# Patient Record
Sex: Male | Born: 1935
Health system: Southern US, Community
[De-identification: ages and names within clinical notes are randomized; demographics above are authoritative.]

## PROBLEM LIST (undated history)

## (undated) DIAGNOSIS — M5416 Radiculopathy, lumbar region: Secondary | ICD-10-CM

## (undated) DIAGNOSIS — I639 Cerebral infarction, unspecified: Secondary | ICD-10-CM

## (undated) DIAGNOSIS — M1990 Unspecified osteoarthritis, unspecified site: Secondary | ICD-10-CM

## (undated) DIAGNOSIS — R296 Repeated falls: Secondary | ICD-10-CM

## (undated) DIAGNOSIS — C61 Malignant neoplasm of prostate: Secondary | ICD-10-CM

## (undated) DIAGNOSIS — R29898 Other symptoms and signs involving the musculoskeletal system: Secondary | ICD-10-CM

## (undated) DIAGNOSIS — Z8679 Personal history of other diseases of the circulatory system: Secondary | ICD-10-CM

## (undated) DIAGNOSIS — F0390 Unspecified dementia without behavioral disturbance: Secondary | ICD-10-CM

## (undated) DIAGNOSIS — M549 Dorsalgia, unspecified: Secondary | ICD-10-CM

## (undated) DIAGNOSIS — I10 Essential (primary) hypertension: Secondary | ICD-10-CM

## (undated) DIAGNOSIS — G8929 Other chronic pain: Secondary | ICD-10-CM

## (undated) DIAGNOSIS — F03A0 Unspecified dementia, mild, without behavioral disturbance, psychotic disturbance, mood disturbance, and anxiety: Secondary | ICD-10-CM

## (undated) DIAGNOSIS — D126 Benign neoplasm of colon, unspecified: Secondary | ICD-10-CM

## (undated) DIAGNOSIS — G629 Polyneuropathy, unspecified: Secondary | ICD-10-CM

## (undated) DIAGNOSIS — Z973 Presence of spectacles and contact lenses: Secondary | ICD-10-CM

## (undated) DIAGNOSIS — E119 Type 2 diabetes mellitus without complications: Secondary | ICD-10-CM

## (undated) HISTORY — DX: Personal history of other diseases of the circulatory system: Z86.79

## (undated) HISTORY — PX: COLONOSCOPY: SHX174

## (undated) HISTORY — DX: Benign neoplasm of colon, unspecified: D12.6

## (undated) HISTORY — DX: Type 2 diabetes mellitus without complications: E11.9

## (undated) HISTORY — PX: OTHER SURGICAL HISTORY: SHX169

---

## 1980-07-07 DIAGNOSIS — I639 Cerebral infarction, unspecified: Secondary | ICD-10-CM

## 1980-07-07 HISTORY — DX: Cerebral infarction, unspecified: I63.9

## 1999-07-08 HISTORY — PX: INSERTION PROSTATE RADIATION SEED: SUR718

## 2000-08-25 ENCOUNTER — Encounter: Admission: RE | Admit: 2000-08-25 | Discharge: 2000-11-23 | Payer: Self-pay | Admitting: Radiation Oncology

## 2000-10-13 ENCOUNTER — Ambulatory Visit (HOSPITAL_BASED_OUTPATIENT_CLINIC_OR_DEPARTMENT_OTHER): Admission: RE | Admit: 2000-10-13 | Discharge: 2000-10-13 | Payer: Self-pay | Admitting: Urology

## 2000-10-13 ENCOUNTER — Encounter: Payer: Self-pay | Admitting: Urology

## 2000-12-08 ENCOUNTER — Ambulatory Visit: Admission: RE | Admit: 2000-12-08 | Discharge: 2001-03-08 | Payer: Self-pay | Admitting: Radiation Oncology

## 2001-08-11 ENCOUNTER — Ambulatory Visit (HOSPITAL_COMMUNITY): Admission: RE | Admit: 2001-08-11 | Discharge: 2001-08-11 | Payer: Self-pay | Admitting: *Deleted

## 2002-09-27 ENCOUNTER — Other Ambulatory Visit: Admission: RE | Admit: 2002-09-27 | Discharge: 2002-09-27 | Payer: Self-pay | Admitting: Dermatology

## 2003-02-20 ENCOUNTER — Other Ambulatory Visit: Admission: RE | Admit: 2003-02-20 | Discharge: 2003-02-20 | Payer: Self-pay | Admitting: Dermatology

## 2003-03-30 ENCOUNTER — Emergency Department (HOSPITAL_COMMUNITY): Admission: EM | Admit: 2003-03-30 | Discharge: 2003-03-30 | Payer: Self-pay | Admitting: Emergency Medicine

## 2003-03-30 ENCOUNTER — Encounter: Payer: Self-pay | Admitting: Emergency Medicine

## 2008-06-22 ENCOUNTER — Ambulatory Visit (HOSPITAL_COMMUNITY): Admission: RE | Admit: 2008-06-22 | Discharge: 2008-06-22 | Payer: Self-pay | Admitting: Internal Medicine

## 2008-06-22 ENCOUNTER — Encounter (INDEPENDENT_AMBULATORY_CARE_PROVIDER_SITE_OTHER): Payer: Self-pay | Admitting: Internal Medicine

## 2008-07-07 HISTORY — PX: EYE SURGERY: SHX253

## 2008-08-22 ENCOUNTER — Ambulatory Visit (HOSPITAL_COMMUNITY): Admission: RE | Admit: 2008-08-22 | Discharge: 2008-08-23 | Payer: Self-pay | Admitting: Ophthalmology

## 2008-12-12 HISTORY — PX: MOHS SURGERY: SUR867

## 2009-11-14 ENCOUNTER — Ambulatory Visit (HOSPITAL_COMMUNITY): Admission: RE | Admit: 2009-11-14 | Discharge: 2009-11-14 | Payer: Self-pay | Admitting: Internal Medicine

## 2010-07-07 DIAGNOSIS — R296 Repeated falls: Secondary | ICD-10-CM

## 2010-07-07 HISTORY — DX: Repeated falls: R29.6

## 2010-10-22 LAB — BASIC METABOLIC PANEL
CO2: 31 mEq/L (ref 19–32)
Calcium: 9.5 mg/dL (ref 8.4–10.5)
Creatinine, Ser: 0.6 mg/dL (ref 0.4–1.5)
GFR calc Af Amer: >60 mL/min (ref >60)
GFR calc non Af Amer: >60 mL/min (ref >60)
Sodium: 132 mEq/L — ABNORMAL LOW (ref 135–145)

## 2010-10-22 LAB — GLUCOSE, CAPILLARY
Glucose-Capillary: 150 mg/dL — ABNORMAL HIGH (ref 70–99)
Glucose-Capillary: 157 mg/dL — ABNORMAL HIGH (ref 70–99)
Glucose-Capillary: 177 mg/dL — ABNORMAL HIGH (ref 70–99)
Glucose-Capillary: 179 mg/dL — ABNORMAL HIGH (ref 70–99)
Glucose-Capillary: 190 mg/dL — ABNORMAL HIGH (ref 70–99)
Glucose-Capillary: 204 mg/dL — ABNORMAL HIGH (ref 70–99)

## 2010-10-22 LAB — CBC
Hemoglobin: 14.8 g/dL (ref 13.0–17.0)
MCHC: 35 g/dL (ref 30.0–36.0)
RBC: 4.04 MIL/uL — ABNORMAL LOW (ref 4.22–5.81)
WBC: 8.4 10*3/uL (ref 4.0–10.5)

## 2010-11-19 NOTE — Op Note (Signed)
Stephen Bates, Stephen Bates              ACCOUNT NO.:  [id number]   MEDICAL RECORD NO.:  [id number]          PATIENT TYPE:  OIB   LOCATION:  5154                         FACILITY:  [facility]   PHYSICIAN:  Beulah Gandy. Ashley Royalty, M.D. DATE OF BIRTH:  04-14-1927   DATE OF PROCEDURE:  08/22/2008  DATE OF DISCHARGE:                               OPERATIVE REPORT   ADMISSION DIAGNOSES:  1. Retained lens material, capsular fibrosis, and posterior membranes      in the right eye.  2. Diabetic retinopathy, right eye.  3. Retinal break at 6 o'clock.   PROCEDURES:  Pars plana vitrectomy with 25-gauge system, retinal  photocoagulation, membrane peel, posterior capsulectomy, removal of  retained lens material in the right eye.   SURGEON:  Beulah Gandy. Ashley Royalty, MD   ASSISTANT:  Rosalie Doctor, MA   ANESTHESIA:  General.   DETAILS:  Usual prep and drape, 25-gauge trocars were placed at 8, 10,  and 2 o'clock with infusion at 8 o'clock.  The lighted pick and the  cutter were placed at 10 and 2 o'clock respectively.  Lens fragments  were removed from around the intraocular lens and some capsular remnants  were removed to improve the visualization of the vitrectomy.  The  footplates of the IOL were found to be intact and tight and secure.  The  vitrectomy was carried posteriorly and large white lens fragments were  encountered.  These were carefully removed under low suction and rapid  cutting.  The vitrectomy was carried down to the vitreous base for 360  degrees.  Lens material along with pigment and vitreous was seen, this  was carefully removed under low suction.  A break was seen at 6 o'clock.  The indirect ophthalmoscope laser was moved into place, 624 burns were  placed around the retinal break and around the retinal periphery.  Power  was 500 milliwatts, 1000 microns each and 0.1 seconds each.  Additional  vitrectomy was carried out in the periphery where membranes were seen.  These were peeled with the  25-gauge pick and removed with the vitreous  cutter.  Some fragments of lens material were seen in these membranes.  A 27-gauge needle was passed through the 12 o'clock limbus to remove  lens remnants from the anterior chamber angle.  All remnants were  removed.  The vitreous infusion fluid was allowed to rinse the vitreous  cavity for several minutes under high mag visualization.  All particles  were removed and none were remaining when the vitrectomy was stopped.  The instruments were removed from the eye and the trocars were removed  from the eye.  The wounds were tested and found to be tight.  Polymyxin  and gentamicin were irrigated into [place] space.  Marcaine was injected  around the globe for postop pain.  Decadron 10 mg was injected into the  lower subconjunctival space.  Polysporin ophthalmic ointment, patch and  shield were placed.  Closing pressure was 10 with a Barraquer tonometer.   COMPLICATIONS:  None.   DURATION:  1 hour.      Beulah Gandy. Ashley Royalty, M.D.  Electronically  Signed     JDM/MEDQ  D:  08/22/2008  T:  08/23/2008  Job:  [id number]

## 2010-11-19 NOTE — Op Note (Signed)
Stephen Bates, Stephen Bates              ACCOUNT NO.:  [id number]   MEDICAL RECORD NO.:  [id number]          PATIENT TYPE:  AMB   LOCATION:  DAY                           FACILITY:  [facility]   PHYSICIAN:  Lionel December, M.D.    DATE OF BIRTH:  1927-05-18   DATE OF PROCEDURE:  DATE OF DISCHARGE:                               OPERATIVE REPORT   PROCEDURE:  Colonoscopy.   INDICATIONS:  Espiridion is a 77 year old Caucasian male who is undergoing  HIDA screening colonoscopy for he had a sister who died of colon  carcinoma in her 51s.  The patient's last exam was in 2003 and was  normal.  Procedure and risks were reviewed with the patient and an  informed consent was obtained.   MEDS FOR CONSCIOUS SEDATION:  Demerol 25 mg IV, and Versed 3 mg IV.   FINDINGS:  Procedure performed in endoscopy suite.  The patient's vital  signs and O2 sat were monitored during the procedure and remained  stable.  The patient was placed in left lateral recumbent position,  rectal examination performed.  No abnormality noted on external or  digital exam.  Pentax videoscope was placed in the rectum and advanced  under vision into the sigmoid colon and beyond.  Preparation was  satisfactory.  Scope was passed into the cecum, which was identified by  appendiceal orifice and ileocecal valve.  There was 6-mm polyp to the  left appendiceal orifice.  It was easily ablated via cold biopsy.  Minimal oozing was observed for few minutes then oozing stopped  completely.  The area was washed as well.  As the scope was withdrawn,  the rest of the colonic mucosa was carefully examined.  There was  another 4-mm polyp at splenic flexure, which was ablated via cold  biopsy.  Mucosa and rest of the colon was normal.  Rectal mucosa  similarly was normal.  The scope was retroflexed to examine anorectal  junction, which was unremarkable.  Endoscope was straightened and  withdrawn.  The patient tolerated the procedure well.   FINAL  DIAGNOSES:  1. Examination performed cecum.  2. Small polyps ablated via cold biopsy, one from the cecum and second      one from splenic flexure.   RECOMMENDATIONS:  Standard instructions given.  He will resume his  aspirin on June 26, 2008.  I will be contacting the patient with  results of biopsy and further recommendations.      Lionel December, M.D.  Electronically Signed     NR/MEDQ  D:  06/22/2008  T:  06/23/2008  Job:  [id number]   cc:   Kingsley Callander. Ouida Sills, MD  Fax: [phone]

## 2010-11-22 NOTE — Op Note (Signed)
[place]  Patient:    Stephen Bates, Stephen Bates                     MRN: [id number] Proc. Date: 10/13/00 Adm. Date:  [id number] Attending:  Nelma Rothman Iii                           Operative Report  PREOPERATIVE DIAGNOSIS:  Adenocarcinoma of the prostate.  POSTOPERATIVE DIAGNOSIS:  Adenocarcinoma of the prostate.  OPERATION PERFORMED:  Transperineal implantation of iodine-125 seeds into the prostate and flexible cystourethroscopy.  SURGEON:  Lucrezia Starch. Ovidio Hanger, M.D.  ASSISTANT:  Wynn Banker, M.D.  ANESTHESIA:  General endotracheal.  ESTIMATED BLOOD LOSS:  15 cc.  DRAINS:  16 French Foley catheter.  COMPLICATIONS:  None.  A total of 112 seeds were delivered with 31 needles at 0.311 mCi per seed and Vicryl strands were utilized.  INDICATIONS FOR PROCEDURE:  The patient is a very nice 77 year old male who presented with an elevated PSA of 6.1 with 11% free to total ratio.  He subsequently underwent transrectal ultrasound and biopsy of the prostate which revealed a Gleason score 5 which was 2+3 adenocarcinoma of the left side of the prostate.  He has considered all options and after understanding the risks, benefits and alternatives and being properly simulated and properly informed, he has elected to proceed with radioactive seed implantation.  DESCRIPTION OF PROCEDURE:  The patient was placed in supine position.  After proper general endotracheal anesthesia, he was placed in dorsal lithotomy position and prepped and draped with Betadine in sterile fashion.  A 16 French Foley catheter was inserted and inflated with 10 cc of contrast solution.  The Siemens transrectal ultrasound probe was placed on the [facility] stabilization device and localized to preplanned coordinates.  A 16 French red rubber catheter was placed into the rectum to vent flatus.  A reference plane of 2 cm from the base was utilized for implantation.  Fluoroscopy  was then placed and localized.  The electronic and mechanical grid were utilized and two holding needles were placed in unused coordinates and proper measurements were taken. Utilizing preplanned coordinates, serial implantations were performed and utilizing the grid technique, a total of 112 seeds were implanted with 31 needles at 0.311 mCi per seed.  We were very comfortable with the seed implantation and localization both fluoroscopically and with ultrasound. Following implantation, the holding needles were removed along with the grid. Static images were obtained fluoroscopically with and without the ultrasound probe in place.  The wound was dressed sterilely and the patient was placed in supine position.  The Foley catheter was removed and scanned for seeds and there were none within it.  Flexible cystourethroscopy was then performed with an Olympus flexible cystourethroscope and he was noted to have mild trilobar hypertrophy, grade 1 trabeculation was noted.  Efflux of clear urine was noted from the normally placed ureteral orifices bilaterally and there were no seeds or strands within the bladder or within the urethra.  Following cystourethroscopy, a new 16 French Foley catheter was inserted, the urine drained clear and the patient was taken to the recovery room stable. DD:  10/13/00 TD:  10/13/00 Job: [id number]

## 2011-03-05 ENCOUNTER — Other Ambulatory Visit (HOSPITAL_COMMUNITY): Payer: Self-pay | Admitting: Internal Medicine

## 2011-03-05 ENCOUNTER — Ambulatory Visit (HOSPITAL_COMMUNITY)
Admission: RE | Admit: 2011-03-05 | Discharge: 2011-03-05 | Disposition: A | Discharge status: Home / Self Care | Payer: Medicare Other | Source: Ambulatory Visit | Attending: Internal Medicine | Admitting: Internal Medicine

## 2011-03-05 DIAGNOSIS — M899 Disorder of bone, unspecified: Secondary | ICD-10-CM | POA: Insufficient documentation

## 2011-03-05 DIAGNOSIS — M25551 Pain in right hip: Secondary | ICD-10-CM

## 2011-03-05 DIAGNOSIS — M545 Low back pain, unspecified: Secondary | ICD-10-CM | POA: Insufficient documentation

## 2011-03-05 DIAGNOSIS — M5137 Other intervertebral disc degeneration, lumbosacral region: Secondary | ICD-10-CM | POA: Insufficient documentation

## 2011-03-05 DIAGNOSIS — M51379 Other intervertebral disc degeneration, lumbosacral region without mention of lumbar back pain or lower extremity pain: Secondary | ICD-10-CM | POA: Insufficient documentation

## 2011-03-05 DIAGNOSIS — M949 Disorder of cartilage, unspecified: Secondary | ICD-10-CM | POA: Insufficient documentation

## 2011-03-05 DIAGNOSIS — M25559 Pain in unspecified hip: Secondary | ICD-10-CM | POA: Insufficient documentation

## 2011-03-27 ENCOUNTER — Ambulatory Visit: Payer: Medicare Other | Admitting: Orthopedic Surgery

## 2011-04-08 ENCOUNTER — Ambulatory Visit: Payer: Medicare Other | Admitting: Orthopedic Surgery

## 2011-07-25 DIAGNOSIS — M5430 Sciatica, unspecified side: Secondary | ICD-10-CM | POA: Diagnosis not present

## 2011-08-07 DIAGNOSIS — G894 Chronic pain syndrome: Secondary | ICD-10-CM | POA: Diagnosis not present

## 2011-08-07 DIAGNOSIS — M5137 Other intervertebral disc degeneration, lumbosacral region: Secondary | ICD-10-CM | POA: Diagnosis not present

## 2011-08-07 DIAGNOSIS — IMO0002 Reserved for concepts with insufficient information to code with codable children: Secondary | ICD-10-CM | POA: Diagnosis not present

## 2011-08-18 ENCOUNTER — Other Ambulatory Visit (HOSPITAL_COMMUNITY): Payer: Self-pay | Admitting: Physical Medicine and Rehabilitation

## 2011-08-18 ENCOUNTER — Ambulatory Visit (HOSPITAL_COMMUNITY)
Admission: RE | Admit: 2011-08-18 | Discharge: 2011-08-18 | Disposition: A | Discharge status: Home / Self Care | Payer: Medicare Other | Source: Ambulatory Visit | Attending: Physical Medicine and Rehabilitation | Admitting: Physical Medicine and Rehabilitation

## 2011-08-18 DIAGNOSIS — M545 Low back pain, unspecified: Secondary | ICD-10-CM | POA: Insufficient documentation

## 2011-08-18 DIAGNOSIS — M169 Osteoarthritis of hip, unspecified: Secondary | ICD-10-CM | POA: Diagnosis not present

## 2011-08-18 DIAGNOSIS — IMO0002 Reserved for concepts with insufficient information to code with codable children: Secondary | ICD-10-CM | POA: Insufficient documentation

## 2011-08-18 DIAGNOSIS — M25559 Pain in unspecified hip: Secondary | ICD-10-CM | POA: Diagnosis not present

## 2011-08-18 DIAGNOSIS — M549 Dorsalgia, unspecified: Secondary | ICD-10-CM

## 2011-08-18 DIAGNOSIS — M25551 Pain in right hip: Secondary | ICD-10-CM

## 2011-08-18 DIAGNOSIS — W19XXXA Unspecified fall, initial encounter: Secondary | ICD-10-CM

## 2011-08-18 DIAGNOSIS — M4310 Spondylolisthesis, site unspecified: Secondary | ICD-10-CM | POA: Diagnosis not present

## 2011-08-18 DIAGNOSIS — M1610 Unilateral primary osteoarthritis, unspecified hip: Secondary | ICD-10-CM | POA: Insufficient documentation

## 2011-08-28 DIAGNOSIS — M48061 Spinal stenosis, lumbar region without neurogenic claudication: Secondary | ICD-10-CM | POA: Diagnosis not present

## 2011-08-28 DIAGNOSIS — M5430 Sciatica, unspecified side: Secondary | ICD-10-CM | POA: Diagnosis not present

## 2011-08-28 DIAGNOSIS — M5137 Other intervertebral disc degeneration, lumbosacral region: Secondary | ICD-10-CM | POA: Diagnosis not present

## 2011-09-02 DIAGNOSIS — I10 Essential (primary) hypertension: Secondary | ICD-10-CM | POA: Diagnosis not present

## 2011-09-08 DIAGNOSIS — M5137 Other intervertebral disc degeneration, lumbosacral region: Secondary | ICD-10-CM | POA: Diagnosis not present

## 2011-09-08 DIAGNOSIS — M76899 Other specified enthesopathies of unspecified lower limb, excluding foot: Secondary | ICD-10-CM | POA: Diagnosis not present

## 2011-09-08 DIAGNOSIS — M5430 Sciatica, unspecified side: Secondary | ICD-10-CM | POA: Diagnosis not present

## 2011-09-11 DIAGNOSIS — M76899 Other specified enthesopathies of unspecified lower limb, excluding foot: Secondary | ICD-10-CM | POA: Diagnosis not present

## 2011-09-11 DIAGNOSIS — M5430 Sciatica, unspecified side: Secondary | ICD-10-CM | POA: Diagnosis not present

## 2011-09-11 DIAGNOSIS — E119 Type 2 diabetes mellitus without complications: Secondary | ICD-10-CM | POA: Diagnosis not present

## 2011-09-30 DIAGNOSIS — M545 Low back pain, unspecified: Secondary | ICD-10-CM | POA: Diagnosis not present

## 2011-09-30 DIAGNOSIS — IMO0002 Reserved for concepts with insufficient information to code with codable children: Secondary | ICD-10-CM | POA: Diagnosis not present

## 2011-11-04 DIAGNOSIS — M545 Low back pain, unspecified: Secondary | ICD-10-CM | POA: Diagnosis not present

## 2011-11-04 DIAGNOSIS — IMO0002 Reserved for concepts with insufficient information to code with codable children: Secondary | ICD-10-CM | POA: Diagnosis not present

## 2011-11-04 DIAGNOSIS — M47817 Spondylosis without myelopathy or radiculopathy, lumbosacral region: Secondary | ICD-10-CM | POA: Diagnosis not present

## 2011-12-02 ENCOUNTER — Encounter (INDEPENDENT_AMBULATORY_CARE_PROVIDER_SITE_OTHER): Payer: Self-pay

## 2011-12-02 DIAGNOSIS — IMO0002 Reserved for concepts with insufficient information to code with codable children: Secondary | ICD-10-CM | POA: Diagnosis not present

## 2011-12-02 DIAGNOSIS — M545 Low back pain, unspecified: Secondary | ICD-10-CM | POA: Diagnosis not present

## 2011-12-12 DIAGNOSIS — E119 Type 2 diabetes mellitus without complications: Secondary | ICD-10-CM | POA: Diagnosis not present

## 2011-12-12 DIAGNOSIS — Z79899 Other long term (current) drug therapy: Secondary | ICD-10-CM | POA: Diagnosis not present

## 2011-12-12 DIAGNOSIS — Z125 Encounter for screening for malignant neoplasm of prostate: Secondary | ICD-10-CM | POA: Diagnosis not present

## 2011-12-12 DIAGNOSIS — I10 Essential (primary) hypertension: Secondary | ICD-10-CM | POA: Diagnosis not present

## 2011-12-12 DIAGNOSIS — I6789 Other cerebrovascular disease: Secondary | ICD-10-CM | POA: Diagnosis not present

## 2011-12-18 DIAGNOSIS — I6789 Other cerebrovascular disease: Secondary | ICD-10-CM | POA: Diagnosis not present

## 2011-12-18 DIAGNOSIS — E119 Type 2 diabetes mellitus without complications: Secondary | ICD-10-CM | POA: Diagnosis not present

## 2011-12-18 DIAGNOSIS — I10 Essential (primary) hypertension: Secondary | ICD-10-CM | POA: Diagnosis not present

## 2011-12-18 DIAGNOSIS — I4510 Unspecified right bundle-branch block: Secondary | ICD-10-CM | POA: Diagnosis not present

## 2012-02-04 DIAGNOSIS — Z85828 Personal history of other malignant neoplasm of skin: Secondary | ICD-10-CM | POA: Diagnosis not present

## 2012-02-04 DIAGNOSIS — L570 Actinic keratosis: Secondary | ICD-10-CM | POA: Diagnosis not present

## 2012-04-08 DIAGNOSIS — E119 Type 2 diabetes mellitus without complications: Secondary | ICD-10-CM | POA: Diagnosis not present

## 2012-04-15 DIAGNOSIS — E119 Type 2 diabetes mellitus without complications: Secondary | ICD-10-CM | POA: Diagnosis not present

## 2012-04-15 DIAGNOSIS — I10 Essential (primary) hypertension: Secondary | ICD-10-CM | POA: Diagnosis not present

## 2012-05-10 DIAGNOSIS — Z23 Encounter for immunization: Secondary | ICD-10-CM | POA: Diagnosis not present

## 2012-05-24 DIAGNOSIS — R972 Elevated prostate specific antigen [PSA]: Secondary | ICD-10-CM | POA: Diagnosis not present

## 2012-05-24 DIAGNOSIS — N320 Bladder-neck obstruction: Secondary | ICD-10-CM | POA: Diagnosis not present

## 2012-05-24 DIAGNOSIS — C61 Malignant neoplasm of prostate: Secondary | ICD-10-CM | POA: Diagnosis not present

## 2012-06-22 DIAGNOSIS — C4442 Squamous cell carcinoma of skin of scalp and neck: Secondary | ICD-10-CM | POA: Diagnosis not present

## 2012-06-22 DIAGNOSIS — D485 Neoplasm of uncertain behavior of skin: Secondary | ICD-10-CM | POA: Diagnosis not present

## 2012-06-22 DIAGNOSIS — L570 Actinic keratosis: Secondary | ICD-10-CM | POA: Diagnosis not present

## 2012-07-20 DIAGNOSIS — C4492 Squamous cell carcinoma of skin, unspecified: Secondary | ICD-10-CM | POA: Diagnosis not present

## 2012-07-20 DIAGNOSIS — H1130 Conjunctival hemorrhage, unspecified eye: Secondary | ICD-10-CM | POA: Diagnosis not present

## 2012-07-20 DIAGNOSIS — C4442 Squamous cell carcinoma of skin of scalp and neck: Secondary | ICD-10-CM | POA: Diagnosis not present

## 2012-08-12 DIAGNOSIS — E119 Type 2 diabetes mellitus without complications: Secondary | ICD-10-CM | POA: Diagnosis not present

## 2012-09-09 DIAGNOSIS — E119 Type 2 diabetes mellitus without complications: Secondary | ICD-10-CM | POA: Diagnosis not present

## 2012-09-09 DIAGNOSIS — R0602 Shortness of breath: Secondary | ICD-10-CM | POA: Diagnosis not present

## 2012-09-13 ENCOUNTER — Ambulatory Visit (HOSPITAL_COMMUNITY)
Admission: RE | Admit: 2012-09-13 | Discharge: 2012-09-13 | Disposition: A | Discharge status: Home / Self Care | Payer: Medicare Other | Source: Ambulatory Visit | Attending: Internal Medicine | Admitting: Internal Medicine

## 2012-09-13 ENCOUNTER — Other Ambulatory Visit (HOSPITAL_COMMUNITY): Payer: Self-pay | Admitting: Internal Medicine

## 2012-09-13 DIAGNOSIS — R0602 Shortness of breath: Secondary | ICD-10-CM | POA: Diagnosis not present

## 2012-09-23 ENCOUNTER — Encounter (INDEPENDENT_AMBULATORY_CARE_PROVIDER_SITE_OTHER): Payer: Medicare Other | Admitting: Ophthalmology

## 2012-10-13 ENCOUNTER — Ambulatory Visit (INDEPENDENT_AMBULATORY_CARE_PROVIDER_SITE_OTHER): Payer: Medicare Other | Admitting: Ophthalmology

## 2012-10-13 DIAGNOSIS — H2700 Aphakia, unspecified eye: Secondary | ICD-10-CM

## 2012-10-14 ENCOUNTER — Ambulatory Visit (INDEPENDENT_AMBULATORY_CARE_PROVIDER_SITE_OTHER): Payer: Medicare Other | Admitting: Ophthalmology

## 2012-12-07 DIAGNOSIS — L570 Actinic keratosis: Secondary | ICD-10-CM | POA: Diagnosis not present

## 2012-12-07 DIAGNOSIS — L408 Other psoriasis: Secondary | ICD-10-CM | POA: Diagnosis not present

## 2012-12-07 DIAGNOSIS — D485 Neoplasm of uncertain behavior of skin: Secondary | ICD-10-CM | POA: Diagnosis not present

## 2012-12-07 DIAGNOSIS — Z85828 Personal history of other malignant neoplasm of skin: Secondary | ICD-10-CM | POA: Diagnosis not present

## 2012-12-07 DIAGNOSIS — L905 Scar conditions and fibrosis of skin: Secondary | ICD-10-CM | POA: Diagnosis not present

## 2012-12-07 DIAGNOSIS — L98499 Non-pressure chronic ulcer of skin of other sites with unspecified severity: Secondary | ICD-10-CM | POA: Diagnosis not present

## 2012-12-10 ENCOUNTER — Encounter (HOSPITAL_COMMUNITY): Payer: Self-pay | Admitting: *Deleted

## 2012-12-10 ENCOUNTER — Emergency Department (HOSPITAL_COMMUNITY)
Admission: EM | Admit: 2012-12-10 | Discharge: 2012-12-10 | Disposition: A | Discharge status: Home / Self Care | Payer: Medicare Other | Attending: Emergency Medicine | Admitting: Emergency Medicine

## 2012-12-10 ENCOUNTER — Emergency Department (HOSPITAL_COMMUNITY): Payer: Medicare Other

## 2012-12-10 DIAGNOSIS — S298XXA Other specified injuries of thorax, initial encounter: Secondary | ICD-10-CM | POA: Diagnosis not present

## 2012-12-10 DIAGNOSIS — S51009A Unspecified open wound of unspecified elbow, initial encounter: Secondary | ICD-10-CM | POA: Insufficient documentation

## 2012-12-10 DIAGNOSIS — S51011A Laceration without foreign body of right elbow, initial encounter: Secondary | ICD-10-CM

## 2012-12-10 DIAGNOSIS — Z8546 Personal history of malignant neoplasm of prostate: Secondary | ICD-10-CM | POA: Diagnosis not present

## 2012-12-10 DIAGNOSIS — Z87891 Personal history of nicotine dependence: Secondary | ICD-10-CM | POA: Insufficient documentation

## 2012-12-10 DIAGNOSIS — S0100XA Unspecified open wound of scalp, initial encounter: Secondary | ICD-10-CM | POA: Diagnosis not present

## 2012-12-10 DIAGNOSIS — I10 Essential (primary) hypertension: Secondary | ICD-10-CM | POA: Diagnosis not present

## 2012-12-10 DIAGNOSIS — Y939 Activity, unspecified: Secondary | ICD-10-CM | POA: Insufficient documentation

## 2012-12-10 DIAGNOSIS — IMO0002 Reserved for concepts with insufficient information to code with codable children: Secondary | ICD-10-CM | POA: Diagnosis not present

## 2012-12-10 DIAGNOSIS — Y92009 Unspecified place in unspecified non-institutional (private) residence as the place of occurrence of the external cause: Secondary | ICD-10-CM | POA: Insufficient documentation

## 2012-12-10 DIAGNOSIS — Z23 Encounter for immunization: Secondary | ICD-10-CM | POA: Insufficient documentation

## 2012-12-10 DIAGNOSIS — Z79899 Other long term (current) drug therapy: Secondary | ICD-10-CM | POA: Diagnosis not present

## 2012-12-10 DIAGNOSIS — S51012A Laceration without foreign body of left elbow, initial encounter: Secondary | ICD-10-CM

## 2012-12-10 DIAGNOSIS — Z7982 Long term (current) use of aspirin: Secondary | ICD-10-CM | POA: Diagnosis not present

## 2012-12-10 DIAGNOSIS — S0101XA Laceration without foreign body of scalp, initial encounter: Secondary | ICD-10-CM

## 2012-12-10 DIAGNOSIS — W1809XA Striking against other object with subsequent fall, initial encounter: Secondary | ICD-10-CM | POA: Insufficient documentation

## 2012-12-10 HISTORY — DX: Malignant neoplasm of prostate: C61

## 2012-12-10 HISTORY — DX: Essential (primary) hypertension: I10

## 2012-12-10 MED ORDER — LIDOCAINE HCL (PF) 2 % IJ SOLN
INTRAMUSCULAR | Status: AC
  Administered 2012-12-10: 5 mL
  Filled 2012-12-10: qty 10

## 2012-12-10 MED ORDER — TETANUS-DIPHTH-ACELL PERTUSSIS 5-2.5-18.5 LF-MCG/0.5 IM SUSP
0.5000 mL | Freq: Once | INTRAMUSCULAR | Status: AC
  Administered 2012-12-10: 0.5 mL via INTRAMUSCULAR
  Filled 2012-12-10: qty 0.5

## 2012-12-10 NOTE — ED Notes (Signed)
States he takes 81mg  ASA everyday

## 2012-12-10 NOTE — ED Notes (Signed)
Reports tripped and fell on a "high" part of his driveway, falling backwards; abrasion to left elbow noted - cleansed and dressed with telfa and kling; skin tear to right elbow.  Laceration to back of head, bleeding controlled.  Denies LOC.  A&ox4; answers questions appropriately.

## 2012-12-10 NOTE — ED Provider Notes (Signed)
History     CSN: [id number]  Arrival date & time 12/10/12  1628   First MD Initiated Contact with Patient 12/10/12 1704      Chief Complaint  Patient presents with  . Head Injury    (Consider location/radiation/quality/duration/timing/severity/associated sxs/prior treatment) Patient is a 79 y.o. male presenting with head injury.  Head Injury Head/neck injury location: Pt tripped on a high place in his driveway and fell backwards, suffering a laceration of the occipital scalp and skin tears of both elbows.  Time since incident: Approximately one hour. Mechanism of injury: fall   Pain details:    Quality:  Dull   Radiates to: Does not radiate.   Severity:  Mild   Duration: Appx one hour.    Timing:  Constant   Progression:  Unchanged Chronicity:  New Relieved by:  Nothing Worsened by:  Nothing tried Ineffective treatments:  None tried Risk factors: being elderly     Past Medical History  Diagnosis Date  . Hypertension   . Prostate cancer     History reviewed. No pertinent past surgical history.  History reviewed. No pertinent family history.  History  Substance Use Topics  . Smoking status: Former Games developer  . Smokeless tobacco: Not on file  . Alcohol Use: Yes      Review of Systems  Constitutional: Negative.  Negative for fever and chills.  HENT: Negative.   Eyes: Negative.   Respiratory: Negative.   Cardiovascular: Negative.   Gastrointestinal: Negative.   Genitourinary: Negative.   Musculoskeletal: Positive for back pain.  Skin: Positive for wound.  Neurological: Negative.        No loss of consciousness.  Psychiatric/Behavioral: Negative.     Allergies  Review of patient's allergies indicates no known allergies.  Home Medications   Current Outpatient Rx  Name  Route  Sig  Dispense  Refill  . aspirin EC 81 MG tablet   Oral   Take 81 mg by mouth daily.         . cholecalciferol (VITAMIN D) 1000 UNITS tablet   Oral   Take 1,000 Units by  mouth daily.         . hydrochlorothiazide (HYDRODIURIL) 25 MG tablet   Oral   Take 25 mg by mouth daily.         Marland Kitchen olmesartan (BENICAR) 40 MG tablet   Oral   Take 40 mg by mouth daily.         Bertram Gala Glycol-Propyl Glycol (LUBRICANT EYE DROPS) 0.4-0.3 % SOLN   Ophthalmic   Apply 1 drop to eye daily as needed (for dry eye relief).         . tamsulosin (FLOMAX) 0.4 MG CAPS   Oral   Take 0.4 mg by mouth at bedtime.            BP 151/57  Pulse 68  Temp(Src) 98.8 F (37.1 C) (Oral)  Resp 20  Ht 5\' 4"  (1.626 m)  Wt 140 lb (63.504 kg)  BMI 24.02 kg/m2  SpO2 98%  Physical Exam  Nursing note and vitals reviewed. Constitutional: He is oriented to person, place, and time. He appears well-developed and well-nourished.  In mild distress with lacerations of scalp, skin tears on both elbows.  HENT:  Head: Normocephalic.  Right Ear: External ear normal.  Left Ear: External ear normal.  Mouth/Throat: Oropharynx is clear and moist.  He has a 4 cm jagged laceration of the occipital scalp, in the sagittal plane, no foreign body,  no skull defect.  Eyes: Conjunctivae and EOM are normal. Pupils are equal, round, and reactive to light.  Neck: Normal range of motion. Neck supple.  Cardiovascular: Normal rate, regular rhythm and normal heart sounds.   Pulmonary/Chest: Effort normal and breath sounds normal.  Abdominal: Soft. Bowel sounds are normal.  Musculoskeletal: Normal range of motion. He exhibits no edema and no tenderness.  Neurological: He is alert and oriented to person, place, and time.  No sensory or motor deficit.  Skin:  He has superficial skin tears on both elbows, each about 2 cm long, no foreign body in the wounds. The elbows have full ROM, no bony deformity.  He has intact pulses, sensation and tendon function in both arms.  Psychiatric: He has a normal mood and affect. His behavior is normal.    ED Course  LACERATION REPAIR Date/Time: 12/11/2012 11:34  AM Performed by: Osvaldo Human Authorized by: Osvaldo Human Consent: Verbal consent obtained. Risks and benefits: risks, benefits and alternatives were discussed Consent given by: patient Patient understanding: patient states understanding of the procedure being performed Patient consent: the patient's understanding of the procedure matches consent given Site marked: the operative site was not marked Patient identity confirmed: verbally with patient Body area: head/neck Location details: scalp Laceration length: 4 cm Foreign bodies: no foreign bodies Tendon involvement: none Nerve involvement: none Vascular damage: no Anesthesia: local infiltration Local anesthetic: lidocaine 2% without epinephrine Patient sedated: no Preparation: Patient was prepped and draped in the usual sterile fashion. Irrigation solution: saline Irrigation method: tap Amount of cleaning: standard Debridement: minimal (The scalp had a skin tear that was a part of the laceration.  The dead skin overlying the laceration was debrided off, to expose the wound.) Degree of undermining: none Skin closure: staples Number of sutures: 13 Approximation difficulty: complex Patient tolerance: Patient tolerated the procedure well with no immediate complications. Comments: Pt's elbow skin tears were washed with saline, the skin tear flaps laid over the underlying skin, and were Steri-Stripped and then dressed with 4 x 4's and Kling.   (including critical care time)  Labs Reviewed - No data to display Dg Chest 2 View  12/10/2012   *RADIOLOGY REPORT*  Clinical Data: Fall.  Occipital scalp laceration.  Upper back pain.  CHEST - 2 VIEW  Comparison: 09/13/2012.  Findings: Chronic scarring at the left lung base.  Mild atelectasis at the right lung base.  No displaced rib fractures. No pneumothorax.  Cardiopericardial silhouette within normal limits. Aortic arch atherosclerosis.  Visualized thoracic vertebral body height  within normal limits.  IMPRESSION: No acute cardiopulmonary disease.   Original Report Authenticated By: Andreas Newport, M.D.   Ct Head Wo Contrast  12/10/2012   *RADIOLOGY REPORT*  Clinical Data: Fall.  Occipital scalp laceration.  No loss of consciousness.  History of prostate cancer.  CT HEAD WITHOUT CONTRAST  Technique:  Contiguous axial images were obtained from the base of the skull through the vertex without contrast.  Comparison: None.  Findings: There are scalp staples present over the vertex.  There is no underlying skull fracture. No mass lesion, mass effect, midline shift, hydrocephalus, hemorrhage.  No acute territorial cortical ischemia/infarct. Atrophy and chronic ischemic white matter disease is present.  Tiny lacunar infarcts are present in the right cerebellar hemisphere, likely chronic.  IMPRESSION:  1.  No acute intracranial abnormality.  Atrophy and chronic ischemic white matter disease.  Right cerebellar lacunar infarcts are likely chronic. 2.  Posterior scalp hematoma and closure without  underlying skull fracture.   Original Report Authenticated By: Andreas Newport, M.D.   After his laceration repair pt had CT of the head and chest x-rays, both of which were negative.  He was given TDAP as he could not recall his last tetanus booster.  When his x-rays were found to be negative, he was released.   1. Fall at home, initial encounter   2. Occipital scalp laceration, initial encounter   3. Skin tear of elbow without complication, left, initial encounter   4. Skin tear of elbow without complication, right, initial encounter           Carleene Cooper III, MD 12/11/12 [id number]

## 2012-12-10 NOTE — ED Notes (Signed)
Pt tripped at his apartment, fell and hit head on asphalt at apartment, denies LOC

## 2012-12-14 DIAGNOSIS — I10 Essential (primary) hypertension: Secondary | ICD-10-CM | POA: Diagnosis not present

## 2012-12-14 DIAGNOSIS — E119 Type 2 diabetes mellitus without complications: Secondary | ICD-10-CM | POA: Diagnosis not present

## 2012-12-14 DIAGNOSIS — Z125 Encounter for screening for malignant neoplasm of prostate: Secondary | ICD-10-CM | POA: Diagnosis not present

## 2012-12-14 DIAGNOSIS — Z79899 Other long term (current) drug therapy: Secondary | ICD-10-CM | POA: Diagnosis not present

## 2012-12-20 DIAGNOSIS — Z4802 Encounter for removal of sutures: Secondary | ICD-10-CM | POA: Diagnosis not present

## 2012-12-23 DIAGNOSIS — E119 Type 2 diabetes mellitus without complications: Secondary | ICD-10-CM | POA: Diagnosis not present

## 2012-12-23 DIAGNOSIS — Z125 Encounter for screening for malignant neoplasm of prostate: Secondary | ICD-10-CM | POA: Diagnosis not present

## 2012-12-23 DIAGNOSIS — I10 Essential (primary) hypertension: Secondary | ICD-10-CM | POA: Diagnosis not present

## 2012-12-23 DIAGNOSIS — Z79899 Other long term (current) drug therapy: Secondary | ICD-10-CM | POA: Diagnosis not present

## 2012-12-30 DIAGNOSIS — I4510 Unspecified right bundle-branch block: Secondary | ICD-10-CM | POA: Diagnosis not present

## 2012-12-30 DIAGNOSIS — E119 Type 2 diabetes mellitus without complications: Secondary | ICD-10-CM | POA: Diagnosis not present

## 2012-12-30 DIAGNOSIS — C61 Malignant neoplasm of prostate: Secondary | ICD-10-CM | POA: Diagnosis not present

## 2012-12-30 DIAGNOSIS — E871 Hypo-osmolality and hyponatremia: Secondary | ICD-10-CM | POA: Diagnosis not present

## 2013-02-09 DIAGNOSIS — I8310 Varicose veins of unspecified lower extremity with inflammation: Secondary | ICD-10-CM | POA: Diagnosis not present

## 2013-02-09 DIAGNOSIS — L98499 Non-pressure chronic ulcer of skin of other sites with unspecified severity: Secondary | ICD-10-CM | POA: Diagnosis not present

## 2013-02-09 DIAGNOSIS — I872 Venous insufficiency (chronic) (peripheral): Secondary | ICD-10-CM | POA: Diagnosis not present

## 2013-03-15 DIAGNOSIS — L259 Unspecified contact dermatitis, unspecified cause: Secondary | ICD-10-CM | POA: Diagnosis not present

## 2013-03-15 DIAGNOSIS — L578 Other skin changes due to chronic exposure to nonionizing radiation: Secondary | ICD-10-CM | POA: Diagnosis not present

## 2013-03-15 DIAGNOSIS — L570 Actinic keratosis: Secondary | ICD-10-CM | POA: Diagnosis not present

## 2013-03-15 DIAGNOSIS — D485 Neoplasm of uncertain behavior of skin: Secondary | ICD-10-CM | POA: Diagnosis not present

## 2013-05-02 DIAGNOSIS — E119 Type 2 diabetes mellitus without complications: Secondary | ICD-10-CM | POA: Diagnosis not present

## 2013-05-02 DIAGNOSIS — I10 Essential (primary) hypertension: Secondary | ICD-10-CM | POA: Diagnosis not present

## 2013-05-02 DIAGNOSIS — Z79899 Other long term (current) drug therapy: Secondary | ICD-10-CM | POA: Diagnosis not present

## 2013-05-05 ENCOUNTER — Ambulatory Visit (INDEPENDENT_AMBULATORY_CARE_PROVIDER_SITE_OTHER): Payer: Medicare Other | Admitting: Otolaryngology

## 2013-05-05 ENCOUNTER — Encounter (INDEPENDENT_AMBULATORY_CARE_PROVIDER_SITE_OTHER): Payer: Self-pay

## 2013-05-05 DIAGNOSIS — H903 Sensorineural hearing loss, bilateral: Secondary | ICD-10-CM

## 2013-05-05 DIAGNOSIS — H6040 Cholesteatoma of external ear, unspecified ear: Secondary | ICD-10-CM | POA: Diagnosis not present

## 2013-05-05 DIAGNOSIS — H6120 Impacted cerumen, unspecified ear: Secondary | ICD-10-CM | POA: Diagnosis not present

## 2013-05-05 DIAGNOSIS — H9209 Otalgia, unspecified ear: Secondary | ICD-10-CM | POA: Diagnosis not present

## 2013-05-05 DIAGNOSIS — Z23 Encounter for immunization: Secondary | ICD-10-CM | POA: Diagnosis not present

## 2013-05-06 ENCOUNTER — Other Ambulatory Visit (INDEPENDENT_AMBULATORY_CARE_PROVIDER_SITE_OTHER): Payer: Self-pay | Admitting: Otolaryngology

## 2013-05-06 DIAGNOSIS — H6042 Cholesteatoma of left external ear: Secondary | ICD-10-CM

## 2013-05-10 ENCOUNTER — Ambulatory Visit (HOSPITAL_COMMUNITY)
Admission: RE | Admit: 2013-05-10 | Discharge: 2013-05-10 | Disposition: A | Discharge status: Home / Self Care | Payer: Medicare Other | Source: Ambulatory Visit | Attending: Otolaryngology | Admitting: Otolaryngology

## 2013-05-10 DIAGNOSIS — R9389 Abnormal findings on diagnostic imaging of other specified body structures: Secondary | ICD-10-CM | POA: Diagnosis not present

## 2013-05-10 DIAGNOSIS — H9190 Unspecified hearing loss, unspecified ear: Secondary | ICD-10-CM | POA: Diagnosis not present

## 2013-05-10 DIAGNOSIS — H938X9 Other specified disorders of ear, unspecified ear: Secondary | ICD-10-CM | POA: Insufficient documentation

## 2013-05-10 DIAGNOSIS — H6042 Cholesteatoma of left external ear: Secondary | ICD-10-CM

## 2013-05-19 ENCOUNTER — Ambulatory Visit (INDEPENDENT_AMBULATORY_CARE_PROVIDER_SITE_OTHER): Payer: Medicare Other | Admitting: Otolaryngology

## 2013-05-19 DIAGNOSIS — H6040 Cholesteatoma of external ear, unspecified ear: Secondary | ICD-10-CM

## 2013-05-19 DIAGNOSIS — R5381 Other malaise: Secondary | ICD-10-CM | POA: Diagnosis not present

## 2013-05-19 DIAGNOSIS — W19XXXA Unspecified fall, initial encounter: Secondary | ICD-10-CM | POA: Diagnosis not present

## 2013-05-23 DIAGNOSIS — R5381 Other malaise: Secondary | ICD-10-CM | POA: Diagnosis not present

## 2013-05-23 DIAGNOSIS — W19XXXA Unspecified fall, initial encounter: Secondary | ICD-10-CM | POA: Diagnosis not present

## 2013-06-08 ENCOUNTER — Encounter (HOSPITAL_BASED_OUTPATIENT_CLINIC_OR_DEPARTMENT_OTHER): Payer: Self-pay | Admitting: *Deleted

## 2013-06-08 NOTE — Progress Notes (Signed)
Pt did not want to talk-talked with wife-he had a fall 6/14 hit the back of his head-had stitches-having hearing problems lt ear-denies having any cardiac problems-will go to [facility] for ekg and bmet-daughter coming with them [facility]

## 2013-06-09 ENCOUNTER — Encounter (HOSPITAL_COMMUNITY)
Admission: RE | Admit: 2013-06-09 | Discharge: 2013-06-09 | Disposition: A | Discharge status: Home / Self Care | Payer: Medicare Other | Source: Ambulatory Visit | Attending: Otolaryngology | Admitting: Otolaryngology

## 2013-06-09 ENCOUNTER — Other Ambulatory Visit: Payer: Self-pay

## 2013-06-09 DIAGNOSIS — Z01812 Encounter for preprocedural laboratory examination: Secondary | ICD-10-CM | POA: Insufficient documentation

## 2013-06-09 DIAGNOSIS — Z01818 Encounter for other preprocedural examination: Secondary | ICD-10-CM | POA: Insufficient documentation

## 2013-06-09 DIAGNOSIS — Z01810 Encounter for preprocedural cardiovascular examination: Secondary | ICD-10-CM | POA: Insufficient documentation

## 2013-06-09 LAB — BASIC METABOLIC PANEL
BUN: 6 mg/dL (ref 6–23)
CO2: 29 mEq/L (ref 19–32)
Calcium: 10.2 mg/dL (ref 8.4–10.5)
Chloride: 87 mEq/L — ABNORMAL LOW (ref 96–112)
Creatinine, Ser: 0.61 mg/dL (ref 0.50–1.35)
GFR calc Af Amer: >90 mL/min (ref >90)
Glucose, Bld: 219 mg/dL — ABNORMAL HIGH (ref 70–99)

## 2013-06-09 NOTE — Progress Notes (Signed)
Reviewed with dr crews-ok for here 

## 2013-06-14 ENCOUNTER — Encounter (HOSPITAL_BASED_OUTPATIENT_CLINIC_OR_DEPARTMENT_OTHER)
Admission: RE | Disposition: A | Discharge status: Home / Self Care | Payer: Self-pay | Source: Ambulatory Visit | Attending: Otolaryngology

## 2013-06-14 ENCOUNTER — Encounter (HOSPITAL_BASED_OUTPATIENT_CLINIC_OR_DEPARTMENT_OTHER): Payer: Self-pay | Admitting: Anesthesiology

## 2013-06-14 ENCOUNTER — Telehealth (INDEPENDENT_AMBULATORY_CARE_PROVIDER_SITE_OTHER): Payer: Self-pay | Admitting: *Deleted

## 2013-06-14 ENCOUNTER — Ambulatory Visit (HOSPITAL_BASED_OUTPATIENT_CLINIC_OR_DEPARTMENT_OTHER): Payer: Medicare Other | Admitting: Anesthesiology

## 2013-06-14 ENCOUNTER — Ambulatory Visit (HOSPITAL_BASED_OUTPATIENT_CLINIC_OR_DEPARTMENT_OTHER)
Admission: RE | Admit: 2013-06-14 | Discharge: 2013-06-14 | Disposition: A | Discharge status: Home / Self Care | Payer: Medicare Other | Source: Ambulatory Visit | Attending: Otolaryngology | Admitting: Otolaryngology

## 2013-06-14 ENCOUNTER — Encounter (HOSPITAL_BASED_OUTPATIENT_CLINIC_OR_DEPARTMENT_OTHER): Payer: Medicare Other | Admitting: Anesthesiology

## 2013-06-14 DIAGNOSIS — H6040 Cholesteatoma of external ear, unspecified ear: Secondary | ICD-10-CM | POA: Insufficient documentation

## 2013-06-14 DIAGNOSIS — H6042 Cholesteatoma of left external ear: Secondary | ICD-10-CM

## 2013-06-14 DIAGNOSIS — I10 Essential (primary) hypertension: Secondary | ICD-10-CM | POA: Diagnosis not present

## 2013-06-14 DIAGNOSIS — Z87891 Personal history of nicotine dependence: Secondary | ICD-10-CM | POA: Insufficient documentation

## 2013-06-14 DIAGNOSIS — H61329 Acquired stenosis of external ear canal secondary to inflammation and infection, unspecified ear: Secondary | ICD-10-CM | POA: Diagnosis not present

## 2013-06-14 DIAGNOSIS — H7190 Unspecified cholesteatoma, unspecified ear: Secondary | ICD-10-CM | POA: Diagnosis not present

## 2013-06-14 DIAGNOSIS — Z8673 Personal history of transient ischemic attack (TIA), and cerebral infarction without residual deficits: Secondary | ICD-10-CM | POA: Insufficient documentation

## 2013-06-14 HISTORY — DX: Presence of spectacles and contact lenses: Z97.3

## 2013-06-14 HISTORY — DX: Cerebral infarction, unspecified: I63.9

## 2013-06-14 HISTORY — DX: Unspecified osteoarthritis, unspecified site: M19.90

## 2013-06-14 HISTORY — DX: Other symptoms and signs involving the musculoskeletal system: R29.898

## 2013-06-14 SURGERY — EAR MEATOPLASTY WITH FULL THICKNESS SKIN GRAFT
Anesthesia: General | Site: Ear | Laterality: Left

## 2013-06-14 MED ORDER — LIDOCAINE-EPINEPHRINE 1 %-1:100000 IJ SOLN
INTRAMUSCULAR | Status: DC | PRN
  Administered 2013-06-14: 5 mL

## 2013-06-14 MED ORDER — EPHEDRINE SULFATE 50 MG/ML IJ SOLN
INTRAMUSCULAR | Status: DC | PRN
  Administered 2013-06-14: 10 mg via INTRAVENOUS

## 2013-06-14 MED ORDER — PROPOFOL 10 MG/ML IV BOLUS
INTRAVENOUS | Status: DC | PRN
  Administered 2013-06-14: 50 mg via INTRAVENOUS
  Administered 2013-06-14: 120 mg via INTRAVENOUS

## 2013-06-14 MED ORDER — OXYCODONE HCL 5 MG/5ML PO SOLN: 5.0000 mg | Freq: Once | ORAL | Status: DC | PRN

## 2013-06-14 MED ORDER — OXYMETAZOLINE HCL 0.05 % NA SOLN
NASAL | Status: AC
  Filled 2013-06-14: qty 15

## 2013-06-14 MED ORDER — HYDROCODONE-ACETAMINOPHEN 5-325 MG PO TABS: 1.0000 | ORAL_TABLET | ORAL | Status: DC | PRN

## 2013-06-14 MED ORDER — FENTANYL CITRATE 0.05 MG/ML IJ SOLN: 50.0000 ug | Freq: Once | INTRAMUSCULAR | Status: DC

## 2013-06-14 MED ORDER — LIDOCAINE-EPINEPHRINE 1 %-1:100000 IJ SOLN
INTRAMUSCULAR | Status: AC
  Filled 2013-06-14: qty 1

## 2013-06-14 MED ORDER — CIPROFLOXACIN-DEXAMETHASONE 0.3-0.1 % OT SUSP
OTIC | Status: AC
  Filled 2013-06-14: qty 7.5

## 2013-06-14 MED ORDER — CIPROFLOXACIN-DEXAMETHASONE 0.3-0.1 % OT SUSP: 4.0000 [drp] | Freq: Two times a day (BID) | OTIC | Status: AC

## 2013-06-14 MED ORDER — BACITRACIN ZINC 500 UNIT/GM EX OINT
TOPICAL_OINTMENT | CUTANEOUS | Status: AC
  Filled 2013-06-14: qty 28.35

## 2013-06-14 MED ORDER — BACITRACIN ZINC 500 UNIT/GM EX OINT
TOPICAL_OINTMENT | CUTANEOUS | Status: DC | PRN
  Administered 2013-06-14: 1 via TOPICAL

## 2013-06-14 MED ORDER — LIDOCAINE HCL (CARDIAC) 20 MG/ML IV SOLN
INTRAVENOUS | Status: DC | PRN
  Administered 2013-06-14: 50 mg via INTRAVENOUS

## 2013-06-14 MED ORDER — LACTATED RINGERS IV SOLN
INTRAVENOUS | Status: DC
  Administered 2013-06-14 (×2): via INTRAVENOUS

## 2013-06-14 MED ORDER — CIPROFLOXACIN-DEXAMETHASONE 0.3-0.1 % OT SUSP
OTIC | Status: DC | PRN
  Administered 2013-06-14: 4 [drp] via OTIC

## 2013-06-14 MED ORDER — FENTANYL CITRATE 0.05 MG/ML IJ SOLN: 25.0000 ug | INTRAMUSCULAR | Status: DC | PRN

## 2013-06-14 MED ORDER — ONDANSETRON HCL 4 MG/2ML IJ SOLN
INTRAMUSCULAR | Status: DC | PRN
  Administered 2013-06-14: 4 mg via INTRAVENOUS

## 2013-06-14 MED ORDER — FENTANYL CITRATE 0.05 MG/ML IJ SOLN
INTRAMUSCULAR | Status: DC | PRN
  Administered 2013-06-14: 50 ug via INTRAVENOUS
  Administered 2013-06-14 (×2): 25 ug via INTRAVENOUS
  Administered 2013-06-14: 50 ug via INTRAVENOUS

## 2013-06-14 MED ORDER — EPINEPHRINE HCL 1 MG/ML IJ SOLN
INTRAMUSCULAR | Status: AC
  Filled 2013-06-14: qty 1

## 2013-06-14 MED ORDER — FENTANYL CITRATE 0.05 MG/ML IJ SOLN
INTRAMUSCULAR | Status: AC
  Filled 2013-06-14: qty 4

## 2013-06-14 MED ORDER — OXYCODONE HCL 5 MG PO TABS: 5.0000 mg | ORAL_TABLET | Freq: Once | ORAL | Status: DC | PRN

## 2013-06-14 MED ORDER — OXYMETAZOLINE HCL 0.05 % NA SOLN
NASAL | Status: DC | PRN
  Administered 2013-06-14: 1

## 2013-06-14 SURGICAL SUPPLY — 86 items
ADH SKN CLS APL DERMABOND .7 (GAUZE/BANDAGES/DRESSINGS)
BALL CTTN LRG ABS STRL LF (GAUZE/BANDAGES/DRESSINGS) ×1
BIT DRILL LEGEND 0.5MM 70MM (BIT) IMPLANT
BIT DRILL LEGEND 1.0MM 70MM (BIT) IMPLANT
BIT DRILL LEGEND 4.0MM 70MM (BIT) IMPLANT
BLADE EAR TYMPAN 2.5 60D BEAV (BLADE) IMPLANT
BLADE NDL 3 SS STRL (BLADE) IMPLANT
BLADE NEEDLE 3 SS STRL (BLADE) IMPLANT
BLADE SURG ROTATE 9660 (MISCELLANEOUS) IMPLANT
CANISTER SUCT 1200ML W/VALVE (MISCELLANEOUS) ×2 IMPLANT
CORDS BIPOLAR (ELECTRODE) IMPLANT
COTTONBALL LRG STERILE PKG (GAUZE/BANDAGES/DRESSINGS) ×2 IMPLANT
DECANTER SPIKE VIAL GLASS SM (MISCELLANEOUS) ×1 IMPLANT
DERMABOND ADVANCED (GAUZE/BANDAGES/DRESSINGS)
DERMABOND ADVANCED .7 DNX12 (GAUZE/BANDAGES/DRESSINGS) IMPLANT
DRAIN PENROSE 1/2X12 LTX STRL (WOUND CARE) IMPLANT
DRAIN PENROSE 1/4X12 LTX STRL (WOUND CARE) IMPLANT
DRAPE INCISE 23X17 IOBAN STRL (DRAPES)
DRAPE INCISE 23X17 STRL (DRAPES) IMPLANT
DRAPE INCISE IOBAN 23X17 STRL (DRAPES) IMPLANT
DRAPE MICROSCOPE WILD 40.5X102 (DRAPES) ×2 IMPLANT
DRILL BIT LEGEND (BIT) IMPLANT
DRILL BIT LEGEND 7BA20-MN (BIT) IMPLANT
DRILL BIT LEGEND 7BA25-MN (BIT) IMPLANT
DRILL BIT LEGEND 7BA30-MN (BIT) IMPLANT
DRILL BIT LEGEND 7BA30D-MN (BIT) IMPLANT
DRILL BIT LEGEND 7BA30DL-MN (BIT) ×1 IMPLANT
DRILL BIT LEGEND 7BA30L-MN (BIT) IMPLANT
DRILL BIT LEGEND 7BA40-MN (BIT) IMPLANT
DRILL BIT LEGEND 7BA40D-MN (BIT) IMPLANT
DRILL BIT LEGEND 7BA50-MN (BIT) IMPLANT
DRILL BIT LEGEND 7BA50D-MN (BIT) IMPLANT
DRILL BIT LEGEND 7BA60-MN (BIT) IMPLANT
DRILL BIT LEGEND 7BA70-MN (BIT) IMPLANT
DRSG GLASSCOCK MASTOID ADT (GAUZE/BANDAGES/DRESSINGS) IMPLANT
DRSG GLASSCOCK MASTOID PED (GAUZE/BANDAGES/DRESSINGS) IMPLANT
ELECT COATED BLADE 2.86 ST (ELECTRODE) ×1 IMPLANT
ELECT PAIRED SUBDERMAL (MISCELLANEOUS) ×2
ELECT REM PT RETURN 9FT ADLT (ELECTROSURGICAL) ×2
ELECTRODE PAIRED SUBDERMAL (MISCELLANEOUS) IMPLANT
ELECTRODE REM PT RTRN 9FT ADLT (ELECTROSURGICAL) ×1 IMPLANT
GAUZE SPONGE 4X4 12PLY STRL LF (GAUZE/BANDAGES/DRESSINGS) IMPLANT
GLOVE BIO SURGEON STRL SZ7.5 (GLOVE) ×3 IMPLANT
GLOVE BIOGEL PI IND STRL 7.0 (GLOVE) IMPLANT
GLOVE BIOGEL PI INDICATOR 7.0 (GLOVE) ×2
GLOVE ECLIPSE 6.5 STRL STRAW (GLOVE) ×1 IMPLANT
GLOVE EXAM NITRILE MD LF STRL (GLOVE) ×1 IMPLANT
GLOVE SURG SS PI 7.0 STRL IVOR (GLOVE) ×2 IMPLANT
GOWN PREVENTION PLUS XLARGE (GOWN DISPOSABLE) ×6 IMPLANT
HEMOSTAT SURGICEL .5X2 ABSORB (HEMOSTASIS) IMPLANT
HEMOSTAT SURGICEL 2X14 (HEMOSTASIS) IMPLANT
IV CATH AUTO 14GX1.75 SAFE ORG (IV SOLUTION) ×2 IMPLANT
IV NS 500ML (IV SOLUTION) ×2
IV NS 500ML BAXH (IV SOLUTION) ×1 IMPLANT
NDL HYPO 25X1 1.5 SAFETY (NEEDLE) ×1 IMPLANT
NDL SAFETY ECLIPSE 18X1.5 (NEEDLE) ×1 IMPLANT
NEEDLE HYPO 18GX1.5 SHARP (NEEDLE)
NEEDLE HYPO 25X1 1.5 SAFETY (NEEDLE) ×2 IMPLANT
NS IRRIG 1000ML POUR BTL (IV SOLUTION) ×2 IMPLANT
PACK AMBRUS EAR (MISCELLANEOUS) ×1 IMPLANT
PACK BASIN DAY SURGERY FS (CUSTOM PROCEDURE TRAY) ×2 IMPLANT
PACK ENT DAY SURGERY (CUSTOM PROCEDURE TRAY) ×2 IMPLANT
PENCIL BUTTON HOLSTER BLD 10FT (ELECTRODE) ×2 IMPLANT
PROBE NERVBE PRASS .33 (MISCELLANEOUS) IMPLANT
SET EXT MALE ROTATING LL 32IN (MISCELLANEOUS) ×2 IMPLANT
SET IV EXT TUBING FEMALE 31 (MISCELLANEOUS) ×1 IMPLANT
SLEEVE SCD COMPRESS KNEE MED (MISCELLANEOUS) ×1 IMPLANT
SPONGE GAUZE 4X4 12PLY (GAUZE/BANDAGES/DRESSINGS) IMPLANT
SPONGE SURGIFOAM ABS GEL 12-7 (HEMOSTASIS) IMPLANT
STAPLER VISISTAT 35W (STAPLE) IMPLANT
STRIP CLOSURE SKIN 1/2X4 (GAUZE/BANDAGES/DRESSINGS) IMPLANT
SUT BONE WAX W31G (SUTURE) IMPLANT
SUT CHROMIC 4 0 P 3 18 (SUTURE) ×1 IMPLANT
SUT ETHILON 5 0 PS 2 18 (SUTURE) IMPLANT
SUT NOVAFIL 5 0 BLK 18 IN P13 (SUTURE) IMPLANT
SUT PLAIN 5 0 P 3 18 (SUTURE) IMPLANT
SUT PROLENE 4 0 PS 2 18 (SUTURE) ×1 IMPLANT
SUT SILK 4 0 P 3 (SUTURE) IMPLANT
SUT VIC AB 3-0 SH 27 (SUTURE)
SUT VIC AB 3-0 SH 27X BRD (SUTURE) ×1 IMPLANT
SYR 3ML 18GX1 1/2 (SYRINGE) ×1 IMPLANT
SYR 5ML LL (SYRINGE) ×1 IMPLANT
SYR BULB 3OZ (MISCELLANEOUS) ×1 IMPLANT
TOWEL OR 17X24 6PK STRL BLUE (TOWEL DISPOSABLE) ×3 IMPLANT
TRAY DSU PREP LF (CUSTOM PROCEDURE TRAY) ×2 IMPLANT
TUBING IRRIGATION STER IRD100 (TUBING) ×1 IMPLANT

## 2013-06-14 NOTE — H&P (Signed)
  H&P Update  Pt's original H&P dated 05/19/13 reviewed and placed in chart (to be scanned).  I personally examined the patient today.  No change in health. Proceed with left ear meatoplasty and skin graft.

## 2013-06-14 NOTE — Anesthesia Postprocedure Evaluation (Signed)
  Anesthesia Post-op Note  Patient: Stephen Bates  Procedure(s) Performed: Procedure(s): LEFT EAR MEATOPLASTY WITH FULL THICKNESS SKIN GRAFT FROM ABDOMEN (Left)  Patient Location: PACU  Anesthesia Type:General  Level of Consciousness: awake  Airway and Oxygen Therapy: Patient Spontanous Breathing  Post-op Pain: mild  Post-op Assessment: Post-op Vital signs reviewed, Patient's Cardiovascular Status Stable, Respiratory Function Stable, Patent Airway, No signs of Nausea or vomiting and Pain level controlled  Post-op Vital Signs: Reviewed and stable  Complications: No apparent anesthesia complications

## 2013-06-14 NOTE — Anesthesia Preprocedure Evaluation (Signed)
Anesthesia Evaluation  Patient identified by MRN, date of birth, ID band Patient awake    Reviewed: Allergy & Precautions, H&P , NPO status , Patient's Chart, lab work & pertinent test results  Airway Mallampati: I TM Distance: >3 FB Neck ROM: Full    Dental   Pulmonary former smoker,  breath sounds clear to auscultation        Cardiovascular hypertension, Rhythm:Regular Rate:Normal     Neuro/Psych CVA    GI/Hepatic   Endo/Other    Renal/GU      Musculoskeletal   Abdominal   Peds  Hematology   Anesthesia Other Findings   Reproductive/Obstetrics                           Anesthesia Physical Anesthesia Plan  ASA: III  Anesthesia Plan: General   Post-op Pain Management:    Induction: Intravenous  Airway Management Planned: LMA  Additional Equipment:   Intra-op Plan:   Post-operative Plan: Extubation in OR  Informed Consent: I have reviewed the patients History and Physical, chart, labs and discussed the procedure including the risks, benefits and alternatives for the proposed anesthesia with the patient or authorized representative who has indicated his/her understanding and acceptance.     Plan Discussed with: CRNA and Surgeon  Anesthesia Plan Comments:         Anesthesia Quick Evaluation

## 2013-06-14 NOTE — Anesthesia Procedure Notes (Signed)
Procedure Name: LMA Insertion Date/Time: 06/14/2013 8:47 AM Performed by: Caren Macadam Pre-anesthesia Checklist: Patient identified, Emergency Drugs available, Suction available and Patient being monitored Patient Re-evaluated:Patient Re-evaluated prior to inductionOxygen Delivery Method: Circle System Utilized Preoxygenation: Pre-oxygenation with 100% oxygen Intubation Type: IV induction Ventilation: Mask ventilation without difficulty LMA: LMA inserted LMA Size: 4.0 Number of attempts: 1 Airway Equipment and Method: bite block Placement Confirmation: positive ETCO2 and breath sounds checked- equal and bilateral Tube secured with: Tape Dental Injury: Teeth and Oropharynx as per pre-operative assessment

## 2013-06-14 NOTE — Telephone Encounter (Signed)
Patient last TCS was 12/09 with polypectomy, he dose have fam hx colon ca -- when is patient due for repeat TCS -- please advise

## 2013-06-14 NOTE — Transfer of Care (Signed)
Immediate Anesthesia Transfer of Care Note  Patient: Stephen Bates  Procedure(s) Performed: Procedure(s): LEFT EAR MEATOPLASTY WITH FULL THICKNESS SKIN GRAFT FROM ABDOMEN (Left)  Patient Location: PACU  Anesthesia Type:General  Level of Consciousness: awake  Airway & Oxygen Therapy: Patient Spontanous Breathing and Patient connected to face mask oxygen  Post-op Assessment: Report given to PACU RN and Post -op Vital signs reviewed and stable  Post vital signs: Reviewed and stable  Complications: No apparent anesthesia complications

## 2013-06-14 NOTE — Brief Op Note (Signed)
06/14/2013  10:22 AM  PATIENT:  Stephen Bates  79 y.o. male  PRE-OPERATIVE DIAGNOSIS:  Left Ear Canal Cholesteatoma  POST-OPERATIVE DIAGNOSIS:  Left Ear Canal Cholesteatoma  PROCEDURE:  Procedure(s): LEFT EAR MEATOPLASTY WITH  FULL THICKNESS SKIN GRAFT FROM ABDOMEN (Left)  SURGEON:  Surgeon(s) and Role:    * Darletta Moll, MD - Primary  PHYSICIAN ASSISTANT:   ASSISTANTS: none   ANESTHESIA:   general  EBL:  Total I/O In: 1000 [I.V.:1000] Out: -   BLOOD ADMINISTERED:none  DRAINS: none   LOCAL MEDICATIONS USED:  LIDOCAINE   SPECIMEN:  Source of Specimen:  Ear canal cholesteatoma  DISPOSITION OF SPECIMEN:  PATHOLOGY  COUNTS:  YES  TOURNIQUET:  * No tourniquets in log *  DICTATION: .Other Dictation: Dictation Number [id number]  PLAN OF CARE: Discharge to home after PACU  PATIENT DISPOSITION:  PACU - hemodynamically stable.   Delay start of Pharmacological VTE agent (>24hrs) due to surgical blood loss or risk of bleeding: not applicable

## 2013-06-15 NOTE — Op Note (Signed)
Stephen Bates, Stephen Bates              ACCOUNT NO.:  [id number]  MEDICAL RECORD NO.:  [id number]  LOCATION:                                 FACILITY:  PHYSICIAN:  Newman Pies, MD            DATE OF BIRTH:  [birth year]  DATE OF PROCEDURE:  06/14/2013 DATE OF DISCHARGE:                              OPERATIVE REPORT   SURGEON:  Newman Pies, MD  PREOPERATIVE DIAGNOSIS:  Left ear canal cholesteatoma.  POSTOPERATIVE DIAGNOSIS:  Left ear canal cholesteatoma.  PROCEDURE PERFORMED: 1. Left ear meatoplasty. 2. Reconstruction with full-thickness skin graft from abdomen.  ANESTHESIA:  General laryngeal mask anesthesia.  COMPLICATIONS:  None.  ESTIMATED BLOOD LOSS:  Minimal.  INDICATION FOR PROCEDURE:  The patient is an 79 year old male who was recently noted to have left ear canal cholesteatoma.  The cholesteatoma was noted to have eroded ear canal bone.  On a CT scan, no significant middle ear or mastoid involvement was noted.  Based on the above findings, the decision was made for the patient to undergo excision of the left ear canal cholesteatoma with meatoplasty and reconstruction with full-thickness skin graft from his abdomen.  The risks, benefits, alternatives, and details of the procedure were discussed with the patient.  Questions were invited and answered.  Informed consent was obtained.  DESCRIPTION:  The patient was taken to the operating room and placed supine on the operating table.  General laryngeal mask anesthesia was induced by the anesthesiologist.  Preop IV antibiotics was given.  The patient was positioned and prepped and draped in a standard fashion for left ear surgery, and abdominal skin graft harvesting.  Lidocaine 1% with 1:100,000 epinephrine was injected into the ear canal, and left inferior abdominal skin.  Attention was first focused on the left ear.  Under the operating microscope, the left ear canal was examined.  A large amount of cholesteatoma and debris were  noted within the ear canal.  Prior to removal of the cholesteatoma, facial nerve NIMS electrodes were placed. The facial nerve monitoring system was functional throughout the case. Under the operating microscope, the left ear canal cholesteatoma was removed in a piecemeal fashion.  The inferior and anterior ear canal skin was completely replaced by the cholesteatoma.  A significant amount of erosion into the inferior and the posterior aspect of the ear canal were also noted.  Several mastoid air cells were exposed.  A cuff of normal ear canal skin was also excised together with the cholesteatoma. Under the operating microscope, the eroded bone was carefully smoothed with 3-0 diamond drill.  In order to avoid significant extension into the mastoid cavity, only limited drooling was performed on the posterior aspect.  Smooth contour of the ear canal was achieved.  The cholesteatoma specimen was sent to the Pathology Department for permanent histologic identification.  Attention was then focused on obtaining the full-thickness skin graft. A 3 x 2 cm full-thickness skin graft was obtained from the left inferior abdominal wall.  The harvesting site was then copiously irrigated. After the surrounding skin was undermined, the incision was closed primarily with interrupted 4-0 Prolene sutures.  The full-thickness skin graft was  then placed in the ear canal.  It was tacked down to the surrounding skin with 4-0 chromic sutures.  An Ambrose ear wick was placed.  Ciprodex ear drops were applied to the ear canal.  That concluded the procedure for the patient.  The care of the patient was turned over to the anesthesiologist.  The patient was awakened from anesthesia without difficulty.  He was extubated and transferred to the recovery room in good condition.  INTRAOPERATIVE FINDINGS:  A large amount of left ear canal cholesteatoma was noted.  The cholesteatoma was noted to have eroded the ear  canal bone inferiorly and posteriorly.  Several mastoid air cells were exposed.  The cholesteatoma was removed.  A full-thickness skin graft was used to cover the exposed bone.  SPECIMEN:  Left ear canal cholesteatoma.  FOLLOWUP CARE:  The patient will be discharged home once he is awake and alert.  He will be placed on Vicodin p.r.n. pain, and Ciprodex ear drops for 1 week.  He will follow up in my office in 1 week.     Newman Pies, MD     ST/MEDQ  D:  06/14/2013  T:  06/15/2013  Job:  [id number]

## 2013-06-16 NOTE — Telephone Encounter (Signed)
Sister had CRC. Plan schedule patient for colonoscopy if he wants to.

## 2013-06-20 ENCOUNTER — Encounter (INDEPENDENT_AMBULATORY_CARE_PROVIDER_SITE_OTHER): Payer: Self-pay | Admitting: *Deleted

## 2013-06-20 NOTE — Telephone Encounter (Signed)
Letter mailed to patient to give me a call to schedule repeat TCS

## 2013-06-23 ENCOUNTER — Ambulatory Visit (INDEPENDENT_AMBULATORY_CARE_PROVIDER_SITE_OTHER): Payer: Medicare Other | Admitting: Otolaryngology

## 2013-06-23 ENCOUNTER — Encounter (INDEPENDENT_AMBULATORY_CARE_PROVIDER_SITE_OTHER): Payer: Self-pay

## 2013-07-12 DIAGNOSIS — R5383 Other fatigue: Secondary | ICD-10-CM | POA: Diagnosis not present

## 2013-07-12 DIAGNOSIS — R5381 Other malaise: Secondary | ICD-10-CM | POA: Diagnosis not present

## 2013-08-04 ENCOUNTER — Ambulatory Visit (INDEPENDENT_AMBULATORY_CARE_PROVIDER_SITE_OTHER): Payer: Medicare Other | Admitting: Otolaryngology

## 2013-09-29 ENCOUNTER — Ambulatory Visit (INDEPENDENT_AMBULATORY_CARE_PROVIDER_SITE_OTHER): Payer: Medicare Other | Admitting: Otolaryngology

## 2013-09-29 DIAGNOSIS — H6040 Cholesteatoma of external ear, unspecified ear: Secondary | ICD-10-CM | POA: Diagnosis not present

## 2013-10-10 DIAGNOSIS — Z9181 History of falling: Secondary | ICD-10-CM | POA: Diagnosis not present

## 2013-10-18 ENCOUNTER — Encounter: Payer: Self-pay | Admitting: Diagnostic Neuroimaging

## 2013-10-18 ENCOUNTER — Encounter (INDEPENDENT_AMBULATORY_CARE_PROVIDER_SITE_OTHER): Payer: Self-pay

## 2013-10-18 ENCOUNTER — Ambulatory Visit (INDEPENDENT_AMBULATORY_CARE_PROVIDER_SITE_OTHER): Payer: Medicare Other | Admitting: Diagnostic Neuroimaging

## 2013-10-18 VITALS — BP 146/60 | HR 68 | Temp 98.2°F | Ht 64.0 in | Wt 139.5 lb

## 2013-10-18 DIAGNOSIS — R5381 Other malaise: Secondary | ICD-10-CM | POA: Diagnosis not present

## 2013-10-18 DIAGNOSIS — G20 Parkinson's disease: Secondary | ICD-10-CM

## 2013-10-18 DIAGNOSIS — R269 Unspecified abnormalities of gait and mobility: Secondary | ICD-10-CM | POA: Diagnosis not present

## 2013-10-18 DIAGNOSIS — G20C Parkinsonism, unspecified: Secondary | ICD-10-CM

## 2013-10-18 DIAGNOSIS — K117 Disturbances of salivary secretion: Secondary | ICD-10-CM

## 2013-10-18 DIAGNOSIS — G629 Polyneuropathy, unspecified: Secondary | ICD-10-CM

## 2013-10-18 DIAGNOSIS — G589 Mononeuropathy, unspecified: Secondary | ICD-10-CM | POA: Diagnosis not present

## 2013-10-18 MED ORDER — CARBIDOPA-LEVODOPA 25-100 MG PO TABS: 1.0000 | ORAL_TABLET | Freq: Three times a day (TID) | ORAL | Status: DC

## 2013-10-18 NOTE — Progress Notes (Signed)
[facility]  PATIENT: Stephen Bates DOB: 1927-04-24  REFERRING CLINICIAN: R Fagan HISTORY FROM: patient, wife, daughter REASON FOR VISIT: new consult   HISTORICAL  CHIEF COMPLAINT:  Chief Complaint  Patient presents with  . Fall  . Gait Problem    HISTORY OF PRESENT ILLNESS:   80 year old right-handed male with hypertension, diabetes, depression, anxiety, prostate cancer, here for evaluation of gait and balance difficulty.  2012 patient was able to walk on an outdoor trail 2 miles in 45 minutes. Since that, he gradually had gait and balance difficulty. He fell in 2012 later that year, onto his bottom with significant pain in his buttocks and legs. Patient gradually had some improvement in pain but his balance and walking never covered. Patient is also has intermittent tremor in his arms as well as intermittent freezing of gait. No memory problems according to patient her family. No significant swallowing problems. He has had increasing drooling and carries a napkin with him.   REVIEW OF SYSTEMS: Full 14 system review of systems performed and notable only for an weakness tremor depression anxiety decreased energy disinterest activities poor appetite constipation fatigue swelling in legs hearing loss easy bruising easy bleeding balance difficulty urination problems incontinence.  ALLERGIES: No Active Allergies  HOME MEDICATIONS: Outpatient Prescriptions Prior to Visit  Medication Sig Dispense Refill  . aspirin EC 81 MG tablet Take 81 mg by mouth daily.      . cholecalciferol (VITAMIN D) 1000 UNITS tablet Take 1,000 Units by mouth daily.      . hydrochlorothiazide (HYDRODIURIL) 25 MG tablet Take 25 mg by mouth daily.      . tamsulosin (FLOMAX) 0.4 MG CAPS Take 0.4 mg by mouth at bedtime.       Marland Kitchen HYDROcodone-acetaminophen (NORCO/VICODIN) 5-325 MG per tablet Take 1 tablet by mouth every 4 (four) hours as needed for moderate pain.  20 tablet  0  . olmesartan  (BENICAR) 40 MG tablet Take 40 mg by mouth daily.      Vladimir Faster Glycol-Propyl Glycol (LUBRICANT EYE DROPS) 0.4-0.3 % SOLN Apply 1 drop to eye daily as needed (for dry eye relief).       No facility-administered medications prior to visit.    PAST MEDICAL HISTORY: Past Medical History  Diagnosis Date  . Hypertension   . Prostate cancer   . Weakness of both legs   . Stroke 1982  . Wears glasses   . Arthritis   . Benign neoplasm of colon   . History of right bundle branch block   . Diabetes     PAST SURGICAL HISTORY: Past Surgical History  Procedure Laterality Date  . Eye surgery  2010    both cataracts  . Colonoscopy    . Insertion prostate radiation seed  2001  . Mohs surgery  12/12/2008    FAMILY HISTORY: Family History  Problem Relation Age of Onset  . Colon cancer Sister   . Bladder Cancer Father   . Bronchopulmonary dysplasia Mother     SOCIAL HISTORY:  History   Social History  . Marital Status: Married    Spouse Name: [name]    Number of Children: 2  . Years of Education: HS   Occupational History  . Retired    Social History Main Topics  . Smoking status: Former Smoker    Quit date: 06/08/1969  . Smokeless tobacco: Never Used  . Alcohol Use: Yes     Comment: daily x2 now  . Drug Use:  No  . Sexual Activity: Not on file   Other Topics Concern  . Not on file   Social History Narrative   Patient lives at home with his spouse.   Caffeine Use: very rarely     PHYSICAL EXAM  Filed Vitals:   10/18/13 0901  BP: 146/60  Pulse: 68  Temp: 98.2 F (36.8 C)  TempSrc: Oral  Height: 5\' 4"  (1.626 m)  Weight: 139 lb 8 oz (63.277 kg)    Not recorded    Body mass index is 23.93 kg/(m^2).  GENERAL EXAM: Patient is in no distress; well developed, nourished and groomed; neck is supple; MASKED FACIES. DROOLING. WIPES MOUTH FREQ WITH [place].   CARDIOVASCULAR: Regular rate and rhythm, no murmurs, no carotid bruits  NEUROLOGIC: MENTAL STATUS:  awake, alert, oriented to person, place and time, recent and remote memory intact, normal attention and concentration, language fluent, comprehension intact, naming intact, fund of knowledge appropriate; EXCEPT MMSE 18/30 (misses date, recall, world backwards, instruction, pentagons). GDS 8. AFT 4. Cannot draw clock. POSITIVE SNOUT, MYERSONS AND PALMOMENTAL REFLEXES. CRANIAL NERVE: no papilledema on fundoscopic exam, pupils equal and reactive to light, visual fields full to confrontation, extraocular muscles intact, no nystagmus, facial sensation and strength symmetric, hearing intact, palate elevates symmetrically, uvula midline, shoulder shrug symmetric, tongue midline. MOTOR: normal bulk; INCREASED TONE IN LUE AND BLE. BRADYKINESIA IN LEFT > RIGHT SIDE (UPPER AND LOWER EXT). ACTION>POSTURAL>REST TREMOR. Full strength in the BUE, BLE SENSORY: normal and symmetric to light touch, temperature; VIB < 5 SEC AT TOES.  COORDINATION: finger-nose-finger MILD INTENTION TREMOR; FFM SLOW. FOOT TAPPING SLOW.  REFLEXES: deep tendon reflexes TRACE and symmetric; ABSENT AT ANKLES GAIT/STATION: SIG DIFF STANDING UP, BUT ABLE TO STAND PUSHING UP ON WHEELCHAIR. TAKES SMALL, SHORT, SLIDING STEPS WITH WALKER IN FRONT. STOOPED POSTURE. VERY UNSTEADY.     DIAGNOSTIC DATA (LABS, IMAGING, TESTING) - I reviewed patient records, labs, notes, testing and imaging myself where available.  Lab Results  Component Value Date   WBC 8.4 08/22/2008   HGB 14.5 06/14/2013   HCT 42.2 08/22/2008   MCV 104.4* 08/22/2008   PLT 239 08/22/2008      Component Value Date/Time   NA 127* 06/09/2013 1400   K 3.8 06/09/2013 1400   CL 87* 06/09/2013 1400   CO2 29 06/09/2013 1400   GLUCOSE 219* 06/09/2013 1400   BUN 6 06/09/2013 1400   CREATININE 0.61 06/09/2013 1400   CALCIUM 10.2 06/09/2013 1400   GFRNONAA 89* 06/09/2013 1400   GFRAA >90 06/09/2013 1400   No results found for this basename: CHOL, HDL, LDLCALC, LDLDIRECT, TRIG, CHOLHDL   No  results found for this basename: HGBA1C   No results found for this basename: VITAMINB12   No results found for this basename: TSH    I reviewed images myself and agree with interpretation; in addition there is moderate ventriculomegaly on ex vacuo basis, with mod-severe temporal atrophy.Moderate-severe chronic small vessel ischemic disease.  -VRP  12/10/12 CT HEAD 1. No acute intracranial abnormality. Atrophy and chronic ischemic white matter disease. Right cerebellar lacunar infarcts are likely chronic.  2. Posterior scalp hematoma and closure without underlying skull fracture.    ASSESSMENT AND PLAN  80 y.o. year old male here with progressive gait difficulty, memory loss, drooling, balance problems and falling since 2012. Suspect an underlying neurodegenerative condition, superimposed on additional factors.  Dx: neurodegenerative dz (ddx: parkinson's disease, parkinson's plus syndrome, dementia with lewy bodies, vascular dementia) + diabetic neuropathy + lumbar  radiculopathy + deconditioning  PLAN: - trial of carb/levo - PT - rollator walker  Orders Placed This Encounter  Procedures  . DME Other see comment  . Ambulatory referral to Physical Therapy   Return in about 3 months (around 01/17/2014).    Penni Bombard, MD 6/97/9480, 16:55 AM Certified in Neurology, Neurophysiology and Neuroimaging  [facility] [address], [place], [place] [address] [phone]

## 2013-10-18 NOTE — Patient Instructions (Signed)
Start carbidopa/levodopa: half tab three times per day for 2 weeks (30 minutes before meals). Then increase to 1 full tab three times per day.

## 2013-10-21 ENCOUNTER — Ambulatory Visit: Payer: Medicare Other | Admitting: Physical Therapy

## 2013-10-26 DIAGNOSIS — D485 Neoplasm of uncertain behavior of skin: Secondary | ICD-10-CM | POA: Diagnosis not present

## 2013-10-26 DIAGNOSIS — L905 Scar conditions and fibrosis of skin: Secondary | ICD-10-CM | POA: Diagnosis not present

## 2013-10-26 DIAGNOSIS — L570 Actinic keratosis: Secondary | ICD-10-CM | POA: Diagnosis not present

## 2013-11-04 ENCOUNTER — Encounter: Payer: Self-pay | Admitting: Diagnostic Neuroimaging

## 2013-11-07 NOTE — Telephone Encounter (Signed)
Stephen Bates (dtr) calling back regarding email to Dr Leta Baptist.  One concern she has, if patient needs to be before 7/16 appointment due to Levodopa does seem to be working.  Patient has been on meds for the last three weeks and she doesn't see an improvement.  Please return call.  Thanks

## 2013-11-08 NOTE — Telephone Encounter (Signed)
I called and spoke to wife.  Appt made for 11-25-13 at 1030 to f/u on medications.   She verbalized understanding.

## 2013-11-25 ENCOUNTER — Ambulatory Visit (INDEPENDENT_AMBULATORY_CARE_PROVIDER_SITE_OTHER): Payer: Medicare Other | Admitting: Diagnostic Neuroimaging

## 2013-11-25 ENCOUNTER — Encounter: Payer: Self-pay | Admitting: Diagnostic Neuroimaging

## 2013-11-25 VITALS — BP 146/69 | HR 74

## 2013-11-25 DIAGNOSIS — F0390 Unspecified dementia without behavioral disturbance: Secondary | ICD-10-CM | POA: Diagnosis not present

## 2013-11-25 DIAGNOSIS — G629 Polyneuropathy, unspecified: Secondary | ICD-10-CM

## 2013-11-25 DIAGNOSIS — G20 Parkinson's disease: Secondary | ICD-10-CM

## 2013-11-25 DIAGNOSIS — R413 Other amnesia: Secondary | ICD-10-CM

## 2013-11-25 DIAGNOSIS — R269 Unspecified abnormalities of gait and mobility: Secondary | ICD-10-CM

## 2013-11-25 DIAGNOSIS — G589 Mononeuropathy, unspecified: Secondary | ICD-10-CM

## 2013-11-25 DIAGNOSIS — F03A0 Unspecified dementia, mild, without behavioral disturbance, psychotic disturbance, mood disturbance, and anxiety: Secondary | ICD-10-CM

## 2013-11-25 MED ORDER — CARBIDOPA-LEVODOPA 25-100 MG PO TABS: 2.0000 | ORAL_TABLET | Freq: Three times a day (TID) | ORAL | Status: DC

## 2013-11-25 NOTE — Progress Notes (Signed)
[facility]  PATIENT: Stephen Bates DOB: [birth year]  REFERRING CLINICIAN: R Fagan HISTORY FROM: patient, wife, daughter REASON FOR VISIT: follow up   HISTORICAL  CHIEF COMPLAINT:  Chief Complaint  Patient presents with  . Parkinson Disease    f/u, Rm 7    HISTORY OF PRESENT ILLNESS:   UPDATE 11/25/13: Since last visit, continue s to have ait and balance problems. Not much benefit in gait with carb/levo, although tremor seems to be better. Drooling is improved.  PRIOR HPI (10/18/13): 80 year old right-handed male with hypertension, diabetes, depression, anxiety, prostate cancer, here for evaluation of gait and balance difficulty.  2012 patient was able to walk on an outdoor trail 2 miles in 45 minutes. Since that, he gradually had gait and balance difficulty. He fell in 2012 later that year, onto his bottom with significant pain in his buttocks and legs. Patient gradually had some improvement in pain but his balance and walking never covered. Patient is also has intermittent tremor in his arms as well as intermittent freezing of gait. No memory problems according to patient her family. No significant swallowing problems. He has had increasing drooling and carries a napkin with him.   REVIEW OF SYSTEMS: Full 14 system review of systems performed and notable only for poor appetite hearing loss nausea frequent waking difficulty walking tremors incontinent bladder.   ALLERGIES: No Active Allergies  HOME MEDICATIONS: Outpatient Prescriptions Prior to Visit  Medication Sig Dispense Refill  . aspirin EC 81 MG tablet Take 81 mg by mouth daily.      . benazepril (LOTENSIN) 40 MG tablet Take 1 tablet by mouth daily.      . cholecalciferol (VITAMIN D) 1000 UNITS tablet Take 1,000 Units by mouth daily.      . clonazePAM (KLONOPIN) 0.5 MG tablet Take 0.5 tablets by mouth 2 (two) times daily as needed.       . hydrochlorothiazide (HYDRODIURIL) 25 MG tablet Take 25 mg  by mouth daily.      . metFORMIN (GLUCOPHAGE-XR) 500 MG 24 hr tablet Take 1 tablet by mouth daily.      . tamsulosin (FLOMAX) 0.4 MG CAPS Take 0.4 mg by mouth at bedtime.       . vitamin B-12 (CYANOCOBALAMIN) 1000 MCG tablet Take 1,000 mcg by mouth daily.      . carbidopa-levodopa (SINEMET IR) 25-100 MG per tablet Take 1 tablet by mouth 3 (three) times daily.  90 tablet  12   No facility-administered medications prior to visit.    PAST MEDICAL HISTORY: Past Medical History  Diagnosis Date  . Hypertension   . Prostate cancer   . Weakness of both legs   . Stroke 1982  . Wears glasses   . Arthritis   . Benign neoplasm of colon   . History of right bundle branch block   . Diabetes     PAST SURGICAL HISTORY: Past Surgical History  Procedure Laterality Date  . Eye surgery  2010    both cataracts  . Colonoscopy    . Insertion prostate radiation seed  2001  . Mohs surgery  12/12/2008    FAMILY HISTORY: Family History  Problem Relation Age of Onset  . Colon cancer Sister   . Bladder Cancer Father   . Bronchopulmonary dysplasia Mother     SOCIAL HISTORY:  History   Social History  . Marital Status: Married    Spouse Name: Stephen Bates    Number of Children: 2  . Years of  Education: HS   Occupational History  . Retired    Social History Main Topics  . Smoking status: Former Smoker    Quit date: 06/08/1969  . Smokeless tobacco: Never Used  . Alcohol Use: Yes     Comment: daily x2 now  . Drug Use: No  . Sexual Activity: Not on file   Other Topics Concern  . Not on file   Social History Narrative   Patient lives at home with his spouse.   Caffeine Use: very rarely     PHYSICAL EXAM  Filed Vitals:   11/25/13 1038  BP: 146/69  Pulse: 74    Not recorded    Cannot calculate BMI with a height equal to zero.  GENERAL EXAM: Patient is in no distress; well developed, nourished and groomed; neck is supple; MASKED FACIES.   CARDIOVASCULAR: Regular rate and  rhythm, no murmurs, no carotid bruits  NEUROLOGIC: MENTAL STATUS: awake, alert, oriented to person, place and time, recent and remote memory intact, normal attention and concentration, language fluent, comprehension intact, naming intact, fund of knowledge appropriate; POSITIVE SNOUT, MYERSONS AND PALMOMENTAL REFLEXES. CRANIAL NERVE: pupils equal and reactive to light, visual fields full to confrontation, extraocular muscles intact, no nystagmus, facial sensation and strength symmetric, hearing intact, palate elevates symmetrically, uvula midline, shoulder shrug symmetric, tongue midline. MOTOR: normal bulk; NORMAL TONE IN BUE; NO REST TREMOR. NO POSTURAL TREMOR. BRADYKINESIA IN LEFT > RIGHT SIDE (UPPER AND LOWER EXT). Full strength in the BUE; BLE 4/5. SENSORY: normal and symmetric to light touch, temperature; VIB < 5 SEC AT TOES.  COORDINATION: finger-nose-finger SLOW. FOOT TAPPING SLOW.  REFLEXES: deep tendon reflexes TRACE and symmetric; ABSENT AT ANKLES GAIT/STATION: SIG DIFF STANDING UP, BUT ABLE TO STAND PUSHING UP ON WHEELCHAIR. TAKES SMALL, SHORT, SLIDING STEPS. STOOPED POSTURE. VERY UNSTEADY.     DIAGNOSTIC DATA (LABS, IMAGING, TESTING) - I reviewed patient records, labs, notes, testing and imaging myself where available.  Lab Results  Component Value Date   WBC 8.4 08/22/2008   HGB 14.5 06/14/2013   HCT 42.2 08/22/2008   MCV 104.4* 08/22/2008   PLT 239 08/22/2008      Component Value Date/Time   NA 127* 06/09/2013 1400   K 3.8 06/09/2013 1400   CL 87* 06/09/2013 1400   CO2 29 06/09/2013 1400   GLUCOSE 219* 06/09/2013 1400   BUN 6 06/09/2013 1400   CREATININE 0.61 06/09/2013 1400   CALCIUM 10.2 06/09/2013 1400   GFRNONAA 89* 06/09/2013 1400   GFRAA >90 06/09/2013 1400   No results found for this basename: CHOL,  HDL,  LDLCALC,  LDLDIRECT,  TRIG,  CHOLHDL   No results found for this basename: HGBA1C   No results found for this basename: VITAMINB12   No results found for this  basename: TSH    I reviewed images myself and agree with interpretation; in addition there is moderate ventriculomegaly on ex vacuo basis, with mod-severe temporal atrophy. Moderate-severe chronic small vessel ischemic disease.  -VRP  12/10/12 CT HEAD 1. No acute intracranial abnormality. Atrophy and chronic ischemic white matter disease. Right cerebellar lacunar infarcts are likely chronic.  2. Posterior scalp hematoma and closure without underlying skull fracture.   ASSESSMENT AND PLAN  80 y.o. year old male here with progressive gait difficulty, memory loss, drooling, balance problems and falling since 2012. Suspect an underlying neurodegenerative condition, superimposed on additional factors. Unfortunately, he is in a difficult position and we most likely cannot reverse his functional decline. However, we  are going to try to maintain his abilities with medications and PT, with a goal to avoid a transition to bedbound status.  Dx: neurodegenerative dz (ddx: parkinson's disease, parkinson's plus syndrome, dementia with lewy bodies, vascular dementia) + diabetic neuropathy + lumbar radiculopathy + deconditioning  PLAN: - increase carb/levo up to 1.5 tabs TID, then 2tabs TID, over next 1-2 months - home health PT - rollator walker  Orders Placed This Encounter  Procedures  . Home Health  . Face-to-face encounter (required for Medicare/Medicaid patients)   Return in about 4 months (around 03/28/2014).    Penni Bombard, MD 10/20/6061, 01:60 PM Certified in Neurology, Neurophysiology and Neuroimaging  [facility] [address] [place], [place] [address] [phone]

## 2013-11-29 DIAGNOSIS — R413 Other amnesia: Secondary | ICD-10-CM | POA: Insufficient documentation

## 2013-11-29 DIAGNOSIS — R269 Unspecified abnormalities of gait and mobility: Secondary | ICD-10-CM | POA: Insufficient documentation

## 2013-11-29 DIAGNOSIS — G20 Parkinson's disease: Secondary | ICD-10-CM | POA: Insufficient documentation

## 2013-11-29 DIAGNOSIS — F03A0 Unspecified dementia, mild, without behavioral disturbance, psychotic disturbance, mood disturbance, and anxiety: Secondary | ICD-10-CM | POA: Insufficient documentation

## 2013-11-29 DIAGNOSIS — F0390 Unspecified dementia without behavioral disturbance: Secondary | ICD-10-CM | POA: Insufficient documentation

## 2013-11-29 DIAGNOSIS — G629 Polyneuropathy, unspecified: Secondary | ICD-10-CM | POA: Insufficient documentation

## 2013-12-01 ENCOUNTER — Ambulatory Visit (INDEPENDENT_AMBULATORY_CARE_PROVIDER_SITE_OTHER): Payer: Medicare Other | Admitting: Otolaryngology

## 2013-12-01 DIAGNOSIS — H6040 Cholesteatoma of external ear, unspecified ear: Secondary | ICD-10-CM

## 2013-12-02 ENCOUNTER — Telehealth: Payer: Self-pay | Admitting: Diagnostic Neuroimaging

## 2013-12-02 NOTE — Telephone Encounter (Signed)
Returned daughter's call. No answer. Fwd home health referral to [facility].

## 2013-12-02 NOTE — Telephone Encounter (Signed)
Daughter calling questioning [facility] referral.  They hadn't heard from anymore.  Please return call. [phone] and [phone]. Thanks

## 2013-12-05 DIAGNOSIS — E1149 Type 2 diabetes mellitus with other diabetic neurological complication: Secondary | ICD-10-CM | POA: Diagnosis not present

## 2013-12-05 DIAGNOSIS — G3183 Dementia with Lewy bodies: Secondary | ICD-10-CM | POA: Diagnosis not present

## 2013-12-05 DIAGNOSIS — G20 Parkinson's disease: Secondary | ICD-10-CM | POA: Diagnosis not present

## 2013-12-05 DIAGNOSIS — F0280 Dementia in other diseases classified elsewhere without behavioral disturbance: Secondary | ICD-10-CM | POA: Diagnosis not present

## 2013-12-05 DIAGNOSIS — IMO0001 Reserved for inherently not codable concepts without codable children: Secondary | ICD-10-CM | POA: Diagnosis not present

## 2013-12-05 DIAGNOSIS — F0150 Vascular dementia without behavioral disturbance: Secondary | ICD-10-CM | POA: Diagnosis not present

## 2013-12-05 DIAGNOSIS — IMO0002 Reserved for concepts with insufficient information to code with codable children: Secondary | ICD-10-CM | POA: Diagnosis not present

## 2013-12-05 DIAGNOSIS — E1142 Type 2 diabetes mellitus with diabetic polyneuropathy: Secondary | ICD-10-CM | POA: Diagnosis not present

## 2013-12-07 DIAGNOSIS — G20 Parkinson's disease: Secondary | ICD-10-CM | POA: Diagnosis not present

## 2013-12-07 DIAGNOSIS — E1149 Type 2 diabetes mellitus with other diabetic neurological complication: Secondary | ICD-10-CM | POA: Diagnosis not present

## 2013-12-07 DIAGNOSIS — IMO0001 Reserved for inherently not codable concepts without codable children: Secondary | ICD-10-CM | POA: Diagnosis not present

## 2013-12-07 DIAGNOSIS — E1142 Type 2 diabetes mellitus with diabetic polyneuropathy: Secondary | ICD-10-CM | POA: Diagnosis not present

## 2013-12-07 DIAGNOSIS — F0280 Dementia in other diseases classified elsewhere without behavioral disturbance: Secondary | ICD-10-CM | POA: Diagnosis not present

## 2013-12-07 DIAGNOSIS — F0150 Vascular dementia without behavioral disturbance: Secondary | ICD-10-CM | POA: Diagnosis not present

## 2013-12-08 ENCOUNTER — Other Ambulatory Visit (HOSPITAL_COMMUNITY): Payer: Self-pay | Admitting: Neurology

## 2013-12-08 DIAGNOSIS — R269 Unspecified abnormalities of gait and mobility: Secondary | ICD-10-CM

## 2013-12-09 DIAGNOSIS — E1149 Type 2 diabetes mellitus with other diabetic neurological complication: Secondary | ICD-10-CM | POA: Diagnosis not present

## 2013-12-09 DIAGNOSIS — G3183 Dementia with Lewy bodies: Secondary | ICD-10-CM | POA: Diagnosis not present

## 2013-12-09 DIAGNOSIS — F0280 Dementia in other diseases classified elsewhere without behavioral disturbance: Secondary | ICD-10-CM | POA: Diagnosis not present

## 2013-12-09 DIAGNOSIS — F0150 Vascular dementia without behavioral disturbance: Secondary | ICD-10-CM | POA: Diagnosis not present

## 2013-12-09 DIAGNOSIS — IMO0001 Reserved for inherently not codable concepts without codable children: Secondary | ICD-10-CM | POA: Diagnosis not present

## 2013-12-09 DIAGNOSIS — E1142 Type 2 diabetes mellitus with diabetic polyneuropathy: Secondary | ICD-10-CM | POA: Diagnosis not present

## 2013-12-09 DIAGNOSIS — G20 Parkinson's disease: Secondary | ICD-10-CM | POA: Diagnosis not present

## 2013-12-13 ENCOUNTER — Other Ambulatory Visit (HOSPITAL_COMMUNITY): Payer: Medicare Other

## 2013-12-13 DIAGNOSIS — IMO0001 Reserved for inherently not codable concepts without codable children: Secondary | ICD-10-CM | POA: Diagnosis not present

## 2013-12-13 DIAGNOSIS — F0150 Vascular dementia without behavioral disturbance: Secondary | ICD-10-CM | POA: Diagnosis not present

## 2013-12-13 DIAGNOSIS — G20 Parkinson's disease: Secondary | ICD-10-CM | POA: Diagnosis not present

## 2013-12-13 DIAGNOSIS — F0280 Dementia in other diseases classified elsewhere without behavioral disturbance: Secondary | ICD-10-CM | POA: Diagnosis not present

## 2013-12-13 DIAGNOSIS — E1142 Type 2 diabetes mellitus with diabetic polyneuropathy: Secondary | ICD-10-CM | POA: Diagnosis not present

## 2013-12-13 DIAGNOSIS — E1149 Type 2 diabetes mellitus with other diabetic neurological complication: Secondary | ICD-10-CM | POA: Diagnosis not present

## 2013-12-14 ENCOUNTER — Ambulatory Visit (HOSPITAL_COMMUNITY)
Admission: RE | Admit: 2013-12-14 | Discharge: 2013-12-14 | Disposition: A | Discharge status: Home / Self Care | Payer: Medicare Other | Source: Ambulatory Visit | Attending: Neurology | Admitting: Neurology

## 2013-12-14 DIAGNOSIS — R269 Unspecified abnormalities of gait and mobility: Secondary | ICD-10-CM

## 2013-12-14 DIAGNOSIS — I6789 Other cerebrovascular disease: Secondary | ICD-10-CM | POA: Insufficient documentation

## 2013-12-14 DIAGNOSIS — R5381 Other malaise: Secondary | ICD-10-CM | POA: Insufficient documentation

## 2013-12-14 DIAGNOSIS — F0280 Dementia in other diseases classified elsewhere without behavioral disturbance: Secondary | ICD-10-CM | POA: Diagnosis not present

## 2013-12-14 DIAGNOSIS — G20 Parkinson's disease: Secondary | ICD-10-CM | POA: Diagnosis not present

## 2013-12-14 DIAGNOSIS — R42 Dizziness and giddiness: Secondary | ICD-10-CM | POA: Diagnosis not present

## 2013-12-14 DIAGNOSIS — E1149 Type 2 diabetes mellitus with other diabetic neurological complication: Secondary | ICD-10-CM | POA: Diagnosis not present

## 2013-12-14 DIAGNOSIS — F0150 Vascular dementia without behavioral disturbance: Secondary | ICD-10-CM | POA: Diagnosis not present

## 2013-12-14 DIAGNOSIS — R5383 Other fatigue: Secondary | ICD-10-CM

## 2013-12-14 DIAGNOSIS — IMO0001 Reserved for inherently not codable concepts without codable children: Secondary | ICD-10-CM | POA: Diagnosis not present

## 2013-12-14 DIAGNOSIS — E1142 Type 2 diabetes mellitus with diabetic polyneuropathy: Secondary | ICD-10-CM | POA: Diagnosis not present

## 2013-12-14 LAB — POCT I-STAT CREATININE: CREATININE: 0.7 mg/dL (ref 0.50–1.35)

## 2013-12-14 MED ORDER — GADOBENATE DIMEGLUMINE 529 MG/ML IV SOLN
12.0000 mL | Freq: Once | INTRAVENOUS | Status: AC | PRN
  Administered 2013-12-14: 12 mL via INTRAVENOUS

## 2013-12-16 DIAGNOSIS — G20 Parkinson's disease: Secondary | ICD-10-CM | POA: Diagnosis not present

## 2013-12-16 DIAGNOSIS — IMO0001 Reserved for inherently not codable concepts without codable children: Secondary | ICD-10-CM | POA: Diagnosis not present

## 2013-12-16 DIAGNOSIS — F0150 Vascular dementia without behavioral disturbance: Secondary | ICD-10-CM | POA: Diagnosis not present

## 2013-12-16 DIAGNOSIS — E1142 Type 2 diabetes mellitus with diabetic polyneuropathy: Secondary | ICD-10-CM | POA: Diagnosis not present

## 2013-12-16 DIAGNOSIS — E1149 Type 2 diabetes mellitus with other diabetic neurological complication: Secondary | ICD-10-CM | POA: Diagnosis not present

## 2013-12-16 DIAGNOSIS — F0280 Dementia in other diseases classified elsewhere without behavioral disturbance: Secondary | ICD-10-CM | POA: Diagnosis not present

## 2013-12-20 DIAGNOSIS — IMO0001 Reserved for inherently not codable concepts without codable children: Secondary | ICD-10-CM | POA: Diagnosis not present

## 2013-12-20 DIAGNOSIS — E1149 Type 2 diabetes mellitus with other diabetic neurological complication: Secondary | ICD-10-CM | POA: Diagnosis not present

## 2013-12-20 DIAGNOSIS — E1142 Type 2 diabetes mellitus with diabetic polyneuropathy: Secondary | ICD-10-CM | POA: Diagnosis not present

## 2013-12-20 DIAGNOSIS — F0280 Dementia in other diseases classified elsewhere without behavioral disturbance: Secondary | ICD-10-CM | POA: Diagnosis not present

## 2013-12-20 DIAGNOSIS — F0150 Vascular dementia without behavioral disturbance: Secondary | ICD-10-CM | POA: Diagnosis not present

## 2013-12-20 DIAGNOSIS — G20 Parkinson's disease: Secondary | ICD-10-CM | POA: Diagnosis not present

## 2013-12-22 DIAGNOSIS — G20 Parkinson's disease: Secondary | ICD-10-CM | POA: Diagnosis not present

## 2013-12-22 DIAGNOSIS — F0150 Vascular dementia without behavioral disturbance: Secondary | ICD-10-CM | POA: Diagnosis not present

## 2013-12-22 DIAGNOSIS — F0280 Dementia in other diseases classified elsewhere without behavioral disturbance: Secondary | ICD-10-CM | POA: Diagnosis not present

## 2013-12-22 DIAGNOSIS — E1142 Type 2 diabetes mellitus with diabetic polyneuropathy: Secondary | ICD-10-CM | POA: Diagnosis not present

## 2013-12-22 DIAGNOSIS — E1149 Type 2 diabetes mellitus with other diabetic neurological complication: Secondary | ICD-10-CM | POA: Diagnosis not present

## 2013-12-22 DIAGNOSIS — IMO0001 Reserved for inherently not codable concepts without codable children: Secondary | ICD-10-CM | POA: Diagnosis not present

## 2013-12-22 DIAGNOSIS — G3183 Dementia with Lewy bodies: Secondary | ICD-10-CM | POA: Diagnosis not present

## 2013-12-28 DIAGNOSIS — E119 Type 2 diabetes mellitus without complications: Secondary | ICD-10-CM | POA: Diagnosis not present

## 2013-12-28 DIAGNOSIS — I10 Essential (primary) hypertension: Secondary | ICD-10-CM | POA: Diagnosis not present

## 2013-12-28 DIAGNOSIS — Z79899 Other long term (current) drug therapy: Secondary | ICD-10-CM | POA: Diagnosis not present

## 2013-12-28 DIAGNOSIS — Z125 Encounter for screening for malignant neoplasm of prostate: Secondary | ICD-10-CM | POA: Diagnosis not present

## 2013-12-29 ENCOUNTER — Emergency Department (HOSPITAL_COMMUNITY)
Admission: EM | Admit: 2013-12-29 | Discharge: 2013-12-30 | Disposition: A | Discharge status: Home / Self Care | Payer: Medicare Other | Attending: Emergency Medicine | Admitting: Emergency Medicine

## 2013-12-29 ENCOUNTER — Emergency Department (HOSPITAL_COMMUNITY): Payer: Medicare Other

## 2013-12-29 ENCOUNTER — Encounter (HOSPITAL_COMMUNITY): Payer: Self-pay | Admitting: Emergency Medicine

## 2013-12-29 DIAGNOSIS — M25529 Pain in unspecified elbow: Secondary | ICD-10-CM | POA: Diagnosis not present

## 2013-12-29 DIAGNOSIS — S0100XA Unspecified open wound of scalp, initial encounter: Secondary | ICD-10-CM | POA: Diagnosis not present

## 2013-12-29 DIAGNOSIS — Z8546 Personal history of malignant neoplasm of prostate: Secondary | ICD-10-CM | POA: Diagnosis not present

## 2013-12-29 DIAGNOSIS — M129 Arthropathy, unspecified: Secondary | ICD-10-CM | POA: Diagnosis not present

## 2013-12-29 DIAGNOSIS — S0990XA Unspecified injury of head, initial encounter: Secondary | ICD-10-CM

## 2013-12-29 DIAGNOSIS — S0993XA Unspecified injury of face, initial encounter: Secondary | ICD-10-CM | POA: Insufficient documentation

## 2013-12-29 DIAGNOSIS — S0003XA Contusion of scalp, initial encounter: Secondary | ICD-10-CM | POA: Insufficient documentation

## 2013-12-29 DIAGNOSIS — S52123A Displaced fracture of head of unspecified radius, initial encounter for closed fracture: Secondary | ICD-10-CM | POA: Diagnosis not present

## 2013-12-29 DIAGNOSIS — S50312A Abrasion of left elbow, initial encounter: Secondary | ICD-10-CM

## 2013-12-29 DIAGNOSIS — E119 Type 2 diabetes mellitus without complications: Secondary | ICD-10-CM | POA: Diagnosis not present

## 2013-12-29 DIAGNOSIS — Z7982 Long term (current) use of aspirin: Secondary | ICD-10-CM | POA: Diagnosis not present

## 2013-12-29 DIAGNOSIS — Z87891 Personal history of nicotine dependence: Secondary | ICD-10-CM | POA: Insufficient documentation

## 2013-12-29 DIAGNOSIS — Z79899 Other long term (current) drug therapy: Secondary | ICD-10-CM | POA: Diagnosis not present

## 2013-12-29 DIAGNOSIS — Z8673 Personal history of transient ischemic attack (TIA), and cerebral infarction without residual deficits: Secondary | ICD-10-CM | POA: Insufficient documentation

## 2013-12-29 DIAGNOSIS — S52122A Displaced fracture of head of left radius, initial encounter for closed fracture: Secondary | ICD-10-CM

## 2013-12-29 DIAGNOSIS — S0001XA Abrasion of scalp, initial encounter: Secondary | ICD-10-CM

## 2013-12-29 DIAGNOSIS — Z8719 Personal history of other diseases of the digestive system: Secondary | ICD-10-CM | POA: Diagnosis not present

## 2013-12-29 DIAGNOSIS — M25559 Pain in unspecified hip: Secondary | ICD-10-CM | POA: Diagnosis not present

## 2013-12-29 DIAGNOSIS — I10 Essential (primary) hypertension: Secondary | ICD-10-CM | POA: Diagnosis not present

## 2013-12-29 DIAGNOSIS — S6990XA Unspecified injury of unspecified wrist, hand and finger(s), initial encounter: Secondary | ICD-10-CM | POA: Diagnosis not present

## 2013-12-29 DIAGNOSIS — S0083XA Contusion of other part of head, initial encounter: Secondary | ICD-10-CM | POA: Diagnosis not present

## 2013-12-29 DIAGNOSIS — G20 Parkinson's disease: Secondary | ICD-10-CM | POA: Diagnosis not present

## 2013-12-29 DIAGNOSIS — S99919A Unspecified injury of unspecified ankle, initial encounter: Secondary | ICD-10-CM

## 2013-12-29 DIAGNOSIS — S59909A Unspecified injury of unspecified elbow, initial encounter: Secondary | ICD-10-CM | POA: Diagnosis not present

## 2013-12-29 DIAGNOSIS — S51009A Unspecified open wound of unspecified elbow, initial encounter: Secondary | ICD-10-CM | POA: Insufficient documentation

## 2013-12-29 DIAGNOSIS — S51011A Laceration without foreign body of right elbow, initial encounter: Secondary | ICD-10-CM

## 2013-12-29 DIAGNOSIS — F03A0 Unspecified dementia, mild, without behavioral disturbance, psychotic disturbance, mood disturbance, and anxiety: Secondary | ICD-10-CM

## 2013-12-29 DIAGNOSIS — IMO0002 Reserved for concepts with insufficient information to code with codable children: Secondary | ICD-10-CM | POA: Diagnosis not present

## 2013-12-29 DIAGNOSIS — S8990XA Unspecified injury of unspecified lower leg, initial encounter: Secondary | ICD-10-CM | POA: Insufficient documentation

## 2013-12-29 DIAGNOSIS — S1093XA Contusion of unspecified part of neck, initial encounter: Secondary | ICD-10-CM | POA: Diagnosis not present

## 2013-12-29 DIAGNOSIS — S199XXA Unspecified injury of neck, initial encounter: Secondary | ICD-10-CM

## 2013-12-29 DIAGNOSIS — Y92009 Unspecified place in unspecified non-institutional (private) residence as the place of occurrence of the external cause: Secondary | ICD-10-CM | POA: Insufficient documentation

## 2013-12-29 DIAGNOSIS — S0101XA Laceration without foreign body of scalp, initial encounter: Secondary | ICD-10-CM

## 2013-12-29 DIAGNOSIS — R296 Repeated falls: Secondary | ICD-10-CM | POA: Insufficient documentation

## 2013-12-29 DIAGNOSIS — G20A1 Parkinson's disease without dyskinesia, without mention of fluctuations: Secondary | ICD-10-CM | POA: Insufficient documentation

## 2013-12-29 DIAGNOSIS — Y9389 Activity, other specified: Secondary | ICD-10-CM | POA: Insufficient documentation

## 2013-12-29 DIAGNOSIS — S41009A Unspecified open wound of unspecified shoulder, initial encounter: Secondary | ICD-10-CM | POA: Diagnosis not present

## 2013-12-29 DIAGNOSIS — F0390 Unspecified dementia without behavioral disturbance: Secondary | ICD-10-CM | POA: Diagnosis not present

## 2013-12-29 DIAGNOSIS — M549 Dorsalgia, unspecified: Secondary | ICD-10-CM | POA: Diagnosis not present

## 2013-12-29 DIAGNOSIS — S99929A Unspecified injury of unspecified foot, initial encounter: Secondary | ICD-10-CM

## 2013-12-29 LAB — CBG MONITORING, ED: GLUCOSE-CAPILLARY: 172 mg/dL — AB (ref 70–99)

## 2013-12-29 NOTE — ED Notes (Signed)
Dr. Zackowski at bedside  

## 2013-12-29 NOTE — Discharge Instructions (Signed)
Keep all the current dressings in place for 2 days. Can followup either at the emergency department or at Dr. Romona Curls stop this for the wound care. The scalp abrasions hematoma and partial lacerations or diarrhea acquired extensive wound care and secondary healing. Keep the dressings in place as stated for the next 2 days. For the elbow impaction fracture of the left arm followup with Dr. Aline Brochure. Keep the splint on and dry use a sling. Return for any newer worse symptoms.

## 2013-12-29 NOTE — ED Provider Notes (Signed)
CSN: [id number]     Arrival date & time 12/29/13  1656 History   First MD Initiated Contact with Patient 12/29/13 1801    This chart was scribed for Fredia Sorrow, MD by Anastasia Pall, ED Scribe. This patient was seen in room APA19/APA19 and the patient's care was started at 6:26 PM. Chief Complaint  Patient presents with  . Fall   Patient is a 80 y.o. male presenting with fall. The history is provided by the patient. No language interpreter was used.  Fall This is a new problem. The current episode started 1 to 2 hours ago. Pertinent negatives include no chest pain, no abdominal pain, no headaches and no shortness of breath. Associated symptoms comments: + for lower back pain, posterior neck pain, and abrasions.. Exacerbated by: movement.   HPI Comments: Stephen Bates is a 80 y.o. male BIB EMS who presents to the Emergency Department complaining of a fall at home onset 4:30PM. Patient complains of pain in the lower back and back of his neck. Pt remembers the fall, and believes that he got dizzy prior to falling. The wife reports that she returned home and that he was on the floor of the sun room. She states that patient was awake, but did not attempt to get up. He has a history of stroke several years ago, and lost a bit of balance on one side post-stroke. Patient also reports that he has had an MRI within the last two weeks. Patient denies headache, pain in hips, and LOC. Pt and wife believe that tetanus is UTD. Pt takes aspirin regularly and denies taking any major blood thinners.   PCP Asencion Noble, MD  Past Medical History  Diagnosis Date  . Hypertension   . Prostate cancer   . Weakness of both legs   . Stroke 1982  . Wears glasses   . Arthritis   . Benign neoplasm of colon   . History of right bundle branch block   . Diabetes    Past Surgical History  Procedure Laterality Date  . Eye surgery  2010    both cataracts  . Colonoscopy    . Insertion prostate radiation seed  2001   . Mohs surgery  12/12/2008   Family History  Problem Relation Age of Onset  . Colon cancer Sister   . Bladder Cancer Father   . Bronchopulmonary dysplasia Mother    History  Substance Use Topics  . Smoking status: Former Smoker    Quit date: 06/08/1969  . Smokeless tobacco: Never Used  . Alcohol Use: Yes     Comment: daily x2 now    Review of Systems  Constitutional: Negative for fever and chills.  HENT: Positive for sneezing.   Eyes: Negative.   Respiratory: Positive for cough. Negative for shortness of breath.   Cardiovascular: Positive for leg swelling. Negative for chest pain.       Edema in left foot  Gastrointestinal: Negative for nausea, vomiting, abdominal pain and diarrhea.  Genitourinary: Negative for dysuria.  Musculoskeletal: Positive for back pain and neck pain.       Pain in back of neck  Skin: Positive for rash.       Rash present on bottom  Neurological: Positive for dizziness. Negative for headaches.  Hematological: Bruises/bleeds easily.  Psychiatric/Behavioral: Negative for confusion.    Allergies  Review of patient's allergies indicates no known allergies.  Home Medications  Medications - No data to display Prior to Admission medications   Medication  Sig Start Date End Date Taking? Authorizing Angelita Harnack  aspirin EC 81 MG tablet Take 81 mg by mouth daily.   Yes Historical Tanija Germani, MD  benazepril (LOTENSIN) 40 MG tablet Take 1 tablet by mouth daily. 10/04/13  Yes Historical Jazae Gandolfi, MD  carbidopa-levodopa (SINEMET IR) 25-100 MG per tablet Take 2 tablets by mouth 3 (three) times daily. 11/25/13  Yes Penni Bombard, MD  hydrochlorothiazide (HYDRODIURIL) 25 MG tablet Take 25 mg by mouth daily.   Yes Historical Carrieann Spielberg, MD  metFORMIN (GLUCOPHAGE-XR) 500 MG 24 hr tablet Take 500 mg by mouth daily.  09/29/13  Yes Historical Anjolaoluwa Siguenza, MD  tamsulosin (FLOMAX) 0.4 MG CAPS Take 0.4 mg by mouth at bedtime.    Yes Historical Kade Rickels, MD  vitamin B-12  (CYANOCOBALAMIN) 1000 MCG tablet Take 1,000 mcg by mouth daily.   Yes Historical Jerrico Covello, MD   BP 167/79  Pulse 113  Temp(Src) 98.2 F (36.8 C) (Oral)  Resp 20  Wt 139 lb (63.05 kg)  SpO2 97% Physical Exam  Nursing note and vitals reviewed. Constitutional: He is oriented to person, place, and time. He appears well-developed and well-nourished. No distress.  HENT:  Head: Normocephalic.  Mouth/Throat: Oropharynx is clear and moist.  Scalp with topical abrasion superficial laceration. The abrasion measures about 3 x 4 cm. Superficial saturation measures about 1 cm. Box foot area with a large hematoma measuring about 4 x 4 cm rounded nature. Nothing really to suture. Clot underneath the skin. This is the need to heal by secondary intention.  Eyes: Conjunctivae and EOM are normal. Pupils are equal, round, and reactive to light.  Neck: Neck supple. No tracheal deviation present.  Cardiovascular: Normal rate, regular rhythm and normal heart sounds.   No murmur heard. Cap Refill in both toes 1 second Radial pulse on left and right wrists 2+  Pulmonary/Chest: Effort normal and breath sounds normal. No respiratory distress.  Abdominal: Soft. Bowel sounds are normal. There is no tenderness.  Musculoskeletal: Normal range of motion. He exhibits edema.  Swelling in left foot.  Neurological: He is alert and oriented to person, place, and time. No cranial nerve deficit. He exhibits normal muscle tone. Coordination normal.  Skin: Skin is warm and dry. There is erythema.  2 cm abrasion to left elbow with hematoma 1.5 laceration at point of right elbow, no hematoma, and does not seem to involve deep structures  Psychiatric: He has a normal mood and affect. His behavior is normal.    ED Course  Procedures (including critical care time)  DIAGNOSTIC STUDIES: Oxygen Saturation is 97% on RA, normal by my interpretation.    COORDINATION OF CARE: 6:37 PM-Discussed treatment plan which includes CAT  scan of head and neck, along with a x-ray of the elbows. Plan discussed with pt at bedside and pt agreed to plan.   Medications - No data to display  Results for orders placed during the hospital encounter of 12/29/13  CBG MONITORING, ED      Result Value Ref Range   Glucose-Capillary 172 (*) 70 - 99 mg/dL   Dg Lumbar Spine Complete  12/29/2013   CLINICAL DATA:  Fall.  Back pain.  EXAM: LUMBAR SPINE - COMPLETE 4+ VIEW  COMPARISON:  06/16/2012  FINDINGS: Prominent stool throughout the colon favors constipation. Aortoiliac atherosclerotic vascular disease. Bilateral facet arthropathy in the lower lumbar spine. 8 mm of degenerative anterolisthesis at L4-5, similar to the prior exam. Bridging spurring anteriorly in the upper lumbar spine. No fracture or acute bony  findings. No definite pars defects at L4.  IMPRESSION: 1. Lower lumbar spondylosis, with grade 1 anterolisthesis at L4-5 similar to prior. 2.  Aortoiliac atherosclerotic vascular disease. 3.  Prominent stool throughout the colon favors constipation.   Electronically Signed   By: Sherryl Barters M.D.   On: 12/29/2013 20:56   Dg Elbow Complete Left  12/29/2013   CLINICAL DATA:  Fall.  Left elbow pain.  EXAM: LEFT ELBOW - COMPLETE 3+ VIEW  COMPARISON:  Report from 03/30/2003  FINDINGS: Acute fracture of the lateral third of the radial head with a 1.9 mm step-off at the fracture site. Anterior fat pad is obscured, favoring elbow joint effusion. I do not observe an additional fracture. There is mild spurring of the coronoid process.  IMPRESSION: 1. Mildly impacted fracture of the lateral third of the radial head. Suspected elbow joint effusion.   Electronically Signed   By: Sherryl Barters M.D.   On: 12/29/2013 20:53   Dg Elbow Complete Right  12/29/2013   CLINICAL DATA:  Fall. Right elbow pain. Contralateral elbow fracture.  EXAM: RIGHT ELBOW - COMPLETE 3+ VIEW  COMPARISON:  None.  FINDINGS: Mild spurring of the coronoid process. Mild spurring at  the common flexor tendon attachment site along the medial epicondyle.  No elbow joint effusion. Supinator fat pad appears normal. No fracture seen.  IMPRESSION: 1. No acute bony findings are identified.  No elbow joint effusion.   Electronically Signed   By: Sherryl Barters M.D.   On: 12/29/2013 20:54   Dg Hip Bilateral W/pelvis  12/29/2013   CLINICAL DATA:  Fall.  Bilateral hip pain.  EXAM: BILATERAL HIP WITH PELVIS - 4+ VIEW  COMPARISON:  Multiple exams, including 09/08/2011  FINDINGS: Prostate seed implants. Moderate degenerative arthropathy of both hips. No fracture or acute findings.  IMPRESSION: 1. Degenerative arthropathy of both hips, without fracture or acute findings. 2. Prostate seed implants noted.   Electronically Signed   By: Sherryl Barters M.D.   On: 12/29/2013 20:51   Ct Head Wo Contrast  12/29/2013   CLINICAL DATA:  History of trauma from a fall with injury to the head.  EXAM: CT HEAD WITHOUT CONTRAST  CT CERVICAL SPINE WITHOUT CONTRAST  TECHNIQUE: Multidetector CT imaging of the head and cervical spine was performed following the standard protocol without intravenous contrast. Multiplanar CT image reconstructions of the cervical spine were also generated.  COMPARISON:  Head CT 12/10/2012.  FINDINGS: CT HEAD FINDINGS  Soft tissue swelling in the high left frontal scalp, and in the high parieto-occipital region in the midline, compatible with scalp contusion. Moderate cerebral and mild cerebellar atrophy. Multiple well-defined foci of low attenuation in the cerebellar hemispheres bilaterally, compatible with multifocal old lacunar infarctions. Patchy and confluent areas of decreased attenuation are noted throughout the deep and periventricular white matter of the cerebral hemispheres bilaterally, compatible with chronic microvascular ischemic disease. No acute displaced skull fractures are identified. No acute intracranial abnormality. Specifically, no evidence of acute post-traumatic  intracranial hemorrhage, no definite regions of acute/subacute cerebral ischemia, no focal mass, mass effect, hydrocephalus or abnormal intra or extra-axial fluid collections. The visualized paranasal sinuses and mastoids are well pneumatized.  CT CERVICAL SPINE FINDINGS  No acute displaced fractures of the cervical spine. Alignment is anatomic. Prevertebral soft tissues are normal. Severe multilevel degenerative disc disease, most pronounced at C5-C6 and C6-C7. Moderate multilevel facet arthropathy. Visualized portions of the upper thorax are unremarkable.  IMPRESSION: 1. Multifocal scalp contusion, as above. No underlying  displaced skull fracture or findings to suggest significant acute traumatic injury to the brain. 2. No evidence of significant acute traumatic injury to the cervical spine. 3. Moderate cerebral and mild cerebellar atrophy with extensive chronic microvascular ischemic changes in the cerebral white matter and multifocal old cerebellar lacunar infarctions, as above. 4. Multilevel degenerative disc disease and cervical spondylosis, as above.   Electronically Signed   By: Vinnie Langton M.D.   On: 12/29/2013 20:00   Ct Cervical Spine Wo Contrast  12/29/2013   CLINICAL DATA:  History of trauma from a fall with injury to the head.  EXAM: CT HEAD WITHOUT CONTRAST  CT CERVICAL SPINE WITHOUT CONTRAST  TECHNIQUE: Multidetector CT imaging of the head and cervical spine was performed following the standard protocol without intravenous contrast. Multiplanar CT image reconstructions of the cervical spine were also generated.  COMPARISON:  Head CT 12/10/2012.  FINDINGS: CT HEAD FINDINGS  Soft tissue swelling in the high left frontal scalp, and in the high parieto-occipital region in the midline, compatible with scalp contusion. Moderate cerebral and mild cerebellar atrophy. Multiple well-defined foci of low attenuation in the cerebellar hemispheres bilaterally, compatible with multifocal old lacunar  infarctions. Patchy and confluent areas of decreased attenuation are noted throughout the deep and periventricular white matter of the cerebral hemispheres bilaterally, compatible with chronic microvascular ischemic disease. No acute displaced skull fractures are identified. No acute intracranial abnormality. Specifically, no evidence of acute post-traumatic intracranial hemorrhage, no definite regions of acute/subacute cerebral ischemia, no focal mass, mass effect, hydrocephalus or abnormal intra or extra-axial fluid collections. The visualized paranasal sinuses and mastoids are well pneumatized.  CT CERVICAL SPINE FINDINGS  No acute displaced fractures of the cervical spine. Alignment is anatomic. Prevertebral soft tissues are normal. Severe multilevel degenerative disc disease, most pronounced at C5-C6 and C6-C7. Moderate multilevel facet arthropathy. Visualized portions of the upper thorax are unremarkable.  IMPRESSION: 1. Multifocal scalp contusion, as above. No underlying displaced skull fracture or findings to suggest significant acute traumatic injury to the brain. 2. No evidence of significant acute traumatic injury to the cervical spine. 3. Moderate cerebral and mild cerebellar atrophy with extensive chronic microvascular ischemic changes in the cerebral white matter and multifocal old cerebellar lacunar infarctions, as above. 4. Multilevel degenerative disc disease and cervical spondylosis, as above.   Electronically Signed   By: Vinnie Langton M.D.   On: 12/29/2013 20:00       EKG Interpretation None      MDM   Final diagnoses:  Mild dementia  Parkinson's disease  Head injury, initial encounter  Scalp hematoma, initial encounter  Scalp abrasion, initial encounter  Scalp laceration, initial encounter  Radial head fracture, closed, left, initial encounter  Elbow abrasion, left, initial encounter  Elbow laceration, right, initial encounter   The patient with history of mild dementia  and Parkinson's disease. Patient has some gait problems patient fell backwards at home unwitnessed by wife. Patient murmurs going down though no loss of consciousness did not pass out. Patient struck the back of his head shows a lot of blood on the floor of the condyle floor. Patient brought in by EMS. Patient's complaints were really of no head pain even though you have expected. Denied any neck pain but he was in a cervical collar. Patient complain of some lumbar back pain.  Workup included CT head and neck no skull fracture no intracranial injury. No cervical bony injuries. X-rays a lumbar area were negative for any acute injuries. X-rays of  both hips and pelvis were negative. X-rays of both elbows showed an impacted radial head fracture on the left right side elbow was normal.  Both elbows head wounds left elbow had a abrasion which will be treated with dressing and wound care. Right elbow had a 1 cm laceration. Which also heal fine by secondary intention. No deep structure involvement. X-rays of the left elbow showed an impacted the radial head fracture that'll be treated with a sugar tong splint and a sling. Followup with orthopedics. The scalp and elbows will refer to general surgery for wound care. The scalp has an abrasion on the top of the superficial laceration nothing to suture or staple. This will be dressed with Xeroform and the Kerlix. Same goes for the hematoma on the back of the head really nothing to suture unable to express the clot out this will liquefy and that will work as a biological dressing for the skin on top of the skin on top we'll most likely not remain viable. 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 This will be dressed with Xeroform and Kerlix. As stated above patient will followup with Gen. surgery 4 continue wound care can return to the emergency department.  Patient mentally is been alert. Has had minimal pain but really no pain to the left elbow. Most complaint is been to  some low back pain.   I personally performed the services described in this documentation, which was scribed in my presence. The recorded information has been reviewed and is accurate.    Fredia Sorrow, MD 12/29/13 2303

## 2013-12-29 NOTE — ED Notes (Signed)
Per EMS - pt fell today on concrete floor, denies LOC.  Pt has large hematoma to back of head, laceration to head and skin tear to left elbow.  Pt alert and oriented x 4.  c-collar and lsb in place upon arrival.

## 2013-12-30 ENCOUNTER — Telehealth: Payer: Self-pay | Admitting: Diagnostic Neuroimaging

## 2013-12-30 DIAGNOSIS — G20 Parkinson's disease: Secondary | ICD-10-CM

## 2013-12-30 DIAGNOSIS — F0390 Unspecified dementia without behavioral disturbance: Secondary | ICD-10-CM

## 2013-12-30 NOTE — Telephone Encounter (Signed)
Pt's wife [name] called states pt has fallen several more times and has requested that pt receive a bedside commode. [facility] needs an order from Dr. Leta Baptist, pt didn't know their fax # but she gave me their work # [phone]. Please call [name] back for any other questions and also she would like for Dr. Leta Baptist or his nurse to call her back to let them know if this will be approved and [name] would like to see if [facility] can deliver to their home address which is the [employer] address. Thanks

## 2013-12-30 NOTE — Telephone Encounter (Signed)
Wife called back and states he needs this addressed today please.

## 2013-12-30 NOTE — Telephone Encounter (Signed)
Gave to [place] to place order, ok'd by Dr Leta Baptist, will fax to [facility] and they will bring the commode out to patient's house according to wife. Patient had a bad fall, was hospitalized and is home now but cannot get in and out of bed.

## 2013-12-30 NOTE — Telephone Encounter (Signed)
Order placed and faxed [phone].

## 2014-01-02 ENCOUNTER — Telehealth: Payer: Self-pay | Admitting: Orthopedic Surgery

## 2014-01-02 DIAGNOSIS — S0190XA Unspecified open wound of unspecified part of head, initial encounter: Secondary | ICD-10-CM | POA: Diagnosis not present

## 2014-01-02 DIAGNOSIS — W19XXXA Unspecified fall, initial encounter: Secondary | ICD-10-CM | POA: Diagnosis not present

## 2014-01-02 DIAGNOSIS — S1093XA Contusion of unspecified part of neck, initial encounter: Secondary | ICD-10-CM | POA: Diagnosis not present

## 2014-01-02 DIAGNOSIS — S0003XA Contusion of scalp, initial encounter: Secondary | ICD-10-CM | POA: Diagnosis not present

## 2014-01-02 DIAGNOSIS — S0083XA Contusion of other part of head, initial encounter: Secondary | ICD-10-CM | POA: Diagnosis not present

## 2014-01-02 DIAGNOSIS — Y92009 Unspecified place in unspecified non-institutional (private) residence as the place of occurrence of the external cause: Secondary | ICD-10-CM | POA: Diagnosis not present

## 2014-01-02 NOTE — Telephone Encounter (Addendum)
I called back to patient to relay per Dr. Ruthe Mannan response, no answer and no answer machine.  Keep trying. * Reached patient's wife at cell# [phone], and patient and wife will be there at [facility], Wed, 01/04/14, 12:00noon, and will have them call when they arrive, as per Dr Harrison's note of 01/02/14.

## 2014-01-02 NOTE — Telephone Encounter (Signed)
Patient and wife called (patient's wife states he has difficulty on phone due to Parkinson's) - requesting appointment for arm/elbow fracture, related to a fall 12/29/13, treated at [facility] Emergency room.  Several Xrays were done at this time, and they request to see Dr Aline Brochure (aware he was not on call at the time for: (Copied and pasted from chart note):     12/29/2013 CLINICAL DATA: Fall. Left elbow pain. EXAM: LEFT ELBOW - COMPLETE 3+ VIEW COMPARISON: Report from 03/30/2003 FINDINGS: Acute fracture of the lateral third of the radial head with a 1.9 mm step-off at the fracture site. Anterior fat pad is obscured, favoring elbow joint effusion. I do not observe an additional fracture. There is mild spurring of the coronoid process. IMPRESSION: 1. Mildly impacted fracture of the lateral third of the radial head. Suspected elbow joint effusion. Electronically Signed By: Sherryl Barters M.D. On: 12/29/2013 20:53   I offered appointment, and patient's wife states that patient cannot walk or stand, and mentioned having "EMS" transport him.  I relayed that this may not be workable, based on access into our office, and the need for patient to be in wheelchair to access office.  Please review and please advise.  Patient home ph [phone].

## 2014-01-02 NOTE — Telephone Encounter (Signed)
i can see him Wednesday in ER at 12noon Have er call me when he arrives

## 2014-01-03 ENCOUNTER — Inpatient Hospital Stay (HOSPITAL_COMMUNITY)
Admission: EM | Admit: 2014-01-03 | Discharge: 2014-01-07 | DRG: 640 | Disposition: A | Discharge status: Skilled Nursing Facility | Payer: Medicare Other | Attending: Internal Medicine | Admitting: Internal Medicine

## 2014-01-03 ENCOUNTER — Encounter (HOSPITAL_COMMUNITY): Payer: Self-pay | Admitting: Emergency Medicine

## 2014-01-03 ENCOUNTER — Emergency Department (HOSPITAL_COMMUNITY): Payer: Medicare Other

## 2014-01-03 DIAGNOSIS — Z9181 History of falling: Secondary | ICD-10-CM

## 2014-01-03 DIAGNOSIS — F0390 Unspecified dementia without behavioral disturbance: Secondary | ICD-10-CM | POA: Diagnosis present

## 2014-01-03 DIAGNOSIS — Z8546 Personal history of malignant neoplasm of prostate: Secondary | ICD-10-CM | POA: Diagnosis not present

## 2014-01-03 DIAGNOSIS — G629 Polyneuropathy, unspecified: Secondary | ICD-10-CM

## 2014-01-03 DIAGNOSIS — M6281 Muscle weakness (generalized): Secondary | ICD-10-CM | POA: Diagnosis not present

## 2014-01-03 DIAGNOSIS — Z800 Family history of malignant neoplasm of digestive organs: Secondary | ICD-10-CM | POA: Diagnosis not present

## 2014-01-03 DIAGNOSIS — J189 Pneumonia, unspecified organism: Secondary | ICD-10-CM | POA: Diagnosis not present

## 2014-01-03 DIAGNOSIS — M545 Low back pain, unspecified: Secondary | ICD-10-CM | POA: Diagnosis present

## 2014-01-03 DIAGNOSIS — R269 Unspecified abnormalities of gait and mobility: Secondary | ICD-10-CM

## 2014-01-03 DIAGNOSIS — Z8673 Personal history of transient ischemic attack (TIA), and cerebral infarction without residual deficits: Secondary | ICD-10-CM | POA: Diagnosis not present

## 2014-01-03 DIAGNOSIS — E876 Hypokalemia: Secondary | ICD-10-CM | POA: Diagnosis not present

## 2014-01-03 DIAGNOSIS — E119 Type 2 diabetes mellitus without complications: Secondary | ICD-10-CM | POA: Diagnosis present

## 2014-01-03 DIAGNOSIS — K567 Ileus, unspecified: Secondary | ICD-10-CM | POA: Diagnosis present

## 2014-01-03 DIAGNOSIS — S069XAA Unspecified intracranial injury with loss of consciousness status unknown, initial encounter: Secondary | ICD-10-CM | POA: Diagnosis not present

## 2014-01-03 DIAGNOSIS — G20 Parkinson's disease: Secondary | ICD-10-CM

## 2014-01-03 DIAGNOSIS — K560 Paralytic ileus: Secondary | ICD-10-CM

## 2014-01-03 DIAGNOSIS — I10 Essential (primary) hypertension: Secondary | ICD-10-CM | POA: Diagnosis present

## 2014-01-03 DIAGNOSIS — R6889 Other general symptoms and signs: Secondary | ICD-10-CM | POA: Diagnosis not present

## 2014-01-03 DIAGNOSIS — R319 Hematuria, unspecified: Secondary | ICD-10-CM | POA: Diagnosis present

## 2014-01-03 DIAGNOSIS — E860 Dehydration: Secondary | ICD-10-CM

## 2014-01-03 DIAGNOSIS — G20A1 Parkinson's disease without dyskinesia, without mention of fluctuations: Secondary | ICD-10-CM | POA: Diagnosis present

## 2014-01-03 DIAGNOSIS — Z87891 Personal history of nicotine dependence: Secondary | ICD-10-CM | POA: Diagnosis not present

## 2014-01-03 DIAGNOSIS — S3981XA Other specified injuries of abdomen, initial encounter: Secondary | ICD-10-CM | POA: Diagnosis not present

## 2014-01-03 DIAGNOSIS — R627 Adult failure to thrive: Secondary | ICD-10-CM | POA: Diagnosis present

## 2014-01-03 DIAGNOSIS — R5381 Other malaise: Secondary | ICD-10-CM | POA: Diagnosis not present

## 2014-01-03 DIAGNOSIS — R7989 Other specified abnormal findings of blood chemistry: Secondary | ICD-10-CM | POA: Diagnosis not present

## 2014-01-03 DIAGNOSIS — R262 Difficulty in walking, not elsewhere classified: Secondary | ICD-10-CM | POA: Diagnosis not present

## 2014-01-03 DIAGNOSIS — R279 Unspecified lack of coordination: Secondary | ICD-10-CM | POA: Diagnosis not present

## 2014-01-03 DIAGNOSIS — G8929 Other chronic pain: Secondary | ICD-10-CM | POA: Diagnosis present

## 2014-01-03 DIAGNOSIS — S069X9A Unspecified intracranial injury with loss of consciousness of unspecified duration, initial encounter: Secondary | ICD-10-CM | POA: Diagnosis not present

## 2014-01-03 DIAGNOSIS — S52123A Displaced fracture of head of unspecified radius, initial encounter for closed fracture: Secondary | ICD-10-CM | POA: Diagnosis not present

## 2014-01-03 DIAGNOSIS — Z8052 Family history of malignant neoplasm of bladder: Secondary | ICD-10-CM

## 2014-01-03 DIAGNOSIS — IMO0001 Reserved for inherently not codable concepts without codable children: Secondary | ICD-10-CM | POA: Diagnosis not present

## 2014-01-03 DIAGNOSIS — E871 Hypo-osmolality and hyponatremia: Principal | ICD-10-CM | POA: Diagnosis present

## 2014-01-03 DIAGNOSIS — S52122A Displaced fracture of head of left radius, initial encounter for closed fracture: Secondary | ICD-10-CM

## 2014-01-03 DIAGNOSIS — M129 Arthropathy, unspecified: Secondary | ICD-10-CM | POA: Diagnosis present

## 2014-01-03 DIAGNOSIS — F03A0 Unspecified dementia, mild, without behavioral disturbance, psychotic disturbance, mood disturbance, and anxiety: Secondary | ICD-10-CM

## 2014-01-03 DIAGNOSIS — R41841 Cognitive communication deficit: Secondary | ICD-10-CM | POA: Diagnosis not present

## 2014-01-03 HISTORY — DX: Other chronic pain: G89.29

## 2014-01-03 HISTORY — DX: Unspecified dementia without behavioral disturbance: F03.90

## 2014-01-03 HISTORY — DX: Repeated falls: R29.6

## 2014-01-03 HISTORY — DX: Unspecified dementia, mild, without behavioral disturbance, psychotic disturbance, mood disturbance, and anxiety: F03.A0

## 2014-01-03 HISTORY — DX: Radiculopathy, lumbar region: M54.16

## 2014-01-03 HISTORY — DX: Dorsalgia, unspecified: M54.9

## 2014-01-03 HISTORY — DX: Polyneuropathy, unspecified: G62.9

## 2014-01-03 LAB — COMPREHENSIVE METABOLIC PANEL
AST: 22 U/L (ref 0–37)
Albumin: 3.5 g/dL (ref 3.5–5.2)
Alkaline Phosphatase: 61 U/L (ref 39–117)
BUN: 7 mg/dL (ref 6–23)
CHLORIDE: 80 meq/L — AB (ref 96–112)
CO2: 26 mEq/L (ref 19–32)
Calcium: 9.4 mg/dL (ref 8.4–10.5)
Creatinine, Ser: 0.42 mg/dL — ABNORMAL LOW (ref 0.50–1.35)
GLUCOSE: 129 mg/dL — AB (ref 70–99)
Potassium: 3.1 mEq/L — ABNORMAL LOW (ref 3.7–5.3)
SODIUM: 124 meq/L — AB (ref 137–147)
Total Bilirubin: 1.2 mg/dL (ref 0.3–1.2)
Total Protein: 7.4 g/dL (ref 6.0–8.3)

## 2014-01-03 LAB — CBC WITH DIFFERENTIAL/PLATELET
BASOS ABS: 0 10*3/uL (ref 0.0–0.1)
Basophils Relative: 0 % (ref 0–1)
EOS PCT: 1 % (ref 0–5)
Eosinophils Absolute: 0.1 10*3/uL (ref 0.0–0.7)
HCT: 35.3 % — ABNORMAL LOW (ref 39.0–52.0)
Hemoglobin: 12.6 g/dL — ABNORMAL LOW (ref 13.0–17.0)
LYMPHS ABS: 2.2 10*3/uL (ref 0.7–4.0)
LYMPHS PCT: 17 % (ref 12–46)
MCH: 36.8 pg — ABNORMAL HIGH (ref 26.0–34.0)
MCHC: 35.7 g/dL (ref 30.0–36.0)
MCV: 103.2 fL — AB (ref 78.0–100.0)
Monocytes Absolute: 1.7 10*3/uL — ABNORMAL HIGH (ref 0.1–1.0)
Monocytes Relative: 13 % — ABNORMAL HIGH (ref 3–12)
NEUTROS ABS: 8.8 10*3/uL — AB (ref 1.7–7.7)
Neutrophils Relative %: 69 % (ref 43–77)
PLATELETS: 238 10*3/uL (ref 150–400)
RBC: 3.42 MIL/uL — AB (ref 4.22–5.81)
RDW: 12.3 % (ref 11.5–15.5)
WBC: 12.8 10*3/uL — AB (ref 4.0–10.5)

## 2014-01-03 LAB — URINALYSIS, ROUTINE W REFLEX MICROSCOPIC
GLUCOSE, UA: NEGATIVE mg/dL
Ketones, ur: >80 mg/dL — AB
LEUKOCYTES UA: NEGATIVE
Nitrite: NEGATIVE
SPECIFIC GRAVITY, URINE: 1.02 (ref 1.005–1.030)
Urobilinogen, UA: 0.2 mg/dL (ref 0.0–1.0)
pH: 6.5 (ref 5.0–8.0)

## 2014-01-03 LAB — TROPONIN I

## 2014-01-03 LAB — LIPASE, BLOOD: Lipase: 38 U/L (ref 11–59)

## 2014-01-03 LAB — URINE MICROSCOPIC-ADD ON

## 2014-01-03 LAB — LACTIC ACID, PLASMA: LACTIC ACID, VENOUS: 3.1 mmol/L — AB (ref 0.5–2.2)

## 2014-01-03 MED ORDER — SODIUM CHLORIDE 0.9 % IV SOLN: INTRAVENOUS | Status: DC

## 2014-01-03 MED ORDER — ASPIRIN EC 81 MG PO TBEC
81.0000 mg | DELAYED_RELEASE_TABLET | Freq: Every day | ORAL | Status: DC
  Administered 2014-01-05 – 2014-01-07 (×3): 81 mg via ORAL
  Filled 2014-01-03 (×4): qty 1

## 2014-01-03 MED ORDER — POTASSIUM CHLORIDE IN NACL 20-0.9 MEQ/L-% IV SOLN
INTRAVENOUS | Status: AC
  Administered 2014-01-03: via INTRAVENOUS

## 2014-01-03 MED ORDER — DOXYCYCLINE HYCLATE 100 MG PO TABS
100.0000 mg | ORAL_TABLET | Freq: Once | ORAL | Status: AC
  Administered 2014-01-03: 100 mg via ORAL
  Filled 2014-01-03: qty 1

## 2014-01-03 MED ORDER — CARBIDOPA-LEVODOPA 25-100 MG PO TABS
2.0000 | ORAL_TABLET | Freq: Three times a day (TID) | ORAL | Status: DC
  Administered 2014-01-04 – 2014-01-07 (×10): 2 via ORAL
  Filled 2014-01-03: qty 1
  Filled 2014-01-03 (×3): qty 2
  Filled 2014-01-03: qty 1
  Filled 2014-01-03: qty 2
  Filled 2014-01-03: qty 1
  Filled 2014-01-03 (×2): qty 2
  Filled 2014-01-03: qty 1
  Filled 2014-01-03 (×2): qty 2
  Filled 2014-01-03: qty 1

## 2014-01-03 MED ORDER — AZITHROMYCIN 250 MG PO TABS
250.0000 mg | ORAL_TABLET | Freq: Every day | ORAL | Status: DC
  Administered 2014-01-05 – 2014-01-07 (×3): 250 mg via ORAL
  Filled 2014-01-03 (×3): qty 1

## 2014-01-03 MED ORDER — DEXTROSE 5 % IV SOLN
1.0000 g | Freq: Once | INTRAVENOUS | Status: AC
  Administered 2014-01-03: 1 g via INTRAVENOUS
  Filled 2014-01-03: qty 10

## 2014-01-03 MED ORDER — SODIUM CHLORIDE 0.9 % IV SOLN
INTRAVENOUS | Status: DC
  Administered 2014-01-03: 1000 mL via INTRAVENOUS

## 2014-01-03 MED ORDER — AZITHROMYCIN 250 MG PO TABS
500.0000 mg | ORAL_TABLET | Freq: Every day | ORAL | Status: AC
  Administered 2014-01-04: 500 mg via ORAL
  Filled 2014-01-03: qty 2

## 2014-01-03 MED ORDER — DEXTROSE 5 % IV SOLN
1.0000 g | INTRAVENOUS | Status: DC
  Administered 2014-01-04 – 2014-01-06 (×3): 1 g via INTRAVENOUS
  Filled 2014-01-03 (×5): qty 10

## 2014-01-03 NOTE — ED Notes (Signed)
Pt able to stand w/ walker but unable to walk.

## 2014-01-03 NOTE — ED Notes (Signed)
Pt states he feels like himself when sitting still. States his back hurts when he moves. Pt denies weakness in his legs being any worse than normal.

## 2014-01-03 NOTE — ED Provider Notes (Signed)
CSN: [id number]     Arrival date & time 01/03/14  1940 History   First MD Initiated Contact with Patient 01/03/14 1941     Chief Complaint  Patient presents with  . Fall  . Weakness      HPI Pt was seen at 1950.  Per EMS, General Surgeon Dr. Hulan Amato report and pt: c/o gradual onset and worsening of persistent generalized weakness for the past 5 days. Pt has hx of Parkinson's and previous CVA, walks with a walker, and states "I fall a lot." Pt was evaluated in the ED for a fall 5 days ago, dx multiple abrasions, left radius fracture (tx with sugar tong splint/sling), and instructed to f/u Ortho MD and General Surgeon. General Surgeon came to pt's house today to evaluate pt's abrasions PTA and noted pt was "sitting in the same chair for the past 3 days," "unable to get up and walk" due to generalized weakness, as well as "he took off his arm splint." General Surgeon dressed pt's wounds, and requested EMS transport to the ED for further evaluation and admission. Pt himself states he "can't remember things that well" but denies new fall since previous ED evaluation. Pt only c/o acute flair of his chronic low back pain for an unknown period of time. Denies fevers, no CP/palpitations, no cough/SOB, no abd pain, no N/V/D, no syncope.      Past Medical History  Diagnosis Date  . Hypertension   . Prostate cancer   . Weakness of both legs   . Stroke 1982  . Wears glasses   . Arthritis   . Benign neoplasm of colon   . History of right bundle branch block   . Diabetes   . Mild dementia   . Neuropathy   . Frequent falls 2012  . Chronic back pain   . Lumbar radiculopathy    Past Surgical History  Procedure Laterality Date  . Eye surgery  2010    both cataracts  . Colonoscopy    . Insertion prostate radiation seed  2001  . Mohs surgery  12/12/2008   Family History  Problem Relation Age of Onset  . Colon cancer Sister   . Bladder Cancer Father   . Bronchopulmonary dysplasia Mother     History  Substance Use Topics  . Smoking status: Former Smoker    Quit date: 06/08/1969  . Smokeless tobacco: Never Used  . Alcohol Use: Yes     Comment: daily x2 now    Review of Systems ROS: Statement: All systems negative except as marked or noted in the HPI; Constitutional: Negative for fever and chills. +generalized weakness. ; ; Eyes: Negative for eye pain, redness and discharge. ; ; ENMT: Negative for ear pain, hoarseness, nasal congestion, sinus pressure and sore throat. ; ; Cardiovascular: Negative for chest pain, palpitations, diaphoresis, dyspnea and peripheral edema. ; ; Respiratory: Negative for cough, wheezing and stridor. ; ; Gastrointestinal: Negative for nausea, vomiting, diarrhea, abdominal pain, blood in stool, hematemesis, jaundice and rectal bleeding. . ; ; Genitourinary: Negative for dysuria, flank pain and hematuria. ; ; Musculoskeletal: +LBP. Negative for neck pain. Negative for swelling and deformity.; ; Skin: +abrasions. Negative for pruritus, rash, blisters, bruising and skin lesion.; ; Neuro: Negative for headache, lightheadedness and neck stiffness. Negative for altered level of consciousness , altered mental status, extremity weakness, paresthesias, involuntary movement, seizure and syncope.      Allergies  Review of patient's allergies indicates no known allergies.  Home Medications  Prior to Admission medications   Medication Sig Start Date End Date Taking? Authorizing Provider  aspirin EC 81 MG tablet Take 81 mg by mouth daily.   Yes Historical Provider, MD  benazepril (LOTENSIN) 40 MG tablet Take 1 tablet by mouth every morning.  10/04/13  Yes Historical Provider, MD  carbidopa-levodopa (SINEMET IR) 25-100 MG per tablet Take 2 tablets by mouth 3 (three) times daily. 11/25/13  Yes Penni Bombard, MD  hydrochlorothiazide (HYDRODIURIL) 25 MG tablet Take 25 mg by mouth every morning.    Yes Historical Provider, MD  metFORMIN (GLUCOPHAGE-XR) 500 MG 24 hr  tablet Take 500 mg by mouth daily.  09/29/13  Yes Historical Provider, MD  Multiple Vitamins-Minerals (CENTRUM SILVER ADULT 50+ PO) Take 1 tablet by mouth every morning.   Yes Historical Provider, MD  vitamin B-12 (CYANOCOBALAMIN) 1000 MCG tablet Take 1,000 mcg by mouth every morning.   Yes Historical Provider, MD   BP 139/57  Pulse 78  Temp(Src) 97.4 F (36.3 C) (Oral)  Resp 18  SpO2 95% Physical Exam 1955: Physical examination:  Nursing notes reviewed; Vital signs and O2 SAT reviewed;  Constitutional: Well developed, Well nourished, In no acute distress; Head:  Normocephalic, +large DSD on vertex scalp with fading scalp ecchymosis.; Eyes: EOMI, PERRL, No scleral icterus; ENMT: Mouth and pharynx normal, Mucous membranes dry; Neck: Supple, Full range of motion, No lymphadenopathy; Cardiovascular: Regular rate and rhythm, No gallop; Respiratory: Breath sounds clear & equal bilaterally, No wheezes.  Speaking full sentences with ease, Normal respiratory effort/excursion; Chest: Nontender, Movement normal; Abdomen: Nontender, +softly distended, tympanitic. Decreased bowel sounds; Genitourinary: No CVA tenderness; Spine:  No midline CS, TS, LS tenderness. +mild TTP right lumbar paraspinal muscles. No rash. No ecchymosis.;; Extremities: Pulses normal, Pelvis stable. No deformity. Chronic stasis changes bilat LE's. No calf edema or asymmetry.; Neuro: Awake, alert, confused re: events, vague historian. Major CN grossly intact. No facial doop Speech clear. Moves all extremities on stretcher spontaneously.; Skin: Color normal, Warm, Dry.    ED Course  Procedures    EKG Interpretation   Date/Time:  Tuesday January 03 2014 20:27:48 EDT Ventricular Rate:  77 PR Interval:  169 QRS Duration: 156 QT Interval:  430 QTC Calculation: 487 R Axis:   49 Text Interpretation:  Sinus rhythm Right bundle branch block When compared  with ECG of 12/29/2013 No significant change was found Confirmed by  [facility]  MD,  Nunzio Cory [phone]) on 01/03/2014 8:52:50 PM      MDM  MDM Reviewed: previous chart, nursing note and vitals Reviewed previous: labs, ECG, x-ray and CT scan Interpretation: labs, ECG and x-ray Total time providing critical care: 30-74 minutes. This excludes time spent performing separately reportable procedures and services. Consults: admitting MD   CRITICAL CARE Performed by: Alfonzo Feller Total critical care time: 35 Critical care time was exclusive of separately billable procedures and treating other patients. Critical care was necessary to treat or prevent imminent or life-threatening deterioration. Critical care was time spent personally by me on the following activities: development of treatment plan with patient and/or surrogate as well as nursing, discussions with consultants, evaluation of patient's response to treatment, examination of patient, obtaining history from patient or surrogate, ordering and performing treatments and interventions, ordering and review of laboratory studies, ordering and review of radiographic studies, pulse oximetry and re-evaluation of patient's condition.   Results for orders placed during the hospital encounter of 01/03/14  URINALYSIS, ROUTINE W REFLEX MICROSCOPIC      Result Value  Ref Range   Color, Urine YELLOW  YELLOW   APPearance CLEAR  CLEAR   Specific Gravity, Urine 1.020  1.005 - 1.030   pH 6.5  5.0 - 8.0   Glucose, UA NEGATIVE  NEGATIVE mg/dL   Hgb urine dipstick LARGE (*) NEGATIVE   Bilirubin Urine SMALL (*) NEGATIVE   Ketones, ur >80 (*) NEGATIVE mg/dL   Protein, ur TRACE (*) NEGATIVE mg/dL   Urobilinogen, UA 0.2  0.0 - 1.0 mg/dL   Nitrite NEGATIVE  NEGATIVE   Leukocytes, UA NEGATIVE  NEGATIVE  CBC WITH DIFFERENTIAL      Result Value Ref Range   WBC 12.8 (*) 4.0 - 10.5 K/uL   RBC 3.42 (*) 4.22 - 5.81 MIL/uL   Hemoglobin 12.6 (*) 13.0 - 17.0 g/dL   HCT 35.3 (*) 39.0 - 52.0 %   MCV 103.2 (*) 78.0 - 100.0 fL   MCH 36.8 (*)  26.0 - 34.0 pg   MCHC 35.7  30.0 - 36.0 g/dL   RDW 12.3  11.5 - 15.5 %   Platelets 238  150 - 400 K/uL   Neutrophils Relative % 69  43 - 77 %   Neutro Abs 8.8 (*) 1.7 - 7.7 K/uL   Lymphocytes Relative 17  12 - 46 %   Lymphs Abs 2.2  0.7 - 4.0 K/uL   Monocytes Relative 13 (*) 3 - 12 %   Monocytes Absolute 1.7 (*) 0.1 - 1.0 K/uL   Eosinophils Relative 1  0 - 5 %   Eosinophils Absolute 0.1  0.0 - 0.7 K/uL   Basophils Relative 0  0 - 1 %   Basophils Absolute 0.0  0.0 - 0.1 K/uL  LACTIC ACID, PLASMA      Result Value Ref Range   Lactic Acid, Venous 3.1 (*) 0.5 - 2.2 mmol/L  TROPONIN I      Result Value Ref Range   Troponin I <0.30  <0.30 ng/mL  COMPREHENSIVE METABOLIC PANEL      Result Value Ref Range   Sodium 124 (*) 137 - 147 mEq/L   Potassium 3.1 (*) 3.7 - 5.3 mEq/L   Chloride 80 (*) 96 - 112 mEq/L   CO2 26  19 - 32 mEq/L   Glucose, Bld 129 (*) 70 - 99 mg/dL   BUN 7  6 - 23 mg/dL   Creatinine, Ser 0.42 (*) 0.50 - 1.35 mg/dL   Calcium 9.4  8.4 - 10.5 mg/dL   Total Protein 7.4  6.0 - 8.3 g/dL   Albumin 3.5  3.5 - 5.2 g/dL   AST 22  0 - 37 U/L   ALT <5  0 - 53 U/L   Alkaline Phosphatase 61  39 - 117 U/L   Total Bilirubin 1.2  0.3 - 1.2 mg/dL   GFR calc non Af Amer >90  >90 mL/min   GFR calc Af Amer >90  >90 mL/min  LIPASE, BLOOD      Result Value Ref Range   Lipase 38  11 - 59 U/L  URINE MICROSCOPIC-ADD ON      Result Value Ref Range   Squamous Epithelial / LPF RARE  RARE   WBC, UA 3-6  <3 WBC/hpf   RBC / HPF TOO NUMEROUS TO COUNT  <3 RBC/hpf   Bacteria, UA FEW (*) RARE   Dg Abd Acute W/chest 01/03/2014   CLINICAL DATA:  Back pain, weakness and abdominal distention after falling 5 days ago.  EXAM: ACUTE ABDOMEN SERIES (ABDOMEN 2  VIEW & CHEST 1 VIEW)  COMPARISON:  Lumbar spine radiographs 12/29/2013. Two view chest 12/10/2012.  FINDINGS: There are lower lung volumes with new left lower lobe patchy airspace disease. There may be a small left pleural effusion. No pneumothorax  is demonstrated. The heart size and mediastinal contours are normal. Lower rib fractures are noted bilaterally, age indeterminate.  Abdominal radiographs demonstrate mild diffuse small bowel distension. There is gas throughout the colon which is not dilated. There is no free intraperitoneal air. Prostate brachytherapy seeds are noted. Mild degenerative changes are present within the lumbar spine.  IMPRESSION: 1. Mild small bowel distention, favored to reflect an ileus. No evidence of high-grade mechanical obstruction. 2. New left basilar airspace disease and possible left pleural effusion. Age indeterminate rib fractures bilaterally.   Electronically Signed   By: Camie Patience M.D.   On: 01/03/2014 21:19     2145:  Pt not orthostatic on VS, but was unable to walk even with his walker.  Hyponatremic on labs, will dose judicious IVF. Sugar tong splint reapplied LUE. LLL airspace disease on CXR, with elevated WBC count and lactic acid level; will tx with IV abx for CAP. Dx and testing d/w pt.  Questions answered.  Verb understanding, agreeable to admit.  T/C to [facility] Dr. Shanon Brow, case discussed, including:  HPI, pertinent PM/SHx, VS/PE, dx testing, ED course and treatment:  Agreeable to admit, requests to write temporary orders, obtain observation medical bed to Dr. Ria Comment service.     Alfonzo Feller, DO 01/05/14 [phone]

## 2014-01-03 NOTE — ED Notes (Signed)
Pt sent the ER after seeing Dr Romona Curls. Pt fell & was seen here on the 25th. Pt states cant walk & back hurting.

## 2014-01-03 NOTE — H&P (Signed)
PCP:   Asencion Noble, MD   Chief Complaint:  weak  HPI: 80 yo male fell 5 days injured his left arm and back was checked on by dr bradford today at his house, as pt could not get out of the house.  Since his fall, he has been unable to get up out of his chair much.  Before the fall, he barely ambulated with walker, now he cannot even do that.  Dr Romona Curls had him come to the ED to be admitted.  Pt has been not eating or drinking well.  He is dehydrated.  He lives with his elderly wife.  No fevers.  No swelling in his legs.  No abd pain.  Only pain is in his lower back.  Able to move his legs, but overall weak.  No n/v/d.  No cough.  Review of Systems:  Positive and negative as per HPI otherwise all other systems are negative  Past Medical History: Past Medical History  Diagnosis Date  . Hypertension   . Prostate cancer   . Weakness of both legs   . Stroke 1982  . Wears glasses   . Arthritis   . Benign neoplasm of colon   . History of right bundle branch block   . Diabetes   . Mild dementia   . Neuropathy   . Frequent falls 2012  . Chronic back pain   . Lumbar radiculopathy    Past Surgical History  Procedure Laterality Date  . Eye surgery  2010    both cataracts  . Colonoscopy    . Insertion prostate radiation seed  2001  . Mohs surgery  12/12/2008    Medications: Prior to Admission medications   Medication Sig Start Date End Date Taking? Authorizing Provider  aspirin EC 81 MG tablet Take 81 mg by mouth daily.   Yes Historical Provider, MD  benazepril (LOTENSIN) 40 MG tablet Take 1 tablet by mouth every morning.  10/04/13  Yes Historical Provider, MD  carbidopa-levodopa (SINEMET IR) 25-100 MG per tablet Take 2 tablets by mouth 3 (three) times daily. 11/25/13  Yes Penni Bombard, MD  hydrochlorothiazide (HYDRODIURIL) 25 MG tablet Take 25 mg by mouth every morning.    Yes Historical Provider, MD  metFORMIN (GLUCOPHAGE-XR) 500 MG 24 hr tablet Take 500 mg by mouth daily.   09/29/13  Yes Historical Provider, MD  Multiple Vitamins-Minerals (CENTRUM SILVER ADULT 50+ PO) Take 1 tablet by mouth every morning.   Yes Historical Provider, MD  vitamin B-12 (CYANOCOBALAMIN) 1000 MCG tablet Take 1,000 mcg by mouth every morning.   Yes Historical Provider, MD    Allergies:  No Known Allergies  Social History:  reports that he quit smoking about 44 years ago. He has never used smokeless tobacco. He reports that he drinks alcohol. He reports that he does not use illicit drugs.  Family History: Family History  Problem Relation Age of Onset  . Colon cancer Sister   . Bladder Cancer Father   . Bronchopulmonary dysplasia Mother     Physical Exam: Filed Vitals:   01/03/14 1943  BP: 139/57  Pulse: 78  Temp: 97.4 F (36.3 C)  TempSrc: Oral  Resp: 18  SpO2: 95%   General appearance: alert, cooperative and no distress Head: Normocephalic, without obvious abnormality, atraumatic Eyes: negative Nose: Nares normal. Septum midline. Mucosa normal. No drainage or sinus tenderness. Neck: no JVD and supple, symmetrical, trachea midline Lungs: clear to auscultation bilaterally Heart: regular rate and rhythm, S1, S2 normal,  no murmur, click, rub or gallop Abdomen: soft, non-tender; bowel sounds normal; no masses,  no organomegaly mild distention, no ttp.  Good bs. Extremities: extremities normal, atraumatic, no cyanosis or edema Pulses: 2+ and symmetric Skin: Skin color, texture, turgor normal. No rashes or lesions Neurologic: Grossly normal    Labs on Admission:   Recent Labs  01/03/14 2015  NA 124*  K 3.1*  CL 80*  CO2 26  GLUCOSE 129*  BUN 7  CREATININE 0.42*  CALCIUM 9.4    Recent Labs  01/03/14 2015  AST 22  ALT <5  ALKPHOS 61  BILITOT 1.2  PROT 7.4  ALBUMIN 3.5    Recent Labs  01/03/14 2015  LIPASE 38    Recent Labs  01/03/14 2015  WBC 12.8*  NEUTROABS 8.8*  HGB 12.6*  HCT 35.3*  MCV 103.2*  PLT 238    Recent Labs   01/03/14 2015  TROPONINI <0.30   Radiological Exams on Admission: Dg Lumbar Spine Complete  12/29/2013   CLINICAL DATA:  Fall.  Back pain.  EXAM: LUMBAR SPINE - COMPLETE 4+ VIEW  COMPARISON:  06/16/2012  FINDINGS: Prominent stool throughout the colon favors constipation. Aortoiliac atherosclerotic vascular disease. Bilateral facet arthropathy in the lower lumbar spine. 8 mm of degenerative anterolisthesis at L4-5, similar to the prior exam. Bridging spurring anteriorly in the upper lumbar spine. No fracture or acute bony findings. No definite pars defects at L4.  IMPRESSION: 1. Lower lumbar spondylosis, with grade 1 anterolisthesis at L4-5 similar to prior. 2.  Aortoiliac atherosclerotic vascular disease. 3.  Prominent stool throughout the colon favors constipation.   Electronically Signed   By: Sherryl Barters M.D.   On: 12/29/2013 20:56   Dg Elbow Complete Left  12/29/2013   CLINICAL DATA:  Fall.  Left elbow pain.  EXAM: LEFT ELBOW - COMPLETE 3+ VIEW  COMPARISON:  Report from 03/30/2003  FINDINGS: Acute fracture of the lateral third of the radial head with a 1.9 mm step-off at the fracture site. Anterior fat pad is obscured, favoring elbow joint effusion. I do not observe an additional fracture. There is mild spurring of the coronoid process.  IMPRESSION: 1. Mildly impacted fracture of the lateral third of the radial head. Suspected elbow joint effusion.   Electronically Signed   By: Sherryl Barters M.D.   On: 12/29/2013 20:53   Dg Elbow Complete Right  12/29/2013   CLINICAL DATA:  Fall. Right elbow pain. Contralateral elbow fracture.  EXAM: RIGHT ELBOW - COMPLETE 3+ VIEW  COMPARISON:  None.  FINDINGS: Mild spurring of the coronoid process. Mild spurring at the common flexor tendon attachment site along the medial epicondyle.  No elbow joint effusion. Supinator fat pad appears normal. No fracture seen.  IMPRESSION: 1. No acute bony findings are identified.  No elbow joint effusion.   Electronically  Signed   By: Sherryl Barters M.D.   On: 12/29/2013 20:54   Dg Hip Bilateral W/pelvis  12/29/2013   CLINICAL DATA:  Fall.  Bilateral hip pain.  EXAM: BILATERAL HIP WITH PELVIS - 4+ VIEW  COMPARISON:  Multiple exams, including 09/08/2011  FINDINGS: Prostate seed implants. Moderate degenerative arthropathy of both hips. No fracture or acute findings.  IMPRESSION: 1. Degenerative arthropathy of both hips, without fracture or acute findings. 2. Prostate seed implants noted.   Electronically Signed   By: Sherryl Barters M.D.   On: 12/29/2013 20:51   Ct Head Wo Contrast  12/29/2013   CLINICAL DATA:  History of  trauma from a fall with injury to the head.  EXAM: CT HEAD WITHOUT CONTRAST  CT CERVICAL SPINE WITHOUT CONTRAST  TECHNIQUE: Multidetector CT imaging of the head and cervical spine was performed following the standard protocol without intravenous contrast. Multiplanar CT image reconstructions of the cervical spine were also generated.  COMPARISON:  Head CT 12/10/2012.  FINDINGS: CT HEAD FINDINGS  Soft tissue swelling in the high left frontal scalp, and in the high parieto-occipital region in the midline, compatible with scalp contusion. Moderate cerebral and mild cerebellar atrophy. Multiple well-defined foci of low attenuation in the cerebellar hemispheres bilaterally, compatible with multifocal old lacunar infarctions. Patchy and confluent areas of decreased attenuation are noted throughout the deep and periventricular white matter of the cerebral hemispheres bilaterally, compatible with chronic microvascular ischemic disease. No acute displaced skull fractures are identified. No acute intracranial abnormality. Specifically, no evidence of acute post-traumatic intracranial hemorrhage, no definite regions of acute/subacute cerebral ischemia, no focal mass, mass effect, hydrocephalus or abnormal intra or extra-axial fluid collections. The visualized paranasal sinuses and mastoids are well pneumatized.  CT  CERVICAL SPINE FINDINGS  No acute displaced fractures of the cervical spine. Alignment is anatomic. Prevertebral soft tissues are normal. Severe multilevel degenerative disc disease, most pronounced at C5-C6 and C6-C7. Moderate multilevel facet arthropathy. Visualized portions of the upper thorax are unremarkable.  IMPRESSION: 1. Multifocal scalp contusion, as above. No underlying displaced skull fracture or findings to suggest significant acute traumatic injury to the brain. 2. No evidence of significant acute traumatic injury to the cervical spine. 3. Moderate cerebral and mild cerebellar atrophy with extensive chronic microvascular ischemic changes in the cerebral white matter and multifocal old cerebellar lacunar infarctions, as above. 4. Multilevel degenerative disc disease and cervical spondylosis, as above.   Electronically Signed   By: Vinnie Langton M.D.   On: 12/29/2013 20:00   Ct Cervical Spine Wo Contrast  12/29/2013   CLINICAL DATA:  History of trauma from a fall with injury to the head.  EXAM: CT HEAD WITHOUT CONTRAST  CT CERVICAL SPINE WITHOUT CONTRAST  TECHNIQUE: Multidetector CT imaging of the head and cervical spine was performed following the standard protocol without intravenous contrast. Multiplanar CT image reconstructions of the cervical spine were also generated.  COMPARISON:  Head CT 12/10/2012.  FINDINGS: CT HEAD FINDINGS  Soft tissue swelling in the high left frontal scalp, and in the high parieto-occipital region in the midline, compatible with scalp contusion. Moderate cerebral and mild cerebellar atrophy. Multiple well-defined foci of low attenuation in the cerebellar hemispheres bilaterally, compatible with multifocal old lacunar infarctions. Patchy and confluent areas of decreased attenuation are noted throughout the deep and periventricular white matter of the cerebral hemispheres bilaterally, compatible with chronic microvascular ischemic disease. No acute displaced skull  fractures are identified. No acute intracranial abnormality. Specifically, no evidence of acute post-traumatic intracranial hemorrhage, no definite regions of acute/subacute cerebral ischemia, no focal mass, mass effect, hydrocephalus or abnormal intra or extra-axial fluid collections. The visualized paranasal sinuses and mastoids are well pneumatized.  CT CERVICAL SPINE FINDINGS  No acute displaced fractures of the cervical spine. Alignment is anatomic. Prevertebral soft tissues are normal. Severe multilevel degenerative disc disease, most pronounced at C5-C6 and C6-C7. Moderate multilevel facet arthropathy. Visualized portions of the upper thorax are unremarkable.  IMPRESSION: 1. Multifocal scalp contusion, as above. No underlying displaced skull fracture or findings to suggest significant acute traumatic injury to the brain. 2. No evidence of significant acute traumatic injury to the cervical spine.  3. Moderate cerebral and mild cerebellar atrophy with extensive chronic microvascular ischemic changes in the cerebral white matter and multifocal old cerebellar lacunar infarctions, as above. 4. Multilevel degenerative disc disease and cervical spondylosis, as above.   Electronically Signed   By: Vinnie Langton M.D.   On: 12/29/2013 20:00   Dg Abd Acute W/chest  01/03/2014   CLINICAL DATA:  Back pain, weakness and abdominal distention after falling 5 days ago.  EXAM: ACUTE ABDOMEN SERIES (ABDOMEN 2 VIEW & CHEST 1 VIEW)  COMPARISON:  Lumbar spine radiographs 12/29/2013. Two view chest 12/10/2012.  FINDINGS: There are lower lung volumes with new left lower lobe patchy airspace disease. There may be a small left pleural effusion. No pneumothorax is demonstrated. The heart size and mediastinal contours are normal. Lower rib fractures are noted bilaterally, age indeterminate.  Abdominal radiographs demonstrate mild diffuse small bowel distension. There is gas throughout the colon which is not dilated. There is no  free intraperitoneal air. Prostate brachytherapy seeds are noted. Mild degenerative changes are present within the lumbar spine.  IMPRESSION: 1. Mild small bowel distention, favored to reflect an ileus. No evidence of high-grade mechanical obstruction. 2. New left basilar airspace disease and possible left pleural effusion. Age indeterminate rib fractures bilaterally.   Electronically Signed   By: Camie Patience M.D.   On: 01/03/2014 21:19    Assessment/Plan  80 yo male with recent mechanical fall with left radial fx and back pain unambulatory due to his injuries with mild dehydration  Principal Problem:   Dehydration, mild- gentle ivf overnight.  Hold his hctz.  Active Problems:  Stable unless o/w noted   Parkinson's disease   Mild dementia   Gait difficulty   Hyponatremia   Ileus-  Not sure if this is clinically significant.  Dr Romona Curls to see in am.  Think he needs short term rehab.  Will also repeat L spine xrays.  pcp fagan.  DAVID,RACHAL A 01/03/2014, 10:03 PM

## 2014-01-04 ENCOUNTER — Observation Stay (HOSPITAL_COMMUNITY): Payer: Medicare Other

## 2014-01-04 DIAGNOSIS — S52123A Displaced fracture of head of unspecified radius, initial encounter for closed fracture: Secondary | ICD-10-CM | POA: Diagnosis not present

## 2014-01-04 DIAGNOSIS — E871 Hypo-osmolality and hyponatremia: Secondary | ICD-10-CM | POA: Diagnosis not present

## 2014-01-04 DIAGNOSIS — R7989 Other specified abnormal findings of blood chemistry: Secondary | ICD-10-CM

## 2014-01-04 DIAGNOSIS — E876 Hypokalemia: Secondary | ICD-10-CM | POA: Diagnosis not present

## 2014-01-04 DIAGNOSIS — R269 Unspecified abnormalities of gait and mobility: Secondary | ICD-10-CM | POA: Diagnosis not present

## 2014-01-04 DIAGNOSIS — W19XXXA Unspecified fall, initial encounter: Secondary | ICD-10-CM

## 2014-01-04 DIAGNOSIS — S069X9A Unspecified intracranial injury with loss of consciousness of unspecified duration, initial encounter: Secondary | ICD-10-CM | POA: Diagnosis not present

## 2014-01-04 LAB — CBC
HCT: 34.8 % — ABNORMAL LOW (ref 39.0–52.0)
Hemoglobin: 12.2 g/dL — ABNORMAL LOW (ref 13.0–17.0)
MCH: 36.2 pg — ABNORMAL HIGH (ref 26.0–34.0)
MCHC: 35.1 g/dL (ref 30.0–36.0)
MCV: 103.3 fL — ABNORMAL HIGH (ref 78.0–100.0)
PLATELETS: 260 10*3/uL (ref 150–400)
RBC: 3.37 MIL/uL — ABNORMAL LOW (ref 4.22–5.81)
RDW: 12.3 % (ref 11.5–15.5)
WBC: 10.5 10*3/uL (ref 4.0–10.5)

## 2014-01-04 LAB — BASIC METABOLIC PANEL
BUN: 6 mg/dL (ref 6–23)
CALCIUM: 9.1 mg/dL (ref 8.4–10.5)
CO2: 29 mEq/L (ref 19–32)
Chloride: 89 mEq/L — ABNORMAL LOW (ref 96–112)
Creatinine, Ser: 0.37 mg/dL — ABNORMAL LOW (ref 0.50–1.35)
GFR calc non Af Amer: >90 mL/min (ref >90)
Glucose, Bld: 95 mg/dL (ref 70–99)
POTASSIUM: 3.8 meq/L (ref 3.7–5.3)
Sodium: 131 mEq/L — ABNORMAL LOW (ref 137–147)

## 2014-01-04 LAB — GLUCOSE, CAPILLARY
GLUCOSE-CAPILLARY: 110 mg/dL — AB (ref 70–99)
Glucose-Capillary: 129 mg/dL — ABNORMAL HIGH (ref 70–99)
Glucose-Capillary: 131 mg/dL — ABNORMAL HIGH (ref 70–99)
Glucose-Capillary: 139 mg/dL — ABNORMAL HIGH (ref 70–99)
Glucose-Capillary: 62 mg/dL — ABNORMAL LOW (ref 70–99)
Glucose-Capillary: 83 mg/dL (ref 70–99)
Glucose-Capillary: 99 mg/dL (ref 70–99)

## 2014-01-04 MED ORDER — INSULIN ASPART 100 UNIT/ML ~~LOC~~ SOLN
0.0000 [IU] | SUBCUTANEOUS | Status: DC
  Administered 2014-01-04 (×3): 1 [IU] via SUBCUTANEOUS

## 2014-01-04 MED ORDER — DOCUSATE SODIUM 100 MG PO CAPS
100.0000 mg | ORAL_CAPSULE | Freq: Two times a day (BID) | ORAL | Status: DC
  Administered 2014-01-04 (×2): 100 mg via ORAL
  Filled 2014-01-04 (×2): qty 1

## 2014-01-04 MED ORDER — SODIUM CHLORIDE 0.9 % IV SOLN
INTRAVENOUS | Status: DC
  Administered 2014-01-04: 13:00:00 via INTRAVENOUS

## 2014-01-04 MED ORDER — ACETAMINOPHEN 325 MG PO TABS
650.0000 mg | ORAL_TABLET | Freq: Four times a day (QID) | ORAL | Status: DC | PRN
  Administered 2014-01-06 – 2014-01-07 (×3): 650 mg via ORAL
  Filled 2014-01-04 (×3): qty 2

## 2014-01-04 NOTE — Progress Notes (Signed)
Dressing changed completed by MD.  Pt tolerated well.  Family educated on dressing change.  Extra supplies in room.  Orders placed as to what MD expects and changes twice a day and prn.

## 2014-01-04 NOTE — Progress Notes (Signed)
Surgery  Filed Vitals:   01/04/14 0500  BP: 158/66  Pulse: 81  Temp: 97.6 F (36.4 C)  Resp: 18    Wound redressed and it is very clean.  Dressing care explained to family and nursing staff and I will follow as OP when discharged. Would not give pt Lovenox.  Ortho consult pending.

## 2014-01-04 NOTE — Consult Note (Signed)
Reason for Consult: failure to thrive , left elbow fracture , eletrolyte abnormality  Referring Physician: Dr [name] is an 80 y.o. male.  HPI: [place] at home , came to ER, released. At home failed to thrive, came back found to have hypokalemia, gait disturbance.  C/O I can't walk.   Stephen Bates was admitted last night by the hospitalist. He had fallen last Thursday and sustained a wound to his head which had been evaluated at home yesterday by Dr. Romona Curls. He had suffered a left radius fracture. He has not had orthopedic assessment yet. He was found to have pneumonia, hyponatremia, hypokalemia, dehydration and progressive debilitation to the point that his wife really was not able to care for him at home in his current condition. He has a cough with minimal sputum production but denies fever. He has been eating and drinking very little.   Past Medical History  Diagnosis Date  . Hypertension   . Prostate cancer   . Weakness of both legs   . Stroke 1982  . Wears glasses   . Arthritis   . Benign neoplasm of colon   . History of right bundle branch block   . Diabetes   . Mild dementia   . Neuropathy   . Frequent falls 2012  . Chronic back pain   . Lumbar radiculopathy     Past Surgical History  Procedure Laterality Date  . Eye surgery  2010    both cataracts  . Colonoscopy    . Insertion prostate radiation seed  2001  . Mohs surgery  12/12/2008    Family History  Problem Relation Age of Onset  . Colon cancer Sister   . Bladder Cancer Father   . Bronchopulmonary dysplasia Mother     Social History:  reports that he quit smoking about 44 years ago. He has never used smokeless tobacco. He reports that he drinks alcohol. He reports that he does not use illicit drugs.  Allergies: No Known Allergies  Medications: I have reviewed the patient's current medications.  Results for orders placed during the hospital encounter of 01/03/14 (from the past 48 hour(s))   CBC WITH DIFFERENTIAL     Status: Abnormal   Collection Time    01/03/14  8:15 PM      Result Value Ref Range   WBC 12.8 (*) 4.0 - 10.5 K/uL   RBC 3.42 (*) 4.22 - 5.81 MIL/uL   Hemoglobin 12.6 (*) 13.0 - 17.0 g/dL   HCT 35.3 (*) 39.0 - 52.0 %   MCV 103.2 (*) 78.0 - 100.0 fL   MCH 36.8 (*) 26.0 - 34.0 pg   MCHC 35.7  30.0 - 36.0 g/dL   RDW 12.3  11.5 - 15.5 %   Platelets 238  150 - 400 K/uL   Neutrophils Relative % 69  43 - 77 %   Neutro Abs 8.8 (*) 1.7 - 7.7 K/uL   Lymphocytes Relative 17  12 - 46 %   Lymphs Abs 2.2  0.7 - 4.0 K/uL   Monocytes Relative 13 (*) 3 - 12 %   Monocytes Absolute 1.7 (*) 0.1 - 1.0 K/uL   Eosinophils Relative 1  0 - 5 %   Eosinophils Absolute 0.1  0.0 - 0.7 K/uL   Basophils Relative 0  0 - 1 %   Basophils Absolute 0.0  0.0 - 0.1 K/uL  LACTIC ACID, PLASMA     Status: Abnormal   Collection Time  01/03/14  8:15 PM      Result Value Ref Range   Lactic Acid, Venous 3.1 (*) 0.5 - 2.2 mmol/L  TROPONIN I     Status: None   Collection Time    01/03/14  8:15 PM      Result Value Ref Range   Troponin I <0.30  <0.30 ng/mL   Comment:            Due to the release kinetics of cTnI,     a negative result within the first hours     of the onset of symptoms does not rule out     myocardial infarction with certainty.     If myocardial infarction is still suspected,     repeat the test at appropriate intervals.  COMPREHENSIVE METABOLIC PANEL     Status: Abnormal   Collection Time    01/03/14  8:15 PM      Result Value Ref Range   Sodium 124 (*) 137 - 147 mEq/L   Potassium 3.1 (*) 3.7 - 5.3 mEq/L   Chloride 80 (*) 96 - 112 mEq/L   CO2 26  19 - 32 mEq/L   Glucose, Bld 129 (*) 70 - 99 mg/dL   BUN 7  6 - 23 mg/dL   Creatinine, Ser 0.42 (*) 0.50 - 1.35 mg/dL   Calcium 9.4  8.4 - 10.5 mg/dL   Total Protein 7.4  6.0 - 8.3 g/dL   Albumin 3.5  3.5 - 5.2 g/dL   AST 22  0 - 37 U/L   ALT <5  0 - 53 U/L   Alkaline Phosphatase 61  39 - 117 U/L   Total Bilirubin  1.2  0.3 - 1.2 mg/dL   GFR calc non Af Amer >90  >90 mL/min   GFR calc Af Amer >90  >90 mL/min   Comment: (NOTE)     The eGFR has been calculated using the CKD EPI equation.     This calculation has not been validated in all clinical situations.     eGFR's persistently <90 mL/min signify possible Chronic Kidney     Disease.  LIPASE, BLOOD     Status: None   Collection Time    01/03/14  8:15 PM      Result Value Ref Range   Lipase 38  11 - 59 U/L  URINALYSIS, ROUTINE W REFLEX MICROSCOPIC     Status: Abnormal   Collection Time    01/03/14  8:49 PM      Result Value Ref Range   Color, Urine YELLOW  YELLOW   APPearance CLEAR  CLEAR   Specific Gravity, Urine 1.020  1.005 - 1.030   pH 6.5  5.0 - 8.0   Glucose, UA NEGATIVE  NEGATIVE mg/dL   Hgb urine dipstick LARGE (*) NEGATIVE   Bilirubin Urine SMALL (*) NEGATIVE   Ketones, ur >80 (*) NEGATIVE mg/dL   Protein, ur TRACE (*) NEGATIVE mg/dL   Urobilinogen, UA 0.2  0.0 - 1.0 mg/dL   Nitrite NEGATIVE  NEGATIVE   Leukocytes, UA NEGATIVE  NEGATIVE  URINE MICROSCOPIC-ADD ON     Status: Abnormal   Collection Time    01/03/14  8:49 PM      Result Value Ref Range   Squamous Epithelial / LPF RARE  RARE   WBC, UA 3-6  <3 WBC/hpf   RBC / HPF TOO NUMEROUS TO COUNT  <3 RBC/hpf   Bacteria, UA FEW (*) RARE  GLUCOSE, CAPILLARY  Status: Abnormal   Collection Time    01/04/14  2:28 AM      Result Value Ref Range   Glucose-Capillary 131 (*) 70 - 99 mg/dL  GLUCOSE, CAPILLARY     Status: Abnormal   Collection Time    01/04/14  4:05 AM      Result Value Ref Range   Glucose-Capillary 110 (*) 70 - 99 mg/dL  BASIC METABOLIC PANEL     Status: Abnormal   Collection Time    01/04/14  6:02 AM      Result Value Ref Range   Sodium 131 (*) 137 - 147 mEq/L   Comment: DELTA CHECK NOTED   Potassium 3.8  3.7 - 5.3 mEq/L   Chloride 89 (*) 96 - 112 mEq/L   CO2 29  19 - 32 mEq/L   Glucose, Bld 95  70 - 99 mg/dL   BUN 6  6 - 23 mg/dL   Creatinine, Ser  0.37 (*) 0.50 - 1.35 mg/dL   Calcium 9.1  8.4 - 10.5 mg/dL   GFR calc non Af Amer >90  >90 mL/min   GFR calc Af Amer >90  >90 mL/min   Comment: (NOTE)     The eGFR has been calculated using the CKD EPI equation.     This calculation has not been validated in all clinical situations.     eGFR's persistently <90 mL/min signify possible Chronic Kidney     Disease.  CBC     Status: Abnormal   Collection Time    01/04/14  6:02 AM      Result Value Ref Range   WBC 10.5  4.0 - 10.5 K/uL   RBC 3.37 (*) 4.22 - 5.81 MIL/uL   Hemoglobin 12.2 (*) 13.0 - 17.0 g/dL   HCT 34.8 (*) 39.0 - 52.0 %   MCV 103.3 (*) 78.0 - 100.0 fL   MCH 36.2 (*) 26.0 - 34.0 pg   MCHC 35.1  30.0 - 36.0 g/dL   RDW 12.3  11.5 - 15.5 %   Platelets 260  150 - 400 K/uL  GLUCOSE, CAPILLARY     Status: None   Collection Time    01/04/14  7:10 AM      Result Value Ref Range   Glucose-Capillary 99  70 - 99 mg/dL   Comment 1 Notify RN      Dg Abd Acute W/chest  01/03/2014   CLINICAL DATA:  Back pain, weakness and abdominal distention after falling 5 days ago.  EXAM: ACUTE ABDOMEN SERIES (ABDOMEN 2 VIEW & CHEST 1 VIEW)  COMPARISON:  Lumbar spine radiographs 12/29/2013. Two view chest 12/10/2012.  FINDINGS: There are lower lung volumes with new left lower lobe patchy airspace disease. There may be a small left pleural effusion. No pneumothorax is demonstrated. The heart size and mediastinal contours are normal. Lower rib fractures are noted bilaterally, age indeterminate.  Abdominal radiographs demonstrate mild diffuse small bowel distension. There is gas throughout the colon which is not dilated. There is no free intraperitoneal air. Prostate brachytherapy seeds are noted. Mild degenerative changes are present within the lumbar spine.  IMPRESSION: 1. Mild small bowel distention, favored to reflect an ileus. No evidence of high-grade mechanical obstruction. 2. New left basilar airspace disease and possible left pleural effusion. Age  indeterminate rib fractures bilaterally.   Electronically Signed   By: Camie Patience M.D.   On: 01/03/2014 21:19    Review of Systems  Unable to perform ROS: mental acuity  Blood pressure 158/66, pulse 81, temperature 97.6 F (36.4 C), temperature source Oral, resp. rate 18, height 5' 4"  (1.626 m), weight 127 lb 6.8 oz (57.8 kg), SpO2 98.00%. Physical Exam  General frame small; nutrition ? Able. Mood Affect are normal The patient is alert and oriented x3 Ambulatory status difficult by report (wife says dizzy frequently and needs walker at home )  RUE (include skin) Inspection reveals skin ecchymoses through out . Joint range of motion is within normal limits without contracture. There are no subluxations. Muscle tone is normal. Skin is warm dry  LUE Inspection reveals ecchymosis is noted throughout the skin of the upper extremity. There are no joint contractures but he has tenderness over the lateral aspect of the radial head. Elbow flexion 120 with elbow extension 10  No subluxation muscle tone normal skin warm dry with ecchymosis as described.  Right and left hip no tenderness or pain with range of motion active range of motion is normal he has flexion contractures of both knees slight external rotation of both hips chronic. No subluxation muscle tone normal skin warm dry and intact no rashes or ulcerations ecchymosis is seen.   CDV peripheral pulses are intact   LYMPH are normal in all 4 extremities with no palpable nodes  DTR and Balance  could not assess    Assessment/Plan: Left radial head fracture. I removed the splint (6 days post injury).  Hypokalemia  Abnormal sodium Abnormal gait Dehydration   PT gait assist and assessment.   Arther Abbott 01/04/2014, 10:42 AM

## 2014-01-04 NOTE — Progress Notes (Signed)
Pt refused further X-rays this morning as patient states they were done last night.  Pt not willing to transfer from bed to X-ray table.  Pt has no complaints other than it is painful to get out of bed.  Pt states his appetite has been poor but bowels have been moving normally.  Pt denies cough. Pt states he is just very weak and it hurts to move out of the bed.

## 2014-01-04 NOTE — Progress Notes (Signed)
UR completed 

## 2014-01-04 NOTE — Evaluation (Addendum)
Physical Therapy Evaluation Patient Details Name: Stephen Bates MRN: [id number] DOB: 1927/01/03 Today's Date: 01/04/2014   History of Present Illness  80 yo male fell 5 days injured his left arm and back was checked on by dr bradford today at his house, as pt could not get out of the house.  Since his fall, he has been unable to get up out of his chair much.  Before the fall, he barely ambulated with walker, now he cannot even do that.  Dr Romona Curls had him come to the ED to be admitted.  Pt has been not eating or drinking well.  He is dehydrated.  He lives with his elderly wife.  No fevers.  No swelling in his legs.  No abd pain.  Only pain is in his lower back.  Able to move his legs, but overall weak.  No n/v/d.  No cough.   Xray Lt elbow reveals Mildly impacted fracture of the lateral third of the radial head  Clinical Impression  Pt is an 80 year old male who had a fall at home resulting in an impacted fracture of the lateral third of the radial head; pt currently in a soft cast.  Pt has a history of 2 falls in the past year, both resulting in injury.  Pt has a history of Parkinsons Disease, and reports he was started on medication for this for the past 6 months.  Pt lives with his wife in a single story home with 1 steps to enter.  Prior to his fall, the pt was mod (I) with bed mobility skills (he has been sleeping in a recliner), transfers and household amb with use of std walker.  Since the fall, the wife reports he has been requiring assistance with all functional mobility skills, and has been unable to amb for the pas week.  During evaluation, the pt required mod/max assist for bed mobility skills with HOB slightly raised and mod/max assist to transfer into standing.  Attempted amb however pt had difficulty clearing foot for forward progression, instead shifting side to side.  Noted deficits in strength of (B) LE with MMT testing.  Recommend continued PT while in the hospital to address  strengthening, balance, pain management, and activity tolerance for improvement of functional mobility skills with transition SNF at discharge.  No DME recommendations.     Follow Up Recommendations SNF    Equipment Recommendations  None recommended by PT    Recommendations for Other Services OT consult;Speech consult     Precautions / Restrictions Precautions Precautions: Fall Precaution Comments: Parkinsons Disease Required Braces or Orthoses: Other Brace/Splint Other Brace/Splint: Soft cast on Lt UE Restrictions Weight Bearing Restrictions: Yes Other Position/Activity Restrictions: Soft cast on Lt UE, with VC to avoid WB on this side.       Mobility  Bed Mobility Overal bed mobility: Needs Assistance Bed Mobility: Supine to Sit;Sit to Supine     Supine to sit: Mod assist;Max assist;HOB elevated (Mod/Max assist for trunk, with increased time to move LE off EOB) Sit to supine: Mod assist;HOB elevated;Min guard (Min guard for trunk, mod assist for LE)      Transfers Overall transfer level: Needs assistance Equipment used: Rolling walker (2 wheeled) Transfers: Sit to/from Stand Sit to Stand: Mod assist;Max assist            Ambulation/Gait Ambulation/Gait assistance: Max assist Ambulation Distance (Feet): 1 Feet Assistive device: Rolling walker (2 wheeled) Gait Pattern/deviations: Decreased step length - left;Decreased step length -  right     General Gait Details: Attempted amb, however pt had difficulty clearing feet and w/s to progress feet forward.  During attempted amb, pt shift side to side vs. forward/backward.      Balance Overall balance assessment: Needs assistance Sitting-balance support: Single extremity supported;Feet supported Sitting balance-Leahy Scale: Fair   Postural control: Posterior lean Standing balance support: Bilateral upper extremity supported;During functional activity (On RW, with mod/max assist to maintain) Standing  balance-Leahy Scale: Poor                               Pertinent Vitals/Pain PAINAD 1 (in low back during transfer)    Home Living Family/patient expects to be discharged to:: Unsure Living Arrangements: Spouse/significant other Available Help at Discharge: Family;Available 24 hours/day Type of Home: House Home Access: Stairs to enter Entrance Stairs-Rails: Right Entrance Stairs-Number of Steps: 1 Home Layout: One level Home Equipment: Walker - 2 wheels;Walker - standard;Bedside commode;Grab bars - tub/shower;Grab bars - toilet;Toilet riser;Shower seat      Prior Function Level of Independence: Needs assistance   Gait / Transfers Assistance Needed: Pt was previously mod (I) with bed mobility skills from recliner, transfers and amb with use of std walker.  Wife reports in the past week he has required assist with bed mobility and transfers, and has not amb for ~1 week.            Hand Dominance   Dominant Hand: Right    Extremity/Trunk Assessment               Lower Extremity Assessment: Generalized weakness;RLE deficits/detail;LLE deficits/detail RLE Deficits / Details: MMT hip 3+/5, knee/ankle 4-/5 LLE Deficits / Details: MMT hip 3+/5, knee/ankle 4-/5     Communication   Communication: No difficulties  Cognition Arousal/Alertness: Awake/alert Behavior During Therapy: WFL for tasks assessed/performed Overall Cognitive Status: Within Functional Limits for tasks assessed                               Assessment/Plan    PT Assessment Patient needs continued PT services  PT Diagnosis Difficulty walking;Generalized weakness   PT Problem List Decreased strength;Decreased activity tolerance;Decreased balance;Decreased mobility;Decreased safety awareness  PT Treatment Interventions Balance training;Gait training;Neuromuscular re-education;Functional mobility training;Therapeutic activities;Therapeutic exercise   PT Goals (Current goals can  be found in the Care Plan section) Acute Rehab PT Goals Patient Stated Goal: to go home PT Goal Formulation: With patient/family Time For Goal Achievement: 01/18/14 Potential to Achieve Goals: Good    Frequency Min 3X/week   Barriers to discharge Decreased caregiver support Family is available 24/7, though unknown if family would be able to provide level of care (mod/max assist for transfers right now) as pt lives with elderly wife       End of Session Equipment Utilized During Treatment: Gait belt Activity Tolerance: Patient limited by fatigue;Patient limited by pain (c/o pain in low back) Patient left: in bed;with call bell/phone within reach;with bed alarm set;Other (comment) (RN present in room to give meds)      Functional Assessment Tool Used: Clinical Judgement Functional Limitation: Mobility: Walking and moving around Mobility: Walking and Moving Around Current Status [phone]): At least 60 percent but less than 80 percent impaired, limited or restricted Mobility: Walking and Moving Around Goal Status [phone]): At least 1 percent but less than 20 percent impaired, limited or restricted    Time:  0822-0849 PT Time Calculation (min): 27 min   Charges:   PT Evaluation $Initial PT Evaluation Tier I: 1 Procedure     PT G Codes:   Functional Assessment Tool Used: Clinical Judgement Functional Limitation: Mobility: Walking and moving around    [facility] 01/04/2014, 9:11 AM  ADDENDUM : CORRECTED DISCHARGE RECOMMENDATION

## 2014-01-04 NOTE — Progress Notes (Signed)
Subjective: Stephen Bates was admitted last night by the hospitalist. He had fallen last Thursday and sustained a wound to his head which had been evaluated at home yesterday by Dr. Romona Curls. He had suffered a left radius fracture. He has not had orthopedic assessment yet. He was found to have pneumonia, hyponatremia, hypokalemia, dehydration and progressive debilitation to the point that his wife really was not able to care for him at home in his current condition. He has a cough with minimal sputum production but denies fever. He has been eating and drinking very little.  Objective: Vital signs in last 24 hours: Filed Vitals:   01/03/14 1943 01/03/14 2224 01/03/14 2328 01/04/14 0500  BP: 139/57 144/66 146/57 158/66  Pulse: 78 80 81 81  Temp: 97.4 F (36.3 C)  98.4 F (36.9 C) 97.6 F (36.4 C)  TempSrc: Oral  Oral Oral  Resp: 18 18 18 18   Height:   5\' 4"  (1.626 m)   Weight:   127 lb 6.8 oz (57.8 kg)   SpO2: 95% 98% 100% 98%   Weight change:   Intake/Output Summary (Last 24 hours) at 01/04/14 1254 Last data filed at 01/04/14 0700  Gross per 24 hour  Intake      0 ml  Output    280 ml  Net   -280 ml    Physical Exam: Alert and oriented. Mucous membranes are moist now after hydration overnight. He is eating his lunch without difficulty. Lungs clear. Heart regular with no murmurs. Abdomen is soft and nontender with no palpable organomegaly. His left forearm is splinted. His lower extremities reveal no edema. Dressings are in place on his scalp. CT revealed no skull fracture or intracranial injury.  Lab Results:    Results for orders placed during the hospital encounter of 01/03/14 (from the past 24 hour(s))  CBC WITH DIFFERENTIAL     Status: Abnormal   Collection Time    01/03/14  8:15 PM      Result Value Ref Range   WBC 12.8 (*) 4.0 - 10.5 K/uL   RBC 3.42 (*) 4.22 - 5.81 MIL/uL   Hemoglobin 12.6 (*) 13.0 - 17.0 g/dL   HCT 35.3 (*) 39.0 - 52.0 %   MCV 103.2 (*) 78.0 - 100.0  fL   MCH 36.8 (*) 26.0 - 34.0 pg   MCHC 35.7  30.0 - 36.0 g/dL   RDW 12.3  11.5 - 15.5 %   Platelets 238  150 - 400 K/uL   Neutrophils Relative % 69  43 - 77 %   Neutro Abs 8.8 (*) 1.7 - 7.7 K/uL   Lymphocytes Relative 17  12 - 46 %   Lymphs Abs 2.2  0.7 - 4.0 K/uL   Monocytes Relative 13 (*) 3 - 12 %   Monocytes Absolute 1.7 (*) 0.1 - 1.0 K/uL   Eosinophils Relative 1  0 - 5 %   Eosinophils Absolute 0.1  0.0 - 0.7 K/uL   Basophils Relative 0  0 - 1 %   Basophils Absolute 0.0  0.0 - 0.1 K/uL  LACTIC ACID, PLASMA     Status: Abnormal   Collection Time    01/03/14  8:15 PM      Result Value Ref Range   Lactic Acid, Venous 3.1 (*) 0.5 - 2.2 mmol/L  TROPONIN I     Status: None   Collection Time    01/03/14  8:15 PM      Result Value Ref Range  Troponin I <0.30  <0.30 ng/mL  COMPREHENSIVE METABOLIC PANEL     Status: Abnormal   Collection Time    01/03/14  8:15 PM      Result Value Ref Range   Sodium 124 (*) 137 - 147 mEq/L   Potassium 3.1 (*) 3.7 - 5.3 mEq/L   Chloride 80 (*) 96 - 112 mEq/L   CO2 26  19 - 32 mEq/L   Glucose, Bld 129 (*) 70 - 99 mg/dL   BUN 7  6 - 23 mg/dL   Creatinine, Ser 0.42 (*) 0.50 - 1.35 mg/dL   Calcium 9.4  8.4 - 10.5 mg/dL   Total Protein 7.4  6.0 - 8.3 g/dL   Albumin 3.5  3.5 - 5.2 g/dL   AST 22  0 - 37 U/L   ALT <5  0 - 53 U/L   Alkaline Phosphatase 61  39 - 117 U/L   Total Bilirubin 1.2  0.3 - 1.2 mg/dL   GFR calc non Af Amer >90  >90 mL/min   GFR calc Af Amer >90  >90 mL/min  LIPASE, BLOOD     Status: None   Collection Time    01/03/14  8:15 PM      Result Value Ref Range   Lipase 38  11 - 59 U/L  URINALYSIS, ROUTINE W REFLEX MICROSCOPIC     Status: Abnormal   Collection Time    01/03/14  8:49 PM      Result Value Ref Range   Color, Urine YELLOW  YELLOW   APPearance CLEAR  CLEAR   Specific Gravity, Urine 1.020  1.005 - 1.030   pH 6.5  5.0 - 8.0   Glucose, UA NEGATIVE  NEGATIVE mg/dL   Hgb urine dipstick LARGE (*) NEGATIVE    Bilirubin Urine SMALL (*) NEGATIVE   Ketones, ur >80 (*) NEGATIVE mg/dL   Protein, ur TRACE (*) NEGATIVE mg/dL   Urobilinogen, UA 0.2  0.0 - 1.0 mg/dL   Nitrite NEGATIVE  NEGATIVE   Leukocytes, UA NEGATIVE  NEGATIVE  URINE MICROSCOPIC-ADD ON     Status: Abnormal   Collection Time    01/03/14  8:49 PM      Result Value Ref Range   Squamous Epithelial / LPF RARE  RARE   WBC, UA 3-6  <3 WBC/hpf   RBC / HPF TOO NUMEROUS TO COUNT  <3 RBC/hpf   Bacteria, UA FEW (*) RARE  GLUCOSE, CAPILLARY     Status: Abnormal   Collection Time    01/04/14  2:28 AM      Result Value Ref Range   Glucose-Capillary 131 (*) 70 - 99 mg/dL  GLUCOSE, CAPILLARY     Status: Abnormal   Collection Time    01/04/14  4:05 AM      Result Value Ref Range   Glucose-Capillary 110 (*) 70 - 99 mg/dL  BASIC METABOLIC PANEL     Status: Abnormal   Collection Time    01/04/14  6:02 AM      Result Value Ref Range   Sodium 131 (*) 137 - 147 mEq/L   Potassium 3.8  3.7 - 5.3 mEq/L   Chloride 89 (*) 96 - 112 mEq/L   CO2 29  19 - 32 mEq/L   Glucose, Bld 95  70 - 99 mg/dL   BUN 6  6 - 23 mg/dL   Creatinine, Ser 0.37 (*) 0.50 - 1.35 mg/dL   Calcium 9.1  8.4 - 10.5 mg/dL  GFR calc non Af Amer >90  >90 mL/min   GFR calc Af Amer >90  >90 mL/min  CBC     Status: Abnormal   Collection Time    01/04/14  6:02 AM      Result Value Ref Range   WBC 10.5  4.0 - 10.5 K/uL   RBC 3.37 (*) 4.22 - 5.81 MIL/uL   Hemoglobin 12.2 (*) 13.0 - 17.0 g/dL   HCT 34.8 (*) 39.0 - 52.0 %   MCV 103.3 (*) 78.0 - 100.0 fL   MCH 36.2 (*) 26.0 - 34.0 pg   MCHC 35.1  30.0 - 36.0 g/dL   RDW 12.3  11.5 - 15.5 %   Platelets 260  150 - 400 K/uL  GLUCOSE, CAPILLARY     Status: None   Collection Time    01/04/14  7:10 AM      Result Value Ref Range   Glucose-Capillary 99  70 - 99 mg/dL   Comment 1 Notify RN    GLUCOSE, CAPILLARY     Status: Abnormal   Collection Time    01/04/14 11:12 AM      Result Value Ref Range   Glucose-Capillary 129 (*) 70 -  99 mg/dL   Comment 1 Notify RN       ABGS No results found for this basename: PHART, PCO2, PO2ART, TCO2, HCO3,  in the last 72 hours CULTURES No results found for this or any previous visit (from the past 240 hour(s)). Studies/Results: Dg Abd Acute W/chest  01/03/2014   CLINICAL DATA:  Back pain, weakness and abdominal distention after falling 5 days ago.  EXAM: ACUTE ABDOMEN SERIES (ABDOMEN 2 VIEW & CHEST 1 VIEW)  COMPARISON:  Lumbar spine radiographs 12/29/2013. Two view chest 12/10/2012.  FINDINGS: There are lower lung volumes with new left lower lobe patchy airspace disease. There may be a small left pleural effusion. No pneumothorax is demonstrated. The heart size and mediastinal contours are normal. Lower rib fractures are noted bilaterally, age indeterminate.  Abdominal radiographs demonstrate mild diffuse small bowel distension. There is gas throughout the colon which is not dilated. There is no free intraperitoneal air. Prostate brachytherapy seeds are noted. Mild degenerative changes are present within the lumbar spine.  IMPRESSION: 1. Mild small bowel distention, favored to reflect an ileus. No evidence of high-grade mechanical obstruction. 2. New left basilar airspace disease and possible left pleural effusion. Age indeterminate rib fractures bilaterally.   Electronically Signed   By: Camie Patience M.D.   On: 01/03/2014 21:19   Micro Results: No results found for this or any previous visit (from the past 240 hour(s)). Studies/Results: Dg Abd Acute W/chest  01/03/2014   CLINICAL DATA:  Back pain, weakness and abdominal distention after falling 5 days ago.  EXAM: ACUTE ABDOMEN SERIES (ABDOMEN 2 VIEW & CHEST 1 VIEW)  COMPARISON:  Lumbar spine radiographs 12/29/2013. Two view chest 12/10/2012.  FINDINGS: There are lower lung volumes with new left lower lobe patchy airspace disease. There may be a small left pleural effusion. No pneumothorax is demonstrated. The heart size and mediastinal  contours are normal. Lower rib fractures are noted bilaterally, age indeterminate.  Abdominal radiographs demonstrate mild diffuse small bowel distension. There is gas throughout the colon which is not dilated. There is no free intraperitoneal air. Prostate brachytherapy seeds are noted. Mild degenerative changes are present within the lumbar spine.  IMPRESSION: 1. Mild small bowel distention, favored to reflect an ileus. No evidence of high-grade mechanical obstruction.  2. New left basilar airspace disease and possible left pleural effusion. Age indeterminate rib fractures bilaterally.   Electronically Signed   By: Camie Patience M.D.   On: 01/03/2014 21:19   Medications:  I have reviewed the patient's current medications Scheduled Meds: . aspirin EC  81 mg Oral Daily  . [START ON 01/05/2014] azithromycin  250 mg Oral Daily  . carbidopa-levodopa  2 tablet Oral TID  . cefTRIAXone (ROCEPHIN)  IV  1 g Intravenous Q24H  . docusate sodium  100 mg Oral BID  . insulin aspart  0-9 Units Subcutaneous 6 times per day   Continuous Infusions: . sodium chloride     PRN Meds:.acetaminophen   Assessment/Plan: #1. Hyponatremia. Improved to 131 from 124 with hydration overnight. #2. Hypokalemia. Normalized to 3.8 with IV potassium. #3. Left lower lobe pneumonia with effusion. Continue ceftriaxone and azithromycin. His white count has normalized to 10.5. Lactic acid level was elevated at 3.1 initially. #4. Superficial scalp lacerations. Continue surgical care. #5. Left radius fracture. Orthopedic consult pending. #5. Parkinson's disease. Continue Sinemet. Continue physical therapy. It appears he will likely need rehabilitation in skilled nursing prior to returning home. #7. Diabetes. Continue holding metformin. #8. Ileus by abdominal films. Asymptomatic. He is eating without difficulty now observed. #9. Abnormal urinalysis. Hematuria. Culture pending. #10. Dehydration. Improving. Continue IV saline. Principal  Problem:   Dehydration, mild Active Problems:   Parkinson's disease   Mild dementia   Gait difficulty   Hyponatremia   Ileus     LOS: 1 day   Stephen Bates 01/04/2014, 12:54 PM

## 2014-01-05 DIAGNOSIS — S069X9A Unspecified intracranial injury with loss of consciousness of unspecified duration, initial encounter: Secondary | ICD-10-CM | POA: Diagnosis not present

## 2014-01-05 DIAGNOSIS — E871 Hypo-osmolality and hyponatremia: Secondary | ICD-10-CM | POA: Diagnosis not present

## 2014-01-05 DIAGNOSIS — J189 Pneumonia, unspecified organism: Secondary | ICD-10-CM | POA: Diagnosis present

## 2014-01-05 LAB — BASIC METABOLIC PANEL
Anion gap: 11 (ref 5–15)
BUN: 6 mg/dL (ref 6–23)
CO2: 27 mEq/L (ref 19–32)
CREATININE: 0.36 mg/dL — AB (ref 0.50–1.35)
Calcium: 8.9 mg/dL (ref 8.4–10.5)
Chloride: 94 mEq/L — ABNORMAL LOW (ref 96–112)
GFR calc Af Amer: >90 mL/min (ref >90)
GLUCOSE: 141 mg/dL — AB (ref 70–99)
POTASSIUM: 3.4 meq/L — AB (ref 3.7–5.3)
Sodium: 132 mEq/L — ABNORMAL LOW (ref 137–147)

## 2014-01-05 LAB — GLUCOSE, CAPILLARY
GLUCOSE-CAPILLARY: 122 mg/dL — AB (ref 70–99)
GLUCOSE-CAPILLARY: 133 mg/dL — AB (ref 70–99)
GLUCOSE-CAPILLARY: 109 mg/dL — AB (ref 70–99)
Glucose-Capillary: 103 mg/dL — ABNORMAL HIGH (ref 70–99)
Glucose-Capillary: 107 mg/dL — ABNORMAL HIGH (ref 70–99)
Glucose-Capillary: 111 mg/dL — ABNORMAL HIGH (ref 70–99)
Glucose-Capillary: 110 mg/dL — ABNORMAL HIGH (ref 70–99)

## 2014-01-05 LAB — URINE CULTURE
Colony Count: NO GROWTH
Culture: NO GROWTH

## 2014-01-05 MED ORDER — POTASSIUM CHLORIDE CRYS ER 20 MEQ PO TBCR
20.0000 meq | EXTENDED_RELEASE_TABLET | Freq: Three times a day (TID) | ORAL | Status: AC
  Administered 2014-01-05 – 2014-01-06 (×6): 20 meq via ORAL
  Filled 2014-01-05 (×6): qty 1

## 2014-01-05 MED ORDER — SPIRITUS FRUMENTI
1.0000 | Freq: Every day | ORAL | Status: DC
  Administered 2014-01-05: 1 via ORAL

## 2014-01-05 NOTE — Progress Notes (Signed)
Subjective: Stephen Bates rested well last night he is not having significant pain. His cough is improved. His oxygen saturations are normal. He has had no fever. He is eating better.  Objective: Vital signs in last 24 hours: Filed Vitals:   01/04/14 0500 01/04/14 1656 01/04/14 2100 01/05/14 0437  BP: 158/66 133/58 151/71 158/84  Pulse: 81 77 75 70  Temp: 97.6 F (36.4 C)  98.5 F (36.9 C) 98.4 F (36.9 C)  TempSrc: Oral   Oral  Resp: 18 18 18 18   Height:      Weight:      SpO2: 98% 98% 97% 97%   Weight change:   Intake/Output Summary (Last 24 hours) at 01/05/14 0745 Last data filed at 01/04/14 1825  Gross per 24 hour  Intake    480 ml  Output    100 ml  Net    380 ml    Physical Exam: Alert and comfortable appearing. He has a triangular 1-1/2 inch laceration on the back of his head which is showing no signs of infection he has a superficial skin flap-type laceration more anteriorly which appears to be showing no signs of infection. Lungs clear breathing comfortably. Heart regular with no murmurs. Extremities reveal no edema. Neuro at baseline.  Lab Results:    Results for orders placed during the hospital encounter of 01/03/14 (from the past 24 hour(s))  GLUCOSE, CAPILLARY     Status: Abnormal   Collection Time    01/04/14 11:12 AM      Result Value Ref Range   Glucose-Capillary 129 (*) 70 - 99 mg/dL   Comment 1 Notify RN    GLUCOSE, CAPILLARY     Status: Abnormal   Collection Time    01/04/14  4:28 PM      Result Value Ref Range   Glucose-Capillary 139 (*) 70 - 99 mg/dL   Comment 1 Notify RN    GLUCOSE, CAPILLARY     Status: None   Collection Time    01/04/14  9:26 PM      Result Value Ref Range   Glucose-Capillary 83  70 - 99 mg/dL  GLUCOSE, CAPILLARY     Status: Abnormal   Collection Time    01/04/14 11:58 PM      Result Value Ref Range   Glucose-Capillary 62 (*) 70 - 99 mg/dL  GLUCOSE, CAPILLARY     Status: Abnormal   Collection Time    01/05/14  1:59  AM      Result Value Ref Range   Glucose-Capillary 122 (*) 70 - 99 mg/dL  GLUCOSE, CAPILLARY     Status: Abnormal   Collection Time    01/05/14  3:34 AM      Result Value Ref Range   Glucose-Capillary 107 (*) 70 - 99 mg/dL  GLUCOSE, CAPILLARY     Status: Abnormal   Collection Time    01/05/14  4:12 AM      Result Value Ref Range   Glucose-Capillary 103 (*) 70 - 99 mg/dL   Comment 1 Documented in Chart     Comment 2 Notify RN    BASIC METABOLIC PANEL     Status: Abnormal   Collection Time    01/05/14  5:18 AM      Result Value Ref Range   Sodium 132 (*) 137 - 147 mEq/L   Potassium 3.4 (*) 3.7 - 5.3 mEq/L   Chloride 94 (*) 96 - 112 mEq/L   CO2 27  19 -  32 mEq/L   Glucose, Bld 141 (*) 70 - 99 mg/dL   BUN 6  6 - 23 mg/dL   Creatinine, Ser 0.36 (*) 0.50 - 1.35 mg/dL   Calcium 8.9  8.4 - 10.5 mg/dL   GFR calc non Af Amer >90  >90 mL/min   GFR calc Af Amer >90  >90 mL/min   Anion gap 11  5 - 15  GLUCOSE, CAPILLARY     Status: Abnormal   Collection Time    01/05/14  7:31 AM      Result Value Ref Range   Glucose-Capillary 133 (*) 70 - 99 mg/dL   Comment 1 Notify RN       ABGS No results found for this basename: PHART, PCO2, PO2ART, TCO2, HCO3,  in the last 72 hours CULTURES No results found for this or any previous visit (from the past 240 hour(s)). Studies/Results: Dg Abd Acute W/chest  01/03/2014   CLINICAL DATA:  Back pain, weakness and abdominal distention after falling 5 days ago.  EXAM: ACUTE ABDOMEN SERIES (ABDOMEN 2 VIEW & CHEST 1 VIEW)  COMPARISON:  Lumbar spine radiographs 12/29/2013. Two view chest 12/10/2012.  FINDINGS: There are lower lung volumes with new left lower lobe patchy airspace disease. There may be a small left pleural effusion. No pneumothorax is demonstrated. The heart size and mediastinal contours are normal. Lower rib fractures are noted bilaterally, age indeterminate.  Abdominal radiographs demonstrate mild diffuse small bowel distension. There is gas  throughout the colon which is not dilated. There is no free intraperitoneal air. Prostate brachytherapy seeds are noted. Mild degenerative changes are present within the lumbar spine.  IMPRESSION: 1. Mild small bowel distention, favored to reflect an ileus. No evidence of high-grade mechanical obstruction. 2. New left basilar airspace disease and possible left pleural effusion. Age indeterminate rib fractures bilaterally.   Electronically Signed   By: Camie Patience M.D.   On: 01/03/2014 21:19   Micro Results: No results found for this or any previous visit (from the past 240 hour(s)). Studies/Results: Dg Abd Acute W/chest  01/03/2014   CLINICAL DATA:  Back pain, weakness and abdominal distention after falling 5 days ago.  EXAM: ACUTE ABDOMEN SERIES (ABDOMEN 2 VIEW & CHEST 1 VIEW)  COMPARISON:  Lumbar spine radiographs 12/29/2013. Two view chest 12/10/2012.  FINDINGS: There are lower lung volumes with new left lower lobe patchy airspace disease. There may be a small left pleural effusion. No pneumothorax is demonstrated. The heart size and mediastinal contours are normal. Lower rib fractures are noted bilaterally, age indeterminate.  Abdominal radiographs demonstrate mild diffuse small bowel distension. There is gas throughout the colon which is not dilated. There is no free intraperitoneal air. Prostate brachytherapy seeds are noted. Mild degenerative changes are present within the lumbar spine.  IMPRESSION: 1. Mild small bowel distention, favored to reflect an ileus. No evidence of high-grade mechanical obstruction. 2. New left basilar airspace disease and possible left pleural effusion. Age indeterminate rib fractures bilaterally.   Electronically Signed   By: Camie Patience M.D.   On: 01/03/2014 21:19   Medications:  I have reviewed the patient's current medications Scheduled Meds: . aspirin EC  81 mg Oral Daily  . azithromycin  250 mg Oral Daily  . carbidopa-levodopa  2 tablet Oral TID  . cefTRIAXone  (ROCEPHIN)  IV  1 g Intravenous Q24H  . insulin aspart  0-9 Units Subcutaneous 6 times per day  . potassium chloride  20 mEq Oral TID  Continuous Infusions: . sodium chloride 50 mL/hr at 01/04/14 1300   PRN Meds:.acetaminophen   Assessment/Plan: #1. Pneumonia. Continue ceftriaxone and azithromycin. #2. Hyponatremia. Serum sodium is improved to 132. #3. Hypokalemia. Serum potassium has dropped to 3.4. Supplement orally. #4. Scalp lacerations. Stable. #5. Left radius fracture. Orthopedic consult pending. #6. Parkinson's disease. Arrange SNF for rehabilitation. #7. Possible UTI. Culture pending. #8. Dehydration. Resolving. Principal Problem:   Dehydration, mild Active Problems:   Parkinson's disease   Mild dementia   Gait difficulty   Hyponatremia   Ileus   CAP (community acquired pneumonia)     LOS: 2 days   Kalii Chesmore 01/05/2014, 7:45 AM

## 2014-01-05 NOTE — Clinical Social Work Placement (Signed)
Clinical Social Work Department CLINICAL SOCIAL WORK PLACEMENT NOTE 01/05/2014  Patient:  Stephen Bates, Stephen Bates  Account Number:  [id number] Admit date:  01/03/2014  Clinical Social Worker:  Benay Pike, LCSW  Date/time:  01/05/2014 08:53 AM  Clinical Social Work is seeking post-discharge placement for this patient at the following level of care:   [place]   (*CSW will update this form in Epic as items are completed)   01/05/2014  Patient/family provided with [facility] Department of Clinical Social Work's list of facilities offering this level of care within the geographic area requested by the patient (or if unable, by the patient's family).  01/05/2014  Patient/family informed of their freedom to choose among providers that offer the needed level of care, that participate in Medicare, Medicaid or managed care program needed by the patient, have an available bed and are willing to accept the patient.  01/05/2014  Patient/family informed of [facility]' ownership interest in [facility], as well as of the fact that they are under no obligation to receive care at this facility.  PASARR submitted to EDS on 01/05/2014 PASARR number received on 01/05/2014  FL2 transmitted to all facilities in geographic area requested by pt/family on  01/05/2014 FL2 transmitted to all facilities within larger geographic area on   Patient informed that his/her managed care company has contracts with or will negotiate with  certain facilities, including the following:     Patient/family informed of bed offers received:   Patient chooses bed at  Physician recommends and patient chooses bed at    Patient to be transferred to  on   Patient to be transferred to facility by  Patient and family notified of transfer on  Name of family member notified:    The following physician request were entered in Epic:   Additional Comments:  Benay Pike, [place]

## 2014-01-05 NOTE — Clinical Social Work Psychosocial (Signed)
Clinical Social Work Department BRIEF PSYCHOSOCIAL ASSESSMENT 01/05/2014  Patient:  Stephen Bates, Stephen Bates     Account Number:  [id number]     Admit date:  01/03/2014  Clinical Social Worker:  Wyatt Haste  Date/Time:  01/05/2014 09:01 AM  Referred by:  CSW  Date Referred:  01/05/2014 Referred for  SNF Placement   Other Referral:   Interview type:  Patient Other interview type:    PSYCHOSOCIAL DATA Living Status:  WIFE Admitted from facility:   Level of care:   Primary support name:  Mary Primary support relationship to patient:  SPOUSE Degree of support available:   supportive    CURRENT CONCERNS Current Concerns  Post-Acute Placement   Other Concerns:    SOCIAL WORK ASSESSMENT / PLAN CSW met with pt at bedside. Pt alert and oriented and reports he lives with his wife. Pt fell recently and was seeing Dr. Romona Curls who sent him to ED to be admitted. Pt has laceration to head and radial fracture. Also diagnosed with pneumonia. Since his fall, pt has required assist from his wife with ADLs. He was ambulating short distances with his walker, but has been unable to do that now. 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 Pt reports he didn't have a choice as it was too much for him to continue to do. CSW discussed this adjustment with pt. PT evaluated pt yesterday and recommendation is for SNF. Discussed placement process, including Medicare coverage/criteria. Pt is very hesitant to commit to SNF. He states he wants to think it over further, but knows that Dr. Willey Blade would like for him to go. Pt agreed to initiate bed search in [place] only with understanding that he could change his mind and return home if he chose to. SNF list provided.   Assessment/plan status:  Psychosocial Support/Ongoing Assessment of Needs Other assessment/ plan:   Information/referral to community resources:   SNF list    PATIENT'S/FAMILY'S RESPONSE  TO PLAN OF CARE: Pt is hopeful that he will improve enough to return home from hospital, but is open to consider SNF if necessary. CSW will follow up with bed offers when available.       Benay Pike, [place]

## 2014-01-05 NOTE — Progress Notes (Signed)
01/05/14 Recommend changing CBG testing and Novolog correction to tid ac and QHS  Gentry Fitz, RN, [place], [place], CDE Diabetes Coordinator Inpatient Diabetes Program  [phone] (Team Pager) [phone] [facility]) 01/05/2014 11:27 AM

## 2014-01-05 NOTE — Progress Notes (Signed)
Surgery  Filed Vitals:   01/05/14 0437  BP: 158/84  Pulse: 70  Temp: 98.4 F (36.9 C)  Resp: 18   Wounds much,much cleaner.  Wound care explained IN DETAIL to nursing staff and hopefully will follow him to his step down care facility.  The family knows to follow up with me. As well and they have my cell.  From my point of view I suggest:  1 no Lovenox. 2. Place no tape directly on his skin, wrap with roll gauze and then tape. 3. Progressive ambulation with fall precautions.   Again I will be glad to follow pt's wound care as OP.

## 2014-01-06 DIAGNOSIS — S069X9A Unspecified intracranial injury with loss of consciousness of unspecified duration, initial encounter: Secondary | ICD-10-CM | POA: Diagnosis not present

## 2014-01-06 DIAGNOSIS — E871 Hypo-osmolality and hyponatremia: Secondary | ICD-10-CM | POA: Diagnosis not present

## 2014-01-06 LAB — URINALYSIS, ROUTINE W REFLEX MICROSCOPIC
BILIRUBIN URINE: NEGATIVE
Glucose, UA: NEGATIVE mg/dL
Ketones, ur: 40 mg/dL — AB
Leukocytes, UA: NEGATIVE
Nitrite: NEGATIVE
Protein, ur: NEGATIVE mg/dL
SPECIFIC GRAVITY, URINE: 1.01 (ref 1.005–1.030)
UROBILINOGEN UA: 2 mg/dL — AB (ref 0.0–1.0)
pH: 6.5 (ref 5.0–8.0)

## 2014-01-06 LAB — BASIC METABOLIC PANEL
Anion gap: 15 (ref 5–15)
BUN: 4 mg/dL — AB (ref 6–23)
CALCIUM: 8.9 mg/dL (ref 8.4–10.5)
CO2: 23 meq/L (ref 19–32)
CREATININE: 0.3 mg/dL — AB (ref 0.50–1.35)
Chloride: 94 mEq/L — ABNORMAL LOW (ref 96–112)
GFR calc Af Amer: >90 mL/min (ref >90)
GFR calc non Af Amer: >90 mL/min (ref >90)
GLUCOSE: 98 mg/dL (ref 70–99)
Potassium: 3.9 mEq/L (ref 3.7–5.3)
Sodium: 132 mEq/L — ABNORMAL LOW (ref 137–147)

## 2014-01-06 LAB — GLUCOSE, CAPILLARY
GLUCOSE-CAPILLARY: 91 mg/dL (ref 70–99)
Glucose-Capillary: 109 mg/dL — ABNORMAL HIGH (ref 70–99)
Glucose-Capillary: 86 mg/dL (ref 70–99)

## 2014-01-06 LAB — URINE MICROSCOPIC-ADD ON

## 2014-01-06 NOTE — Care Management Note (Signed)
    Page 1 of 1   01/06/2014     11:40:58 AM CARE MANAGEMENT NOTE 01/06/2014  Patient:  Stephen Bates, Stephen Bates   Account Number:  [id number]  Date Initiated:  01/06/2014  Documentation initiated by:  Vladimir Creeks  Subjective/Objective Assessment:   Admitted with PNA, and falls at home. Has lg dsg on head from a wound from recent fall. Pt is from home with spouse. They would like for him to go to rehab, short term, for strengthening, and mobility to see if he can improve, and hopefully     Action/Plan:   stop the falls. CSW aware   Anticipated DC Date:  01/07/2014   Anticipated DC Plan:  SKILLED NURSING FACILITY  In-house referral  Clinical Social Worker      DC Planning Services  CM consult      Choice offered to / List presented to:             Status of service:  Completed, signed off Medicare Important Message given?  YES (If response is "NO", the following Medicare IM given date fields will be blank) Date Medicare IM given:  01/06/2014 Medicare IM given by:  Vladimir Creeks Date Additional Medicare IM given:   Additional Medicare IM given by:    Discharge Disposition:  [place]  Per UR Regulation:  Reviewed for med. necessity/level of care/duration of stay  If discussed at [place] of Stay Meetings, dates discussed:    Comments:  01/06/14 [place] RN/CM

## 2014-01-06 NOTE — Clinical Social Work Placement (Signed)
Clinical Social Work Department CLINICAL SOCIAL WORK PLACEMENT NOTE 01/06/2014  Patient:  NYEEM, STOKE  Account Number:  [id number] Admit date:  01/03/2014  Clinical Social Worker:  Benay Pike, LCSW  Date/time:  01/05/2014 08:53 AM  Clinical Social Work is seeking post-discharge placement for this patient at the following level of care:   [place]   (*CSW will update this form in Epic as items are completed)   01/05/2014  Patient/family provided with [facility] Department of Clinical Social Work's list of facilities offering this level of care within the geographic area requested by the patient (or if unable, by the patient's family).  01/05/2014  Patient/family informed of their freedom to choose among providers that offer the needed level of care, that participate in Medicare, Medicaid or managed care program needed by the patient, have an available bed and are willing to accept the patient.  01/05/2014  Patient/family informed of [facility]' ownership interest in [facility], as well as of the fact that they are under no obligation to receive care at this facility.  PASARR submitted to EDS on 01/05/2014 PASARR number received on 01/05/2014  FL2 transmitted to all facilities in geographic area requested by pt/family on  01/05/2014 FL2 transmitted to all facilities within larger geographic area on   Patient informed that his/her managed care company has contracts with or will negotiate with  certain facilities, including the following:     Patient/family informed of bed offers received:  01/06/2014 Patient chooses bed at [facility] Physician recommends and patient chooses bed at  [facility]  Patient to be transferred to  on   Patient to be transferred to facility by  Patient and family notified of transfer on  Name of family member notified:    The following physician request were entered in Epic:   Additional Comments:  Benay Pike, [place]

## 2014-01-06 NOTE — Clinical Social Work Note (Signed)
CSW presented bed offers and pt accepts [facility]. Pt's wife also present and agreeable. Awaiting stability for d/c. Pt okay to d/c to SNF over weekend if stable.  Benay Pike, [place]

## 2014-01-06 NOTE — Progress Notes (Signed)
Subjective: Stephen Bates is awake and alert and breathing comfortably. He states his breathing is improved. He's had no fever. His oxygen saturation is 99% on room air. Is not having significant pain from his head wounds.  Objective: Vital signs in last 24 hours: Filed Vitals:   01/05/14 0437 01/05/14 1618 01/05/14 2005 01/06/14 0434  BP: 158/84 134/64 168/75 183/92  Pulse: 70 75 74 89  Temp: 98.4 F (36.9 C) 98.7 F (37.1 C) 98.2 F (36.8 C) 98 F (36.7 C)  TempSrc: Oral Oral Oral Oral  Resp: 18 18 16 16   Height:      Weight:      SpO2: 97% 97% 98% 99%   Weight change:   Intake/Output Summary (Last 24 hours) at 01/06/14 0745 Last data filed at 01/06/14 0439  Gross per 24 hour  Intake    480 ml  Output    200 ml  Net    280 ml    Physical Exam: No distress. Lungs clear. Heart regular with no murmurs. Abdomen soft and nontender. His head wounds appear stable with no signs of infection.  Lab Results:    Results for orders placed during the hospital encounter of 01/03/14 (from the past 24 hour(s))  GLUCOSE, CAPILLARY     Status: Abnormal   Collection Time    01/05/14 11:34 AM      Result Value Ref Range   Glucose-Capillary 111 (*) 70 - 99 mg/dL   Comment 1 Notify RN    GLUCOSE, CAPILLARY     Status: Abnormal   Collection Time    01/05/14  4:29 PM      Result Value Ref Range   Glucose-Capillary 110 (*) 70 - 99 mg/dL   Comment 1 Notify RN    GLUCOSE, CAPILLARY     Status: Abnormal   Collection Time    01/05/14  8:02 PM      Result Value Ref Range   Glucose-Capillary 109 (*) 70 - 99 mg/dL  GLUCOSE, CAPILLARY     Status: Abnormal   Collection Time    01/05/14 11:59 PM      Result Value Ref Range   Glucose-Capillary 109 (*) 70 - 99 mg/dL  BASIC METABOLIC PANEL     Status: Abnormal   Collection Time    01/06/14  4:54 AM      Result Value Ref Range   Sodium 132 (*) 137 - 147 mEq/L   Potassium 3.9  3.7 - 5.3 mEq/L   Chloride 94 (*) 96 - 112 mEq/L   CO2 23  19 -  32 mEq/L   Glucose, Bld 98  70 - 99 mg/dL   BUN 4 (*) 6 - 23 mg/dL   Creatinine, Ser 0.30 (*) 0.50 - 1.35 mg/dL   Calcium 8.9  8.4 - 10.5 mg/dL   GFR calc non Af Amer >90  >90 mL/min   GFR calc Af Amer >90  >90 mL/min   Anion gap 15  5 - 15     ABGS No results found for this basename: PHART, PCO2, PO2ART, TCO2, HCO3,  in the last 72 hours CULTURES Recent Results (from the past 240 hour(s))  URINE CULTURE     Status: None   Collection Time    01/03/14  8:49 PM      Result Value Ref Range Status   Specimen Description URINE, CLEAN CATCH   Final   Special Requests NONE   Final   Culture  Setup Time  Final   Value: 01/04/2014 13:37     Performed at [place]     Final   Value: NO GROWTH     Performed at [facility]   Culture     Final   Value: NO GROWTH     Performed at [facility]   Report Status 01/05/2014 FINAL   Final   Studies/Results: No results found. Micro Results: Recent Results (from the past 240 hour(s))  URINE CULTURE     Status: None   Collection Time    01/03/14  8:49 PM      Result Value Ref Range Status   Specimen Description URINE, CLEAN CATCH   Final   Special Requests NONE   Final   Culture  Setup Time     Final   Value: 01/04/2014 13:37     Performed at [place]     Final   Value: NO GROWTH     Performed at [facility]   Culture     Final   Value: NO GROWTH     Performed at [facility]   Report Status 01/05/2014 FINAL   Final   Studies/Results: No results found. Medications:  I have reviewed the patient's current medications Scheduled Meds: . aspirin EC  81 mg Oral Daily  . azithromycin  250 mg Oral Daily  . carbidopa-levodopa  2 tablet Oral TID  . cefTRIAXone (ROCEPHIN)  IV  1 g Intravenous Q24H  . insulin aspart  0-9 Units Subcutaneous 6 times per day  . potassium chloride  20 mEq Oral TID  . spiritus frumenti  1 each Oral Q supper   Continuous  Infusions: . sodium chloride 50 mL/hr at 01/04/14 1300   PRN Meds:.acetaminophen   Assessment/Plan: #1. Pneumonia. Improving. Continue ceftriaxone and azithromycin. #2. Hyponatremia. Unchanged at 132. #3. Hypokalemia. Now normal at 3.9. #4. Dehydration. Improved. #5. Parkinson's disease. Stressed importance of this participation with physical therapy. It appears he will need skilled nursing and additional rehabilitation. #6. Hematuria. Urine culture is negative. Recheck urinalysis. #7. Head wounds. Continue dressing changes. #8. Diabetes. Fasting glucose is 98. Continue holding metformin. #9. Hypertension. Continue lisinopril without hydrochlorothiazide. Principal Problem:   Dehydration, mild Active Problems:   Parkinson's disease   Mild dementia   Gait difficulty   Hyponatremia   Ileus   CAP (community acquired pneumonia)     LOS: 3 days   Yanel Dombrosky 01/06/2014, 7:45 AM

## 2014-01-07 ENCOUNTER — Inpatient Hospital Stay
Admission: RE | Admit: 2014-01-07 | Discharge: 2014-03-08 | Disposition: A | Discharge status: Home / Self Care | Payer: BLUE CROSS/BLUE SHIELD | Source: Ambulatory Visit | Attending: Internal Medicine | Admitting: Internal Medicine

## 2014-01-07 DIAGNOSIS — M6281 Muscle weakness (generalized): Secondary | ICD-10-CM | POA: Diagnosis not present

## 2014-01-07 DIAGNOSIS — M546 Pain in thoracic spine: Secondary | ICD-10-CM

## 2014-01-07 DIAGNOSIS — G20 Parkinson's disease: Secondary | ICD-10-CM | POA: Diagnosis not present

## 2014-01-07 DIAGNOSIS — R489 Unspecified symbolic dysfunctions: Secondary | ICD-10-CM | POA: Diagnosis not present

## 2014-01-07 DIAGNOSIS — F411 Generalized anxiety disorder: Secondary | ICD-10-CM | POA: Diagnosis not present

## 2014-01-07 DIAGNOSIS — Z8546 Personal history of malignant neoplasm of prostate: Secondary | ICD-10-CM | POA: Diagnosis not present

## 2014-01-07 DIAGNOSIS — M549 Dorsalgia, unspecified: Secondary | ICD-10-CM | POA: Diagnosis present

## 2014-01-07 DIAGNOSIS — G8929 Other chronic pain: Secondary | ICD-10-CM

## 2014-01-07 DIAGNOSIS — M545 Low back pain, unspecified: Secondary | ICD-10-CM | POA: Diagnosis not present

## 2014-01-07 DIAGNOSIS — E119 Type 2 diabetes mellitus without complications: Secondary | ICD-10-CM | POA: Diagnosis not present

## 2014-01-07 DIAGNOSIS — Z8673 Personal history of transient ischemic attack (TIA), and cerebral infarction without residual deficits: Secondary | ICD-10-CM | POA: Diagnosis not present

## 2014-01-07 DIAGNOSIS — S069XAA Unspecified intracranial injury with loss of consciousness status unknown, initial encounter: Secondary | ICD-10-CM | POA: Diagnosis not present

## 2014-01-07 DIAGNOSIS — IMO0002 Reserved for concepts with insufficient information to code with codable children: Secondary | ICD-10-CM

## 2014-01-07 DIAGNOSIS — I10 Essential (primary) hypertension: Secondary | ICD-10-CM | POA: Diagnosis not present

## 2014-01-07 DIAGNOSIS — W19XXXA Unspecified fall, initial encounter: Secondary | ICD-10-CM | POA: Diagnosis not present

## 2014-01-07 DIAGNOSIS — I7090 Unspecified atherosclerosis: Secondary | ICD-10-CM | POA: Diagnosis not present

## 2014-01-07 DIAGNOSIS — S22089A Unspecified fracture of T11-T12 vertebra, initial encounter for closed fracture: Secondary | ICD-10-CM

## 2014-01-07 DIAGNOSIS — F0390 Unspecified dementia without behavioral disturbance: Secondary | ICD-10-CM | POA: Diagnosis not present

## 2014-01-07 DIAGNOSIS — Z09 Encounter for follow-up examination after completed treatment for conditions other than malignant neoplasm: Secondary | ICD-10-CM | POA: Diagnosis not present

## 2014-01-07 DIAGNOSIS — Z87891 Personal history of nicotine dependence: Secondary | ICD-10-CM | POA: Diagnosis not present

## 2014-01-07 DIAGNOSIS — R279 Unspecified lack of coordination: Secondary | ICD-10-CM | POA: Diagnosis not present

## 2014-01-07 DIAGNOSIS — S069X9A Unspecified intracranial injury with loss of consciousness of unspecified duration, initial encounter: Secondary | ICD-10-CM | POA: Diagnosis not present

## 2014-01-07 DIAGNOSIS — C61 Malignant neoplasm of prostate: Secondary | ICD-10-CM | POA: Diagnosis not present

## 2014-01-07 DIAGNOSIS — R262 Difficulty in walking, not elsewhere classified: Secondary | ICD-10-CM | POA: Diagnosis not present

## 2014-01-07 DIAGNOSIS — F3289 Other specified depressive episodes: Secondary | ICD-10-CM | POA: Diagnosis not present

## 2014-01-07 DIAGNOSIS — R41841 Cognitive communication deficit: Secondary | ICD-10-CM | POA: Diagnosis not present

## 2014-01-07 DIAGNOSIS — R1310 Dysphagia, unspecified: Secondary | ICD-10-CM | POA: Diagnosis not present

## 2014-01-07 DIAGNOSIS — M5137 Other intervertebral disc degeneration, lumbosacral region: Secondary | ICD-10-CM | POA: Diagnosis not present

## 2014-01-07 DIAGNOSIS — S22009A Unspecified fracture of unspecified thoracic vertebra, initial encounter for closed fracture: Secondary | ICD-10-CM | POA: Diagnosis not present

## 2014-01-07 DIAGNOSIS — E871 Hypo-osmolality and hyponatremia: Secondary | ICD-10-CM | POA: Diagnosis not present

## 2014-01-07 DIAGNOSIS — J189 Pneumonia, unspecified organism: Secondary | ICD-10-CM | POA: Diagnosis not present

## 2014-01-07 DIAGNOSIS — M8448XA Pathological fracture, other site, initial encounter for fracture: Secondary | ICD-10-CM | POA: Diagnosis not present

## 2014-01-07 DIAGNOSIS — M459 Ankylosing spondylitis of unspecified sites in spine: Secondary | ICD-10-CM | POA: Diagnosis not present

## 2014-01-07 DIAGNOSIS — M47817 Spondylosis without myelopathy or radiculopathy, lumbosacral region: Secondary | ICD-10-CM | POA: Diagnosis not present

## 2014-01-07 DIAGNOSIS — F329 Major depressive disorder, single episode, unspecified: Secondary | ICD-10-CM | POA: Diagnosis not present

## 2014-01-07 DIAGNOSIS — J90 Pleural effusion, not elsewhere classified: Secondary | ICD-10-CM | POA: Diagnosis not present

## 2014-01-07 DIAGNOSIS — Z9889 Other specified postprocedural states: Secondary | ICD-10-CM | POA: Diagnosis not present

## 2014-01-07 DIAGNOSIS — R269 Unspecified abnormalities of gait and mobility: Secondary | ICD-10-CM | POA: Diagnosis not present

## 2014-01-07 DIAGNOSIS — Z9181 History of falling: Secondary | ICD-10-CM | POA: Diagnosis not present

## 2014-01-07 DIAGNOSIS — R5381 Other malaise: Secondary | ICD-10-CM | POA: Diagnosis not present

## 2014-01-07 DIAGNOSIS — M51379 Other intervertebral disc degeneration, lumbosacral region without mention of lumbar back pain or lower extremity pain: Secondary | ICD-10-CM | POA: Diagnosis not present

## 2014-01-07 LAB — GLUCOSE, CAPILLARY: Glucose-Capillary: 77 mg/dL (ref 70–99)

## 2014-01-07 MED ORDER — AZITHROMYCIN 250 MG PO TABS: ORAL_TABLET | ORAL | Status: DC

## 2014-01-07 MED ORDER — SPIRITUS FRUMENTI: 1.0000 | Freq: Every day | ORAL | Status: DC

## 2014-01-07 MED ORDER — CEFUROXIME AXETIL 500 MG PO TABS: 500.0000 mg | ORAL_TABLET | Freq: Two times a day (BID) | ORAL | Status: DC

## 2014-01-07 NOTE — Progress Notes (Signed)
Pt discharged to [facility] today per Dr. Willey Blade. Pt's IV site D/C'd and WNL. Pt's VSS. Report called to [facility], Therapist, art. Verbalized understanding. Pt left floor in stable condition via hospital bed accompanied by NT.

## 2014-01-07 NOTE — Discharge Summary (Signed)
Physician Discharge Summary  Stephen Bates VWU:[id number] DOB: 1926-10-17 DOA: 01/03/2014   Admit date: 01/03/2014 Discharge date: 01/07/2014  Discharge Diagnoses:  Principal Problem:   Dehydration, mild Active Problems:   Parkinson's disease   Mild dementia   Gait difficulty   Hyponatremia   Ileus   CAP (community acquired pneumonia)  head laceration Hyponatremia Hypokalemia Hematuria Type 2 diabetes Hypertension  Wt Readings from Last 3 Encounters:  01/03/14 127 lb 6.8 oz (57.8 kg)  12/29/13 139 lb (63.05 kg)  10/18/13 139 lb 8 oz (63.277 kg)     Hospital Course:  Stephen Bates is an 80 year old male who presented with weakness and difficulty walking. He had suffered a head injury at home sustaining a triangular laceration on the top of his he had which had been addressed at home by Dr. Romona Curls after initial assessment at the emergency room. On presentation he was found to be hyponatremic at 124. He was hypokalemic. He was weak and dehydrated and was started on IV fluids. Serum sodium gradually improved to 132. Hypokalemia was normalized with supplemental potassium. He had experienced cough at home and was found to have a left lower lobe infiltrate consistent with pneumonia. There was no fever. He was treated with ceftriaxone and azithromycin. Oxygen saturation has remained normal. Diabetes control has been stable off metformin which was held during his hospitalization. Hypertension has been treated with benazepril without hydrochlorothiazide because of his hypokalemia. His initial urinalysis was abnormal however his culture was negative. Hematuria was present which was resolved on a second urinalysis. There have been no known trauma to his kidney at the time of his fall. He had previously undergone a CT scan of the head which revealed no sign of skull fracture or subdural hematoma.  His condition gradually improved. He was treated with physical therapy. Arrangements are being made  for transfer to a skilled nursing facility for additional rehabilitation. He will continue Sinemet 3 times a day for his Parkinson's disease. He will continue antibiotic therapy with 2 additional doses of azithromycin and will continue cephalosporin therapy with cefuroxime twice daily for 5 more days.  Please obtain a BMP in one week.  Wound care will be continued at SNF. The wound to be healing satisfactorily at this point with no signs of infection.   Discharge Instructions     Medication List    STOP taking these medications       hydrochlorothiazide 25 MG tablet  Commonly known as:  HYDRODIURIL     metFORMIN 500 MG 24 hr tablet  Commonly known as:  GLUCOPHAGE-XR      TAKE these medications       aspirin EC 81 MG tablet  Take 81 mg by mouth daily.     azithromycin 250 MG tablet  Commonly known as:  ZITHROMAX  One today and tommorrow     benazepril 40 MG tablet  Commonly known as:  LOTENSIN  Take 1 tablet by mouth every morning.     carbidopa-levodopa 25-100 MG per tablet  Commonly known as:  SINEMET IR  Take 2 tablets by mouth 3 (three) times daily.     cefUROXime 500 MG tablet  Commonly known as:  CEFTIN  Take 1 tablet (500 mg total) by mouth 2 (two) times daily with a meal.     CENTRUM SILVER ADULT 50+ PO  Take 1 tablet by mouth every morning.     spiritus frumenti Soln  Commonly known as:  ethyl alcohol  Take 1 each  by mouth daily with supper.     vitamin B-12 1000 MCG tablet  Commonly known as:  CYANOCOBALAMIN  Take 1,000 mcg by mouth every morning.         Stephen Bates 01/07/2014

## 2014-01-09 LAB — GLUCOSE, CAPILLARY
GLUCOSE-CAPILLARY: 105 mg/dL — AB (ref 70–99)
Glucose-Capillary: 108 mg/dL — ABNORMAL HIGH (ref 70–99)
Glucose-Capillary: 112 mg/dL — ABNORMAL HIGH (ref 70–99)

## 2014-01-09 NOTE — Progress Notes (Signed)
UR chart review completed.  

## 2014-01-10 LAB — GLUCOSE, CAPILLARY
GLUCOSE-CAPILLARY: 222 mg/dL — AB (ref 70–99)
GLUCOSE-CAPILLARY: 204 mg/dL — AB (ref 70–99)
Glucose-Capillary: 118 mg/dL — ABNORMAL HIGH (ref 70–99)

## 2014-01-11 DIAGNOSIS — S069XAA Unspecified intracranial injury with loss of consciousness status unknown, initial encounter: Secondary | ICD-10-CM | POA: Diagnosis not present

## 2014-01-11 DIAGNOSIS — E871 Hypo-osmolality and hyponatremia: Secondary | ICD-10-CM | POA: Diagnosis not present

## 2014-01-11 DIAGNOSIS — S069X9A Unspecified intracranial injury with loss of consciousness of unspecified duration, initial encounter: Secondary | ICD-10-CM | POA: Diagnosis not present

## 2014-01-11 LAB — GLUCOSE, CAPILLARY
GLUCOSE-CAPILLARY: 181 mg/dL — AB (ref 70–99)
Glucose-Capillary: 124 mg/dL — ABNORMAL HIGH (ref 70–99)

## 2014-01-12 LAB — GLUCOSE, CAPILLARY
GLUCOSE-CAPILLARY: 198 mg/dL — AB (ref 70–99)
Glucose-Capillary: 131 mg/dL — ABNORMAL HIGH (ref 70–99)

## 2014-01-13 LAB — GLUCOSE, CAPILLARY: Glucose-Capillary: 109 mg/dL — ABNORMAL HIGH (ref 70–99)

## 2014-01-13 NOTE — H&P (Unsigned)
NAMEMATAEO, Stephen Bates              ACCOUNT NO.:  [id number]  MEDICAL RECORD NO.:  [id number]  LOCATION:  S102                          FACILITY:  [facility]  PHYSICIAN:  Paula Compton. Willey Blade, MD       DATE OF BIRTH:  [birth year]  DATE OF ADMISSION:  01/07/2014 DATE OF DISCHARGE:                             HISTORY & PHYSICAL   HISTORY OF PRESENT ILLNESS:  This patient is an 80 year old male who was transferred to the [facility] from [facility] for rehabilitation following a recent head injury suffered from a fall at home.  The patient has Parkinson disease and has developed increasing difficulty walking.  He fell at home and sustained a laceration to the top of his head.  He was seen in surgical consultation by Dr. Romona Curls. His wounds have shown no signs of infection.  He was diagnosed with pneumonia while hospitalized after his x-ray revealed a left lower lobe infiltrate consistent with pneumonia.  He had a mild cough, but no purulent sputum production or fever.  He has been treated with Rocephin and azithromycin.  He has finished the azithromycin.  Rocephin was modified to cefuroxime orally at discharge.  He was dehydrated, hyponatremic, and hypokalemic but improved with IV fluids and potassium supplementation.  He had been eating better but then since arriving at the [facility], he has had limited intake and diminished appetite necessitating the initiation of mirtazapine 7.5 mg at night.  PAST MEDICAL HISTORY: 1. Parkinson disease. 2. Diabetes. 3. Carcinoma of the prostate, treated with radioactive seeds in 2002. 4. Stroke. 5. Right bundle-branch block. 6. BPH. 7. Osteoarthritis. 8. Multiple skin cancers. 9. Colon adenomas. 10.Hypertension.  MEDICATIONS:  Aspirin 81 mg daily, benazepril 40 mg daily, Sinemet 25/100 two tablets t.i.d., cefuroxime 500 mg twice a day, Centrum Silver daily, vitamin B12 1000 mcg tablet daily, and Tylenol p.r.n.  ALLERGIES:  Penicillin and  sulfa.  FAMILY HISTORY:  His father had bladder cancer, his mother had bronchiectasis, a brother has Alzheimer disease, a sister had colon cancer, and a brother had bladder cancer.  SOCIAL HISTORY:  He has a drink of wine daily.  He does not smoke or use drugs.  He is a retired Conservation officer, historic buildings.  REVIEW OF SYSTEMS:  He has had no syncope, chest pain, fever, or abdominal pain.  He has recently been constipated.  He has not had bleeding.  He is able to void effectively.  PHYSICAL EXAMINATION:  GENERAL:  Alert, and in no distress. HEENT:  No scleral icterus.  Nose and oropharynx unremarkable.  His head wounds appear to be healing well with no signs of infection. NECK:  Supple with no JVD or thyromegaly. LUNGS:  Clear. HEART:  Regular with no murmurs. ABDOMEN:  Nontender with no organomegaly. EXTREMITIES:  Normal pulses with no edema. NEURO:  No focal weakness.  Gait was not tested at this time. SKIN:  Multiple bruises.  Venous insufficiency changes are present in the legs. LYMPH NODES:  Reveal no cervical or supraclavicular enlargement.  IMPRESSIONS/PLAN: 1. Head injury, healing slowly without signs of infection.  Continue     dressing changes. 2. Parkinson disease.  Continue Sinemet.  Continue physical therapy. 3.  Diabetes, stable control on nondrug treatment. 4. Hypertension.  Continue benazepril. 5. Hyponatremia.  Hold off on hydrochlorothiazide at this point.     Repeat metabolic profile. 6. Stroke. Continue aspirin. 7. Pneumonia.  Finish antibiotic therapy. 8. Prostate carcinoma.  No recurrence.     Paula Compton. Willey Blade, MD     ROF/MEDQ  D:  01/12/2014  T:  01/13/2014  Job:  [id number]

## 2014-01-14 LAB — GLUCOSE, CAPILLARY
GLUCOSE-CAPILLARY: 222 mg/dL — AB (ref 70–99)
GLUCOSE-CAPILLARY: 128 mg/dL — AB (ref 70–99)

## 2014-01-15 LAB — GLUCOSE, CAPILLARY
Glucose-Capillary: 129 mg/dL — ABNORMAL HIGH (ref 70–99)
Glucose-Capillary: 133 mg/dL — ABNORMAL HIGH (ref 70–99)

## 2014-01-16 LAB — GLUCOSE, CAPILLARY
GLUCOSE-CAPILLARY: 159 mg/dL — AB (ref 70–99)
GLUCOSE-CAPILLARY: 180 mg/dL — AB (ref 70–99)
Glucose-Capillary: 183 mg/dL — ABNORMAL HIGH (ref 70–99)

## 2014-01-17 LAB — GLUCOSE, CAPILLARY
Glucose-Capillary: 129 mg/dL — ABNORMAL HIGH (ref 70–99)
Glucose-Capillary: 222 mg/dL — ABNORMAL HIGH (ref 70–99)

## 2014-01-18 LAB — GLUCOSE, CAPILLARY
GLUCOSE-CAPILLARY: 145 mg/dL — AB (ref 70–99)
Glucose-Capillary: 185 mg/dL — ABNORMAL HIGH (ref 70–99)

## 2014-01-19 ENCOUNTER — Telehealth: Payer: Self-pay | Admitting: Diagnostic Neuroimaging

## 2014-01-19 ENCOUNTER — Ambulatory Visit: Payer: BLUE CROSS/BLUE SHIELD | Admitting: Diagnostic Neuroimaging

## 2014-01-19 LAB — GLUCOSE, CAPILLARY: Glucose-Capillary: 158 mg/dL — ABNORMAL HIGH (ref 70–99)

## 2014-01-19 NOTE — Telephone Encounter (Signed)
Patient's wife called and they cannot make it to today's appointment. The caretakers could not get the patient into the vehicle.

## 2014-01-19 NOTE — Telephone Encounter (Signed)
FYI. Please review previous note. Thanks

## 2014-01-19 NOTE — Telephone Encounter (Signed)
Noted. -VRP 

## 2014-01-20 ENCOUNTER — Telehealth: Payer: Self-pay | Admitting: Diagnostic Neuroimaging

## 2014-01-20 LAB — GLUCOSE, CAPILLARY
GLUCOSE-CAPILLARY: 199 mg/dL — AB (ref 70–99)
Glucose-Capillary: 130 mg/dL — ABNORMAL HIGH (ref 70–99)

## 2014-01-20 NOTE — Telephone Encounter (Signed)
I called back.  Stephen Bates says the patient is currently taking Carb/Levo two tabs three times daily, but is still having tremors.  Says he fell last month and was hospitalized, then released to [facility] for PT.  They tried to transport patient here for appt, but says it took the facility over 45 minutes to get him into the [facility], so they decided it was best he not be transported.  Indicates the patient is having a lot of bad dreams, but does not feel like he has hallucinations.  Would like a message sent to the provider regarding med management.  Wants to know if dose should be changed or if something else can be added.  Please advise.  Thank you.  Also noted they have contacted PCP for Zofran due to nausea the patient is experiencing.

## 2014-01-20 NOTE — Telephone Encounter (Signed)
Daughter Carlynn Herald @ [phone], questioning if medication Levodopa need to be increased.  Patient is unable to transport due to unable to stand.  Please call and advise.

## 2014-01-21 LAB — GLUCOSE, CAPILLARY
Glucose-Capillary: 212 mg/dL — ABNORMAL HIGH (ref 70–99)
Glucose-Capillary: 107 mg/dL — ABNORMAL HIGH (ref 70–99)
Glucose-Capillary: 227 mg/dL — ABNORMAL HIGH (ref 70–99)

## 2014-01-22 LAB — GLUCOSE, CAPILLARY
Glucose-Capillary: 123 mg/dL — ABNORMAL HIGH (ref 70–99)
Glucose-Capillary: 122 mg/dL — ABNORMAL HIGH (ref 70–99)

## 2014-01-23 ENCOUNTER — Ambulatory Visit (HOSPITAL_COMMUNITY): Payer: No Typology Code available for payment source | Attending: Internal Medicine

## 2014-01-23 ENCOUNTER — Telehealth: Payer: Self-pay | Admitting: Orthopedic Surgery

## 2014-01-23 DIAGNOSIS — IMO0002 Reserved for concepts with insufficient information to code with codable children: Secondary | ICD-10-CM | POA: Insufficient documentation

## 2014-01-23 DIAGNOSIS — M5137 Other intervertebral disc degeneration, lumbosacral region: Secondary | ICD-10-CM | POA: Insufficient documentation

## 2014-01-23 DIAGNOSIS — C61 Malignant neoplasm of prostate: Secondary | ICD-10-CM | POA: Insufficient documentation

## 2014-01-23 DIAGNOSIS — M545 Low back pain, unspecified: Secondary | ICD-10-CM | POA: Insufficient documentation

## 2014-01-23 DIAGNOSIS — M47817 Spondylosis without myelopathy or radiculopathy, lumbosacral region: Secondary | ICD-10-CM | POA: Insufficient documentation

## 2014-01-23 DIAGNOSIS — Z9181 History of falling: Secondary | ICD-10-CM | POA: Insufficient documentation

## 2014-01-23 DIAGNOSIS — M51379 Other intervertebral disc degeneration, lumbosacral region without mention of lumbar back pain or lower extremity pain: Secondary | ICD-10-CM | POA: Insufficient documentation

## 2014-01-23 LAB — GLUCOSE, CAPILLARY
GLUCOSE-CAPILLARY: 101 mg/dL — AB (ref 70–99)
Glucose-Capillary: 244 mg/dL — ABNORMAL HIGH (ref 70–99)

## 2014-01-23 NOTE — Telephone Encounter (Signed)
Received call from patient's primary care physician's office, Dr Willey Blade - per Stephen Bates, states patient is at [facility] (had been discharged to there from [facility]), and that patient had mentioned back pain.  Beth states [place], called our office earlier today, and that she was advised that Dr Aline Brochure would not be able to treat the back pain.  States Dr Willey Blade in turn ordered Xray, and that the result is a fracture of T-12.  Please review and advise about appointment for office, based on patient's mobility and status; notes also, Dr Aline Brochure had recently seen patient while in-patient, for fracture of elbow.  Dr Ria Comment office # is [phone]; [facility] is [phone].

## 2014-01-24 LAB — GLUCOSE, CAPILLARY
Glucose-Capillary: 128 mg/dL — ABNORMAL HIGH (ref 70–99)
Glucose-Capillary: 257 mg/dL — ABNORMAL HIGH (ref 70–99)

## 2014-01-24 NOTE — Telephone Encounter (Signed)
Please refer the patient to Dr. Tora Perches (male ) for possible kyphoplasty

## 2014-01-24 NOTE — Telephone Encounter (Signed)
Routing to nurse per Dr Ruthe Mannan response regarding referral, and also to notify Dr. Ria Comment office, and [facility].

## 2014-01-25 DIAGNOSIS — G20 Parkinson's disease: Secondary | ICD-10-CM

## 2014-01-25 LAB — GLUCOSE, CAPILLARY
GLUCOSE-CAPILLARY: 122 mg/dL — AB (ref 70–99)
Glucose-Capillary: 225 mg/dL — ABNORMAL HIGH (ref 70–99)

## 2014-01-25 NOTE — Telephone Encounter (Signed)
DO WE NEED TO REFER PATIENT OR DR [place]

## 2014-01-26 LAB — GLUCOSE, CAPILLARY
Glucose-Capillary: 138 mg/dL — ABNORMAL HIGH (ref 70–99)
Glucose-Capillary: 186 mg/dL — ABNORMAL HIGH (ref 70–99)

## 2014-01-26 NOTE — Telephone Encounter (Signed)
EITHER ONE

## 2014-01-26 NOTE — Telephone Encounter (Signed)
Advised Beth at Dr Ria Comment office and she advised to let [facility] know to handle the referral Spoke to [place] and [facility] and advised to refer to Dr Tora Perches

## 2014-01-27 ENCOUNTER — Encounter (HOSPITAL_COMMUNITY): Payer: Self-pay

## 2014-01-27 ENCOUNTER — Ambulatory Visit (HOSPITAL_COMMUNITY)
Admit: 2014-01-27 | Discharge: 2014-01-27 | Disposition: A | Discharge status: Home / Self Care | Payer: Medicare Other | Source: Ambulatory Visit | Attending: Internal Medicine | Admitting: Internal Medicine

## 2014-01-27 DIAGNOSIS — I7090 Unspecified atherosclerosis: Secondary | ICD-10-CM | POA: Insufficient documentation

## 2014-01-27 DIAGNOSIS — M459 Ankylosing spondylitis of unspecified sites in spine: Secondary | ICD-10-CM | POA: Insufficient documentation

## 2014-01-27 DIAGNOSIS — M8448XA Pathological fracture, other site, initial encounter for fracture: Secondary | ICD-10-CM | POA: Insufficient documentation

## 2014-01-27 DIAGNOSIS — J189 Pneumonia, unspecified organism: Secondary | ICD-10-CM | POA: Insufficient documentation

## 2014-01-27 DIAGNOSIS — IMO0002 Reserved for concepts with insufficient information to code with codable children: Secondary | ICD-10-CM | POA: Diagnosis not present

## 2014-01-27 DIAGNOSIS — J90 Pleural effusion, not elsewhere classified: Secondary | ICD-10-CM | POA: Insufficient documentation

## 2014-01-27 LAB — GLUCOSE, CAPILLARY: GLUCOSE-CAPILLARY: 132 mg/dL — AB (ref 70–99)

## 2014-01-27 NOTE — Telephone Encounter (Signed)
I called daughter. Will maintain current carb/levo dosing for now. -VRP

## 2014-01-30 LAB — GLUCOSE, CAPILLARY
GLUCOSE-CAPILLARY: 119 mg/dL — AB (ref 70–99)
GLUCOSE-CAPILLARY: 195 mg/dL — AB (ref 70–99)
Glucose-Capillary: 193 mg/dL — ABNORMAL HIGH (ref 70–99)
Glucose-Capillary: 129 mg/dL — ABNORMAL HIGH (ref 70–99)
Glucose-Capillary: 251 mg/dL — ABNORMAL HIGH (ref 70–99)
Glucose-Capillary: 140 mg/dL — ABNORMAL HIGH (ref 70–99)

## 2014-01-31 LAB — GLUCOSE, CAPILLARY
Glucose-Capillary: 188 mg/dL — ABNORMAL HIGH (ref 70–99)
Glucose-Capillary: 121 mg/dL — ABNORMAL HIGH (ref 70–99)
Glucose-Capillary: 230 mg/dL — ABNORMAL HIGH (ref 70–99)

## 2014-02-01 ENCOUNTER — Other Ambulatory Visit: Payer: Self-pay | Admitting: Radiology

## 2014-02-01 ENCOUNTER — Other Ambulatory Visit: Payer: Self-pay | Admitting: *Deleted

## 2014-02-01 LAB — GLUCOSE, CAPILLARY: Glucose-Capillary: 103 mg/dL — ABNORMAL HIGH (ref 70–99)

## 2014-02-01 MED ORDER — HYDROCODONE-ACETAMINOPHEN 5-325 MG PO TABS: ORAL_TABLET | ORAL | Status: DC

## 2014-02-01 NOTE — Telephone Encounter (Signed)
[facility] 

## 2014-02-02 DIAGNOSIS — F411 Generalized anxiety disorder: Secondary | ICD-10-CM | POA: Diagnosis not present

## 2014-02-02 DIAGNOSIS — F0390 Unspecified dementia without behavioral disturbance: Secondary | ICD-10-CM | POA: Diagnosis not present

## 2014-02-02 DIAGNOSIS — F3289 Other specified depressive episodes: Secondary | ICD-10-CM | POA: Diagnosis not present

## 2014-02-02 DIAGNOSIS — F329 Major depressive disorder, single episode, unspecified: Secondary | ICD-10-CM | POA: Diagnosis not present

## 2014-02-02 LAB — GLUCOSE, CAPILLARY
GLUCOSE-CAPILLARY: 202 mg/dL — AB (ref 70–99)
Glucose-Capillary: 119 mg/dL — ABNORMAL HIGH (ref 70–99)

## 2014-02-03 ENCOUNTER — Ambulatory Visit (HOSPITAL_COMMUNITY)
Admit: 2014-02-03 | Discharge: 2014-02-03 | Disposition: A | Discharge status: Home / Self Care | Payer: Medicare Other | Source: Skilled Nursing Facility | Attending: Interventional Radiology | Admitting: Interventional Radiology

## 2014-02-03 ENCOUNTER — Encounter (HOSPITAL_COMMUNITY): Payer: Self-pay

## 2014-02-03 VITALS — BP 145/54 | HR 71 | Temp 98.7°F | Resp 20 | Ht 64.0 in | Wt 123.0 lb

## 2014-02-03 DIAGNOSIS — S22009A Unspecified fracture of unspecified thoracic vertebra, initial encounter for closed fracture: Secondary | ICD-10-CM | POA: Diagnosis not present

## 2014-02-03 DIAGNOSIS — Z8546 Personal history of malignant neoplasm of prostate: Secondary | ICD-10-CM | POA: Insufficient documentation

## 2014-02-03 DIAGNOSIS — F0390 Unspecified dementia without behavioral disturbance: Secondary | ICD-10-CM | POA: Insufficient documentation

## 2014-02-03 DIAGNOSIS — E119 Type 2 diabetes mellitus without complications: Secondary | ICD-10-CM | POA: Insufficient documentation

## 2014-02-03 DIAGNOSIS — S22089A Unspecified fracture of T11-T12 vertebra, initial encounter for closed fracture: Secondary | ICD-10-CM

## 2014-02-03 DIAGNOSIS — Z8673 Personal history of transient ischemic attack (TIA), and cerebral infarction without residual deficits: Secondary | ICD-10-CM | POA: Insufficient documentation

## 2014-02-03 DIAGNOSIS — J189 Pneumonia, unspecified organism: Secondary | ICD-10-CM | POA: Insufficient documentation

## 2014-02-03 DIAGNOSIS — I10 Essential (primary) hypertension: Secondary | ICD-10-CM | POA: Insufficient documentation

## 2014-02-03 DIAGNOSIS — IMO0002 Reserved for concepts with insufficient information to code with codable children: Secondary | ICD-10-CM

## 2014-02-03 DIAGNOSIS — W19XXXA Unspecified fall, initial encounter: Secondary | ICD-10-CM | POA: Insufficient documentation

## 2014-02-03 DIAGNOSIS — M8448XA Pathological fracture, other site, initial encounter for fracture: Secondary | ICD-10-CM | POA: Diagnosis not present

## 2014-02-03 DIAGNOSIS — M546 Pain in thoracic spine: Secondary | ICD-10-CM

## 2014-02-03 DIAGNOSIS — Z87891 Personal history of nicotine dependence: Secondary | ICD-10-CM | POA: Insufficient documentation

## 2014-02-03 LAB — CBC WITH DIFFERENTIAL/PLATELET
BASOS ABS: 0 10*3/uL (ref 0.0–0.1)
Basophils Relative: 0 % (ref 0–1)
EOS ABS: 0.2 10*3/uL (ref 0.0–0.7)
Eosinophils Relative: 2 % (ref 0–5)
HCT: 38.7 % — ABNORMAL LOW (ref 39.0–52.0)
Hemoglobin: 12.7 g/dL — ABNORMAL LOW (ref 13.0–17.0)
LYMPHS ABS: 1.2 10*3/uL (ref 0.7–4.0)
Lymphocytes Relative: 11 % — ABNORMAL LOW (ref 12–46)
MCH: 35.6 pg — AB (ref 26.0–34.0)
MCHC: 32.8 g/dL (ref 30.0–36.0)
MCV: 108.4 fL — AB (ref 78.0–100.0)
Monocytes Absolute: 0.9 10*3/uL (ref 0.1–1.0)
Monocytes Relative: 8 % (ref 3–12)
NEUTROS ABS: 8.4 10*3/uL — AB (ref 1.7–7.7)
Neutrophils Relative %: 79 % — ABNORMAL HIGH (ref 43–77)
Platelets: 277 10*3/uL (ref 150–400)
RBC: 3.57 MIL/uL — ABNORMAL LOW (ref 4.22–5.81)
RDW: 12.8 % (ref 11.5–15.5)
WBC: 10.7 10*3/uL — ABNORMAL HIGH (ref 4.0–10.5)

## 2014-02-03 LAB — BASIC METABOLIC PANEL
Anion gap: 14 (ref 5–15)
BUN: 12 mg/dL (ref 6–23)
CALCIUM: 9.4 mg/dL (ref 8.4–10.5)
CO2: 28 mEq/L (ref 19–32)
CREATININE: 0.62 mg/dL (ref 0.50–1.35)
Chloride: 100 mEq/L (ref 96–112)
GFR calc Af Amer: >90 mL/min (ref >90)
GFR calc non Af Amer: 88 mL/min — ABNORMAL LOW (ref >90)
Glucose, Bld: 168 mg/dL — ABNORMAL HIGH (ref 70–99)
Potassium: 3.5 mEq/L — ABNORMAL LOW (ref 3.7–5.3)
Sodium: 142 mEq/L (ref 137–147)

## 2014-02-03 LAB — GLUCOSE, CAPILLARY
GLUCOSE-CAPILLARY: 125 mg/dL — AB (ref 70–99)
GLUCOSE-CAPILLARY: 158 mg/dL — AB (ref 70–99)
Glucose-Capillary: 176 mg/dL — ABNORMAL HIGH (ref 70–99)
Glucose-Capillary: 145 mg/dL — ABNORMAL HIGH (ref 70–99)

## 2014-02-03 LAB — PROTIME-INR
INR: 0.99 (ref 0.00–1.49)
Prothrombin Time: 13.1 seconds (ref 11.6–15.2)

## 2014-02-03 LAB — APTT: APTT: 21 s — AB (ref 24–37)

## 2014-02-03 MED ORDER — IOHEXOL 300 MG/ML  SOLN
50.0000 mL | Freq: Once | INTRAMUSCULAR | Status: AC | PRN
  Administered 2014-02-03: 10 mL via INTRAVENOUS

## 2014-02-03 MED ORDER — CEFAZOLIN SODIUM-DEXTROSE 2-3 GM-% IV SOLR
INTRAVENOUS | Status: AC
  Filled 2014-02-03: qty 50

## 2014-02-03 MED ORDER — SODIUM CHLORIDE 0.9 % IV SOLN
INTRAVENOUS | Status: DC
  Administered 2014-02-03: 08:00:00 via INTRAVENOUS

## 2014-02-03 MED ORDER — GELATIN ABSORBABLE 12-7 MM EX MISC
CUTANEOUS | Status: AC
  Filled 2014-02-03: qty 1

## 2014-02-03 MED ORDER — HYDRALAZINE HCL 20 MG/ML IJ SOLN
5.0000 mg | Freq: Once | INTRAMUSCULAR | Status: AC
  Administered 2014-02-03: 5 mg via INTRAVENOUS

## 2014-02-03 MED ORDER — NALOXONE HCL 0.4 MG/ML IJ SOLN
INTRAMUSCULAR | Status: AC
  Filled 2014-02-03: qty 1

## 2014-02-03 MED ORDER — HYDRALAZINE HCL 20 MG/ML IJ SOLN
INTRAMUSCULAR | Status: AC
  Filled 2014-02-03: qty 1

## 2014-02-03 MED ORDER — HYDRALAZINE HCL 20 MG/ML IJ SOLN
2.5000 mg | Freq: Once | INTRAMUSCULAR | Status: AC
  Administered 2014-02-03: 2.5 mg via INTRAVENOUS

## 2014-02-03 MED ORDER — MIDAZOLAM HCL 2 MG/2ML IJ SOLN
INTRAMUSCULAR | Status: AC | PRN
  Administered 2014-02-03: 1 mg via INTRAVENOUS

## 2014-02-03 MED ORDER — FENTANYL CITRATE 0.05 MG/ML IJ SOLN
INTRAMUSCULAR | Status: AC
  Filled 2014-02-03: qty 2

## 2014-02-03 MED ORDER — CEFAZOLIN SODIUM-DEXTROSE 2-3 GM-% IV SOLR
2.0000 g | INTRAVENOUS | Status: AC
  Administered 2014-02-03: 2 g via INTRAVENOUS

## 2014-02-03 MED ORDER — FENTANYL CITRATE 0.05 MG/ML IJ SOLN
INTRAMUSCULAR | Status: AC | PRN
  Administered 2014-02-03: 25 ug via INTRAVENOUS
  Administered 2014-02-03: 12.5 ug via INTRAVENOUS

## 2014-02-03 MED ORDER — FLUMAZENIL 0.5 MG/5ML IV SOLN
INTRAVENOUS | Status: AC
  Filled 2014-02-03: qty 5

## 2014-02-03 MED ORDER — SODIUM CHLORIDE 0.9 % IV SOLN: INTRAVENOUS | Status: AC

## 2014-02-03 MED ORDER — MIDAZOLAM HCL 2 MG/2ML IJ SOLN
INTRAMUSCULAR | Status: AC
  Filled 2014-02-03: qty 4

## 2014-02-03 MED ORDER — TOBRAMYCIN SULFATE 1.2 G IJ SOLR
INTRAMUSCULAR | Status: AC
  Filled 2014-02-03: qty 1.2

## 2014-02-03 NOTE — Discharge Instructions (Signed)
KYPHOPLASTY/VERTEBROPLASTY DISCHARGE INSTRUCTIONS  Medications: (check all that apply)     Resume all home medications as before procedure.       Resume your (aspirin/Plavix/Coumadin) per Dr Estanislado Pandy.                  Continue your pain medications as prescribed as needed.  Over the next 3-5 days, decrease your pain medication as tolerated.  Over the counter medications (i.e. Tylenol, ibuprofen, and aleve) may be substituted once severe/moderate pain symptoms have subsided.   Wound Care:   Bandages may be removed the day following your procedure.  You may get your incision wet once bandages are removed.  Bandaids may be used to cover the incisions until scab formation.  Topical ointments are optional.    If you develop a fever greater than 101 degrees, have increased skin redness at the incision sites or pus-like oozing from incisions occurring within 1 week of the procedure, contact radiology at [phone] or [phone].    Ice pack to back for 15-20 minutes 2-3 time per day for first 2-3 days post procedure.  The ice will expedite muscle healing and help with the pain from the incisions.   Activity:   Bedrest today with limited activity for 24 hours post procedure.    No driving for 48 hours.    Increase your activity as tolerated after bedrest (with assistance if necessary).    Refrain from any strenuous activity or heavy lifting (greater than 10 lbs.).   Follow up:   Contact radiology at [phone] or [phone] if any questions/concerns.    A physician assistant from radiology will contact you in approximately 1 week.    If a biopsy was performed at the time of your procedure, your referring physician should receive the results in usually 2-3 days.

## 2014-02-03 NOTE — Procedures (Signed)
S/P T11 and T 12  VPs

## 2014-02-03 NOTE — H&P (Signed)
Chief Complaint: "I'm here for my back fractures Referring Physician:[place] HPI: Stephen Bates is an 80 y.o. male resident at [facility] who developed back pain after a fall. He was found to have T11 and T12 compression fractures. He also had left sided PNA at that time and has been treated with po abx for the past 10 days. He denies fever, chills, SOB. He has occasional non productive cough. His back pain is being treated with po pain meds but only provide him minimal relief. He has been referred for VP/KP of these fractures. PMHx, meds, labs reviewed. Has been NPO this am.  Past Medical History:  Past Medical History  Diagnosis Date  . Hypertension   . Prostate cancer   . Weakness of both legs   . Stroke 1982  . Wears glasses   . Arthritis   . Benign neoplasm of colon   . History of right bundle branch block   . Diabetes   . Mild dementia   . Neuropathy   . Frequent falls 2012  . Chronic back pain   . Lumbar radiculopathy     Past Surgical History:  Past Surgical History  Procedure Laterality Date  . Eye surgery  2010    both cataracts  . Colonoscopy    . Insertion prostate radiation seed  2001  . Mohs surgery  12/12/2008    Family History:  Family History  Problem Relation Age of Onset  . Colon cancer Sister   . Bladder Cancer Father   . Bronchopulmonary dysplasia Mother     Social History:  reports that he quit smoking about 44 years ago. He has never used smokeless tobacco. He reports that he drinks alcohol. He reports that he does not use illicit drugs.  Allergies: No Known Allergies  Medications: See Med Rec Currently on po Levaquin 500mg  daily. No anticoagulants  Please HPI for pertinent positives, otherwise complete 10 system ROS negative.  Physical Exam: BP 164/75  Pulse 110  Temp(Src) 97.9 F (36.6 C) (Oral)  Resp 18  Ht 5\' 4"  (1.626 m)  Wt 123 lb (55.792 kg)  BMI 21.10 kg/m2  SpO2 96% Body mass index is 21.1 kg/(m^2).   General  Appearance:  Alert, cooperative, no distress, appears stated age  Head:  Normocephalic, without obvious abnormality, atraumatic  ENT: Unremarkable  Neck: Supple, symmetrical, trachea midline  Lungs:   Clear to auscultation bilaterally, no w/r/r, respirations unlabored without use of accessory muscles.  Chest Wall:  No tenderness or deformity  Heart:  Regular rate and rhythm, S1, S2 normal, no murmur, rub or gallop.  Neurologic: Normal affect, no gross deficits.   Results for orders placed during the hospital encounter of 01/07/14 (from the past 48 hour(s))  GLUCOSE, CAPILLARY     Status: Abnormal   Collection Time    02/01/14  3:52 PM      Result Value Ref Range   Glucose-Capillary 202 (*) 70 - 99 mg/dL  GLUCOSE, CAPILLARY     Status: Abnormal   Collection Time    02/02/14  5:43 AM      Result Value Ref Range   Glucose-Capillary 119 (*) 70 - 99 mg/dL   Comment 1 Documented in Chart    GLUCOSE, CAPILLARY     Status: Abnormal   Collection Time    02/03/14  5:01 AM      Result Value Ref Range   Glucose-Capillary 125 (*) 70 - 99 mg/dL   CBC    Component  Value Date/Time   WBC PENDING 02/03/2014 0707   RBC 3.57* 02/03/2014 0707   HGB 12.7* 02/03/2014 0707   HCT 38.7* 02/03/2014 0707   PLT PENDING 02/03/2014 0707   MCV 108.4* 02/03/2014 0707   MCH 35.6* 02/03/2014 0707   MCHC 32.8 02/03/2014 0707   RDW 12.8 02/03/2014 0707   LYMPHSABS PENDING 02/03/2014 0707   MONOABS PENDING 02/03/2014 0707   EOSABS PENDING 02/03/2014 0707   BASOSABS PENDING 02/03/2014 0707    BMET    Component Value Date/Time   NA 142 02/03/2014 0707   K 3.5* 02/03/2014 0707   CL 100 02/03/2014 0707   CO2 28 02/03/2014 0707   GLUCOSE 168* 02/03/2014 0707   BUN 12 02/03/2014 0707   CREATININE 0.62 02/03/2014 0707   CALCIUM 9.4 02/03/2014 0707   GFRNONAA 88* 02/03/2014 0707   GFRAA >90 02/03/2014 0707    INR/Prothrombin Time 13.1/0.99  Assessment/Plan T11 and T12 compression fractures For VP/KP procedure  today. Explained procedure, risks, complications, use of sedation. Labs reviewed, awaiting WBC Resolving PNA, asymptomatic and afebrile, has been on po abx X ~10 days Dr. Estanislado Pandy to review. Consent signed in chart  [name] PA-C 02/03/2014, 8:03 AM

## 2014-02-04 LAB — GLUCOSE, CAPILLARY
Glucose-Capillary: 133 mg/dL — ABNORMAL HIGH (ref 70–99)
Glucose-Capillary: 222 mg/dL — ABNORMAL HIGH (ref 70–99)

## 2014-02-05 LAB — GLUCOSE, CAPILLARY
GLUCOSE-CAPILLARY: 194 mg/dL — AB (ref 70–99)
Glucose-Capillary: 150 mg/dL — ABNORMAL HIGH (ref 70–99)

## 2014-02-06 LAB — GLUCOSE, CAPILLARY: GLUCOSE-CAPILLARY: 136 mg/dL — AB (ref 70–99)

## 2014-02-07 LAB — GLUCOSE, CAPILLARY
GLUCOSE-CAPILLARY: 179 mg/dL — AB (ref 70–99)
Glucose-Capillary: 149 mg/dL — ABNORMAL HIGH (ref 70–99)
Glucose-Capillary: 163 mg/dL — ABNORMAL HIGH (ref 70–99)

## 2014-02-08 LAB — GLUCOSE, CAPILLARY: Glucose-Capillary: 140 mg/dL — ABNORMAL HIGH (ref 70–99)

## 2014-02-09 LAB — GLUCOSE, CAPILLARY
Glucose-Capillary: 155 mg/dL — ABNORMAL HIGH (ref 70–99)
Glucose-Capillary: 137 mg/dL — ABNORMAL HIGH (ref 70–99)
Glucose-Capillary: 168 mg/dL — ABNORMAL HIGH (ref 70–99)

## 2014-02-10 LAB — GLUCOSE, CAPILLARY
Glucose-Capillary: 121 mg/dL — ABNORMAL HIGH (ref 70–99)
Glucose-Capillary: 170 mg/dL — ABNORMAL HIGH (ref 70–99)

## 2014-02-11 LAB — GLUCOSE, CAPILLARY: GLUCOSE-CAPILLARY: 125 mg/dL — AB (ref 70–99)

## 2014-02-12 LAB — GLUCOSE, CAPILLARY
GLUCOSE-CAPILLARY: 168 mg/dL — AB (ref 70–99)
Glucose-Capillary: 146 mg/dL — ABNORMAL HIGH (ref 70–99)
Glucose-Capillary: 195 mg/dL — ABNORMAL HIGH (ref 70–99)

## 2014-02-13 LAB — GLUCOSE, CAPILLARY
GLUCOSE-CAPILLARY: 226 mg/dL — AB (ref 70–99)
Glucose-Capillary: 166 mg/dL — ABNORMAL HIGH (ref 70–99)

## 2014-02-14 LAB — GLUCOSE, CAPILLARY: Glucose-Capillary: 125 mg/dL — ABNORMAL HIGH (ref 70–99)

## 2014-02-15 LAB — GLUCOSE, CAPILLARY
GLUCOSE-CAPILLARY: 167 mg/dL — AB (ref 70–99)
GLUCOSE-CAPILLARY: 169 mg/dL — AB (ref 70–99)
Glucose-Capillary: 106 mg/dL — ABNORMAL HIGH (ref 70–99)

## 2014-02-16 LAB — GLUCOSE, CAPILLARY
Glucose-Capillary: 110 mg/dL — ABNORMAL HIGH (ref 70–99)
Glucose-Capillary: 149 mg/dL — ABNORMAL HIGH (ref 70–99)

## 2014-02-17 LAB — GLUCOSE, CAPILLARY
Glucose-Capillary: 118 mg/dL — ABNORMAL HIGH (ref 70–99)
Glucose-Capillary: 212 mg/dL — ABNORMAL HIGH (ref 70–99)

## 2014-02-18 LAB — GLUCOSE, CAPILLARY
GLUCOSE-CAPILLARY: 166 mg/dL — AB (ref 70–99)
Glucose-Capillary: 122 mg/dL — ABNORMAL HIGH (ref 70–99)

## 2014-02-19 LAB — GLUCOSE, CAPILLARY: Glucose-Capillary: 121 mg/dL — ABNORMAL HIGH (ref 70–99)

## 2014-02-20 ENCOUNTER — Ambulatory Visit (HOSPITAL_COMMUNITY)
Admit: 2014-02-20 | Discharge: 2014-02-20 | Disposition: A | Discharge status: Home / Self Care | Payer: Medicare Other | Attending: Interventional Radiology | Admitting: Interventional Radiology

## 2014-02-20 DIAGNOSIS — Z9889 Other specified postprocedural states: Secondary | ICD-10-CM | POA: Diagnosis not present

## 2014-02-20 DIAGNOSIS — Z09 Encounter for follow-up examination after completed treatment for conditions other than malignant neoplasm: Secondary | ICD-10-CM | POA: Diagnosis not present

## 2014-02-20 LAB — GLUCOSE, CAPILLARY
Glucose-Capillary: 200 mg/dL — ABNORMAL HIGH (ref 70–99)
Glucose-Capillary: 116 mg/dL — ABNORMAL HIGH (ref 70–99)

## 2014-02-21 LAB — GLUCOSE, CAPILLARY
GLUCOSE-CAPILLARY: 161 mg/dL — AB (ref 70–99)
GLUCOSE-CAPILLARY: 158 mg/dL — AB (ref 70–99)
Glucose-Capillary: 112 mg/dL — ABNORMAL HIGH (ref 70–99)

## 2014-02-22 DIAGNOSIS — IMO0002 Reserved for concepts with insufficient information to code with codable children: Secondary | ICD-10-CM | POA: Diagnosis not present

## 2014-02-22 LAB — GLUCOSE, CAPILLARY: Glucose-Capillary: 127 mg/dL — ABNORMAL HIGH (ref 70–99)

## 2014-02-22 NOTE — Progress Notes (Signed)
NAMETIRRELL, BUCHBERGER NO.:  [id number]  MEDICAL RECORD NO.:  [id number]  LOCATION:  XRAY                         FACILITY:  [facility]  PHYSICIAN:  Paula Compton. Willey Blade, MD       DATE OF BIRTH:  Mar 03, 1927  DATE OF PROCEDURE:  02/22/2014 DATE OF DISCHARGE:  02/20/2014                                PROGRESS NOTE   SUBJECTIVE:  Mr. Poole was re-evaluated today at the nursing home. He is making progress with Physical Therapy but is still having difficulty walking.  He is sitting in a wheelchair at the time of my visit.  He is not having problems with his bowels now.  He is well pleased with his response to taking a stool softener daily and a dose of MiraLax daily.  OBJECTIVE:  HEENT:  His head wound has healed well.  Pharynx is moist. LUNGS:  Clear. HEART:  Regular with no murmurs. ABDOMEN:  Soft and nontender. EXTREMITIES:  No edema.  He has multiple purpuras.  He has an OpSite on an abrasion type wound on his left arm and on his right lower leg.  IMPRESSION/PLAN: 1. Multiple falls.  Parkinson disease.  Continue Sinemet 25/100 two     tablets t.i.d.  He will have followup with Neurology next month.     Continue working with Physical Therapy. 2. Constipation, resolved.  Continue Colace and MiraLax. 3. Loss of appetite improved.  Continue mirtazapine 30 mg each night. 4. Hypertension and history of stroke.  Continue benazepril and     aspirin.     Paula Compton. Willey Blade, MD     ROF/MEDQ  D:  02/22/2014  T:  02/22/2014  Job:  [id number]

## 2014-02-23 LAB — GLUCOSE, CAPILLARY
Glucose-Capillary: 213 mg/dL — ABNORMAL HIGH (ref 70–99)
Glucose-Capillary: 125 mg/dL — ABNORMAL HIGH (ref 70–99)
Glucose-Capillary: 168 mg/dL — ABNORMAL HIGH (ref 70–99)

## 2014-02-24 LAB — GLUCOSE, CAPILLARY
Glucose-Capillary: 118 mg/dL — ABNORMAL HIGH (ref 70–99)
Glucose-Capillary: 184 mg/dL — ABNORMAL HIGH (ref 70–99)

## 2014-02-25 LAB — GLUCOSE, CAPILLARY
GLUCOSE-CAPILLARY: 192 mg/dL — AB (ref 70–99)
Glucose-Capillary: 138 mg/dL — ABNORMAL HIGH (ref 70–99)

## 2014-02-26 LAB — GLUCOSE, CAPILLARY
Glucose-Capillary: 155 mg/dL — ABNORMAL HIGH (ref 70–99)
Glucose-Capillary: 223 mg/dL — ABNORMAL HIGH (ref 70–99)

## 2014-02-27 ENCOUNTER — Telehealth: Payer: Self-pay | Admitting: *Deleted

## 2014-02-27 ENCOUNTER — Telehealth: Payer: Self-pay | Admitting: Diagnostic Neuroimaging

## 2014-02-27 LAB — GLUCOSE, CAPILLARY: Glucose-Capillary: 171 mg/dL — ABNORMAL HIGH (ref 70–99)

## 2014-02-27 NOTE — Telephone Encounter (Signed)
Patient daughter called stating her father had several bad falls but the last fall happen 6 weeks ago and he was admitted to [facility]. Patient has been at the [facility] for rehab and will be release today. Patient's daughter would like a recommendation for her father to see a specialist in Parkinson's disease

## 2014-02-27 NOTE — Telephone Encounter (Signed)
Spoke to daughter. She is requesting an earlier appt because she feels that the patient has not improved at all. She says he is at the [facility] and is receiving PT, but does not know if patient is receiving the correct care there. She is requesting to have patient complete PT at [facility] because she saw that they specialize in Parkinsonism and movement disorders also. Scheduled patient for follow up appt w/ Dr. Leta Baptist this week.

## 2014-02-27 NOTE — Telephone Encounter (Signed)
Spoke to patient scheduled appt for this week.

## 2014-02-28 LAB — GLUCOSE, CAPILLARY
GLUCOSE-CAPILLARY: 176 mg/dL — AB (ref 70–99)
GLUCOSE-CAPILLARY: 236 mg/dL — AB (ref 70–99)
Glucose-Capillary: 140 mg/dL — ABNORMAL HIGH (ref 70–99)

## 2014-03-01 LAB — GLUCOSE, CAPILLARY: Glucose-Capillary: 167 mg/dL — ABNORMAL HIGH (ref 70–99)

## 2014-03-02 ENCOUNTER — Encounter: Payer: Self-pay | Admitting: Diagnostic Neuroimaging

## 2014-03-02 ENCOUNTER — Ambulatory Visit (INDEPENDENT_AMBULATORY_CARE_PROVIDER_SITE_OTHER): Payer: Medicare Other | Admitting: Diagnostic Neuroimaging

## 2014-03-02 VITALS — BP 166/89 | HR 92 | Ht 64.0 in | Wt 122.2 lb

## 2014-03-02 DIAGNOSIS — F03A0 Unspecified dementia, mild, without behavioral disturbance, psychotic disturbance, mood disturbance, and anxiety: Secondary | ICD-10-CM

## 2014-03-02 DIAGNOSIS — F0390 Unspecified dementia without behavioral disturbance: Secondary | ICD-10-CM | POA: Diagnosis not present

## 2014-03-02 DIAGNOSIS — R269 Unspecified abnormalities of gait and mobility: Secondary | ICD-10-CM

## 2014-03-02 DIAGNOSIS — G20A1 Parkinson's disease without dyskinesia, without mention of fluctuations: Secondary | ICD-10-CM

## 2014-03-02 DIAGNOSIS — R5381 Other malaise: Secondary | ICD-10-CM

## 2014-03-02 DIAGNOSIS — G20 Parkinson's disease: Secondary | ICD-10-CM | POA: Diagnosis not present

## 2014-03-02 LAB — GLUCOSE, CAPILLARY
Glucose-Capillary: 208 mg/dL — ABNORMAL HIGH (ref 70–99)
Glucose-Capillary: 144 mg/dL — ABNORMAL HIGH (ref 70–99)
Glucose-Capillary: 170 mg/dL — ABNORMAL HIGH (ref 70–99)

## 2014-03-02 NOTE — Progress Notes (Signed)
[facility]  PATIENT: Stephen Bates DOB: [birth year]  REFERRING CLINICIAN: R Fagan HISTORY FROM: patient, wife, daughter REASON FOR VISIT: follow up   HISTORICAL  CHIEF COMPLAINT:  Chief Complaint  Patient presents with  . Parkinson's Disease    no improvement in sx, Rm 7    HISTORY OF PRESENT ILLNESS:   UPDATE 03/02/14: Since last visit, had multiple falls, ER visits, then admitted for PNA. Also had vertebroplasty for T11 and T12 compression fractures (painful). Tolerating carb/levo. Tremor is under control.   UPDATE 11/25/13: Since last visit, continues to have gait and balance problems. Not much benefit in gait with carb/levo, although tremor seems to be better. Drooling is improved.  PRIOR HPI (10/18/13): 80 year old right-handed male with hypertension, diabetes, depression, anxiety, prostate cancer, here for evaluation of gait and balance difficulty. 2012 patient was able to walk on an outdoor trail 2 miles in 45 minutes. Since that, he gradually had gait and balance difficulty. He fell in 2012 later that year, onto his bottom with significant pain in his buttocks and legs. Patient gradually had some improvement in pain but his balance and walking never covered. Patient is also has intermittent tremor in his arms as well as intermittent freezing of gait. No memory problems according to patient her family. No significant swallowing problems. He has had increasing drooling and carries a napkin with him.   REVIEW OF SYSTEMS: Full 14 system review of systems performed and notable weakness depression cough urinary urgency.   ALLERGIES: No Known Allergies  HOME MEDICATIONS: Outpatient Prescriptions Prior to Visit  Medication Sig Dispense Refill  . acetaminophen (TYLENOL) 325 MG tablet Take 650 mg by mouth every 4 (four) hours as needed for moderate pain.      . Amino Acids-Protein Hydrolys (FEEDING SUPPLEMENT, PRO-STAT SUGAR FREE 64,) LIQD Take 30 mLs by  mouth every morning.      Marland Kitchen aspirin EC 81 MG tablet Take 81 mg by mouth daily.      . benazepril (LOTENSIN) 40 MG tablet Take 1 tablet by mouth every morning.       . carbidopa-levodopa (SINEMET IR) 25-100 MG per tablet Take 2 tablets by mouth 3 (three) times daily.  180 tablet  12  . feeding supplement, GLUCERNA SHAKE, (GLUCERNA SHAKE) LIQD Take 237 mLs by mouth at bedtime.      Marland Kitchen HYDROcodone-acetaminophen (NORCO/VICODIN) 5-325 MG per tablet Take 1 tablet by mouth every 6 (six) hours as needed for moderate pain.      . magnesium hydroxide (MILK OF MAGNESIA) 400 MG/5ML suspension Take 15 mLs by mouth daily as needed for mild constipation.      . mirtazapine (REMERON) 30 MG tablet Take 30 mg by mouth at bedtime.      . Multiple Vitamins-Minerals (CENTRUM SILVER ADULT 50+ PO) Take 1 tablet by mouth every morning.      . ondansetron (ZOFRAN) 4 MG tablet Take 4 mg by mouth every 8 (eight) hours as needed for nausea or vomiting.      . traMADol (ULTRAM) 50 MG tablet Take 50 mg by mouth 3 (three) times daily as needed for moderate pain.      . vitamin B-12 (CYANOCOBALAMIN) 1000 MCG tablet Take 1,000 mcg by mouth every morning.       No facility-administered medications prior to visit.    PAST MEDICAL HISTORY: Past Medical History  Diagnosis Date  . Hypertension   . Prostate cancer   . Weakness of both legs   .  Stroke 1982  . Wears glasses   . Arthritis   . Benign neoplasm of colon   . History of right bundle branch block   . Diabetes   . Mild dementia   . Neuropathy   . Frequent falls 2012  . Chronic back pain   . Lumbar radiculopathy     PAST SURGICAL HISTORY: Past Surgical History  Procedure Laterality Date  . Eye surgery  2010    both cataracts  . Colonoscopy    . Insertion prostate radiation seed  2001  . Mohs surgery  12/12/2008    FAMILY HISTORY: Family History  Problem Relation Age of Onset  . Colon cancer Sister   . Bladder Cancer Father   . Bronchopulmonary  dysplasia Mother     SOCIAL HISTORY:  History   Social History  . Marital Status: Married    Spouse Name: [name]    Number of Children: 2  . Years of Education: HS   Occupational History  . Retired    Social History Main Topics  . Smoking status: Former Smoker    Quit date: 06/08/1969  . Smokeless tobacco: Never Used  . Alcohol Use: Yes     Comment: daily x2 now  . Drug Use: No  . Sexual Activity: Not on file   Other Topics Concern  . Not on file   Social History Narrative   Patient lives at home with his spouse.   Caffeine Use: very rarely     PHYSICAL EXAM  Filed Vitals:   03/02/14 1321  BP: 166/89  Pulse: 92  Height: 5\' 4"  (1.626 m)  Weight: 122 lb 3.2 oz (55.43 kg)    Not recorded    Body mass index is 20.97 kg/(m^2).  GENERAL EXAM: Patient is in no distress; well developed, nourished and groomed; neck is supple; MASKED FACIES. MULTIPLE HEALING LACERATIONS, BRUISING, ECCHYMOSES ON SCALP, ARMS AND LEGS. THIN LEGS, CACHEXIA.    CARDIOVASCULAR: Regular rate and rhythm, no murmurs, no carotid bruits  NEUROLOGIC: MENTAL STATUS: awake, alert, language fluent, comprehension intact, naming intact, fund of knowledge appropriate; POSITIVE SNOUT, MYERSONS AND PALMOMENTAL REFLEXES. CRANIAL NERVE: pupils equal and reactive to light, visual fields full to confrontation, extraocular muscles intact, no nystagmus, facial sensation and strength symmetric, hearing intact, palate elevates symmetrically, uvula midline, shoulder shrug symmetric, tongue midline. MOTOR: normal bulk; NORMAL TONE IN BUE; NO REST TREMOR. NO POSTURAL TREMOR. BRADYKINESIA IN LEFT > RIGHT SIDE (UPPER AND LOWER EXT). Full strength in the BUE; BLE 4/5. SENSORY: normal and symmetric to light touch, temperature; VIB < 5 SEC AT TOES.  COORDINATION: finger-nose-finger SLOW. FOOT TAPPING SLOW.  REFLEXES: deep tendon reflexes TRACE and symmetric; ABSENT AT ANKLES GAIT/STATION: SIG DIFF STANDING UP EVEN WITH 2  PERSON ASSITANCE. NOT ABLE TO TAKE ANY STEPS. VERY UNSTEADY.     DIAGNOSTIC DATA (LABS, IMAGING, TESTING) - I reviewed patient records, labs, notes, testing and imaging myself where available.  Lab Results  Component Value Date   WBC 10.7* 02/03/2014   HGB 12.7* 02/03/2014   HCT 38.7* 02/03/2014   MCV 108.4* 02/03/2014   PLT 277 02/03/2014      Component Value Date/Time   NA 142 02/03/2014 0707   K 3.5* 02/03/2014 0707   CL 100 02/03/2014 0707   CO2 28 02/03/2014 0707   GLUCOSE 168* 02/03/2014 0707   BUN 12 02/03/2014 0707   CREATININE 0.62 02/03/2014 0707   CALCIUM 9.4 02/03/2014 0707   PROT 7.4 01/03/2014 2015  ALBUMIN 3.5 01/03/2014 2015   AST 22 01/03/2014 2015   ALT <5 01/03/2014 2015   ALKPHOS 61 01/03/2014 2015   BILITOT 1.2 01/03/2014 2015   GFRNONAA 88* 02/03/2014 0707   GFRAA >90 02/03/2014 0707   No results found for this basename: CHOL,  HDL,  LDLCALC,  LDLDIRECT,  TRIG,  CHOLHDL   No results found for this basename: HGBA1C   No results found for this basename: VITAMINB12   No results found for this basename: TSH    I reviewed images myself and agree with interpretation; in addition there is moderate ventriculomegaly on ex vacuo basis, with mod-severe temporal atrophy. Moderate-severe chronic small vessel ischemic disease.  -VRP  12/10/12 CT HEAD 1. No acute intracranial abnormality. Atrophy and chronic ischemic white matter disease. Right cerebellar lacunar infarcts are likely chronic.  2. Posterior scalp hematoma and closure without underlying skull fracture.   ASSESSMENT AND PLAN  80 y.o. year old male here with progressive gait difficulty, memory loss, drooling, balance problems and falling since 2012. Suspect an underlying neurodegenerative condition, superimposed on additional factors. Unfortunately, he is in a difficult position and we most likely cannot reverse his functional decline. Currently in SNF. He is not able to walk unassisted at this time, but working  towards that goal with PT. He would like to transition back home, which is a valiant goal, but going to be quite challenging.   Dx: neurodegenerative dz (ddx: parkinson's disease, parkinson's plus syndrome, dementia with lewy bodies, vascular dementia) + diabetic neuropathy + lumbar radiculopathy + deconditioning  PLAN: - continue carb/levo 2 tabs TID - continue PT  Return in about 6 months (around 09/02/2014).    Penni Bombard, MD 8/46/9629, 5:28 PM Certified in Neurology, Neurophysiology and Neuroimaging  [facility] [address], [place], [place] [address] [phone]

## 2014-03-03 LAB — GLUCOSE, CAPILLARY
GLUCOSE-CAPILLARY: 128 mg/dL — AB (ref 70–99)
GLUCOSE-CAPILLARY: 219 mg/dL — AB (ref 70–99)

## 2014-03-04 LAB — GLUCOSE, CAPILLARY
GLUCOSE-CAPILLARY: 153 mg/dL — AB (ref 70–99)
Glucose-Capillary: 225 mg/dL — ABNORMAL HIGH (ref 70–99)

## 2014-03-05 LAB — GLUCOSE, CAPILLARY
GLUCOSE-CAPILLARY: 132 mg/dL — AB (ref 70–99)
GLUCOSE-CAPILLARY: 215 mg/dL — AB (ref 70–99)

## 2014-03-06 LAB — GLUCOSE, CAPILLARY
GLUCOSE-CAPILLARY: 116 mg/dL — AB (ref 70–99)
Glucose-Capillary: 248 mg/dL — ABNORMAL HIGH (ref 70–99)

## 2014-03-07 LAB — GLUCOSE, CAPILLARY
GLUCOSE-CAPILLARY: 176 mg/dL — AB (ref 70–99)
Glucose-Capillary: 158 mg/dL — ABNORMAL HIGH (ref 70–99)

## 2014-03-08 ENCOUNTER — Telehealth: Payer: Self-pay | Admitting: Diagnostic Neuroimaging

## 2014-03-08 DIAGNOSIS — M5416 Radiculopathy, lumbar region: Secondary | ICD-10-CM

## 2014-03-08 DIAGNOSIS — E1142 Type 2 diabetes mellitus with diabetic polyneuropathy: Secondary | ICD-10-CM

## 2014-03-08 DIAGNOSIS — G20 Parkinson's disease: Secondary | ICD-10-CM

## 2014-03-08 LAB — GLUCOSE, CAPILLARY: GLUCOSE-CAPILLARY: 143 mg/dL — AB (ref 70–99)

## 2014-03-08 NOTE — Telephone Encounter (Signed)
Patient's daughter [name] calling to request that a hospital bed be ordered for patient, please call and advise.

## 2014-03-09 NOTE — Telephone Encounter (Signed)
Called patient and informed

## 2014-03-09 NOTE — Telephone Encounter (Signed)
Tried calling return number yesterday evening and this a.m. No answer.

## 2014-03-09 NOTE — Telephone Encounter (Signed)
Ordered. -VRP

## 2014-03-10 DIAGNOSIS — Z8701 Personal history of pneumonia (recurrent): Secondary | ICD-10-CM | POA: Diagnosis not present

## 2014-03-10 DIAGNOSIS — F0390 Unspecified dementia without behavioral disturbance: Secondary | ICD-10-CM | POA: Diagnosis not present

## 2014-03-10 DIAGNOSIS — G20 Parkinson's disease: Secondary | ICD-10-CM | POA: Diagnosis not present

## 2014-03-10 DIAGNOSIS — E119 Type 2 diabetes mellitus without complications: Secondary | ICD-10-CM | POA: Diagnosis not present

## 2014-03-10 DIAGNOSIS — Z9181 History of falling: Secondary | ICD-10-CM | POA: Diagnosis not present

## 2014-03-10 DIAGNOSIS — R1310 Dysphagia, unspecified: Secondary | ICD-10-CM | POA: Diagnosis not present

## 2014-03-10 DIAGNOSIS — I10 Essential (primary) hypertension: Secondary | ICD-10-CM | POA: Diagnosis not present

## 2014-03-10 DIAGNOSIS — S069X0D Unspecified intracranial injury without loss of consciousness, subsequent encounter: Secondary | ICD-10-CM | POA: Diagnosis not present

## 2014-03-10 DIAGNOSIS — Z5189 Encounter for other specified aftercare: Secondary | ICD-10-CM | POA: Diagnosis not present

## 2014-03-14 DIAGNOSIS — I10 Essential (primary) hypertension: Secondary | ICD-10-CM | POA: Diagnosis not present

## 2014-03-14 DIAGNOSIS — G20 Parkinson's disease: Secondary | ICD-10-CM | POA: Diagnosis not present

## 2014-03-14 DIAGNOSIS — E119 Type 2 diabetes mellitus without complications: Secondary | ICD-10-CM | POA: Diagnosis not present

## 2014-03-14 DIAGNOSIS — F0390 Unspecified dementia without behavioral disturbance: Secondary | ICD-10-CM | POA: Diagnosis not present

## 2014-03-14 DIAGNOSIS — R1310 Dysphagia, unspecified: Secondary | ICD-10-CM | POA: Diagnosis not present

## 2014-03-14 DIAGNOSIS — S069X0D Unspecified intracranial injury without loss of consciousness, subsequent encounter: Secondary | ICD-10-CM | POA: Diagnosis not present

## 2014-03-16 DIAGNOSIS — I10 Essential (primary) hypertension: Secondary | ICD-10-CM | POA: Diagnosis not present

## 2014-03-16 DIAGNOSIS — S069X0D Unspecified intracranial injury without loss of consciousness, subsequent encounter: Secondary | ICD-10-CM | POA: Diagnosis not present

## 2014-03-16 DIAGNOSIS — R1310 Dysphagia, unspecified: Secondary | ICD-10-CM | POA: Diagnosis not present

## 2014-03-16 DIAGNOSIS — G20 Parkinson's disease: Secondary | ICD-10-CM | POA: Diagnosis not present

## 2014-03-16 DIAGNOSIS — E119 Type 2 diabetes mellitus without complications: Secondary | ICD-10-CM | POA: Diagnosis not present

## 2014-03-16 DIAGNOSIS — F0390 Unspecified dementia without behavioral disturbance: Secondary | ICD-10-CM | POA: Diagnosis not present

## 2014-03-16 NOTE — Progress Notes (Signed)
Patient ID: Stephen Bates, male   DOB: 1927/02/03, 80 y.o.   MRN: [id number]   Pt underwent VP T11 and T12 02/08/14 Did well for 3 weeks Was admitted to [facility] for rehab  Noted new pain just 1 week ago Now located in low back---lower than before  Most movements are painful Pain meds only help slightly  Pt called yesterday and talked with PA Dr Estanislado Pandy is recommending another MRI to evaluate for possible new fx This is now in pre authorization process with scheduler  Pt has called again today with same symptoms No worse No fever  Explained to wife the process of Ins approval We will call her with time and date of MRI asap  Did tell her if sxs worsen or are so bad that he cant take pain She is to take him to emergency room for evaluation.  She has good understanding of this plan

## 2014-03-17 ENCOUNTER — Telehealth (HOSPITAL_COMMUNITY): Payer: Self-pay | Admitting: Interventional Radiology

## 2014-03-17 DIAGNOSIS — S069X0D Unspecified intracranial injury without loss of consciousness, subsequent encounter: Secondary | ICD-10-CM | POA: Diagnosis not present

## 2014-03-17 DIAGNOSIS — E119 Type 2 diabetes mellitus without complications: Secondary | ICD-10-CM | POA: Diagnosis not present

## 2014-03-17 DIAGNOSIS — F0390 Unspecified dementia without behavioral disturbance: Secondary | ICD-10-CM | POA: Diagnosis not present

## 2014-03-17 DIAGNOSIS — R1310 Dysphagia, unspecified: Secondary | ICD-10-CM | POA: Diagnosis not present

## 2014-03-17 DIAGNOSIS — G20 Parkinson's disease: Secondary | ICD-10-CM | POA: Diagnosis not present

## 2014-03-17 DIAGNOSIS — I10 Essential (primary) hypertension: Secondary | ICD-10-CM | POA: Diagnosis not present

## 2014-03-17 NOTE — Telephone Encounter (Signed)
Called pt's daughter, told her that I am waiting for insurance approval for the requested MRI and would call her back ASAP once I got that. She states understanding. JM

## 2014-03-20 DIAGNOSIS — F0390 Unspecified dementia without behavioral disturbance: Secondary | ICD-10-CM | POA: Diagnosis not present

## 2014-03-20 DIAGNOSIS — I10 Essential (primary) hypertension: Secondary | ICD-10-CM | POA: Diagnosis not present

## 2014-03-20 DIAGNOSIS — G20 Parkinson's disease: Secondary | ICD-10-CM | POA: Diagnosis not present

## 2014-03-20 DIAGNOSIS — S069X0D Unspecified intracranial injury without loss of consciousness, subsequent encounter: Secondary | ICD-10-CM | POA: Diagnosis not present

## 2014-03-20 DIAGNOSIS — R1310 Dysphagia, unspecified: Secondary | ICD-10-CM | POA: Diagnosis not present

## 2014-03-20 DIAGNOSIS — E119 Type 2 diabetes mellitus without complications: Secondary | ICD-10-CM | POA: Diagnosis not present

## 2014-03-21 DIAGNOSIS — R1310 Dysphagia, unspecified: Secondary | ICD-10-CM | POA: Diagnosis not present

## 2014-03-21 DIAGNOSIS — G20 Parkinson's disease: Secondary | ICD-10-CM | POA: Diagnosis not present

## 2014-03-21 DIAGNOSIS — S069X0D Unspecified intracranial injury without loss of consciousness, subsequent encounter: Secondary | ICD-10-CM | POA: Diagnosis not present

## 2014-03-21 DIAGNOSIS — E119 Type 2 diabetes mellitus without complications: Secondary | ICD-10-CM | POA: Diagnosis not present

## 2014-03-21 DIAGNOSIS — I10 Essential (primary) hypertension: Secondary | ICD-10-CM | POA: Diagnosis not present

## 2014-03-21 DIAGNOSIS — F0390 Unspecified dementia without behavioral disturbance: Secondary | ICD-10-CM | POA: Diagnosis not present

## 2014-03-22 ENCOUNTER — Other Ambulatory Visit (HOSPITAL_COMMUNITY): Payer: Self-pay | Admitting: Interventional Radiology

## 2014-03-22 DIAGNOSIS — G20 Parkinson's disease: Secondary | ICD-10-CM | POA: Diagnosis not present

## 2014-03-22 DIAGNOSIS — T148XXA Other injury of unspecified body region, initial encounter: Secondary | ICD-10-CM

## 2014-03-22 DIAGNOSIS — R1310 Dysphagia, unspecified: Secondary | ICD-10-CM | POA: Diagnosis not present

## 2014-03-22 DIAGNOSIS — M549 Dorsalgia, unspecified: Secondary | ICD-10-CM

## 2014-03-22 DIAGNOSIS — I10 Essential (primary) hypertension: Secondary | ICD-10-CM | POA: Diagnosis not present

## 2014-03-22 DIAGNOSIS — F0390 Unspecified dementia without behavioral disturbance: Secondary | ICD-10-CM | POA: Diagnosis not present

## 2014-03-22 DIAGNOSIS — E119 Type 2 diabetes mellitus without complications: Secondary | ICD-10-CM | POA: Diagnosis not present

## 2014-03-22 DIAGNOSIS — S069X0D Unspecified intracranial injury without loss of consciousness, subsequent encounter: Secondary | ICD-10-CM | POA: Diagnosis not present

## 2014-03-23 DIAGNOSIS — G20 Parkinson's disease: Secondary | ICD-10-CM | POA: Diagnosis not present

## 2014-03-23 DIAGNOSIS — I10 Essential (primary) hypertension: Secondary | ICD-10-CM | POA: Diagnosis not present

## 2014-03-23 DIAGNOSIS — E119 Type 2 diabetes mellitus without complications: Secondary | ICD-10-CM | POA: Diagnosis not present

## 2014-03-23 DIAGNOSIS — R1310 Dysphagia, unspecified: Secondary | ICD-10-CM | POA: Diagnosis not present

## 2014-03-23 DIAGNOSIS — F0390 Unspecified dementia without behavioral disturbance: Secondary | ICD-10-CM | POA: Diagnosis not present

## 2014-03-23 DIAGNOSIS — S069X0D Unspecified intracranial injury without loss of consciousness, subsequent encounter: Secondary | ICD-10-CM | POA: Diagnosis not present

## 2014-03-24 DIAGNOSIS — R1310 Dysphagia, unspecified: Secondary | ICD-10-CM | POA: Diagnosis not present

## 2014-03-24 DIAGNOSIS — I10 Essential (primary) hypertension: Secondary | ICD-10-CM | POA: Diagnosis not present

## 2014-03-24 DIAGNOSIS — G20 Parkinson's disease: Secondary | ICD-10-CM | POA: Diagnosis not present

## 2014-03-24 DIAGNOSIS — E119 Type 2 diabetes mellitus without complications: Secondary | ICD-10-CM | POA: Diagnosis not present

## 2014-03-24 DIAGNOSIS — S069X0D Unspecified intracranial injury without loss of consciousness, subsequent encounter: Secondary | ICD-10-CM | POA: Diagnosis not present

## 2014-03-24 DIAGNOSIS — F0390 Unspecified dementia without behavioral disturbance: Secondary | ICD-10-CM | POA: Diagnosis not present

## 2014-03-27 ENCOUNTER — Ambulatory Visit (HOSPITAL_COMMUNITY)
Admission: RE | Admit: 2014-03-27 | Discharge: 2014-03-27 | Disposition: A | Discharge status: Home / Self Care | Payer: Medicare Other | Source: Ambulatory Visit | Attending: Interventional Radiology | Admitting: Interventional Radiology

## 2014-03-27 ENCOUNTER — Other Ambulatory Visit (HOSPITAL_COMMUNITY): Payer: Self-pay | Admitting: Interventional Radiology

## 2014-03-27 DIAGNOSIS — Z9889 Other specified postprocedural states: Secondary | ICD-10-CM | POA: Diagnosis not present

## 2014-03-27 DIAGNOSIS — S32019A Unspecified fracture of first lumbar vertebra, initial encounter for closed fracture: Secondary | ICD-10-CM

## 2014-03-27 DIAGNOSIS — M549 Dorsalgia, unspecified: Secondary | ICD-10-CM | POA: Diagnosis not present

## 2014-03-27 DIAGNOSIS — Z981 Arthrodesis status: Secondary | ICD-10-CM | POA: Diagnosis not present

## 2014-03-27 DIAGNOSIS — M8448XA Pathological fracture, other site, initial encounter for fracture: Secondary | ICD-10-CM | POA: Diagnosis not present

## 2014-03-27 DIAGNOSIS — IMO0002 Reserved for concepts with insufficient information to code with codable children: Secondary | ICD-10-CM | POA: Diagnosis not present

## 2014-03-27 DIAGNOSIS — M47817 Spondylosis without myelopathy or radiculopathy, lumbosacral region: Secondary | ICD-10-CM | POA: Diagnosis not present

## 2014-03-27 DIAGNOSIS — M5137 Other intervertebral disc degeneration, lumbosacral region: Secondary | ICD-10-CM | POA: Diagnosis not present

## 2014-03-27 DIAGNOSIS — T148XXA Other injury of unspecified body region, initial encounter: Secondary | ICD-10-CM

## 2014-03-28 ENCOUNTER — Other Ambulatory Visit: Payer: Self-pay | Admitting: Radiology

## 2014-03-28 ENCOUNTER — Encounter (HOSPITAL_COMMUNITY): Payer: Self-pay

## 2014-03-28 ENCOUNTER — Ambulatory Visit (HOSPITAL_COMMUNITY)
Admission: RE | Admit: 2014-03-28 | Discharge: 2014-03-28 | Disposition: A | Discharge status: Home / Self Care | Payer: Medicare Other | Source: Ambulatory Visit | Attending: Interventional Radiology | Admitting: Interventional Radiology

## 2014-03-28 DIAGNOSIS — M545 Low back pain, unspecified: Secondary | ICD-10-CM | POA: Insufficient documentation

## 2014-03-28 DIAGNOSIS — F0390 Unspecified dementia without behavioral disturbance: Secondary | ICD-10-CM | POA: Insufficient documentation

## 2014-03-28 DIAGNOSIS — S32019A Unspecified fracture of first lumbar vertebra, initial encounter for closed fracture: Secondary | ICD-10-CM

## 2014-03-28 DIAGNOSIS — I10 Essential (primary) hypertension: Secondary | ICD-10-CM | POA: Insufficient documentation

## 2014-03-28 DIAGNOSIS — Z87891 Personal history of nicotine dependence: Secondary | ICD-10-CM | POA: Insufficient documentation

## 2014-03-28 DIAGNOSIS — M8448XA Pathological fracture, other site, initial encounter for fracture: Secondary | ICD-10-CM | POA: Diagnosis not present

## 2014-03-28 DIAGNOSIS — E119 Type 2 diabetes mellitus without complications: Secondary | ICD-10-CM | POA: Diagnosis not present

## 2014-03-28 DIAGNOSIS — S32009A Unspecified fracture of unspecified lumbar vertebra, initial encounter for closed fracture: Secondary | ICD-10-CM | POA: Diagnosis not present

## 2014-03-28 DIAGNOSIS — G589 Mononeuropathy, unspecified: Secondary | ICD-10-CM | POA: Insufficient documentation

## 2014-03-28 DIAGNOSIS — W19XXXA Unspecified fall, initial encounter: Secondary | ICD-10-CM | POA: Insufficient documentation

## 2014-03-28 DIAGNOSIS — I4510 Unspecified right bundle-branch block: Secondary | ICD-10-CM | POA: Diagnosis not present

## 2014-03-28 DIAGNOSIS — Z8546 Personal history of malignant neoplasm of prostate: Secondary | ICD-10-CM | POA: Insufficient documentation

## 2014-03-28 DIAGNOSIS — Z7982 Long term (current) use of aspirin: Secondary | ICD-10-CM | POA: Diagnosis not present

## 2014-03-28 DIAGNOSIS — G8929 Other chronic pain: Secondary | ICD-10-CM | POA: Insufficient documentation

## 2014-03-28 DIAGNOSIS — Z8673 Personal history of transient ischemic attack (TIA), and cerebral infarction without residual deficits: Secondary | ICD-10-CM | POA: Insufficient documentation

## 2014-03-28 LAB — CBC WITH DIFFERENTIAL/PLATELET
BASOS PCT: 0 % (ref 0–1)
Basophils Absolute: 0 10*3/uL (ref 0.0–0.1)
Eosinophils Absolute: 0.1 10*3/uL (ref 0.0–0.7)
Eosinophils Relative: 1 % (ref 0–5)
HCT: 44.9 % (ref 39.0–52.0)
Hemoglobin: 14.7 g/dL (ref 13.0–17.0)
LYMPHS PCT: 16 % (ref 12–46)
Lymphs Abs: 2.3 10*3/uL (ref 0.7–4.0)
MCH: 32.8 pg (ref 26.0–34.0)
MCHC: 32.7 g/dL (ref 30.0–36.0)
MCV: 100.2 fL — ABNORMAL HIGH (ref 78.0–100.0)
Monocytes Absolute: 1 10*3/uL (ref 0.1–1.0)
Monocytes Relative: 7 % (ref 3–12)
Neutro Abs: 11.3 10*3/uL — ABNORMAL HIGH (ref 1.7–7.7)
Neutrophils Relative %: 76 % (ref 43–77)
Platelets: 352 10*3/uL (ref 150–400)
RBC: 4.48 MIL/uL (ref 4.22–5.81)
RDW: 13.9 % (ref 11.5–15.5)
WBC: 14.7 10*3/uL — AB (ref 4.0–10.5)

## 2014-03-28 LAB — BASIC METABOLIC PANEL
Anion gap: 19 — ABNORMAL HIGH (ref 5–15)
BUN: 11 mg/dL (ref 6–23)
CO2: 21 mEq/L (ref 19–32)
Calcium: 9.9 mg/dL (ref 8.4–10.5)
Chloride: 94 mEq/L — ABNORMAL LOW (ref 96–112)
Creatinine, Ser: 0.44 mg/dL — ABNORMAL LOW (ref 0.50–1.35)
GFR calc Af Amer: >90 mL/min (ref >90)
GFR calc non Af Amer: >90 mL/min (ref >90)
Glucose, Bld: 140 mg/dL — ABNORMAL HIGH (ref 70–99)
Potassium: 4.2 mEq/L (ref 3.7–5.3)
Sodium: 134 mEq/L — ABNORMAL LOW (ref 137–147)

## 2014-03-28 LAB — PROTIME-INR
INR: 0.98 (ref 0.00–1.49)
PROTHROMBIN TIME: 13 s (ref 11.6–15.2)

## 2014-03-28 LAB — GLUCOSE, CAPILLARY: Glucose-Capillary: 128 mg/dL — ABNORMAL HIGH (ref 70–99)

## 2014-03-28 LAB — APTT: aPTT: 31 seconds (ref 24–37)

## 2014-03-28 MED ORDER — BUPIVACAINE HCL (PF) 0.25 % IJ SOLN
INTRAMUSCULAR | Status: AC
  Filled 2014-03-28: qty 30

## 2014-03-28 MED ORDER — TOBRAMYCIN SULFATE 1.2 G IJ SOLR
INTRAMUSCULAR | Status: AC
  Filled 2014-03-28: qty 1.2

## 2014-03-28 MED ORDER — FENTANYL CITRATE 0.05 MG/ML IJ SOLN
INTRAMUSCULAR | Status: AC | PRN
  Administered 2014-03-28: 25 ug via INTRAVENOUS

## 2014-03-28 MED ORDER — SODIUM CHLORIDE 0.9 % IV SOLN: INTRAVENOUS | Status: AC

## 2014-03-28 MED ORDER — SODIUM CHLORIDE 0.9 % IV SOLN
INTRAVENOUS | Status: DC
  Administered 2014-03-28: 09:00:00 via INTRAVENOUS

## 2014-03-28 MED ORDER — FENTANYL CITRATE 0.05 MG/ML IJ SOLN
INTRAMUSCULAR | Status: AC
  Filled 2014-03-28: qty 6

## 2014-03-28 MED ORDER — MIDAZOLAM HCL 2 MG/2ML IJ SOLN
INTRAMUSCULAR | Status: AC
  Filled 2014-03-28: qty 6

## 2014-03-28 MED ORDER — HYDROMORPHONE HCL 1 MG/ML IJ SOLN
INTRAMUSCULAR | Status: AC
  Filled 2014-03-28: qty 1

## 2014-03-28 MED ORDER — CEFAZOLIN SODIUM-DEXTROSE 2-3 GM-% IV SOLR
INTRAVENOUS | Status: AC
  Administered 2014-03-28: 11:00:00
  Filled 2014-03-28: qty 50

## 2014-03-28 MED ORDER — MIDAZOLAM HCL 2 MG/2ML IJ SOLN
INTRAMUSCULAR | Status: AC | PRN
  Administered 2014-03-28: 1 mg via INTRAVENOUS

## 2014-03-28 MED ORDER — CEFAZOLIN SODIUM-DEXTROSE 2-3 GM-% IV SOLR: 2.0000 g | Freq: Once | INTRAVENOUS | Status: DC

## 2014-03-28 NOTE — H&P (Signed)
Chief Complaint: "I'm here for my back fractures Referring Physician:Fagan HPI: Stephen Bates is an 80 y.o. male resident at [facility] center who developed back pain after a fall. He was found to have T11 and T12 compression fractures, which were treated with VP on 7/31. Developed new pain and new MRI finds acute L1 fracture. He is scheduled for VP at this level. PMHx, meds, labs reviewed. Has been NPO this am.  Past Medical History:  Past Medical History  Diagnosis Date  . Hypertension   . Prostate cancer   . Weakness of both legs   . Stroke 1982  . Wears glasses   . Arthritis   . Benign neoplasm of colon   . History of right bundle branch block   . Diabetes   . Mild dementia   . Neuropathy   . Frequent falls 2012  . Chronic back pain   . Lumbar radiculopathy     Past Surgical History:  Past Surgical History  Procedure Laterality Date  . Eye surgery  2010    both cataracts  . Colonoscopy    . Insertion prostate radiation seed  2001  . Mohs surgery  12/12/2008    Family History:  Family History  Problem Relation Age of Onset  . Colon cancer Sister   . Bladder Cancer Father   . Bronchopulmonary dysplasia Mother     Social History:  reports that he quit smoking about 44 years ago. He has never used smokeless tobacco. He reports that he drinks alcohol. He reports that he does not use illicit drugs.  Allergies: No Known Allergies  Medications:   Medication List    ASK your doctor about these medications       acetaminophen 325 MG tablet  Commonly known as:  TYLENOL  Take 650 mg by mouth every 4 (four) hours as needed for moderate pain.     aspirin EC 81 MG tablet  Take 81 mg by mouth daily.     benazepril 40 MG tablet  Commonly known as:  LOTENSIN  Take 1 tablet by mouth every morning.     carbidopa-levodopa 25-100 MG per tablet  Commonly known as:  SINEMET IR  Take 2 tablets by mouth 3 (three) times daily.     CENTRUM SILVER ADULT 50+ PO  Take 1  tablet by mouth every morning.     docusate sodium 100 MG capsule  Commonly known as:  COLACE  Take 100 mg by mouth daily.     feeding supplement (GLUCERNA SHAKE) Liqd  Take 237 mLs by mouth at bedtime.     feeding supplement (PRO-STAT SUGAR FREE 64) Liqd  Take 30 mLs by mouth every morning.     HYDROcodone-acetaminophen 5-325 MG per tablet  Commonly known as:  NORCO/VICODIN  Take 1 tablet by mouth every 6 (six) hours as needed for moderate pain.     MILK OF MAGNESIA 400 MG/5ML suspension  Generic drug:  magnesium hydroxide  Take 15 mLs by mouth daily as needed for mild constipation.     mirtazapine 30 MG tablet  Commonly known as:  REMERON  Take 30 mg by mouth at bedtime.     ondansetron 4 MG tablet  Commonly known as:  ZOFRAN  Take 4 mg by mouth every 8 (eight) hours as needed for nausea or vomiting.     polyethylene glycol packet  Commonly known as:  MIRALAX / GLYCOLAX  Take 17 g by mouth daily.     traMADol 50  MG tablet  Commonly known as:  ULTRAM  Take 50 mg by mouth 3 (three) times daily as needed for moderate pain.     vitamin B-12 1000 MCG tablet  Commonly known as:  CYANOCOBALAMIN  Take 1,000 mcg by mouth every morning.       No anticoagulants  Please HPI for pertinent positives, otherwise complete 10 system ROS negative.  Physical Exam: BP 160/71  Pulse 103  Temp(Src) 98.7 F (37.1 C) (Oral)  Resp 16  Ht 5' 3.5" (1.613 m)  Wt 122 lb (55.339 kg)  BMI 21.27 kg/m2  SpO2 100% Body mass index is 21.27 kg/(m^2).   General Appearance:  Alert, cooperative, no distress, appears stated age  Head:  Normocephalic, without obvious abnormality, atraumatic  ENT: Unremarkable  Neck: Supple, symmetrical, trachea midline  Lungs:   Clear to auscultation bilaterally, no w/r/r, respirations unlabored without use of accessory muscles.  Chest Wall:  No tenderness or deformity  Heart:  Regular rate and rhythm, S1, S2 normal, no murmur, rub or gallop.  Neurologic:  Normal affect, no gross deficits.   Results for orders placed during the hospital encounter of 03/28/14 (from the past 48 hour(s))  CBC WITH DIFFERENTIAL     Status: Abnormal   Collection Time    03/28/14  8:48 AM      Result Value Ref Range   WBC 14.7 (*) 4.0 - 10.5 K/uL   RBC 4.48  4.22 - 5.81 MIL/uL   Hemoglobin 14.7  13.0 - 17.0 g/dL   HCT 44.9  39.0 - 52.0 %   MCV 100.2 (*) 78.0 - 100.0 fL   MCH 32.8  26.0 - 34.0 pg   MCHC 32.7  30.0 - 36.0 g/dL   RDW 13.9  11.5 - 15.5 %   Platelets 352  150 - 400 K/uL   Neutrophils Relative % 76  43 - 77 %   Neutro Abs 11.3 (*) 1.7 - 7.7 K/uL   Lymphocytes Relative 16  12 - 46 %   Lymphs Abs 2.3  0.7 - 4.0 K/uL   Monocytes Relative 7  3 - 12 %   Monocytes Absolute 1.0  0.1 - 1.0 K/uL   Eosinophils Relative 1  0 - 5 %   Eosinophils Absolute 0.1  0.0 - 0.7 K/uL   Basophils Relative 0  0 - 1 %   Basophils Absolute 0.0  0.0 - 0.1 K/uL      Assessment/Plan L1 compression fractures For VP procedure today. Explained procedure, risks, complications, use of sedation. Labs reviewed, elevated WBC of uncertain etiology Dr. Estanislado Pandy to review. Consent signed in chart  [name] PA-C 03/28/2014, 9:11 AM

## 2014-03-28 NOTE — Procedures (Signed)
S/P L1KP with biopsy 

## 2014-03-28 NOTE — Discharge Instructions (Signed)
1. No stooping,bending or lifting more than 10 lbs for 2 weeks. °2.Use walker to ambulate for 2 weeks . °3.RTC in 2 weeks °KYPHOPLASTY/VERTEBROPLASTY DISCHARGE INSTRUCTIONS ° °Medications: (check all that apply) ° °   Resume all home medications as before procedure. °   °           °  °Continue your pain medications as prescribed as needed.  Over the next 3-5 days, decrease your pain medication as tolerated.  Over the counter medications (i.e. Tylenol, ibuprofen, and aleve) may be substituted once severe/moderate pain symptoms have subsided. ° ° Wound Care: °- Bandages may be removed the day following your procedure.  You may get your incision wet once bandages are removed.  Bandaids may be used to cover the incisions until scab formation.  Topical ointments are optional. ° °- If you develop a fever greater than 101 degrees, have increased skin redness at the incision sites or pus-like oozing from incisions occurring within 1 week of the procedure, contact radiology at [phone] or [phone]. ° °- Ice pack to back for 15-20 minutes 2-3 time per day for first 2-3 days post procedure.  The ice will expedite muscle healing and help with the pain from the incisions. ° ° Activity: °- Bedrest today with limited activity for 24 hours post procedure. ° °- No driving for 48 hours. ° °- Increase your activity as tolerated after bedrest (with assistance if necessary). ° °- Refrain from any strenuous activity or heavy lifting (greater than 10 lbs.). ° ° Follow up: °- Contact radiology at [phone] or [phone] if any questions/concerns. ° °- A physician assistant from radiology will contact you in approximately 1 week. ° °- If a biopsy was performed at the time of your procedure, your referring physician should receive the results in usually 2-3 days. ° ° ° ° ° ° ° ° °

## 2014-03-29 DIAGNOSIS — I10 Essential (primary) hypertension: Secondary | ICD-10-CM | POA: Diagnosis not present

## 2014-03-29 DIAGNOSIS — G20 Parkinson's disease: Secondary | ICD-10-CM | POA: Diagnosis not present

## 2014-03-29 DIAGNOSIS — R1310 Dysphagia, unspecified: Secondary | ICD-10-CM | POA: Diagnosis not present

## 2014-03-29 DIAGNOSIS — E119 Type 2 diabetes mellitus without complications: Secondary | ICD-10-CM | POA: Diagnosis not present

## 2014-03-29 DIAGNOSIS — F0390 Unspecified dementia without behavioral disturbance: Secondary | ICD-10-CM | POA: Diagnosis not present

## 2014-03-29 DIAGNOSIS — S069X0D Unspecified intracranial injury without loss of consciousness, subsequent encounter: Secondary | ICD-10-CM | POA: Diagnosis not present

## 2014-03-30 DIAGNOSIS — F0390 Unspecified dementia without behavioral disturbance: Secondary | ICD-10-CM | POA: Diagnosis not present

## 2014-03-30 DIAGNOSIS — G20 Parkinson's disease: Secondary | ICD-10-CM | POA: Diagnosis not present

## 2014-03-30 DIAGNOSIS — I10 Essential (primary) hypertension: Secondary | ICD-10-CM | POA: Diagnosis not present

## 2014-03-30 DIAGNOSIS — E119 Type 2 diabetes mellitus without complications: Secondary | ICD-10-CM | POA: Diagnosis not present

## 2014-03-30 DIAGNOSIS — R1310 Dysphagia, unspecified: Secondary | ICD-10-CM | POA: Diagnosis not present

## 2014-03-30 DIAGNOSIS — S069X0D Unspecified intracranial injury without loss of consciousness, subsequent encounter: Secondary | ICD-10-CM | POA: Diagnosis not present

## 2014-04-03 ENCOUNTER — Other Ambulatory Visit (HOSPITAL_COMMUNITY): Payer: Self-pay | Admitting: Interventional Radiology

## 2014-04-03 DIAGNOSIS — R1310 Dysphagia, unspecified: Secondary | ICD-10-CM | POA: Diagnosis not present

## 2014-04-03 DIAGNOSIS — IMO0002 Reserved for concepts with insufficient information to code with codable children: Secondary | ICD-10-CM

## 2014-04-03 DIAGNOSIS — E119 Type 2 diabetes mellitus without complications: Secondary | ICD-10-CM | POA: Diagnosis not present

## 2014-04-03 DIAGNOSIS — G20 Parkinson's disease: Secondary | ICD-10-CM | POA: Diagnosis not present

## 2014-04-03 DIAGNOSIS — S069X0D Unspecified intracranial injury without loss of consciousness, subsequent encounter: Secondary | ICD-10-CM | POA: Diagnosis not present

## 2014-04-03 DIAGNOSIS — I10 Essential (primary) hypertension: Secondary | ICD-10-CM | POA: Diagnosis not present

## 2014-04-03 DIAGNOSIS — F0390 Unspecified dementia without behavioral disturbance: Secondary | ICD-10-CM | POA: Diagnosis not present

## 2014-04-05 ENCOUNTER — Ambulatory Visit: Payer: BLUE CROSS/BLUE SHIELD | Admitting: Diagnostic Neuroimaging

## 2014-04-05 ENCOUNTER — Telehealth: Payer: Self-pay | Admitting: Diagnostic Neuroimaging

## 2014-04-05 DIAGNOSIS — F0390 Unspecified dementia without behavioral disturbance: Secondary | ICD-10-CM | POA: Diagnosis not present

## 2014-04-05 DIAGNOSIS — I10 Essential (primary) hypertension: Secondary | ICD-10-CM | POA: Diagnosis not present

## 2014-04-05 DIAGNOSIS — S069X0D Unspecified intracranial injury without loss of consciousness, subsequent encounter: Secondary | ICD-10-CM | POA: Diagnosis not present

## 2014-04-05 DIAGNOSIS — R1310 Dysphagia, unspecified: Secondary | ICD-10-CM | POA: Diagnosis not present

## 2014-04-05 DIAGNOSIS — E119 Type 2 diabetes mellitus without complications: Secondary | ICD-10-CM | POA: Diagnosis not present

## 2014-04-05 DIAGNOSIS — G20 Parkinson's disease: Secondary | ICD-10-CM | POA: Diagnosis not present

## 2014-04-05 NOTE — Telephone Encounter (Signed)
Patient's daughter, [name] requesting referral to Dr. Carles Collet for movement disorder.  Please call anytime and may leave detailed message on voicemail.

## 2014-04-05 NOTE — Telephone Encounter (Signed)
Returned patient's call. No answer. Unable to leave vmail.

## 2014-04-07 DIAGNOSIS — G20 Parkinson's disease: Secondary | ICD-10-CM | POA: Diagnosis not present

## 2014-04-07 DIAGNOSIS — R1310 Dysphagia, unspecified: Secondary | ICD-10-CM | POA: Diagnosis not present

## 2014-04-07 DIAGNOSIS — E119 Type 2 diabetes mellitus without complications: Secondary | ICD-10-CM | POA: Diagnosis not present

## 2014-04-07 DIAGNOSIS — F0390 Unspecified dementia without behavioral disturbance: Secondary | ICD-10-CM | POA: Diagnosis not present

## 2014-04-07 DIAGNOSIS — S069X0D Unspecified intracranial injury without loss of consciousness, subsequent encounter: Secondary | ICD-10-CM | POA: Diagnosis not present

## 2014-04-07 DIAGNOSIS — I10 Essential (primary) hypertension: Secondary | ICD-10-CM | POA: Diagnosis not present

## 2014-04-12 ENCOUNTER — Ambulatory Visit (HOSPITAL_COMMUNITY): Admission: RE | Admit: 2014-04-12 | Payer: Medicare Other | Source: Ambulatory Visit

## 2014-04-12 DIAGNOSIS — R1310 Dysphagia, unspecified: Secondary | ICD-10-CM | POA: Diagnosis not present

## 2014-04-12 DIAGNOSIS — F0390 Unspecified dementia without behavioral disturbance: Secondary | ICD-10-CM | POA: Diagnosis not present

## 2014-04-12 DIAGNOSIS — E119 Type 2 diabetes mellitus without complications: Secondary | ICD-10-CM | POA: Diagnosis not present

## 2014-04-12 DIAGNOSIS — S069X0D Unspecified intracranial injury without loss of consciousness, subsequent encounter: Secondary | ICD-10-CM | POA: Diagnosis not present

## 2014-04-12 DIAGNOSIS — G20 Parkinson's disease: Secondary | ICD-10-CM | POA: Diagnosis not present

## 2014-04-12 DIAGNOSIS — I10 Essential (primary) hypertension: Secondary | ICD-10-CM | POA: Diagnosis not present

## 2014-04-13 DIAGNOSIS — E119 Type 2 diabetes mellitus without complications: Secondary | ICD-10-CM | POA: Diagnosis not present

## 2014-04-13 DIAGNOSIS — I10 Essential (primary) hypertension: Secondary | ICD-10-CM | POA: Diagnosis not present

## 2014-04-13 DIAGNOSIS — S069X0D Unspecified intracranial injury without loss of consciousness, subsequent encounter: Secondary | ICD-10-CM | POA: Diagnosis not present

## 2014-04-13 DIAGNOSIS — F0390 Unspecified dementia without behavioral disturbance: Secondary | ICD-10-CM | POA: Diagnosis not present

## 2014-04-13 DIAGNOSIS — R1310 Dysphagia, unspecified: Secondary | ICD-10-CM | POA: Diagnosis not present

## 2014-04-13 DIAGNOSIS — G20 Parkinson's disease: Secondary | ICD-10-CM | POA: Diagnosis not present

## 2014-04-14 DIAGNOSIS — G20 Parkinson's disease: Secondary | ICD-10-CM | POA: Diagnosis not present

## 2014-04-14 DIAGNOSIS — S069X0D Unspecified intracranial injury without loss of consciousness, subsequent encounter: Secondary | ICD-10-CM | POA: Diagnosis not present

## 2014-04-14 DIAGNOSIS — I10 Essential (primary) hypertension: Secondary | ICD-10-CM | POA: Diagnosis not present

## 2014-04-14 DIAGNOSIS — F0390 Unspecified dementia without behavioral disturbance: Secondary | ICD-10-CM | POA: Diagnosis not present

## 2014-04-14 DIAGNOSIS — R1310 Dysphagia, unspecified: Secondary | ICD-10-CM | POA: Diagnosis not present

## 2014-04-14 DIAGNOSIS — E119 Type 2 diabetes mellitus without complications: Secondary | ICD-10-CM | POA: Diagnosis not present

## 2014-04-17 DIAGNOSIS — S069X0D Unspecified intracranial injury without loss of consciousness, subsequent encounter: Secondary | ICD-10-CM | POA: Diagnosis not present

## 2014-04-17 DIAGNOSIS — R1310 Dysphagia, unspecified: Secondary | ICD-10-CM | POA: Diagnosis not present

## 2014-04-17 DIAGNOSIS — F0390 Unspecified dementia without behavioral disturbance: Secondary | ICD-10-CM | POA: Diagnosis not present

## 2014-04-17 DIAGNOSIS — I10 Essential (primary) hypertension: Secondary | ICD-10-CM | POA: Diagnosis not present

## 2014-04-17 DIAGNOSIS — G20 Parkinson's disease: Secondary | ICD-10-CM | POA: Diagnosis not present

## 2014-04-17 DIAGNOSIS — E119 Type 2 diabetes mellitus without complications: Secondary | ICD-10-CM | POA: Diagnosis not present

## 2014-04-18 DIAGNOSIS — S069X0D Unspecified intracranial injury without loss of consciousness, subsequent encounter: Secondary | ICD-10-CM | POA: Diagnosis not present

## 2014-04-18 DIAGNOSIS — I10 Essential (primary) hypertension: Secondary | ICD-10-CM | POA: Diagnosis not present

## 2014-04-18 DIAGNOSIS — G20 Parkinson's disease: Secondary | ICD-10-CM | POA: Diagnosis not present

## 2014-04-18 DIAGNOSIS — R1310 Dysphagia, unspecified: Secondary | ICD-10-CM | POA: Diagnosis not present

## 2014-04-18 DIAGNOSIS — E119 Type 2 diabetes mellitus without complications: Secondary | ICD-10-CM | POA: Diagnosis not present

## 2014-04-18 DIAGNOSIS — F0390 Unspecified dementia without behavioral disturbance: Secondary | ICD-10-CM | POA: Diagnosis not present

## 2014-04-19 DIAGNOSIS — I10 Essential (primary) hypertension: Secondary | ICD-10-CM | POA: Diagnosis not present

## 2014-04-19 DIAGNOSIS — F0390 Unspecified dementia without behavioral disturbance: Secondary | ICD-10-CM | POA: Diagnosis not present

## 2014-04-19 DIAGNOSIS — E119 Type 2 diabetes mellitus without complications: Secondary | ICD-10-CM | POA: Diagnosis not present

## 2014-04-19 DIAGNOSIS — G20 Parkinson's disease: Secondary | ICD-10-CM | POA: Diagnosis not present

## 2014-04-19 DIAGNOSIS — R1310 Dysphagia, unspecified: Secondary | ICD-10-CM | POA: Diagnosis not present

## 2014-04-19 DIAGNOSIS — S069X0D Unspecified intracranial injury without loss of consciousness, subsequent encounter: Secondary | ICD-10-CM | POA: Diagnosis not present

## 2014-04-20 DIAGNOSIS — R1310 Dysphagia, unspecified: Secondary | ICD-10-CM | POA: Diagnosis not present

## 2014-04-20 DIAGNOSIS — G20 Parkinson's disease: Secondary | ICD-10-CM | POA: Diagnosis not present

## 2014-04-20 DIAGNOSIS — F0390 Unspecified dementia without behavioral disturbance: Secondary | ICD-10-CM | POA: Diagnosis not present

## 2014-04-20 DIAGNOSIS — E119 Type 2 diabetes mellitus without complications: Secondary | ICD-10-CM | POA: Diagnosis not present

## 2014-04-20 DIAGNOSIS — I10 Essential (primary) hypertension: Secondary | ICD-10-CM | POA: Diagnosis not present

## 2014-04-20 DIAGNOSIS — S069X0D Unspecified intracranial injury without loss of consciousness, subsequent encounter: Secondary | ICD-10-CM | POA: Diagnosis not present

## 2014-04-21 ENCOUNTER — Encounter: Payer: Self-pay | Admitting: Neurology

## 2014-04-21 ENCOUNTER — Ambulatory Visit (INDEPENDENT_AMBULATORY_CARE_PROVIDER_SITE_OTHER): Payer: Medicare Other | Admitting: Neurology

## 2014-04-21 VITALS — BP 124/70 | HR 113 | Resp 12

## 2014-04-21 DIAGNOSIS — F0390 Unspecified dementia without behavioral disturbance: Secondary | ICD-10-CM | POA: Diagnosis not present

## 2014-04-21 DIAGNOSIS — G20 Parkinson's disease: Secondary | ICD-10-CM | POA: Diagnosis not present

## 2014-04-21 DIAGNOSIS — I6782 Cerebral ischemia: Secondary | ICD-10-CM | POA: Diagnosis not present

## 2014-04-21 DIAGNOSIS — E119 Type 2 diabetes mellitus without complications: Secondary | ICD-10-CM | POA: Diagnosis not present

## 2014-04-21 DIAGNOSIS — G214 Vascular parkinsonism: Secondary | ICD-10-CM

## 2014-04-21 DIAGNOSIS — R1310 Dysphagia, unspecified: Secondary | ICD-10-CM | POA: Diagnosis not present

## 2014-04-21 DIAGNOSIS — I10 Essential (primary) hypertension: Secondary | ICD-10-CM | POA: Diagnosis not present

## 2014-04-21 DIAGNOSIS — F0150 Vascular dementia without behavioral disturbance: Secondary | ICD-10-CM | POA: Diagnosis not present

## 2014-04-21 DIAGNOSIS — S069X0D Unspecified intracranial injury without loss of consciousness, subsequent encounter: Secondary | ICD-10-CM | POA: Diagnosis not present

## 2014-04-21 NOTE — Patient Instructions (Signed)
1. On/off test scheduled for 05/11/14 at 1:30 pm. Do not take any Carbidopa Levodopa on the day of 05/11/2014. Come prepared to wait in the office for 30 minutes.

## 2014-04-21 NOTE — Progress Notes (Signed)
Stephen Bates was seen today in the movement disorders clinic for neurologic consultation.   The consultation is for the evaluation of parkinsonism.  The patient has previously seen Dr. Leta Baptist and I had the opportunity to review his records.  The patient first saw him in April, 2015 but reports are that symptoms date back to 2012.  The patient didn't see anyone back then, however.  Wife states that the first sx in 2012 was balance and gait instability.  He was having falls for no known reason.  There were no memory problems back in 2012.  Pts daughter states that there were multiple falls between 2012 and 2015 but there was really no known etiology for the falls initially and then the PCP thought in 2015 he had PD and he was referred to Dr. Leta Baptist.   The patient was not given any definitive diagnosis but was given a differential diagnosis of "Parkinson's disease versus Parkinson's plus versus  dementia with Lewy body versus vascular dementia."  He was started on carbidopa/levodopa 25/100 in April 2015 at one tablet 3 times a day and on 11/25/2013 this was increased to 2 tablets 3 times per day (8:30/1:30/7:00pm).  Pt cannot tell when it kicks in or when it wears off.  He is not sure that it helps but daughter thinks that perhaps it has helped recent memory change.  Pt hasn't been out of the bed for almost 3 weeks.  Has had 2 kyphoplastys in the last 2 months and went downhill prior to those and just never regained function.  He quit walking because of back pain and never regained the ability after the surgeries, despite a SNF rehab stay.  He is in PT with advanced home care  Specific Symptoms:  Tremor: Yes.   (only in legs and only when nervous; when they describe doesn't particularly sound like tremor and may reflect tremulousness due to weakness) Voice: slightly more hypophonic Sleep: sleeps well  Vivid Dreams:  Yes.    Acting out dreams:  No. (very rarely will yell out) Wet Pillows:  No. Postural symptoms:  Yes.   (currently uses transport chair at all times)  Falls?  Yes.    Bradykinesia symptoms: not even getting up to use restroom right now; going to restroom in urinal and using depends to have bowel movement because too scared to get up to walk even though has bedside commode.  Started all with back pain before kyphoplasty but back pain is better but now admits that too scared to walk Loss of smell:  No. Loss of taste:  No. Urinary Incontinence:  No. Difficulty Swallowing:  No. Handwriting, micrographia: Yes.   Trouble with ADL's:  Yes.    Trouble buttoning clothing: Yes.   Depression:  Yes.   (but admits to "been that way all my life") Memory changes:  Yes.   but daughter states that better after initiation of levodopa Hallucinations:  No.  visual distortions: No. N/V:  No. Lightheaded:  No.  Syncope: No. Diplopia:  No. Dyskinesia:  No.  Neuroimaging has previously been performed.  It is available for my review today.  I also reviewed this with them today.  There is very significant cerebral small vessel disease.  No significant amount of atrophy.  PREVIOUS MEDICATIONS: Sinemet  ALLERGIES:  No Known Allergies  CURRENT MEDICATIONS:  Outpatient Encounter Prescriptions as of 04/21/2014  Medication Sig  . acetaminophen (TYLENOL) 325 MG tablet Take 650 mg by mouth every 4 (four) hours  as needed for moderate pain.  . Amino Acids-Protein Hydrolys (FEEDING SUPPLEMENT, PRO-STAT SUGAR FREE 64,) LIQD Take 30 mLs by mouth every morning.  Marland Kitchen aspirin EC 81 MG tablet Take 81 mg by mouth daily.  . benazepril (LOTENSIN) 40 MG tablet Take 1 tablet by mouth every morning.   . carbidopa-levodopa (SINEMET IR) 25-100 MG per tablet Take 2 tablets by mouth 3 (three) times daily.  Marland Kitchen docusate sodium (COLACE) 100 MG capsule Take 100 mg by mouth daily.  . feeding supplement, GLUCERNA SHAKE, (GLUCERNA SHAKE) LIQD Take 237 mLs by mouth at bedtime.  . magnesium hydroxide (MILK OF  MAGNESIA) 400 MG/5ML suspension Take 15 mLs by mouth daily as needed for mild constipation.  . mirtazapine (REMERON) 30 MG tablet Take 30 mg by mouth at bedtime.  . Multiple Vitamins-Minerals (CENTRUM SILVER ADULT 50+ PO) Take 1 tablet by mouth every morning.  . polyethylene glycol (MIRALAX / GLYCOLAX) packet Take 17 g by mouth daily.  . vitamin B-12 (CYANOCOBALAMIN) 1000 MCG tablet Take 1,000 mcg by mouth every morning.  . [DISCONTINUED] HYDROcodone-acetaminophen (NORCO/VICODIN) 5-325 MG per tablet Take 1 tablet by mouth every 6 (six) hours as needed for moderate pain.  . [DISCONTINUED] ondansetron (ZOFRAN) 4 MG tablet Take 4 mg by mouth every 8 (eight) hours as needed for nausea or vomiting.  . [DISCONTINUED] traMADol (ULTRAM) 50 MG tablet Take 50 mg by mouth 3 (three) times daily as needed for moderate pain.    PAST MEDICAL HISTORY:   Past Medical History  Diagnosis Date  . Hypertension   . Prostate cancer   . Weakness of both legs   . Stroke 1982  . Wears glasses   . Arthritis   . Benign neoplasm of colon   . History of right bundle branch block   . Diabetes   . Mild dementia   . Neuropathy   . Frequent falls 2012  . Chronic back pain   . Lumbar radiculopathy     PAST SURGICAL HISTORY:   Past Surgical History  Procedure Laterality Date  . Eye surgery  2010    both cataracts  . Colonoscopy    . Insertion prostate radiation seed  2001  . Mohs surgery  12/12/2008  . Kyphoplasty      SOCIAL HISTORY:   History   Social History  . Marital Status: Married    Spouse Name: [name]    Number of Children: 2  . Years of Education: HS   Occupational History  . Retired     Teacher, early years/pre   Social History Main Topics  . Smoking status: Former Smoker    Quit date: 06/08/1969  . Smokeless tobacco: Never Used  . Alcohol Use: Yes     Comment: daily x2 now   . Drug Use: No  . Sexual Activity: Not on file   Other Topics Concern  . Not on file   Social History  Narrative   Lives with wife in one story home.  2 daughters.  Education: 11th grade.  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          FAMILY HISTORY:   Family Status  Relation Status Death Age  . Father Deceased 12  . Mother Deceased 63  . Brother Alive     healthy  . Sister Deceased     colon CA  . Child Alive     2, healthy    ROS:  A complete 10 system review of systems was obtained  and was unremarkable apart from what is mentioned above.  PHYSICAL EXAMINATION:    VITALS:   Filed Vitals:   04/21/14 1227  BP: 124/70  Pulse: 113  Resp: 12  SpO2: 95%    GEN:  The patient appears stated age and is in NAD. HEENT:  Normocephalic, atraumatic.  The mucous membranes are moist. The superficial temporal arteries are without ropiness or tenderness.   CV:  Tachycardic with regular rhythm. Lungs:  CTAB Neck/HEME:  There are no carotid bruits bilaterally.  Neck held in flexed position.  Neurological examination:  Orientation: The patient is alert and oriented x3.  He is slow to answer questions, but is able to answer them.  He names the months backwards. Cranial nerves: There is good facial symmetry. There is facial hypomimia.  Pupils are equal round and reactive to light bilaterally. Fundoscopic exam reveals clear margins bilaterally. Extraocular muscles are intact. The visual fields are full to confrontational testing. The speech is fluent and clear. Not significantly hypophonic.  Soft palate rises symmetrically and there is no tongue deviation. Hearing is intact to conversational tone. Sensation: Sensation is intact to light and pinprick throughout (facial, trunk, extremities). Vibration is decreased at the bilateral big toe. There is no extinction with double simultaneous stimulation. There is no sensory dermatomal level identified. Motor: Strength is 5/5 in the bilateral upper and lower extremities.   Shoulder shrug is equal and symmetric.  There is no pronator drift. Deep  tendon reflexes: Deep tendon reflexes are 2-/4 at the bilateral biceps, triceps, brachioradialis, patella and trace at the bilateral achilles. Plantar responses are downgoing bilaterally.  Movement examination: Tone: There is normal tone in the bilateral upper extremities.  The tone in the lower extremities is normal.  Abnormal movements: None Coordination:  There is no significant decremation with RAM's Gait and Station: The patient has significant difficulty arising out of a deep-seated chair without the use of the hands.  Even with 2 person assist, the patient is max assist to get out of the chair and cannot take a step even with a walker with max assist.    ASSESSMENT/PLAN:  1.  Parkinsonism.  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  I do not think that this represents idiopathic Parkinson's disease.  This started with falls, which is very atypical for idiopathic Parkinson's disease.  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  He is not sure that the levodopa has helped, nor am I since I saw him at 1:30 PM and he had not taken his medication since early this morning.  However, we decided to proceed with an on/off test to determine if levodopa is helping at all.  -I. think that the biggest issue, by far, his deconditioning.  He has not been out of bed for 3-4 weeks and is not even transferring.  He has resorted to using depends even though he has bowel control, as he cannot transfer to the bedside commode.  His wife then cleans him up.  I think he needs to go back to intensive inpatient rehabilitation at a SNF and I discussed this with him today.  I am not sure that he will ever be able to walk significantly, but at least to be able to transfer would be the goal.  2.  Memory change.  -Again, I  think  that this is vascular in nature.  He does have a history of hypertension and diabetes and a remote history of tobacco use.  It would be my recommendation that the alcohol be discontinued.  I will do a MoCA next visit.  3.  after his on/off test, I told the patient that he could certainly keep his appointment with Dr. Leta Baptist for his regularly scheduled visit.  Greater than 50% of 60 min visit in counseling and coordinating care.

## 2014-04-24 ENCOUNTER — Telehealth: Payer: Self-pay | Admitting: Neurology

## 2014-04-24 NOTE — Telephone Encounter (Signed)
[name], pt's daughter called requesting to speak to a nurse regarding being referred to  A physical therapist. C/B [phone]

## 2014-04-24 NOTE — Telephone Encounter (Signed)
Spoke with patient's daughter and she was interested in looking into some facilities to place patient for therapy. I gave her the names of [facility] but told her she could look up other facilities and call them and visit to decide what would be best for them. She will call back if she needs any additional assistance.

## 2014-04-25 DIAGNOSIS — S069X0D Unspecified intracranial injury without loss of consciousness, subsequent encounter: Secondary | ICD-10-CM | POA: Diagnosis not present

## 2014-04-25 DIAGNOSIS — R1310 Dysphagia, unspecified: Secondary | ICD-10-CM | POA: Diagnosis not present

## 2014-04-25 DIAGNOSIS — E119 Type 2 diabetes mellitus without complications: Secondary | ICD-10-CM | POA: Diagnosis not present

## 2014-04-25 DIAGNOSIS — F0390 Unspecified dementia without behavioral disturbance: Secondary | ICD-10-CM | POA: Diagnosis not present

## 2014-04-25 DIAGNOSIS — I10 Essential (primary) hypertension: Secondary | ICD-10-CM | POA: Diagnosis not present

## 2014-04-25 DIAGNOSIS — G20 Parkinson's disease: Secondary | ICD-10-CM | POA: Diagnosis not present

## 2014-04-26 DIAGNOSIS — E119 Type 2 diabetes mellitus without complications: Secondary | ICD-10-CM | POA: Diagnosis not present

## 2014-04-26 DIAGNOSIS — R1310 Dysphagia, unspecified: Secondary | ICD-10-CM | POA: Diagnosis not present

## 2014-04-26 DIAGNOSIS — I10 Essential (primary) hypertension: Secondary | ICD-10-CM | POA: Diagnosis not present

## 2014-04-26 DIAGNOSIS — G20 Parkinson's disease: Secondary | ICD-10-CM | POA: Diagnosis not present

## 2014-04-26 DIAGNOSIS — F0390 Unspecified dementia without behavioral disturbance: Secondary | ICD-10-CM | POA: Diagnosis not present

## 2014-04-26 DIAGNOSIS — S069X0D Unspecified intracranial injury without loss of consciousness, subsequent encounter: Secondary | ICD-10-CM | POA: Diagnosis not present

## 2014-04-28 DIAGNOSIS — S069X0D Unspecified intracranial injury without loss of consciousness, subsequent encounter: Secondary | ICD-10-CM | POA: Diagnosis not present

## 2014-04-28 DIAGNOSIS — G20 Parkinson's disease: Secondary | ICD-10-CM | POA: Diagnosis not present

## 2014-04-28 DIAGNOSIS — E119 Type 2 diabetes mellitus without complications: Secondary | ICD-10-CM | POA: Diagnosis not present

## 2014-04-28 DIAGNOSIS — F0390 Unspecified dementia without behavioral disturbance: Secondary | ICD-10-CM | POA: Diagnosis not present

## 2014-04-28 DIAGNOSIS — R1310 Dysphagia, unspecified: Secondary | ICD-10-CM | POA: Diagnosis not present

## 2014-04-28 DIAGNOSIS — I10 Essential (primary) hypertension: Secondary | ICD-10-CM | POA: Diagnosis not present

## 2014-05-01 DIAGNOSIS — R1310 Dysphagia, unspecified: Secondary | ICD-10-CM | POA: Diagnosis not present

## 2014-05-01 DIAGNOSIS — E119 Type 2 diabetes mellitus without complications: Secondary | ICD-10-CM | POA: Diagnosis not present

## 2014-05-01 DIAGNOSIS — S069X0D Unspecified intracranial injury without loss of consciousness, subsequent encounter: Secondary | ICD-10-CM | POA: Diagnosis not present

## 2014-05-01 DIAGNOSIS — I10 Essential (primary) hypertension: Secondary | ICD-10-CM | POA: Diagnosis not present

## 2014-05-01 DIAGNOSIS — G20 Parkinson's disease: Secondary | ICD-10-CM | POA: Diagnosis not present

## 2014-05-01 DIAGNOSIS — F0390 Unspecified dementia without behavioral disturbance: Secondary | ICD-10-CM | POA: Diagnosis not present

## 2014-05-03 DIAGNOSIS — F0390 Unspecified dementia without behavioral disturbance: Secondary | ICD-10-CM | POA: Diagnosis not present

## 2014-05-03 DIAGNOSIS — I10 Essential (primary) hypertension: Secondary | ICD-10-CM | POA: Diagnosis not present

## 2014-05-03 DIAGNOSIS — E119 Type 2 diabetes mellitus without complications: Secondary | ICD-10-CM | POA: Diagnosis not present

## 2014-05-03 DIAGNOSIS — R1310 Dysphagia, unspecified: Secondary | ICD-10-CM | POA: Diagnosis not present

## 2014-05-03 DIAGNOSIS — G20 Parkinson's disease: Secondary | ICD-10-CM | POA: Diagnosis not present

## 2014-05-03 DIAGNOSIS — S069X0D Unspecified intracranial injury without loss of consciousness, subsequent encounter: Secondary | ICD-10-CM | POA: Diagnosis not present

## 2014-05-04 DIAGNOSIS — S069X0D Unspecified intracranial injury without loss of consciousness, subsequent encounter: Secondary | ICD-10-CM | POA: Diagnosis not present

## 2014-05-04 DIAGNOSIS — R1310 Dysphagia, unspecified: Secondary | ICD-10-CM | POA: Diagnosis not present

## 2014-05-04 DIAGNOSIS — E119 Type 2 diabetes mellitus without complications: Secondary | ICD-10-CM | POA: Diagnosis not present

## 2014-05-04 DIAGNOSIS — G20 Parkinson's disease: Secondary | ICD-10-CM | POA: Diagnosis not present

## 2014-05-04 DIAGNOSIS — I10 Essential (primary) hypertension: Secondary | ICD-10-CM | POA: Diagnosis not present

## 2014-05-04 DIAGNOSIS — F0390 Unspecified dementia without behavioral disturbance: Secondary | ICD-10-CM | POA: Diagnosis not present

## 2014-05-05 DIAGNOSIS — E119 Type 2 diabetes mellitus without complications: Secondary | ICD-10-CM | POA: Diagnosis not present

## 2014-05-05 DIAGNOSIS — G20 Parkinson's disease: Secondary | ICD-10-CM | POA: Diagnosis not present

## 2014-05-05 DIAGNOSIS — F0390 Unspecified dementia without behavioral disturbance: Secondary | ICD-10-CM | POA: Diagnosis not present

## 2014-05-05 DIAGNOSIS — I10 Essential (primary) hypertension: Secondary | ICD-10-CM | POA: Diagnosis not present

## 2014-05-05 DIAGNOSIS — S069X0D Unspecified intracranial injury without loss of consciousness, subsequent encounter: Secondary | ICD-10-CM | POA: Diagnosis not present

## 2014-05-05 DIAGNOSIS — Z23 Encounter for immunization: Secondary | ICD-10-CM | POA: Diagnosis not present

## 2014-05-05 DIAGNOSIS — R1310 Dysphagia, unspecified: Secondary | ICD-10-CM | POA: Diagnosis not present

## 2014-05-09 DIAGNOSIS — R1310 Dysphagia, unspecified: Secondary | ICD-10-CM | POA: Diagnosis not present

## 2014-05-09 DIAGNOSIS — Z8701 Personal history of pneumonia (recurrent): Secondary | ICD-10-CM | POA: Diagnosis not present

## 2014-05-09 DIAGNOSIS — F0390 Unspecified dementia without behavioral disturbance: Secondary | ICD-10-CM | POA: Diagnosis not present

## 2014-05-09 DIAGNOSIS — G20 Parkinson's disease: Secondary | ICD-10-CM | POA: Diagnosis not present

## 2014-05-09 DIAGNOSIS — E1100 Type 2 diabetes mellitus with hyperosmolarity without nonketotic hyperglycemic-hyperosmolar coma (NKHHC): Secondary | ICD-10-CM | POA: Diagnosis not present

## 2014-05-09 DIAGNOSIS — S069X0D Unspecified intracranial injury without loss of consciousness, subsequent encounter: Secondary | ICD-10-CM | POA: Diagnosis not present

## 2014-05-09 DIAGNOSIS — R130 Aphagia: Secondary | ICD-10-CM | POA: Diagnosis not present

## 2014-05-09 DIAGNOSIS — Z9181 History of falling: Secondary | ICD-10-CM | POA: Diagnosis not present

## 2014-05-09 DIAGNOSIS — E119 Type 2 diabetes mellitus without complications: Secondary | ICD-10-CM | POA: Diagnosis not present

## 2014-05-09 DIAGNOSIS — I10 Essential (primary) hypertension: Secondary | ICD-10-CM | POA: Diagnosis not present

## 2014-05-10 DIAGNOSIS — G20 Parkinson's disease: Secondary | ICD-10-CM | POA: Diagnosis not present

## 2014-05-10 DIAGNOSIS — R1310 Dysphagia, unspecified: Secondary | ICD-10-CM | POA: Diagnosis not present

## 2014-05-10 DIAGNOSIS — S069X0D Unspecified intracranial injury without loss of consciousness, subsequent encounter: Secondary | ICD-10-CM | POA: Diagnosis not present

## 2014-05-10 DIAGNOSIS — F0390 Unspecified dementia without behavioral disturbance: Secondary | ICD-10-CM | POA: Diagnosis not present

## 2014-05-10 DIAGNOSIS — E119 Type 2 diabetes mellitus without complications: Secondary | ICD-10-CM | POA: Diagnosis not present

## 2014-05-10 DIAGNOSIS — I10 Essential (primary) hypertension: Secondary | ICD-10-CM | POA: Diagnosis not present

## 2014-05-11 ENCOUNTER — Encounter: Payer: Self-pay | Admitting: Neurology

## 2014-05-11 ENCOUNTER — Ambulatory Visit (INDEPENDENT_AMBULATORY_CARE_PROVIDER_SITE_OTHER): Payer: Medicare Other | Admitting: Neurology

## 2014-05-11 VITALS — BP 132/60 | HR 84

## 2014-05-11 DIAGNOSIS — I10 Essential (primary) hypertension: Secondary | ICD-10-CM | POA: Diagnosis not present

## 2014-05-11 DIAGNOSIS — F0390 Unspecified dementia without behavioral disturbance: Secondary | ICD-10-CM | POA: Diagnosis not present

## 2014-05-11 DIAGNOSIS — R1310 Dysphagia, unspecified: Secondary | ICD-10-CM | POA: Diagnosis not present

## 2014-05-11 DIAGNOSIS — F0150 Vascular dementia without behavioral disturbance: Secondary | ICD-10-CM | POA: Diagnosis not present

## 2014-05-11 DIAGNOSIS — G20 Parkinson's disease: Secondary | ICD-10-CM

## 2014-05-11 DIAGNOSIS — S069X0D Unspecified intracranial injury without loss of consciousness, subsequent encounter: Secondary | ICD-10-CM | POA: Diagnosis not present

## 2014-05-11 DIAGNOSIS — E119 Type 2 diabetes mellitus without complications: Secondary | ICD-10-CM | POA: Diagnosis not present

## 2014-05-11 MED ORDER — AMBULATORY NON FORMULARY MEDICATION: 1.0000 | Freq: Every day | Status: DC

## 2014-05-11 NOTE — Progress Notes (Signed)
Stephen Bates was seen today in the movement disorders clinic for neurologic consultation.   The consultation is for the evaluation of parkinsonism.  The patient has previously seen Dr. Leta Baptist and I had the opportunity to review his records.  The patient first saw him in April, 2015 but reports are that symptoms date back to 2012.  The patient didn't see anyone back then, however.  Wife states that the first sx in 2012 was balance and gait instability.  He was having falls for no known reason.  There were no memory problems back in 2012.  Pts daughter states that there were multiple falls between 2012 and 2015 but there was really no known etiology for the falls initially and then the PCP thought in 2015 he had PD and he was referred to Dr. Leta Baptist.   The patient was not given any definitive diagnosis but was given a differential diagnosis of "Parkinson's disease versus Parkinson's plus versus  dementia with Lewy body versus vascular dementia."  He was started on carbidopa/levodopa 25/100 in April 2015 at one tablet 3 times a day and on 11/25/2013 this was increased to 2 tablets 3 times per day (8:30/1:30/7:00pm).  Pt cannot tell when it kicks in or when it wears off.  He is not sure that it helps but daughter thinks that perhaps it has helped recent memory change.  Pt hasn't been out of the bed for almost 3 weeks.  Has had 2 kyphoplastys in the last 2 months and went downhill prior to those and just never regained function.  He quit walking because of back pain and never regained the ability after the surgeries, despite a SNF rehab stay.  He is in PT with advanced home care  Specific Symptoms:  Tremor: Yes.   (only in legs and only when nervous; when they describe doesn't particularly sound like tremor and may reflect tremulousness due to weakness) Voice: slightly more hypophonic Sleep: sleeps well  Vivid Dreams:  Yes.    Acting out dreams:  No. (very rarely will yell out) Wet Pillows:  No. Postural symptoms:  Yes.   (currently uses transport chair at all times)  Falls?  Yes.    Bradykinesia symptoms: not even getting up to use restroom right now; going to restroom in urinal and using depends to have bowel movement because too scared to get up to walk even though has bedside commode.  Started all with back pain before kyphoplasty but back pain is better but now admits that too scared to walk Loss of smell:  No. Loss of taste:  No. Urinary Incontinence:  No. Difficulty Swallowing:  No. Handwriting, micrographia: Yes.   Trouble with ADL's:  Yes.    Trouble buttoning clothing: Yes.   Depression:  Yes.   (but admits to "been that way all my life") Memory changes:  Yes.   but daughter states that better after initiation of levodopa Hallucinations:  No.  visual distortions: No. N/V:  No. Lightheaded:  No.  Syncope: No. Diplopia:  No. Dyskinesia:  No.   05/11/14 update:  Patient here for UPDRS on/off testing, accompanied by his wife and daughter who supplement the history.  Patient has basically not been out of bed for 3 weeks, with the exception of 3 episodes.  He does have advanced home care coming into the house to help with bathing.  He has a small decubitus ulcer, but wife reports that is actually getting better.  Neuroimaging has previously been performed.  It is available for my review today.  I also reviewed this with them today.  There is very significant cerebral small vessel disease.  No significant amount of atrophy.  PREVIOUS MEDICATIONS: Sinemet  ALLERGIES:  No Known Allergies  CURRENT MEDICATIONS:  Outpatient Encounter Prescriptions as of 05/11/2014  Medication Sig  . acetaminophen (TYLENOL) 325 MG tablet Take 650 mg by mouth every 4 (four) hours as needed for moderate pain.  . Amino Acids-Protein Hydrolys (FEEDING SUPPLEMENT, PRO-STAT SUGAR FREE 64,) LIQD Take 30 mLs by mouth every morning.  Marland Kitchen aspirin EC 81 MG tablet Take 81 mg by mouth daily.  . benazepril  (LOTENSIN) 40 MG tablet Take 1 tablet by mouth every morning.   . carbidopa-levodopa (SINEMET IR) 25-100 MG per tablet Take 2 tablets by mouth 3 (three) times daily.  Marland Kitchen docusate sodium (COLACE) 100 MG capsule Take 100 mg by mouth daily.  . feeding supplement, GLUCERNA SHAKE, (GLUCERNA SHAKE) LIQD Take 237 mLs by mouth at bedtime.  . magnesium hydroxide (MILK OF MAGNESIA) 400 MG/5ML suspension Take 15 mLs by mouth daily as needed for mild constipation.  . mirtazapine (REMERON) 30 MG tablet Take 30 mg by mouth at bedtime.  . Multiple Vitamins-Minerals (CENTRUM SILVER ADULT 50+ PO) Take 1 tablet by mouth every morning.  . polyethylene glycol (MIRALAX / GLYCOLAX) packet Take 17 g by mouth daily.  . vitamin B-12 (CYANOCOBALAMIN) 1000 MCG tablet Take 1,000 mcg by mouth every morning.    PAST MEDICAL HISTORY:   Past Medical History  Diagnosis Date  . Hypertension   . Prostate cancer   . Weakness of both legs   . Stroke 1982  . Wears glasses   . Arthritis   . Benign neoplasm of colon   . History of right bundle branch block   . Diabetes   . Mild dementia   . Neuropathy   . Frequent falls 2012  . Chronic back pain   . Lumbar radiculopathy     PAST SURGICAL HISTORY:   Past Surgical History  Procedure Laterality Date  . Eye surgery  2010    both cataracts  . Colonoscopy    . Insertion prostate radiation seed  2001  . Mohs surgery  12/12/2008  . Kyphoplasty      SOCIAL HISTORY:   History   Social History  . Marital Status: Married    Spouse Name: [name]    Number of Children: 2  . Years of Education: HS   Occupational History  . Retired     Teacher, early years/pre   Social History Main Topics  . Smoking status: Former Smoker    Quit date: 06/08/1969  . Smokeless tobacco: Never Used  . Alcohol Use: Yes     Comment: daily x2 now   . Drug Use: No  . Sexual Activity: Not on file   Other Topics Concern  . Not on file   Social History Narrative   Lives with wife in one  story home.  2 daughters.  Education: 11th grade.  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          FAMILY HISTORY:   Family Status  Relation Status Death Age  . Father Deceased 39  . Mother Deceased 48  . Brother Alive     healthy  . Sister Deceased     colon CA  . Child Alive     2, healthy    ROS:  A complete 10 system review of systems was  obtained and was unremarkable apart from what is mentioned above.  PHYSICAL EXAMINATION:    VITALS:   Filed Vitals:   05/11/14 1250  BP: 132/60  Pulse: 84    GEN:  The patient appears stated age and is in NAD. HEENT:  Normocephalic.  Healing lesion on the top of the scalp from a fall. The mucous membranes are moist. The superficial temporal arteries are without ropiness or tenderness.   CV:  Tachycardic with regular rhythm. Lungs:  CTAB Neck/HEME:  There are no carotid bruits bilaterally.  Neck held in flexed position.  Neurological examination:  Orientation:  Montreal Cognitive Assessment  05/11/2014  Visuospatial/ Executive (0/5) 1  Naming (0/3) 1  Attention: Read list of digits (0/2) 2  Attention: Read list of letters (0/1) 0  Attention: Serial 7 subtraction starting at 100 (0/3) 1  Language: Repeat phrase (0/2) 2  Language : Fluency (0/1) 0  Abstraction (0/2) 0  Delayed Recall (0/5) 0  Orientation (0/6) 4  Total 11  Adjusted Score (based on education) 12    Cranial nerves: There is good facial symmetry. There is facial hypomimia.  Pupils are equal round and reactive to light bilaterally. Fundoscopic exam reveals clear margins bilaterally. Extraocular muscles are intact. The visual fields are full to confrontational testing. The speech is fluent and clear. Not significantly hypophonic.  Soft palate rises symmetrically and there is no tongue deviation. Hearing is intact to conversational tone. Sensation: Sensation is intact to light touch throughout. Motor: Strength is 5/5 in the bilateral upper and lower extremities.    Shoulder shrug is equal and symmetric.  There is no pronator drift.   Movement examination: A complete UPDRS motor on/off test was done today and is recorded on a separate neurophysiologic worksheet.  UPDRS off score was 31 and UPDRS motor on score was 30.  ASSESSMENT/PLAN:  1.  Parkinsonism.  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  I do not think that this represents idiopathic Parkinson's disease.  This started with falls, which is very atypical for idiopathic Parkinson's disease.  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  UPDRS on/off tests done today showed that levodopa was completely ineffective.  This will be discontinued.  -I continue to think that the bigger issue is deconditioning.  I remain concerned about him living at home, but his wife like to continue to care for him in the home.  We talked about many options.  They are willing to hire a CNA, even if they have to pay out-of-pocket.  We talked about various community resources.  We talked about home instead.  I talked to them about the fact that he needed to get out of the bed to avoid decubitus ulcers, and subsequent sepsis.  He already has a decubitus.  Patient and family education provided.  A prescription for a lift chair was given. 2.  Memory change.  -Again, I think that this is vascular in nature.  He does have a history of hypertension and diabetes and a remote history of tobacco use.  It would be my recommendation that the alcohol be discontinued.   3.  I encouraged the patient to follow up with Dr. Leta Baptist, but after some discussion today asked if they could follow up here in about 6 months, which I have no objection to.  Face  to face time was 60 min with greater than 50% in counseling, as above.

## 2014-05-11 NOTE — Patient Instructions (Addendum)
1. You can stop your Levodopa. 2. You can look on [email or web address] for a CNA or can contact [facility] at [phone].

## 2014-05-12 DIAGNOSIS — R1310 Dysphagia, unspecified: Secondary | ICD-10-CM | POA: Diagnosis not present

## 2014-05-12 DIAGNOSIS — S069X0D Unspecified intracranial injury without loss of consciousness, subsequent encounter: Secondary | ICD-10-CM | POA: Diagnosis not present

## 2014-05-12 DIAGNOSIS — E119 Type 2 diabetes mellitus without complications: Secondary | ICD-10-CM | POA: Diagnosis not present

## 2014-05-12 DIAGNOSIS — F0390 Unspecified dementia without behavioral disturbance: Secondary | ICD-10-CM | POA: Diagnosis not present

## 2014-05-12 DIAGNOSIS — G20 Parkinson's disease: Secondary | ICD-10-CM | POA: Diagnosis not present

## 2014-05-12 DIAGNOSIS — I10 Essential (primary) hypertension: Secondary | ICD-10-CM | POA: Diagnosis not present

## 2014-05-15 DIAGNOSIS — G20 Parkinson's disease: Secondary | ICD-10-CM | POA: Diagnosis not present

## 2014-05-15 DIAGNOSIS — I10 Essential (primary) hypertension: Secondary | ICD-10-CM | POA: Diagnosis not present

## 2014-05-15 DIAGNOSIS — F0390 Unspecified dementia without behavioral disturbance: Secondary | ICD-10-CM | POA: Diagnosis not present

## 2014-05-15 DIAGNOSIS — R1310 Dysphagia, unspecified: Secondary | ICD-10-CM | POA: Diagnosis not present

## 2014-05-15 DIAGNOSIS — E119 Type 2 diabetes mellitus without complications: Secondary | ICD-10-CM | POA: Diagnosis not present

## 2014-05-15 DIAGNOSIS — S069X0D Unspecified intracranial injury without loss of consciousness, subsequent encounter: Secondary | ICD-10-CM | POA: Diagnosis not present

## 2014-05-16 DIAGNOSIS — S069X0D Unspecified intracranial injury without loss of consciousness, subsequent encounter: Secondary | ICD-10-CM | POA: Diagnosis not present

## 2014-05-16 DIAGNOSIS — F0390 Unspecified dementia without behavioral disturbance: Secondary | ICD-10-CM | POA: Diagnosis not present

## 2014-05-16 DIAGNOSIS — G20 Parkinson's disease: Secondary | ICD-10-CM | POA: Diagnosis not present

## 2014-05-16 DIAGNOSIS — E119 Type 2 diabetes mellitus without complications: Secondary | ICD-10-CM | POA: Diagnosis not present

## 2014-05-16 DIAGNOSIS — R1310 Dysphagia, unspecified: Secondary | ICD-10-CM | POA: Diagnosis not present

## 2014-05-16 DIAGNOSIS — I10 Essential (primary) hypertension: Secondary | ICD-10-CM | POA: Diagnosis not present

## 2014-05-18 DIAGNOSIS — S069X0D Unspecified intracranial injury without loss of consciousness, subsequent encounter: Secondary | ICD-10-CM | POA: Diagnosis not present

## 2014-05-18 DIAGNOSIS — E119 Type 2 diabetes mellitus without complications: Secondary | ICD-10-CM | POA: Diagnosis not present

## 2014-05-18 DIAGNOSIS — F0390 Unspecified dementia without behavioral disturbance: Secondary | ICD-10-CM | POA: Diagnosis not present

## 2014-05-18 DIAGNOSIS — I10 Essential (primary) hypertension: Secondary | ICD-10-CM | POA: Diagnosis not present

## 2014-05-18 DIAGNOSIS — R1310 Dysphagia, unspecified: Secondary | ICD-10-CM | POA: Diagnosis not present

## 2014-05-18 DIAGNOSIS — G20 Parkinson's disease: Secondary | ICD-10-CM | POA: Diagnosis not present

## 2014-05-22 DIAGNOSIS — F0390 Unspecified dementia without behavioral disturbance: Secondary | ICD-10-CM | POA: Diagnosis not present

## 2014-05-22 DIAGNOSIS — G20 Parkinson's disease: Secondary | ICD-10-CM | POA: Diagnosis not present

## 2014-05-22 DIAGNOSIS — S069X0D Unspecified intracranial injury without loss of consciousness, subsequent encounter: Secondary | ICD-10-CM | POA: Diagnosis not present

## 2014-05-22 DIAGNOSIS — I10 Essential (primary) hypertension: Secondary | ICD-10-CM | POA: Diagnosis not present

## 2014-05-22 DIAGNOSIS — R1310 Dysphagia, unspecified: Secondary | ICD-10-CM | POA: Diagnosis not present

## 2014-05-22 DIAGNOSIS — E119 Type 2 diabetes mellitus without complications: Secondary | ICD-10-CM | POA: Diagnosis not present

## 2014-05-24 DIAGNOSIS — F0390 Unspecified dementia without behavioral disturbance: Secondary | ICD-10-CM | POA: Diagnosis not present

## 2014-05-24 DIAGNOSIS — E119 Type 2 diabetes mellitus without complications: Secondary | ICD-10-CM | POA: Diagnosis not present

## 2014-05-24 DIAGNOSIS — I10 Essential (primary) hypertension: Secondary | ICD-10-CM | POA: Diagnosis not present

## 2014-05-24 DIAGNOSIS — R1310 Dysphagia, unspecified: Secondary | ICD-10-CM | POA: Diagnosis not present

## 2014-05-24 DIAGNOSIS — S069X0D Unspecified intracranial injury without loss of consciousness, subsequent encounter: Secondary | ICD-10-CM | POA: Diagnosis not present

## 2014-05-24 DIAGNOSIS — G20 Parkinson's disease: Secondary | ICD-10-CM | POA: Diagnosis not present

## 2014-05-25 DIAGNOSIS — G20 Parkinson's disease: Secondary | ICD-10-CM | POA: Diagnosis not present

## 2014-05-25 DIAGNOSIS — I10 Essential (primary) hypertension: Secondary | ICD-10-CM | POA: Diagnosis not present

## 2014-05-25 DIAGNOSIS — E119 Type 2 diabetes mellitus without complications: Secondary | ICD-10-CM | POA: Diagnosis not present

## 2014-05-25 DIAGNOSIS — F0390 Unspecified dementia without behavioral disturbance: Secondary | ICD-10-CM | POA: Diagnosis not present

## 2014-05-25 DIAGNOSIS — R1310 Dysphagia, unspecified: Secondary | ICD-10-CM | POA: Diagnosis not present

## 2014-05-25 DIAGNOSIS — S069X0D Unspecified intracranial injury without loss of consciousness, subsequent encounter: Secondary | ICD-10-CM | POA: Diagnosis not present

## 2014-05-26 DIAGNOSIS — S069X0D Unspecified intracranial injury without loss of consciousness, subsequent encounter: Secondary | ICD-10-CM | POA: Diagnosis not present

## 2014-05-26 DIAGNOSIS — G20 Parkinson's disease: Secondary | ICD-10-CM | POA: Diagnosis not present

## 2014-05-26 DIAGNOSIS — R1310 Dysphagia, unspecified: Secondary | ICD-10-CM | POA: Diagnosis not present

## 2014-05-26 DIAGNOSIS — E119 Type 2 diabetes mellitus without complications: Secondary | ICD-10-CM | POA: Diagnosis not present

## 2014-05-26 DIAGNOSIS — F0390 Unspecified dementia without behavioral disturbance: Secondary | ICD-10-CM | POA: Diagnosis not present

## 2014-05-26 DIAGNOSIS — I10 Essential (primary) hypertension: Secondary | ICD-10-CM | POA: Diagnosis not present

## 2014-05-29 DIAGNOSIS — E119 Type 2 diabetes mellitus without complications: Secondary | ICD-10-CM | POA: Diagnosis not present

## 2014-05-29 DIAGNOSIS — R1310 Dysphagia, unspecified: Secondary | ICD-10-CM | POA: Diagnosis not present

## 2014-05-29 DIAGNOSIS — S069X0D Unspecified intracranial injury without loss of consciousness, subsequent encounter: Secondary | ICD-10-CM | POA: Diagnosis not present

## 2014-05-29 DIAGNOSIS — F0390 Unspecified dementia without behavioral disturbance: Secondary | ICD-10-CM | POA: Diagnosis not present

## 2014-05-29 DIAGNOSIS — I10 Essential (primary) hypertension: Secondary | ICD-10-CM | POA: Diagnosis not present

## 2014-05-29 DIAGNOSIS — G20 Parkinson's disease: Secondary | ICD-10-CM | POA: Diagnosis not present

## 2014-05-31 DIAGNOSIS — R1310 Dysphagia, unspecified: Secondary | ICD-10-CM | POA: Diagnosis not present

## 2014-05-31 DIAGNOSIS — S069X0D Unspecified intracranial injury without loss of consciousness, subsequent encounter: Secondary | ICD-10-CM | POA: Diagnosis not present

## 2014-05-31 DIAGNOSIS — I10 Essential (primary) hypertension: Secondary | ICD-10-CM | POA: Diagnosis not present

## 2014-05-31 DIAGNOSIS — F0390 Unspecified dementia without behavioral disturbance: Secondary | ICD-10-CM | POA: Diagnosis not present

## 2014-05-31 DIAGNOSIS — E119 Type 2 diabetes mellitus without complications: Secondary | ICD-10-CM | POA: Diagnosis not present

## 2014-05-31 DIAGNOSIS — G20 Parkinson's disease: Secondary | ICD-10-CM | POA: Diagnosis not present

## 2014-06-08 DIAGNOSIS — G20 Parkinson's disease: Secondary | ICD-10-CM | POA: Diagnosis not present

## 2014-06-08 DIAGNOSIS — E119 Type 2 diabetes mellitus without complications: Secondary | ICD-10-CM | POA: Diagnosis not present

## 2014-06-08 DIAGNOSIS — S069X0D Unspecified intracranial injury without loss of consciousness, subsequent encounter: Secondary | ICD-10-CM | POA: Diagnosis not present

## 2014-06-08 DIAGNOSIS — F0390 Unspecified dementia without behavioral disturbance: Secondary | ICD-10-CM | POA: Diagnosis not present

## 2014-06-08 DIAGNOSIS — I10 Essential (primary) hypertension: Secondary | ICD-10-CM | POA: Diagnosis not present

## 2014-06-08 DIAGNOSIS — R1310 Dysphagia, unspecified: Secondary | ICD-10-CM | POA: Diagnosis not present

## 2014-06-09 DIAGNOSIS — F0390 Unspecified dementia without behavioral disturbance: Secondary | ICD-10-CM | POA: Diagnosis not present

## 2014-06-09 DIAGNOSIS — S069X0D Unspecified intracranial injury without loss of consciousness, subsequent encounter: Secondary | ICD-10-CM | POA: Diagnosis not present

## 2014-06-09 DIAGNOSIS — E119 Type 2 diabetes mellitus without complications: Secondary | ICD-10-CM | POA: Diagnosis not present

## 2014-06-09 DIAGNOSIS — R1310 Dysphagia, unspecified: Secondary | ICD-10-CM | POA: Diagnosis not present

## 2014-06-09 DIAGNOSIS — G20 Parkinson's disease: Secondary | ICD-10-CM | POA: Diagnosis not present

## 2014-06-09 DIAGNOSIS — I10 Essential (primary) hypertension: Secondary | ICD-10-CM | POA: Diagnosis not present

## 2014-06-13 DIAGNOSIS — I10 Essential (primary) hypertension: Secondary | ICD-10-CM | POA: Diagnosis not present

## 2014-06-13 DIAGNOSIS — E119 Type 2 diabetes mellitus without complications: Secondary | ICD-10-CM | POA: Diagnosis not present

## 2014-06-13 DIAGNOSIS — S069X0D Unspecified intracranial injury without loss of consciousness, subsequent encounter: Secondary | ICD-10-CM | POA: Diagnosis not present

## 2014-06-13 DIAGNOSIS — R1310 Dysphagia, unspecified: Secondary | ICD-10-CM | POA: Diagnosis not present

## 2014-06-13 DIAGNOSIS — F0390 Unspecified dementia without behavioral disturbance: Secondary | ICD-10-CM | POA: Diagnosis not present

## 2014-06-13 DIAGNOSIS — G20 Parkinson's disease: Secondary | ICD-10-CM | POA: Diagnosis not present

## 2014-06-15 DIAGNOSIS — S069X0D Unspecified intracranial injury without loss of consciousness, subsequent encounter: Secondary | ICD-10-CM | POA: Diagnosis not present

## 2014-06-15 DIAGNOSIS — E119 Type 2 diabetes mellitus without complications: Secondary | ICD-10-CM | POA: Diagnosis not present

## 2014-06-15 DIAGNOSIS — G20 Parkinson's disease: Secondary | ICD-10-CM | POA: Diagnosis not present

## 2014-06-15 DIAGNOSIS — R1310 Dysphagia, unspecified: Secondary | ICD-10-CM | POA: Diagnosis not present

## 2014-06-15 DIAGNOSIS — F0390 Unspecified dementia without behavioral disturbance: Secondary | ICD-10-CM | POA: Diagnosis not present

## 2014-06-15 DIAGNOSIS — I10 Essential (primary) hypertension: Secondary | ICD-10-CM | POA: Diagnosis not present

## 2014-09-04 ENCOUNTER — Ambulatory Visit: Payer: BLUE CROSS/BLUE SHIELD | Admitting: Diagnostic Neuroimaging

## 2014-10-25 IMAGING — CT CT T SPINE W/O CM
3 series · 9 of 33 positions shown, 11 images · non-contrast
Comparison: Thoracic spine series 01/23/2014.

CLINICAL DATA: Back pain.

EXAM:
CT THORACIC SPINE WITHOUT CONTRAST
TECHNIQUE: Multidetector CT imaging of the thoracic spine was performed without
intravenous contrast administration. Multiplanar CT image
reconstructions were also generated.

[Series 2: thoracic spine 2.0 b30s · axial · 0.31mm/px · z∈[-164,-164]mm · 1 of 153 slices shown, 2 images]
[im 82/153  soft-tissue]
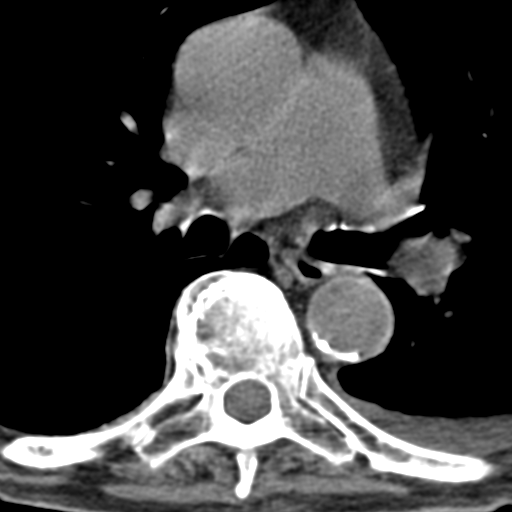
[im 82/153  bone]
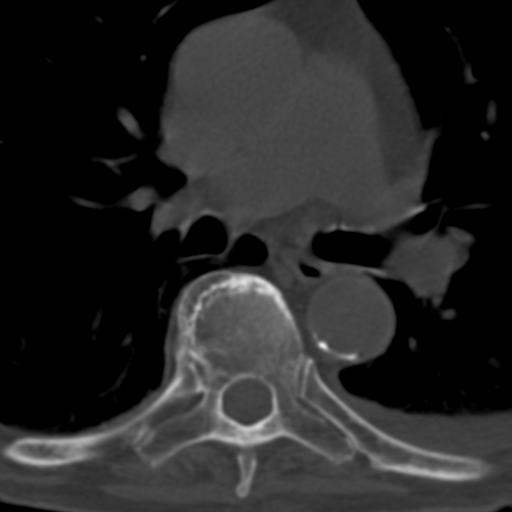

[Series 4: thoracic spine 2.0 spo cor · coronal · 0.31mm/px · 3 of 69 slices shown]
[im 14/69  bone]
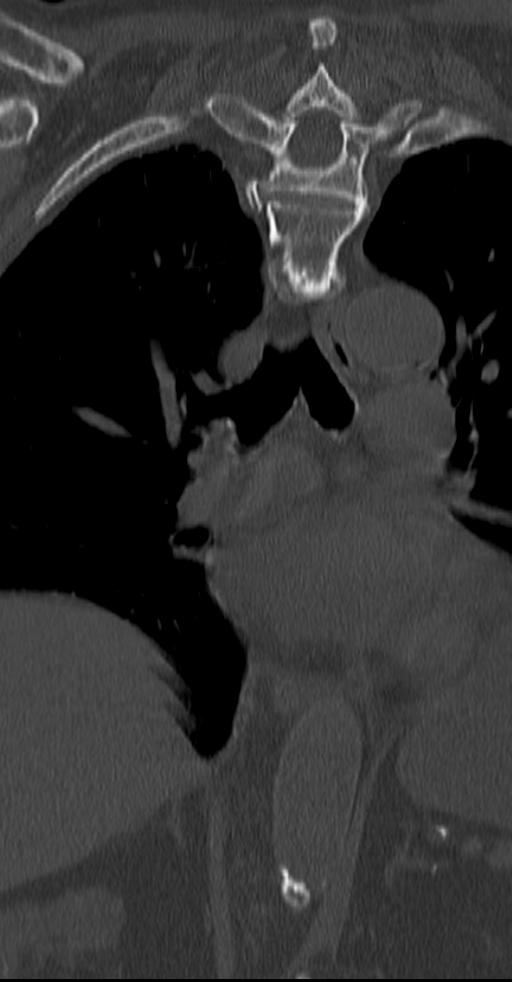
[im 28/69  bone]
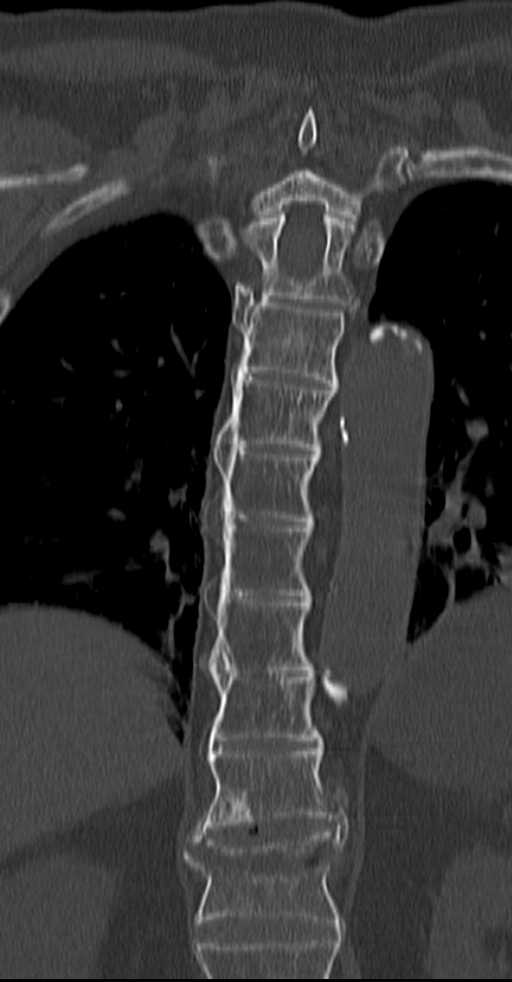
[im 41/69  bone]
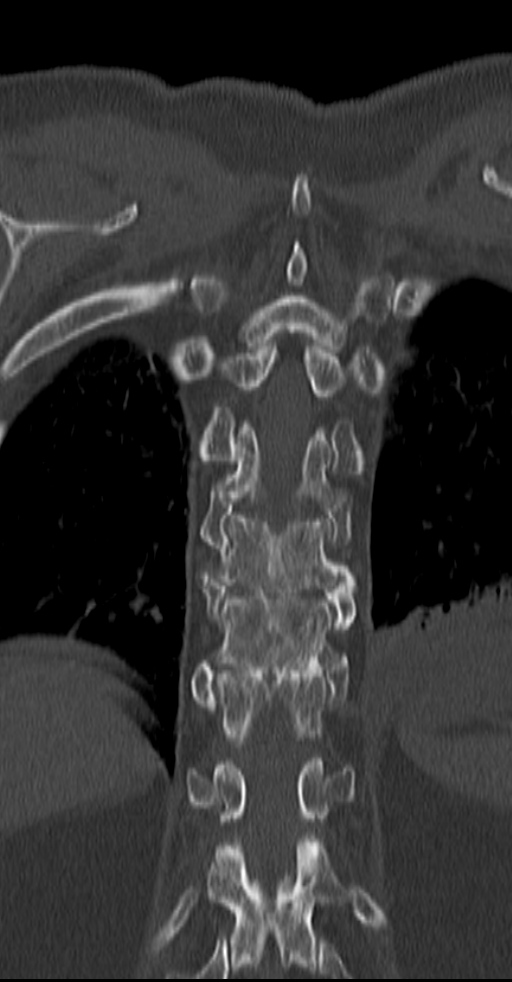

[Series 5: thoracic spine 2.0 spo sag · sagittal · 0.31mm/px · 5 of 81 slices shown, 6 images]
[im 27/81  bone]
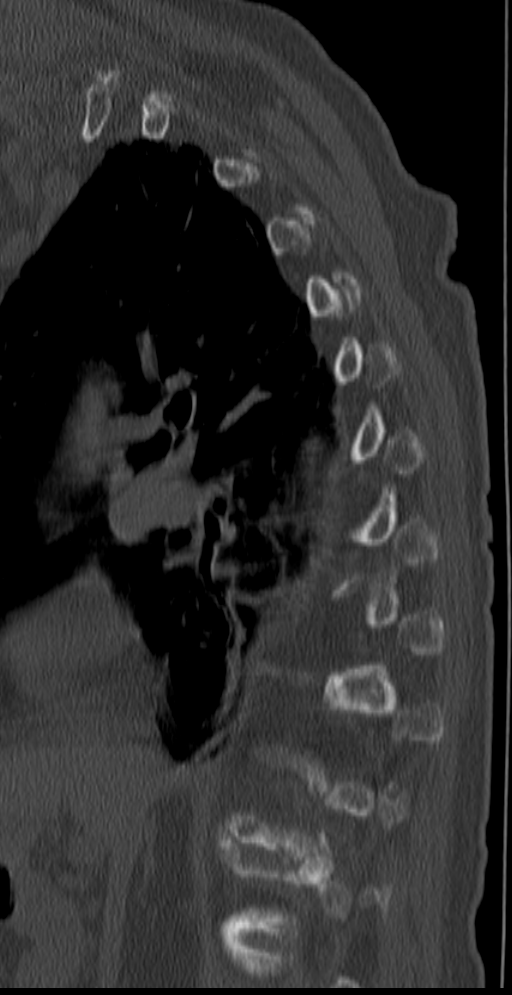
[im 34/81  bone]
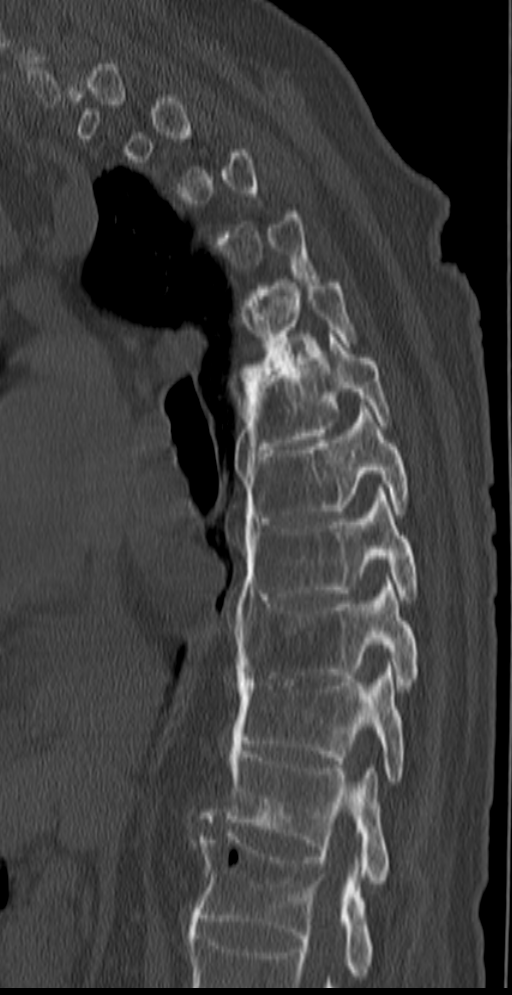
[im 41/81  soft-tissue]
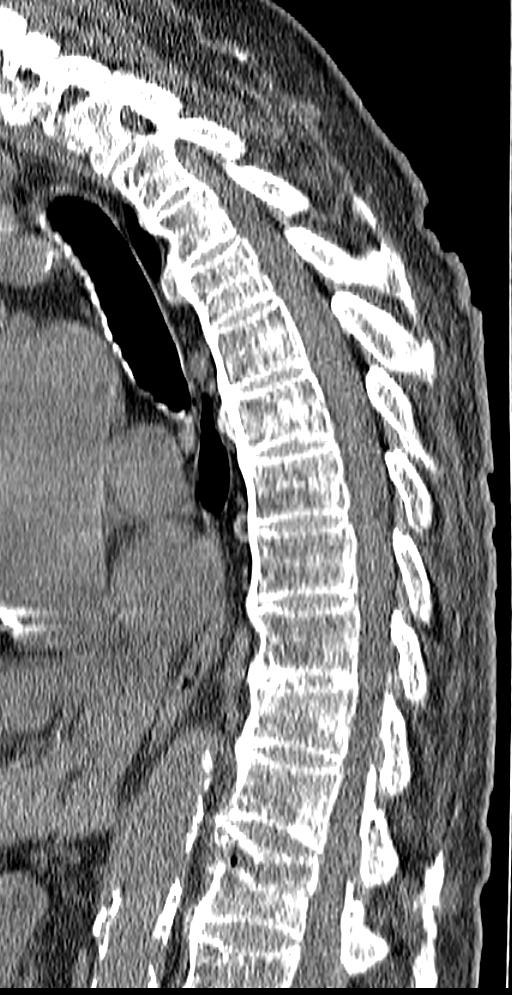
[im 41/81  bone]
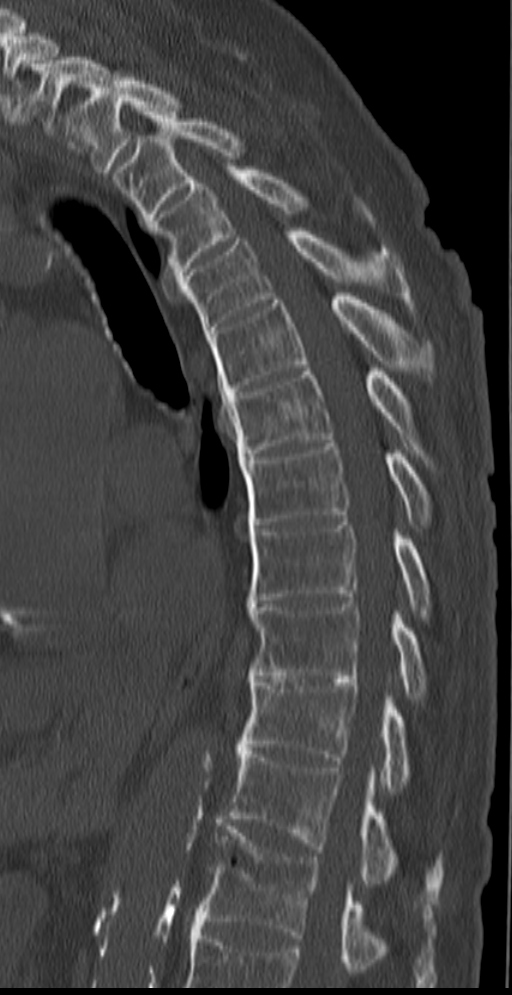
[im 47/81  bone]
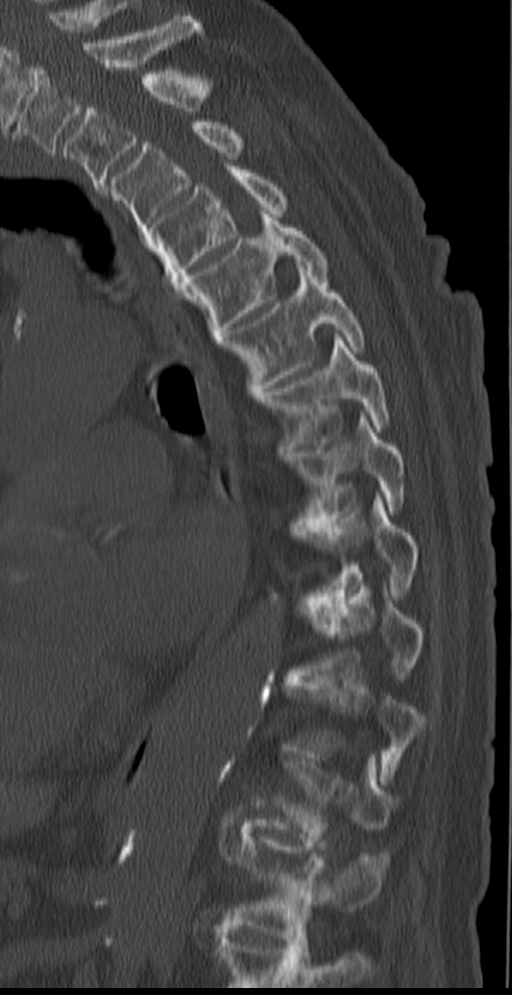
[im 54/81  bone]
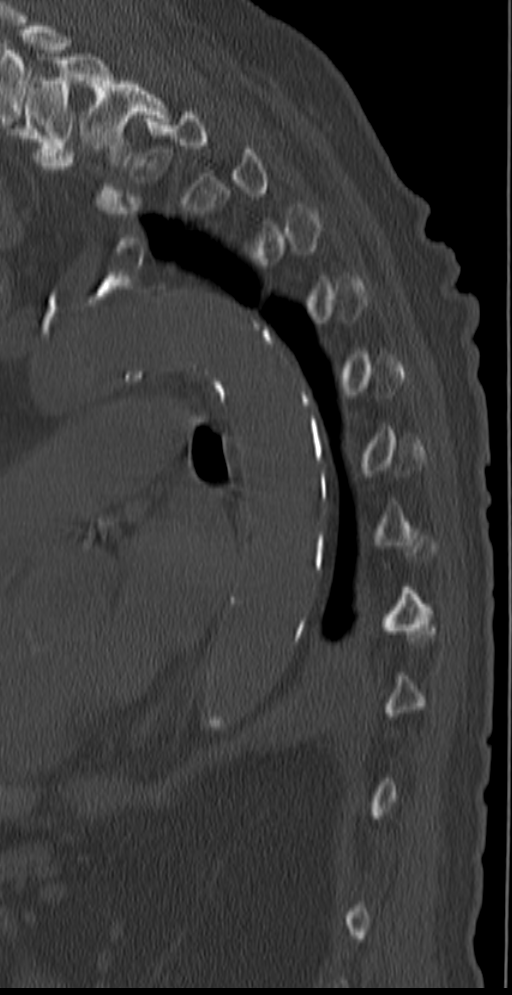

[9 of 33 positions shown; findings below may reference images not displayed]

FINDINGS: Mild left lower lobe infiltrate and small left pleural effusion
noted consistent with pneumonia. No paraspinal soft tissue swelling
noted. Aortic atherosclerotic vascular disease. Visceral
atherosclerotic vascular disease.

Diffuse degenerative changes noted of the thoracic spine. Diffuse
changes of ankylosing spondylitis. Mild compression fractures are
noted of T11 and T12. Air noted in the T11-T12 disc space as well as
in the compression fracture T12 most likely back intact phenomenon.
The possibility of discitis/osteomyelitis cannot be entirely
excluded, please correlate with patient's clinical symptoms. These
changes again most likely related to mild compressions. No bony
spinal stenosis.
IMPRESSION: 1. T11 and T12 mild compressions.  No retropulsed fragments.
2. Small amount of air noted in the T11-T12 disc space and in the
T12 compression fracture most likely vacuum phenomenon. Clinical
correlation is suggested to exclude discitis.
3. Ankylosing spondylitis.
4. Left lower lobe pneumonia with small left pleural effusion.

## 2014-11-01 IMAGING — XA IR VERTEBROPLASTY CERVICOTHORACIC INJ
1 series · 14 of 18 positions shown · IV contrast (IODINE)
Comparison: none

CLINICAL DATA: Patient with severely painful compression fracture
at T11 and T12..

[Series 300: ir kypho vertebral addl augmentation&&ir · 14 of 18 slices shown]
[im 1/18]
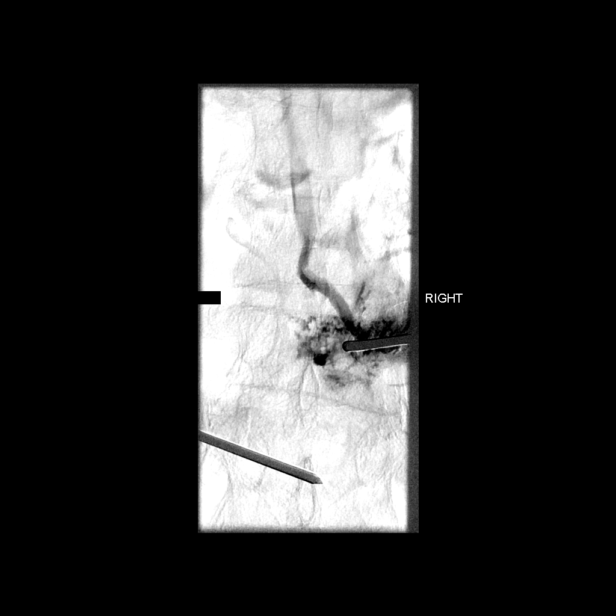
[im 2/18]
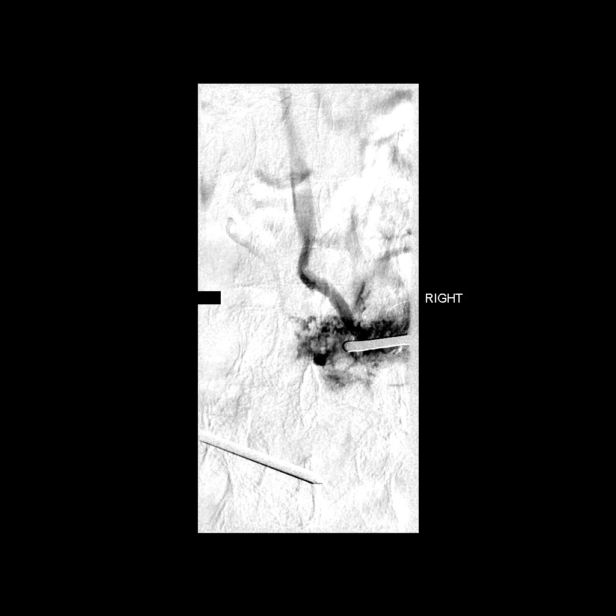
[im 4/18]
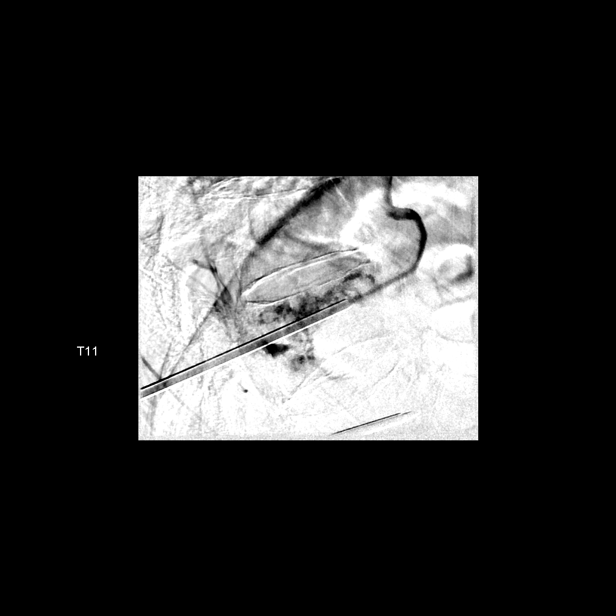
[im 5/18]
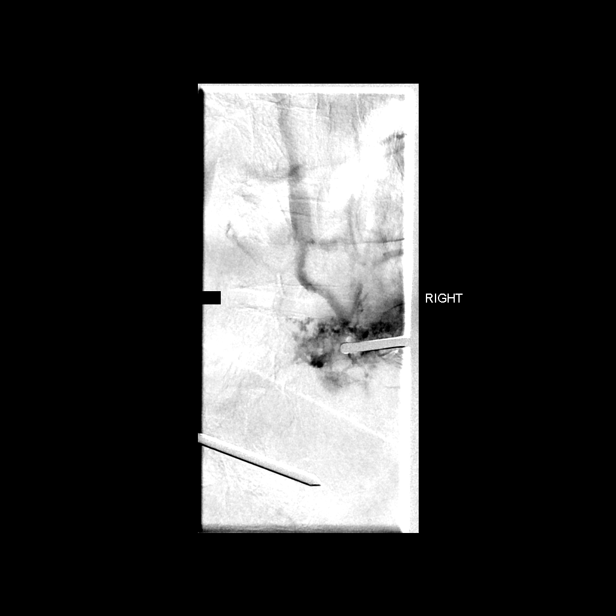
[im 6/18]
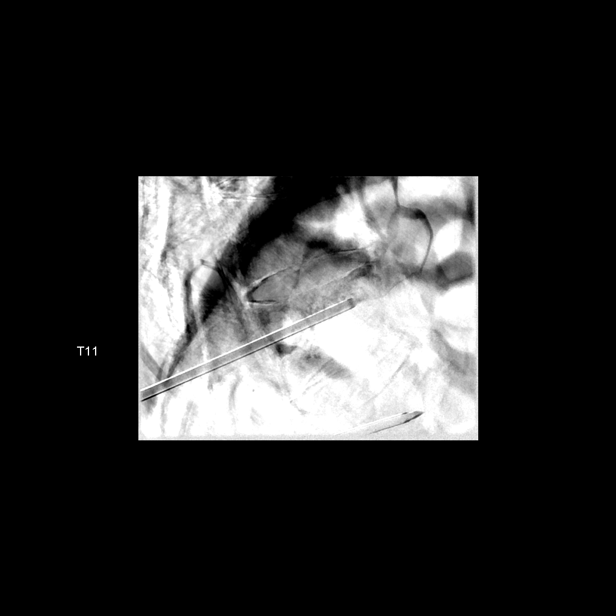
[im 8/18]
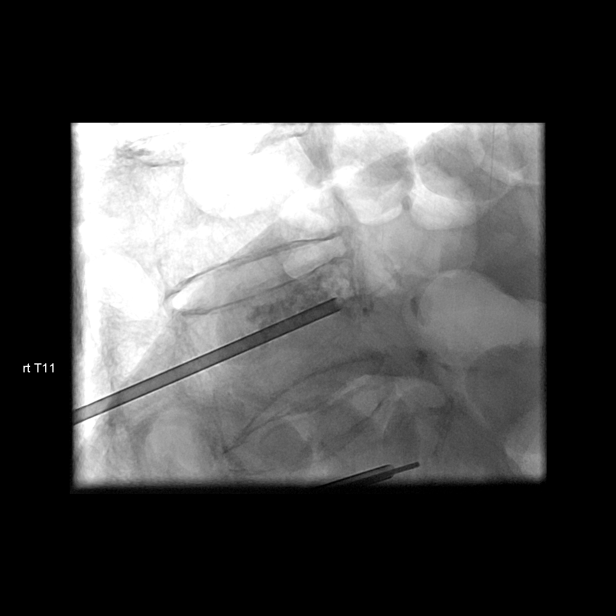
[im 9/18]
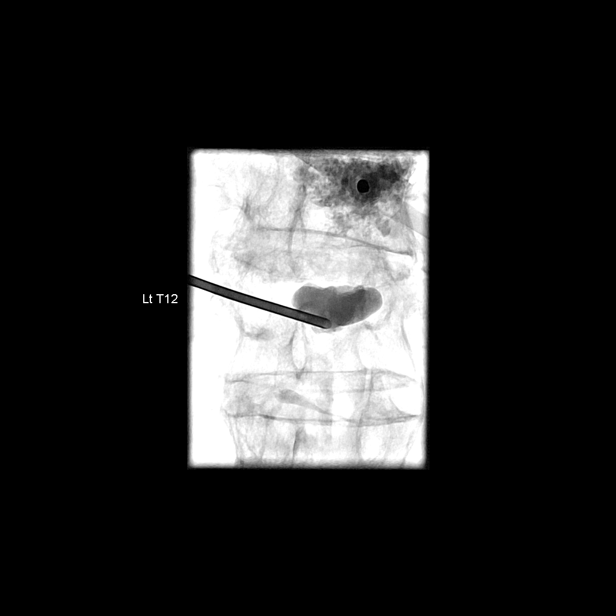
[im 10/18]
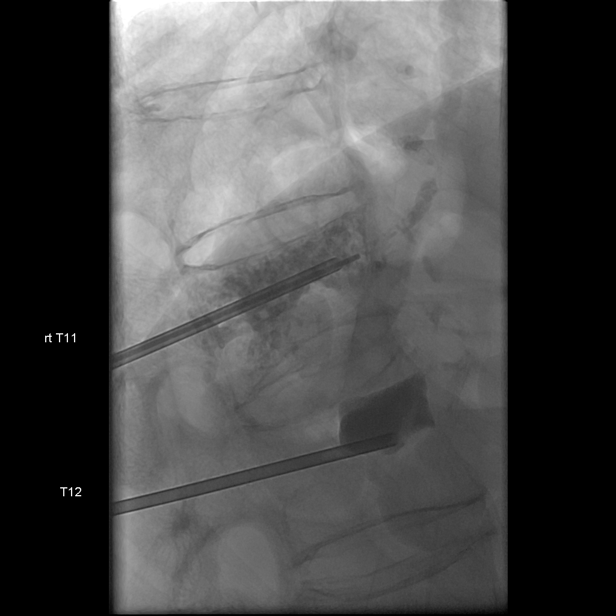
[im 11/18]
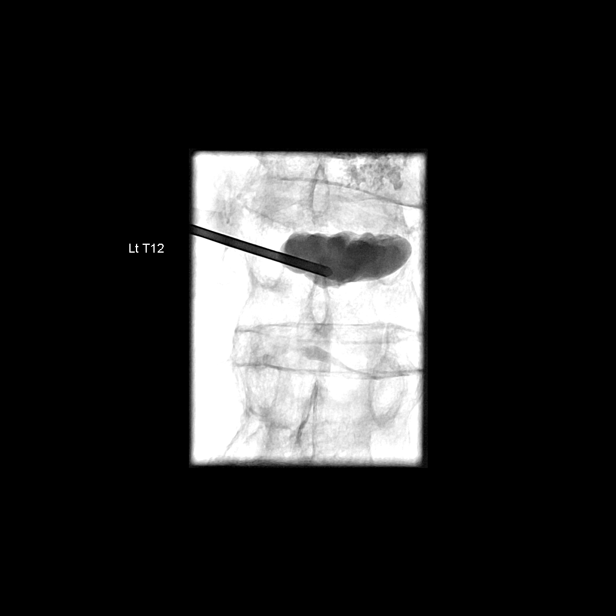
[im 13/18]
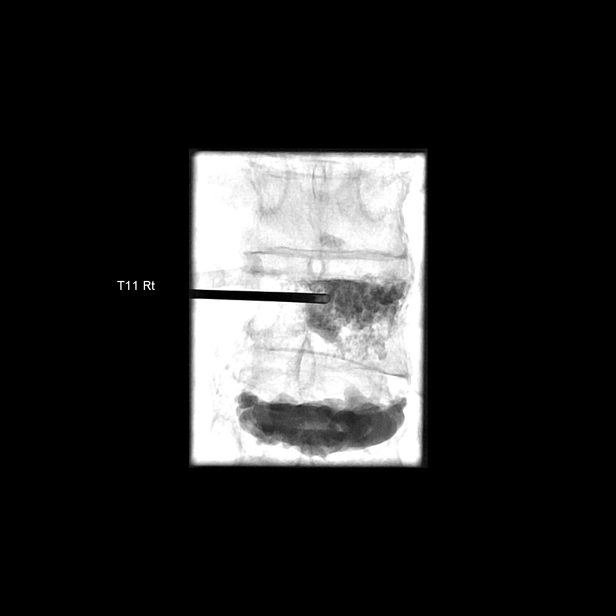
[im 14/18]
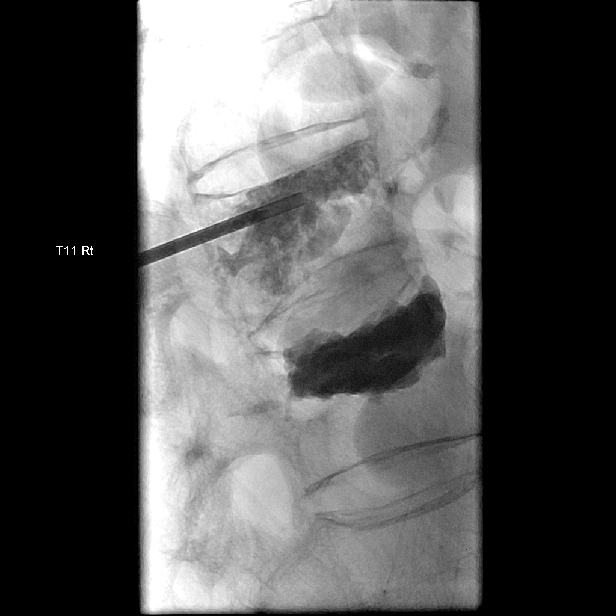
[im 15/18]
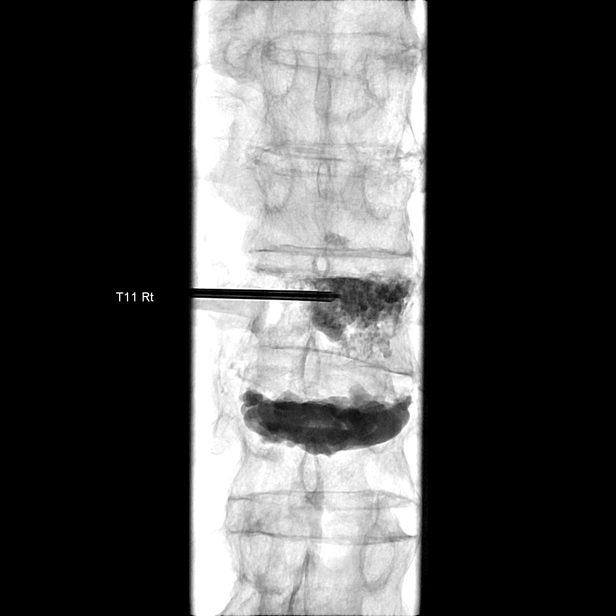
[im 17/18]
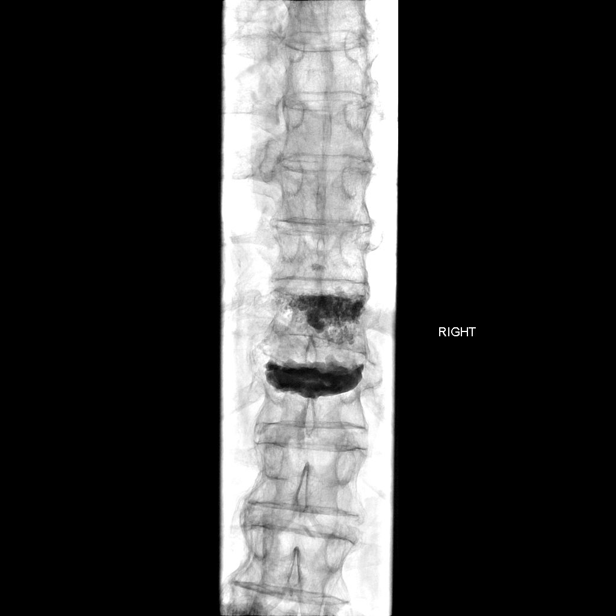
[im 18/18]
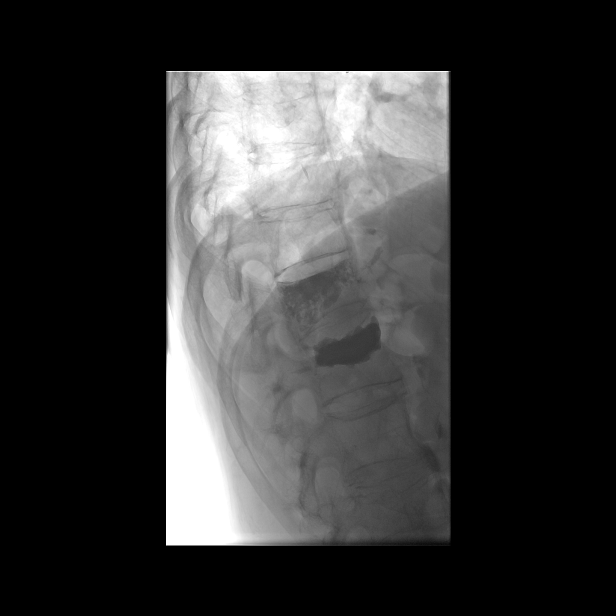

[14 of 18 positions shown; findings below may reference images not displayed]

EXAM:
IR VERTEBROPLASTY CERVICOTHORACIC INJ at T11 and T12.

MEDICATIONS:
Versed 1 mg IV, Fentanyl 37.5 mcg IV.

ANESTHESIA/SEDATION:
Total Moderate Sedation Time:  32 min.

FLUOROSCOPY TIME:  13 min 30 seconds

PROCEDURE:
Following a full explanation of the procedure along with the
potentially associated complications, a witnessed informed consent
was obtained.

The patient was placed prone on the fluoroscopic table. Nasal oxygen
was administered. Physiologic monitoring was performed throughout
the duration of the procedure. The skin overlying the thoracolumbar
region was prepped and draped in the usual sterile fashion. The T11
and T12 vertebral bodies were identified and the right and/left
pedicle at T11, and the left pedicle at T12 were infiltrated with
0.25% Bupivacaine. This was then followed by the advancement of a
13-gauge Cook needle through the pedicles into the anterior
one-third at T11 and T12. A gentle contrast injection demonstrated a
trabecular pattern of contrast with early opacification of a midline
paraspinous vein at T11. This necessitated the use of Gel-Foam
pledgets through both needles in order to mitigate the chance of
extension of the methylmethacrylate mixture to the paraspinous
venous structures.

At this time, methylmethacrylate mixture was reconstituted with
tobramycin. Under biplane intermittent fluoroscopy, the
methylmethacrylate was then injected into the T11 vertebral body via
both pedicles with filling of the vertebral body of T11. A very tiny
amount of the methylmethacrylate mixture were seen extending into
the midline paraspinous vein just distal to the T11 vertebral body
anteriorly. This remained stable throughout the procedure. Patient
did not exhibit any respiratory or hemodynamic changes.

Similarly methylmethacrylate mixture was injected at T12 with
excellent filling in the AP and lateral projections.

No extravasation was noted into the disk spaces or posteriorly into
the spinal canal. No epidural venous contamination was seen at T12.

The needles were then removed. Hemostasis was achieved at the skin
entry sites.

There were no acute complications. Patient tolerated the procedure
well. The patient was observed for 3 hours and discharged in good
condition.
IMPRESSION: Status post vertebral body augmentation for painful compression
fractures at T11 and T12 using vertebroplasty technique.

## 2014-11-03 ENCOUNTER — Other Ambulatory Visit (HOSPITAL_COMMUNITY): Payer: Self-pay | Admitting: Internal Medicine

## 2014-11-03 ENCOUNTER — Ambulatory Visit (HOSPITAL_COMMUNITY)
Admission: RE | Admit: 2014-11-03 | Discharge: 2014-11-03 | Disposition: A | Discharge status: Home / Self Care | Payer: Medicare Other | Source: Ambulatory Visit | Attending: Internal Medicine | Admitting: Internal Medicine

## 2014-11-03 DIAGNOSIS — R05 Cough: Secondary | ICD-10-CM | POA: Diagnosis not present

## 2014-11-03 DIAGNOSIS — E119 Type 2 diabetes mellitus without complications: Secondary | ICD-10-CM | POA: Insufficient documentation

## 2014-11-03 DIAGNOSIS — I10 Essential (primary) hypertension: Secondary | ICD-10-CM | POA: Insufficient documentation

## 2014-11-03 DIAGNOSIS — R918 Other nonspecific abnormal finding of lung field: Secondary | ICD-10-CM | POA: Insufficient documentation

## 2014-11-03 DIAGNOSIS — R059 Cough, unspecified: Secondary | ICD-10-CM

## 2014-11-09 ENCOUNTER — Ambulatory Visit: Payer: Medicare Other | Admitting: Neurology

## 2014-12-29 DIAGNOSIS — R2689 Other abnormalities of gait and mobility: Secondary | ICD-10-CM | POA: Diagnosis not present

## 2015-01-01 ENCOUNTER — Other Ambulatory Visit: Payer: Self-pay

## 2015-01-22 DIAGNOSIS — D649 Anemia, unspecified: Secondary | ICD-10-CM | POA: Diagnosis not present

## 2015-01-22 DIAGNOSIS — E119 Type 2 diabetes mellitus without complications: Secondary | ICD-10-CM | POA: Diagnosis not present

## 2015-01-22 DIAGNOSIS — I10 Essential (primary) hypertension: Secondary | ICD-10-CM | POA: Diagnosis not present

## 2015-01-22 DIAGNOSIS — Z79899 Other long term (current) drug therapy: Secondary | ICD-10-CM | POA: Diagnosis not present

## 2015-01-22 DIAGNOSIS — F0390 Unspecified dementia without behavioral disturbance: Secondary | ICD-10-CM | POA: Diagnosis not present

## 2015-01-22 DIAGNOSIS — R2689 Other abnormalities of gait and mobility: Secondary | ICD-10-CM | POA: Diagnosis not present

## 2015-01-22 DIAGNOSIS — D7589 Other specified diseases of blood and blood-forming organs: Secondary | ICD-10-CM | POA: Diagnosis not present

## 2015-01-22 DIAGNOSIS — D075 Carcinoma in situ of prostate: Secondary | ICD-10-CM | POA: Diagnosis not present

## 2015-01-23 ENCOUNTER — Telehealth: Payer: Self-pay | Admitting: Neurology

## 2015-01-23 NOTE — Telephone Encounter (Signed)
Notes faxed to Dr Willey Blade per patient's request to [phone] with confirmation received.

## 2015-02-01 DIAGNOSIS — M1990 Unspecified osteoarthritis, unspecified site: Secondary | ICD-10-CM | POA: Diagnosis not present

## 2015-02-01 DIAGNOSIS — Z8546 Personal history of malignant neoplasm of prostate: Secondary | ICD-10-CM | POA: Diagnosis not present

## 2015-02-01 DIAGNOSIS — I10 Essential (primary) hypertension: Secondary | ICD-10-CM | POA: Diagnosis not present

## 2015-02-01 DIAGNOSIS — L89151 Pressure ulcer of sacral region, stage 1: Secondary | ICD-10-CM | POA: Diagnosis not present

## 2015-02-01 DIAGNOSIS — Z8673 Personal history of transient ischemic attack (TIA), and cerebral infarction without residual deficits: Secondary | ICD-10-CM | POA: Diagnosis not present

## 2015-02-01 DIAGNOSIS — N400 Enlarged prostate without lower urinary tract symptoms: Secondary | ICD-10-CM | POA: Diagnosis not present

## 2015-02-01 DIAGNOSIS — L89510 Pressure ulcer of right ankle, unstageable: Secondary | ICD-10-CM | POA: Diagnosis not present

## 2015-02-01 DIAGNOSIS — Z85828 Personal history of other malignant neoplasm of skin: Secondary | ICD-10-CM | POA: Diagnosis not present

## 2015-02-01 DIAGNOSIS — G20 Parkinson's disease: Secondary | ICD-10-CM | POA: Diagnosis not present

## 2015-02-01 DIAGNOSIS — I4510 Unspecified right bundle-branch block: Secondary | ICD-10-CM | POA: Diagnosis not present

## 2015-02-01 DIAGNOSIS — E119 Type 2 diabetes mellitus without complications: Secondary | ICD-10-CM | POA: Diagnosis not present

## 2015-02-02 DIAGNOSIS — I4510 Unspecified right bundle-branch block: Secondary | ICD-10-CM | POA: Diagnosis not present

## 2015-02-02 DIAGNOSIS — Z8673 Personal history of transient ischemic attack (TIA), and cerebral infarction without residual deficits: Secondary | ICD-10-CM | POA: Diagnosis not present

## 2015-02-02 DIAGNOSIS — G20 Parkinson's disease: Secondary | ICD-10-CM | POA: Diagnosis not present

## 2015-02-02 DIAGNOSIS — M1990 Unspecified osteoarthritis, unspecified site: Secondary | ICD-10-CM | POA: Diagnosis not present

## 2015-02-02 DIAGNOSIS — I10 Essential (primary) hypertension: Secondary | ICD-10-CM | POA: Diagnosis not present

## 2015-02-02 DIAGNOSIS — L89151 Pressure ulcer of sacral region, stage 1: Secondary | ICD-10-CM | POA: Diagnosis not present

## 2015-02-06 DIAGNOSIS — I10 Essential (primary) hypertension: Secondary | ICD-10-CM | POA: Diagnosis not present

## 2015-02-06 DIAGNOSIS — G20 Parkinson's disease: Secondary | ICD-10-CM | POA: Diagnosis not present

## 2015-02-06 DIAGNOSIS — L89151 Pressure ulcer of sacral region, stage 1: Secondary | ICD-10-CM | POA: Diagnosis not present

## 2015-02-06 DIAGNOSIS — Z8673 Personal history of transient ischemic attack (TIA), and cerebral infarction without residual deficits: Secondary | ICD-10-CM | POA: Diagnosis not present

## 2015-02-06 DIAGNOSIS — M1990 Unspecified osteoarthritis, unspecified site: Secondary | ICD-10-CM | POA: Diagnosis not present

## 2015-02-06 DIAGNOSIS — I4510 Unspecified right bundle-branch block: Secondary | ICD-10-CM | POA: Diagnosis not present

## 2015-02-07 DIAGNOSIS — L89151 Pressure ulcer of sacral region, stage 1: Secondary | ICD-10-CM | POA: Diagnosis not present

## 2015-02-07 DIAGNOSIS — I10 Essential (primary) hypertension: Secondary | ICD-10-CM | POA: Diagnosis not present

## 2015-02-07 DIAGNOSIS — M1990 Unspecified osteoarthritis, unspecified site: Secondary | ICD-10-CM | POA: Diagnosis not present

## 2015-02-07 DIAGNOSIS — I4510 Unspecified right bundle-branch block: Secondary | ICD-10-CM | POA: Diagnosis not present

## 2015-02-07 DIAGNOSIS — G20 Parkinson's disease: Secondary | ICD-10-CM | POA: Diagnosis not present

## 2015-02-07 DIAGNOSIS — Z8673 Personal history of transient ischemic attack (TIA), and cerebral infarction without residual deficits: Secondary | ICD-10-CM | POA: Diagnosis not present

## 2015-02-08 DIAGNOSIS — M1990 Unspecified osteoarthritis, unspecified site: Secondary | ICD-10-CM | POA: Diagnosis not present

## 2015-02-08 DIAGNOSIS — Z8673 Personal history of transient ischemic attack (TIA), and cerebral infarction without residual deficits: Secondary | ICD-10-CM | POA: Diagnosis not present

## 2015-02-08 DIAGNOSIS — L89151 Pressure ulcer of sacral region, stage 1: Secondary | ICD-10-CM | POA: Diagnosis not present

## 2015-02-08 DIAGNOSIS — I4510 Unspecified right bundle-branch block: Secondary | ICD-10-CM | POA: Diagnosis not present

## 2015-02-08 DIAGNOSIS — I10 Essential (primary) hypertension: Secondary | ICD-10-CM | POA: Diagnosis not present

## 2015-02-08 DIAGNOSIS — G20 Parkinson's disease: Secondary | ICD-10-CM | POA: Diagnosis not present

## 2015-02-09 DIAGNOSIS — I10 Essential (primary) hypertension: Secondary | ICD-10-CM | POA: Diagnosis not present

## 2015-02-09 DIAGNOSIS — L89151 Pressure ulcer of sacral region, stage 1: Secondary | ICD-10-CM | POA: Diagnosis not present

## 2015-02-09 DIAGNOSIS — I4510 Unspecified right bundle-branch block: Secondary | ICD-10-CM | POA: Diagnosis not present

## 2015-02-09 DIAGNOSIS — G20 Parkinson's disease: Secondary | ICD-10-CM | POA: Diagnosis not present

## 2015-02-09 DIAGNOSIS — M1990 Unspecified osteoarthritis, unspecified site: Secondary | ICD-10-CM | POA: Diagnosis not present

## 2015-02-09 DIAGNOSIS — Z8673 Personal history of transient ischemic attack (TIA), and cerebral infarction without residual deficits: Secondary | ICD-10-CM | POA: Diagnosis not present

## 2015-02-12 DIAGNOSIS — M1990 Unspecified osteoarthritis, unspecified site: Secondary | ICD-10-CM | POA: Diagnosis not present

## 2015-02-12 DIAGNOSIS — I4510 Unspecified right bundle-branch block: Secondary | ICD-10-CM | POA: Diagnosis not present

## 2015-02-12 DIAGNOSIS — G20 Parkinson's disease: Secondary | ICD-10-CM | POA: Diagnosis not present

## 2015-02-12 DIAGNOSIS — I10 Essential (primary) hypertension: Secondary | ICD-10-CM | POA: Diagnosis not present

## 2015-02-12 DIAGNOSIS — Z8673 Personal history of transient ischemic attack (TIA), and cerebral infarction without residual deficits: Secondary | ICD-10-CM | POA: Diagnosis not present

## 2015-02-12 DIAGNOSIS — L89151 Pressure ulcer of sacral region, stage 1: Secondary | ICD-10-CM | POA: Diagnosis not present

## 2015-02-13 DIAGNOSIS — Z8673 Personal history of transient ischemic attack (TIA), and cerebral infarction without residual deficits: Secondary | ICD-10-CM | POA: Diagnosis not present

## 2015-02-13 DIAGNOSIS — I4510 Unspecified right bundle-branch block: Secondary | ICD-10-CM | POA: Diagnosis not present

## 2015-02-13 DIAGNOSIS — G20 Parkinson's disease: Secondary | ICD-10-CM | POA: Diagnosis not present

## 2015-02-13 DIAGNOSIS — M1990 Unspecified osteoarthritis, unspecified site: Secondary | ICD-10-CM | POA: Diagnosis not present

## 2015-02-13 DIAGNOSIS — I10 Essential (primary) hypertension: Secondary | ICD-10-CM | POA: Diagnosis not present

## 2015-02-13 DIAGNOSIS — L89151 Pressure ulcer of sacral region, stage 1: Secondary | ICD-10-CM | POA: Diagnosis not present

## 2015-02-14 DIAGNOSIS — Z8673 Personal history of transient ischemic attack (TIA), and cerebral infarction without residual deficits: Secondary | ICD-10-CM | POA: Diagnosis not present

## 2015-02-14 DIAGNOSIS — I10 Essential (primary) hypertension: Secondary | ICD-10-CM | POA: Diagnosis not present

## 2015-02-14 DIAGNOSIS — M1990 Unspecified osteoarthritis, unspecified site: Secondary | ICD-10-CM | POA: Diagnosis not present

## 2015-02-14 DIAGNOSIS — I4510 Unspecified right bundle-branch block: Secondary | ICD-10-CM | POA: Diagnosis not present

## 2015-02-14 DIAGNOSIS — L89151 Pressure ulcer of sacral region, stage 1: Secondary | ICD-10-CM | POA: Diagnosis not present

## 2015-02-14 DIAGNOSIS — G20 Parkinson's disease: Secondary | ICD-10-CM | POA: Diagnosis not present

## 2015-02-15 DIAGNOSIS — I10 Essential (primary) hypertension: Secondary | ICD-10-CM | POA: Diagnosis not present

## 2015-02-15 DIAGNOSIS — I4510 Unspecified right bundle-branch block: Secondary | ICD-10-CM | POA: Diagnosis not present

## 2015-02-15 DIAGNOSIS — M1990 Unspecified osteoarthritis, unspecified site: Secondary | ICD-10-CM | POA: Diagnosis not present

## 2015-02-15 DIAGNOSIS — Z8673 Personal history of transient ischemic attack (TIA), and cerebral infarction without residual deficits: Secondary | ICD-10-CM | POA: Diagnosis not present

## 2015-02-15 DIAGNOSIS — L89151 Pressure ulcer of sacral region, stage 1: Secondary | ICD-10-CM | POA: Diagnosis not present

## 2015-02-15 DIAGNOSIS — G20 Parkinson's disease: Secondary | ICD-10-CM | POA: Diagnosis not present

## 2015-02-16 DIAGNOSIS — L89151 Pressure ulcer of sacral region, stage 1: Secondary | ICD-10-CM | POA: Diagnosis not present

## 2015-02-16 DIAGNOSIS — Z8673 Personal history of transient ischemic attack (TIA), and cerebral infarction without residual deficits: Secondary | ICD-10-CM | POA: Diagnosis not present

## 2015-02-16 DIAGNOSIS — I4510 Unspecified right bundle-branch block: Secondary | ICD-10-CM | POA: Diagnosis not present

## 2015-02-16 DIAGNOSIS — M1990 Unspecified osteoarthritis, unspecified site: Secondary | ICD-10-CM | POA: Diagnosis not present

## 2015-02-16 DIAGNOSIS — I10 Essential (primary) hypertension: Secondary | ICD-10-CM | POA: Diagnosis not present

## 2015-02-16 DIAGNOSIS — G20 Parkinson's disease: Secondary | ICD-10-CM | POA: Diagnosis not present

## 2015-02-19 DIAGNOSIS — M1990 Unspecified osteoarthritis, unspecified site: Secondary | ICD-10-CM | POA: Diagnosis not present

## 2015-02-19 DIAGNOSIS — G20 Parkinson's disease: Secondary | ICD-10-CM | POA: Diagnosis not present

## 2015-02-19 DIAGNOSIS — I10 Essential (primary) hypertension: Secondary | ICD-10-CM | POA: Diagnosis not present

## 2015-02-19 DIAGNOSIS — L89151 Pressure ulcer of sacral region, stage 1: Secondary | ICD-10-CM | POA: Diagnosis not present

## 2015-02-19 DIAGNOSIS — I4510 Unspecified right bundle-branch block: Secondary | ICD-10-CM | POA: Diagnosis not present

## 2015-02-19 DIAGNOSIS — Z8673 Personal history of transient ischemic attack (TIA), and cerebral infarction without residual deficits: Secondary | ICD-10-CM | POA: Diagnosis not present

## 2015-02-20 DIAGNOSIS — Z8673 Personal history of transient ischemic attack (TIA), and cerebral infarction without residual deficits: Secondary | ICD-10-CM | POA: Diagnosis not present

## 2015-02-20 DIAGNOSIS — L89151 Pressure ulcer of sacral region, stage 1: Secondary | ICD-10-CM | POA: Diagnosis not present

## 2015-02-20 DIAGNOSIS — I4510 Unspecified right bundle-branch block: Secondary | ICD-10-CM | POA: Diagnosis not present

## 2015-02-20 DIAGNOSIS — I10 Essential (primary) hypertension: Secondary | ICD-10-CM | POA: Diagnosis not present

## 2015-02-20 DIAGNOSIS — G20 Parkinson's disease: Secondary | ICD-10-CM | POA: Diagnosis not present

## 2015-02-20 DIAGNOSIS — M1990 Unspecified osteoarthritis, unspecified site: Secondary | ICD-10-CM | POA: Diagnosis not present

## 2015-02-21 ENCOUNTER — Telehealth (INDEPENDENT_AMBULATORY_CARE_PROVIDER_SITE_OTHER): Payer: Self-pay | Admitting: *Deleted

## 2015-02-21 DIAGNOSIS — L89151 Pressure ulcer of sacral region, stage 1: Secondary | ICD-10-CM | POA: Diagnosis not present

## 2015-02-21 DIAGNOSIS — I4510 Unspecified right bundle-branch block: Secondary | ICD-10-CM | POA: Diagnosis not present

## 2015-02-21 DIAGNOSIS — Z8673 Personal history of transient ischemic attack (TIA), and cerebral infarction without residual deficits: Secondary | ICD-10-CM | POA: Diagnosis not present

## 2015-02-21 DIAGNOSIS — R197 Diarrhea, unspecified: Secondary | ICD-10-CM

## 2015-02-21 DIAGNOSIS — I10 Essential (primary) hypertension: Secondary | ICD-10-CM | POA: Diagnosis not present

## 2015-02-21 DIAGNOSIS — M1990 Unspecified osteoarthritis, unspecified site: Secondary | ICD-10-CM | POA: Diagnosis not present

## 2015-02-21 DIAGNOSIS — G20 Parkinson's disease: Secondary | ICD-10-CM | POA: Diagnosis not present

## 2015-02-21 NOTE — Telephone Encounter (Signed)
Stephen Bates has a bed sore on his bottom and having diarrhea. Dr. Willey Blade gave him Diphen/Atropnie and has it filled twice. This medicine is not working. Dr. Willey Blade told Stephen Bates he didn't want Stephen Bates taking anymore of this medicine that she needs to call Dr. Laural Golden. Her return phone number is [phone]. Patient doesn't walk. She does have a lift wheelchair for him.

## 2015-02-21 NOTE — Telephone Encounter (Signed)
GI pathogien, Imodium BID

## 2015-02-21 NOTE — Telephone Encounter (Signed)
Stephen Bates was called and made aware. She plans to come and get the containers today and get them back to lab as soon as possible. She will also get the Imodium 2 mg tablet over the counter and give husband 1 by mouth in the morning and 1 by mouth in the evening as recommended by Lelon Perla.

## 2015-02-21 NOTE — Telephone Encounter (Signed)
Noted, forwarding to Lelon Perla to review and for recommendations.

## 2015-02-22 DIAGNOSIS — I10 Essential (primary) hypertension: Secondary | ICD-10-CM | POA: Diagnosis not present

## 2015-02-22 DIAGNOSIS — G20 Parkinson's disease: Secondary | ICD-10-CM | POA: Diagnosis not present

## 2015-02-22 DIAGNOSIS — L89151 Pressure ulcer of sacral region, stage 1: Secondary | ICD-10-CM | POA: Diagnosis not present

## 2015-02-22 DIAGNOSIS — M1990 Unspecified osteoarthritis, unspecified site: Secondary | ICD-10-CM | POA: Diagnosis not present

## 2015-02-22 DIAGNOSIS — Z8673 Personal history of transient ischemic attack (TIA), and cerebral infarction without residual deficits: Secondary | ICD-10-CM | POA: Diagnosis not present

## 2015-02-22 DIAGNOSIS — I4510 Unspecified right bundle-branch block: Secondary | ICD-10-CM | POA: Diagnosis not present

## 2015-02-23 DIAGNOSIS — Z8673 Personal history of transient ischemic attack (TIA), and cerebral infarction without residual deficits: Secondary | ICD-10-CM | POA: Diagnosis not present

## 2015-02-23 DIAGNOSIS — G20 Parkinson's disease: Secondary | ICD-10-CM | POA: Diagnosis not present

## 2015-02-23 DIAGNOSIS — I10 Essential (primary) hypertension: Secondary | ICD-10-CM | POA: Diagnosis not present

## 2015-02-23 DIAGNOSIS — I4510 Unspecified right bundle-branch block: Secondary | ICD-10-CM | POA: Diagnosis not present

## 2015-02-23 DIAGNOSIS — L89151 Pressure ulcer of sacral region, stage 1: Secondary | ICD-10-CM | POA: Diagnosis not present

## 2015-02-23 DIAGNOSIS — M1990 Unspecified osteoarthritis, unspecified site: Secondary | ICD-10-CM | POA: Diagnosis not present

## 2015-02-23 LAB — GASTROINTESTINAL PATHOGEN PANEL PCR
C. DIFFICILE TOX A/B, PCR: NEGATIVE
Campylobacter, PCR: NEGATIVE
Cryptosporidium, PCR: NEGATIVE
E coli (ETEC) LT/ST PCR: NEGATIVE
E coli (STEC) stx1/stx2, PCR: NEGATIVE
E coli 0157, PCR: NEGATIVE
Giardia lamblia, PCR: NEGATIVE
Norovirus, PCR: NEGATIVE
ROTAVIRUS, PCR: NEGATIVE
SALMONELLA, PCR: NEGATIVE
Shigella, PCR: NEGATIVE

## 2015-02-26 DIAGNOSIS — Z8673 Personal history of transient ischemic attack (TIA), and cerebral infarction without residual deficits: Secondary | ICD-10-CM | POA: Diagnosis not present

## 2015-02-26 DIAGNOSIS — M1990 Unspecified osteoarthritis, unspecified site: Secondary | ICD-10-CM | POA: Diagnosis not present

## 2015-02-26 DIAGNOSIS — L89151 Pressure ulcer of sacral region, stage 1: Secondary | ICD-10-CM | POA: Diagnosis not present

## 2015-02-26 DIAGNOSIS — I4510 Unspecified right bundle-branch block: Secondary | ICD-10-CM | POA: Diagnosis not present

## 2015-02-26 DIAGNOSIS — I10 Essential (primary) hypertension: Secondary | ICD-10-CM | POA: Diagnosis not present

## 2015-02-26 DIAGNOSIS — G20 Parkinson's disease: Secondary | ICD-10-CM | POA: Diagnosis not present

## 2015-02-28 DIAGNOSIS — I10 Essential (primary) hypertension: Secondary | ICD-10-CM | POA: Diagnosis not present

## 2015-02-28 DIAGNOSIS — I4510 Unspecified right bundle-branch block: Secondary | ICD-10-CM | POA: Diagnosis not present

## 2015-02-28 DIAGNOSIS — Z8673 Personal history of transient ischemic attack (TIA), and cerebral infarction without residual deficits: Secondary | ICD-10-CM | POA: Diagnosis not present

## 2015-02-28 DIAGNOSIS — M1990 Unspecified osteoarthritis, unspecified site: Secondary | ICD-10-CM | POA: Diagnosis not present

## 2015-02-28 DIAGNOSIS — G20 Parkinson's disease: Secondary | ICD-10-CM | POA: Diagnosis not present

## 2015-02-28 DIAGNOSIS — L89151 Pressure ulcer of sacral region, stage 1: Secondary | ICD-10-CM | POA: Diagnosis not present

## 2015-03-01 DIAGNOSIS — I10 Essential (primary) hypertension: Secondary | ICD-10-CM | POA: Diagnosis not present

## 2015-03-01 DIAGNOSIS — M1990 Unspecified osteoarthritis, unspecified site: Secondary | ICD-10-CM | POA: Diagnosis not present

## 2015-03-01 DIAGNOSIS — G20 Parkinson's disease: Secondary | ICD-10-CM | POA: Diagnosis not present

## 2015-03-01 DIAGNOSIS — I4510 Unspecified right bundle-branch block: Secondary | ICD-10-CM | POA: Diagnosis not present

## 2015-03-01 DIAGNOSIS — Z8673 Personal history of transient ischemic attack (TIA), and cerebral infarction without residual deficits: Secondary | ICD-10-CM | POA: Diagnosis not present

## 2015-03-01 DIAGNOSIS — L89151 Pressure ulcer of sacral region, stage 1: Secondary | ICD-10-CM | POA: Diagnosis not present

## 2015-03-02 DIAGNOSIS — M1990 Unspecified osteoarthritis, unspecified site: Secondary | ICD-10-CM | POA: Diagnosis not present

## 2015-03-02 DIAGNOSIS — G20 Parkinson's disease: Secondary | ICD-10-CM | POA: Diagnosis not present

## 2015-03-02 DIAGNOSIS — I10 Essential (primary) hypertension: Secondary | ICD-10-CM | POA: Diagnosis not present

## 2015-03-02 DIAGNOSIS — Z8673 Personal history of transient ischemic attack (TIA), and cerebral infarction without residual deficits: Secondary | ICD-10-CM | POA: Diagnosis not present

## 2015-03-02 DIAGNOSIS — I4510 Unspecified right bundle-branch block: Secondary | ICD-10-CM | POA: Diagnosis not present

## 2015-03-02 DIAGNOSIS — L89151 Pressure ulcer of sacral region, stage 1: Secondary | ICD-10-CM | POA: Diagnosis not present

## 2015-03-05 DIAGNOSIS — M1990 Unspecified osteoarthritis, unspecified site: Secondary | ICD-10-CM | POA: Diagnosis not present

## 2015-03-05 DIAGNOSIS — G20 Parkinson's disease: Secondary | ICD-10-CM | POA: Diagnosis not present

## 2015-03-05 DIAGNOSIS — Z8673 Personal history of transient ischemic attack (TIA), and cerebral infarction without residual deficits: Secondary | ICD-10-CM | POA: Diagnosis not present

## 2015-03-05 DIAGNOSIS — I4510 Unspecified right bundle-branch block: Secondary | ICD-10-CM | POA: Diagnosis not present

## 2015-03-05 DIAGNOSIS — L89151 Pressure ulcer of sacral region, stage 1: Secondary | ICD-10-CM | POA: Diagnosis not present

## 2015-03-05 DIAGNOSIS — I10 Essential (primary) hypertension: Secondary | ICD-10-CM | POA: Diagnosis not present

## 2015-03-07 DIAGNOSIS — I4510 Unspecified right bundle-branch block: Secondary | ICD-10-CM | POA: Diagnosis not present

## 2015-03-07 DIAGNOSIS — I10 Essential (primary) hypertension: Secondary | ICD-10-CM | POA: Diagnosis not present

## 2015-03-07 DIAGNOSIS — L89151 Pressure ulcer of sacral region, stage 1: Secondary | ICD-10-CM | POA: Diagnosis not present

## 2015-03-07 DIAGNOSIS — Z8673 Personal history of transient ischemic attack (TIA), and cerebral infarction without residual deficits: Secondary | ICD-10-CM | POA: Diagnosis not present

## 2015-03-07 DIAGNOSIS — G20 Parkinson's disease: Secondary | ICD-10-CM | POA: Diagnosis not present

## 2015-03-07 DIAGNOSIS — M1990 Unspecified osteoarthritis, unspecified site: Secondary | ICD-10-CM | POA: Diagnosis not present

## 2015-03-09 DIAGNOSIS — M1990 Unspecified osteoarthritis, unspecified site: Secondary | ICD-10-CM | POA: Diagnosis not present

## 2015-03-09 DIAGNOSIS — G20 Parkinson's disease: Secondary | ICD-10-CM | POA: Diagnosis not present

## 2015-03-09 DIAGNOSIS — I4510 Unspecified right bundle-branch block: Secondary | ICD-10-CM | POA: Diagnosis not present

## 2015-03-09 DIAGNOSIS — L89151 Pressure ulcer of sacral region, stage 1: Secondary | ICD-10-CM | POA: Diagnosis not present

## 2015-03-09 DIAGNOSIS — I10 Essential (primary) hypertension: Secondary | ICD-10-CM | POA: Diagnosis not present

## 2015-03-09 DIAGNOSIS — Z8673 Personal history of transient ischemic attack (TIA), and cerebral infarction without residual deficits: Secondary | ICD-10-CM | POA: Diagnosis not present

## 2015-03-12 DIAGNOSIS — L89151 Pressure ulcer of sacral region, stage 1: Secondary | ICD-10-CM | POA: Diagnosis not present

## 2015-03-12 DIAGNOSIS — I10 Essential (primary) hypertension: Secondary | ICD-10-CM | POA: Diagnosis not present

## 2015-03-12 DIAGNOSIS — Z8673 Personal history of transient ischemic attack (TIA), and cerebral infarction without residual deficits: Secondary | ICD-10-CM | POA: Diagnosis not present

## 2015-03-12 DIAGNOSIS — G20 Parkinson's disease: Secondary | ICD-10-CM | POA: Diagnosis not present

## 2015-03-12 DIAGNOSIS — I4510 Unspecified right bundle-branch block: Secondary | ICD-10-CM | POA: Diagnosis not present

## 2015-03-12 DIAGNOSIS — M1990 Unspecified osteoarthritis, unspecified site: Secondary | ICD-10-CM | POA: Diagnosis not present

## 2015-03-14 DIAGNOSIS — I10 Essential (primary) hypertension: Secondary | ICD-10-CM | POA: Diagnosis not present

## 2015-03-14 DIAGNOSIS — Z8673 Personal history of transient ischemic attack (TIA), and cerebral infarction without residual deficits: Secondary | ICD-10-CM | POA: Diagnosis not present

## 2015-03-14 DIAGNOSIS — I4510 Unspecified right bundle-branch block: Secondary | ICD-10-CM | POA: Diagnosis not present

## 2015-03-14 DIAGNOSIS — M1990 Unspecified osteoarthritis, unspecified site: Secondary | ICD-10-CM | POA: Diagnosis not present

## 2015-03-14 DIAGNOSIS — L89151 Pressure ulcer of sacral region, stage 1: Secondary | ICD-10-CM | POA: Diagnosis not present

## 2015-03-14 DIAGNOSIS — G20 Parkinson's disease: Secondary | ICD-10-CM | POA: Diagnosis not present

## 2015-03-15 DIAGNOSIS — L89151 Pressure ulcer of sacral region, stage 1: Secondary | ICD-10-CM | POA: Diagnosis not present

## 2015-03-15 DIAGNOSIS — M1990 Unspecified osteoarthritis, unspecified site: Secondary | ICD-10-CM | POA: Diagnosis not present

## 2015-03-15 DIAGNOSIS — Z8673 Personal history of transient ischemic attack (TIA), and cerebral infarction without residual deficits: Secondary | ICD-10-CM | POA: Diagnosis not present

## 2015-03-15 DIAGNOSIS — I4510 Unspecified right bundle-branch block: Secondary | ICD-10-CM | POA: Diagnosis not present

## 2015-03-15 DIAGNOSIS — I10 Essential (primary) hypertension: Secondary | ICD-10-CM | POA: Diagnosis not present

## 2015-03-15 DIAGNOSIS — G20 Parkinson's disease: Secondary | ICD-10-CM | POA: Diagnosis not present

## 2015-03-16 DIAGNOSIS — G20 Parkinson's disease: Secondary | ICD-10-CM | POA: Diagnosis not present

## 2015-03-16 DIAGNOSIS — I4510 Unspecified right bundle-branch block: Secondary | ICD-10-CM | POA: Diagnosis not present

## 2015-03-16 DIAGNOSIS — L89151 Pressure ulcer of sacral region, stage 1: Secondary | ICD-10-CM | POA: Diagnosis not present

## 2015-03-16 DIAGNOSIS — Z8673 Personal history of transient ischemic attack (TIA), and cerebral infarction without residual deficits: Secondary | ICD-10-CM | POA: Diagnosis not present

## 2015-03-16 DIAGNOSIS — M1990 Unspecified osteoarthritis, unspecified site: Secondary | ICD-10-CM | POA: Diagnosis not present

## 2015-03-16 DIAGNOSIS — I10 Essential (primary) hypertension: Secondary | ICD-10-CM | POA: Diagnosis not present

## 2015-03-19 ENCOUNTER — Telehealth (INDEPENDENT_AMBULATORY_CARE_PROVIDER_SITE_OTHER): Payer: Self-pay | Admitting: *Deleted

## 2015-03-19 DIAGNOSIS — L89151 Pressure ulcer of sacral region, stage 1: Secondary | ICD-10-CM | POA: Diagnosis not present

## 2015-03-19 DIAGNOSIS — I10 Essential (primary) hypertension: Secondary | ICD-10-CM | POA: Diagnosis not present

## 2015-03-19 DIAGNOSIS — M1990 Unspecified osteoarthritis, unspecified site: Secondary | ICD-10-CM | POA: Diagnosis not present

## 2015-03-19 DIAGNOSIS — G20 Parkinson's disease: Secondary | ICD-10-CM | POA: Diagnosis not present

## 2015-03-19 DIAGNOSIS — I4510 Unspecified right bundle-branch block: Secondary | ICD-10-CM | POA: Diagnosis not present

## 2015-03-19 DIAGNOSIS — Z8673 Personal history of transient ischemic attack (TIA), and cerebral infarction without residual deficits: Secondary | ICD-10-CM | POA: Diagnosis not present

## 2015-03-19 NOTE — Telephone Encounter (Signed)
I talked with [name]. She states that had given [name] 30 mg = 6 tsps today. She says that he is also taking Fiber 2 a day. When [place] stops the Imodium he starts back with the diarrhea. [name] was advised that this would be reviewed with Dr.[name] on 03-20-15, and we would call her back.

## 2015-03-19 NOTE — Telephone Encounter (Signed)
Mearl Latin, daughter, said Stephen Bates is currently taking 2 Imodium daily and when he stops the diarrhea comes back. Is it okay to continue the Imodium on a daily bases. Please call her mother Stephen Bates at [phone].

## 2015-03-20 DIAGNOSIS — G20 Parkinson's disease: Secondary | ICD-10-CM | POA: Diagnosis not present

## 2015-03-20 DIAGNOSIS — M1990 Unspecified osteoarthritis, unspecified site: Secondary | ICD-10-CM | POA: Diagnosis not present

## 2015-03-20 DIAGNOSIS — Z8673 Personal history of transient ischemic attack (TIA), and cerebral infarction without residual deficits: Secondary | ICD-10-CM | POA: Diagnosis not present

## 2015-03-20 DIAGNOSIS — I4510 Unspecified right bundle-branch block: Secondary | ICD-10-CM | POA: Diagnosis not present

## 2015-03-20 DIAGNOSIS — I10 Essential (primary) hypertension: Secondary | ICD-10-CM | POA: Diagnosis not present

## 2015-03-20 DIAGNOSIS — L89151 Pressure ulcer of sacral region, stage 1: Secondary | ICD-10-CM | POA: Diagnosis not present

## 2015-03-21 DIAGNOSIS — I10 Essential (primary) hypertension: Secondary | ICD-10-CM | POA: Diagnosis not present

## 2015-03-21 DIAGNOSIS — I4510 Unspecified right bundle-branch block: Secondary | ICD-10-CM | POA: Diagnosis not present

## 2015-03-21 DIAGNOSIS — M1990 Unspecified osteoarthritis, unspecified site: Secondary | ICD-10-CM | POA: Diagnosis not present

## 2015-03-21 DIAGNOSIS — L89151 Pressure ulcer of sacral region, stage 1: Secondary | ICD-10-CM | POA: Diagnosis not present

## 2015-03-21 DIAGNOSIS — G20 Parkinson's disease: Secondary | ICD-10-CM | POA: Diagnosis not present

## 2015-03-21 DIAGNOSIS — Z8673 Personal history of transient ischemic attack (TIA), and cerebral infarction without residual deficits: Secondary | ICD-10-CM | POA: Diagnosis not present

## 2015-03-23 DIAGNOSIS — M1990 Unspecified osteoarthritis, unspecified site: Secondary | ICD-10-CM | POA: Diagnosis not present

## 2015-03-23 DIAGNOSIS — G20 Parkinson's disease: Secondary | ICD-10-CM | POA: Diagnosis not present

## 2015-03-23 DIAGNOSIS — I10 Essential (primary) hypertension: Secondary | ICD-10-CM | POA: Diagnosis not present

## 2015-03-23 DIAGNOSIS — I4510 Unspecified right bundle-branch block: Secondary | ICD-10-CM | POA: Diagnosis not present

## 2015-03-23 DIAGNOSIS — L89151 Pressure ulcer of sacral region, stage 1: Secondary | ICD-10-CM | POA: Diagnosis not present

## 2015-03-23 DIAGNOSIS — Z8673 Personal history of transient ischemic attack (TIA), and cerebral infarction without residual deficits: Secondary | ICD-10-CM | POA: Diagnosis not present

## 2015-03-26 DIAGNOSIS — G20 Parkinson's disease: Secondary | ICD-10-CM | POA: Diagnosis not present

## 2015-03-26 DIAGNOSIS — Z8673 Personal history of transient ischemic attack (TIA), and cerebral infarction without residual deficits: Secondary | ICD-10-CM | POA: Diagnosis not present

## 2015-03-26 DIAGNOSIS — I10 Essential (primary) hypertension: Secondary | ICD-10-CM | POA: Diagnosis not present

## 2015-03-26 DIAGNOSIS — L89151 Pressure ulcer of sacral region, stage 1: Secondary | ICD-10-CM | POA: Diagnosis not present

## 2015-03-26 DIAGNOSIS — I4510 Unspecified right bundle-branch block: Secondary | ICD-10-CM | POA: Diagnosis not present

## 2015-03-26 DIAGNOSIS — M1990 Unspecified osteoarthritis, unspecified site: Secondary | ICD-10-CM | POA: Diagnosis not present

## 2015-03-28 DIAGNOSIS — M1990 Unspecified osteoarthritis, unspecified site: Secondary | ICD-10-CM | POA: Diagnosis not present

## 2015-03-28 DIAGNOSIS — Z8673 Personal history of transient ischemic attack (TIA), and cerebral infarction without residual deficits: Secondary | ICD-10-CM | POA: Diagnosis not present

## 2015-03-28 DIAGNOSIS — I10 Essential (primary) hypertension: Secondary | ICD-10-CM | POA: Diagnosis not present

## 2015-03-28 DIAGNOSIS — I4510 Unspecified right bundle-branch block: Secondary | ICD-10-CM | POA: Diagnosis not present

## 2015-03-28 DIAGNOSIS — G20 Parkinson's disease: Secondary | ICD-10-CM | POA: Diagnosis not present

## 2015-03-28 DIAGNOSIS — L89151 Pressure ulcer of sacral region, stage 1: Secondary | ICD-10-CM | POA: Diagnosis not present

## 2015-03-29 DIAGNOSIS — M1990 Unspecified osteoarthritis, unspecified site: Secondary | ICD-10-CM | POA: Diagnosis not present

## 2015-03-29 DIAGNOSIS — Z8673 Personal history of transient ischemic attack (TIA), and cerebral infarction without residual deficits: Secondary | ICD-10-CM | POA: Diagnosis not present

## 2015-03-29 DIAGNOSIS — G20 Parkinson's disease: Secondary | ICD-10-CM | POA: Diagnosis not present

## 2015-03-29 DIAGNOSIS — L89151 Pressure ulcer of sacral region, stage 1: Secondary | ICD-10-CM | POA: Diagnosis not present

## 2015-03-29 DIAGNOSIS — I10 Essential (primary) hypertension: Secondary | ICD-10-CM | POA: Diagnosis not present

## 2015-03-29 DIAGNOSIS — I4510 Unspecified right bundle-branch block: Secondary | ICD-10-CM | POA: Diagnosis not present

## 2015-03-30 DIAGNOSIS — L89151 Pressure ulcer of sacral region, stage 1: Secondary | ICD-10-CM | POA: Diagnosis not present

## 2015-03-30 DIAGNOSIS — Z8673 Personal history of transient ischemic attack (TIA), and cerebral infarction without residual deficits: Secondary | ICD-10-CM | POA: Diagnosis not present

## 2015-03-30 DIAGNOSIS — I4510 Unspecified right bundle-branch block: Secondary | ICD-10-CM | POA: Diagnosis not present

## 2015-03-30 DIAGNOSIS — I10 Essential (primary) hypertension: Secondary | ICD-10-CM | POA: Diagnosis not present

## 2015-03-30 DIAGNOSIS — G20 Parkinson's disease: Secondary | ICD-10-CM | POA: Diagnosis not present

## 2015-03-30 DIAGNOSIS — M1990 Unspecified osteoarthritis, unspecified site: Secondary | ICD-10-CM | POA: Diagnosis not present

## 2015-04-02 DIAGNOSIS — N400 Enlarged prostate without lower urinary tract symptoms: Secondary | ICD-10-CM | POA: Diagnosis not present

## 2015-04-02 DIAGNOSIS — Z85828 Personal history of other malignant neoplasm of skin: Secondary | ICD-10-CM | POA: Diagnosis not present

## 2015-04-02 DIAGNOSIS — I10 Essential (primary) hypertension: Secondary | ICD-10-CM | POA: Diagnosis not present

## 2015-04-02 DIAGNOSIS — L89151 Pressure ulcer of sacral region, stage 1: Secondary | ICD-10-CM | POA: Diagnosis not present

## 2015-04-02 DIAGNOSIS — M1990 Unspecified osteoarthritis, unspecified site: Secondary | ICD-10-CM | POA: Diagnosis not present

## 2015-04-02 DIAGNOSIS — Z8673 Personal history of transient ischemic attack (TIA), and cerebral infarction without residual deficits: Secondary | ICD-10-CM | POA: Diagnosis not present

## 2015-04-02 DIAGNOSIS — Z8546 Personal history of malignant neoplasm of prostate: Secondary | ICD-10-CM | POA: Diagnosis not present

## 2015-04-02 DIAGNOSIS — I4510 Unspecified right bundle-branch block: Secondary | ICD-10-CM | POA: Diagnosis not present

## 2015-04-02 DIAGNOSIS — G20 Parkinson's disease: Secondary | ICD-10-CM | POA: Diagnosis not present

## 2015-04-02 DIAGNOSIS — E119 Type 2 diabetes mellitus without complications: Secondary | ICD-10-CM | POA: Diagnosis not present

## 2015-04-04 DIAGNOSIS — I4510 Unspecified right bundle-branch block: Secondary | ICD-10-CM | POA: Diagnosis not present

## 2015-04-04 DIAGNOSIS — Z8673 Personal history of transient ischemic attack (TIA), and cerebral infarction without residual deficits: Secondary | ICD-10-CM | POA: Diagnosis not present

## 2015-04-04 DIAGNOSIS — M1990 Unspecified osteoarthritis, unspecified site: Secondary | ICD-10-CM | POA: Diagnosis not present

## 2015-04-04 DIAGNOSIS — G20 Parkinson's disease: Secondary | ICD-10-CM | POA: Diagnosis not present

## 2015-04-04 DIAGNOSIS — L89151 Pressure ulcer of sacral region, stage 1: Secondary | ICD-10-CM | POA: Diagnosis not present

## 2015-04-04 DIAGNOSIS — I10 Essential (primary) hypertension: Secondary | ICD-10-CM | POA: Diagnosis not present

## 2015-04-05 NOTE — Telephone Encounter (Signed)
Per Dr.[name] the patient needs to be on a schedule. Patient's wife was advised of this when we first talked.

## 2015-04-06 DIAGNOSIS — Z8673 Personal history of transient ischemic attack (TIA), and cerebral infarction without residual deficits: Secondary | ICD-10-CM | POA: Diagnosis not present

## 2015-04-06 DIAGNOSIS — G20 Parkinson's disease: Secondary | ICD-10-CM | POA: Diagnosis not present

## 2015-04-06 DIAGNOSIS — I4510 Unspecified right bundle-branch block: Secondary | ICD-10-CM | POA: Diagnosis not present

## 2015-04-06 DIAGNOSIS — M1990 Unspecified osteoarthritis, unspecified site: Secondary | ICD-10-CM | POA: Diagnosis not present

## 2015-04-06 DIAGNOSIS — I10 Essential (primary) hypertension: Secondary | ICD-10-CM | POA: Diagnosis not present

## 2015-04-06 DIAGNOSIS — L89151 Pressure ulcer of sacral region, stage 1: Secondary | ICD-10-CM | POA: Diagnosis not present

## 2015-04-09 DIAGNOSIS — I10 Essential (primary) hypertension: Secondary | ICD-10-CM | POA: Diagnosis not present

## 2015-04-09 DIAGNOSIS — G20 Parkinson's disease: Secondary | ICD-10-CM | POA: Diagnosis not present

## 2015-04-09 DIAGNOSIS — M1990 Unspecified osteoarthritis, unspecified site: Secondary | ICD-10-CM | POA: Diagnosis not present

## 2015-04-09 DIAGNOSIS — L89151 Pressure ulcer of sacral region, stage 1: Secondary | ICD-10-CM | POA: Diagnosis not present

## 2015-04-09 DIAGNOSIS — Z8673 Personal history of transient ischemic attack (TIA), and cerebral infarction without residual deficits: Secondary | ICD-10-CM | POA: Diagnosis not present

## 2015-04-09 DIAGNOSIS — I4510 Unspecified right bundle-branch block: Secondary | ICD-10-CM | POA: Diagnosis not present

## 2015-04-10 DIAGNOSIS — I10 Essential (primary) hypertension: Secondary | ICD-10-CM | POA: Diagnosis not present

## 2015-04-10 DIAGNOSIS — M1990 Unspecified osteoarthritis, unspecified site: Secondary | ICD-10-CM | POA: Diagnosis not present

## 2015-04-10 DIAGNOSIS — I4510 Unspecified right bundle-branch block: Secondary | ICD-10-CM | POA: Diagnosis not present

## 2015-04-10 DIAGNOSIS — L89151 Pressure ulcer of sacral region, stage 1: Secondary | ICD-10-CM | POA: Diagnosis not present

## 2015-04-10 DIAGNOSIS — Z8673 Personal history of transient ischemic attack (TIA), and cerebral infarction without residual deficits: Secondary | ICD-10-CM | POA: Diagnosis not present

## 2015-04-10 DIAGNOSIS — G20 Parkinson's disease: Secondary | ICD-10-CM | POA: Diagnosis not present

## 2015-04-11 DIAGNOSIS — L89151 Pressure ulcer of sacral region, stage 1: Secondary | ICD-10-CM | POA: Diagnosis not present

## 2015-04-11 DIAGNOSIS — I4510 Unspecified right bundle-branch block: Secondary | ICD-10-CM | POA: Diagnosis not present

## 2015-04-11 DIAGNOSIS — G20 Parkinson's disease: Secondary | ICD-10-CM | POA: Diagnosis not present

## 2015-04-11 DIAGNOSIS — I10 Essential (primary) hypertension: Secondary | ICD-10-CM | POA: Diagnosis not present

## 2015-04-11 DIAGNOSIS — M1990 Unspecified osteoarthritis, unspecified site: Secondary | ICD-10-CM | POA: Diagnosis not present

## 2015-04-11 DIAGNOSIS — Z8673 Personal history of transient ischemic attack (TIA), and cerebral infarction without residual deficits: Secondary | ICD-10-CM | POA: Diagnosis not present

## 2015-04-13 DIAGNOSIS — M1990 Unspecified osteoarthritis, unspecified site: Secondary | ICD-10-CM | POA: Diagnosis not present

## 2015-04-13 DIAGNOSIS — I4510 Unspecified right bundle-branch block: Secondary | ICD-10-CM | POA: Diagnosis not present

## 2015-04-13 DIAGNOSIS — I10 Essential (primary) hypertension: Secondary | ICD-10-CM | POA: Diagnosis not present

## 2015-04-13 DIAGNOSIS — G20 Parkinson's disease: Secondary | ICD-10-CM | POA: Diagnosis not present

## 2015-04-13 DIAGNOSIS — Z8673 Personal history of transient ischemic attack (TIA), and cerebral infarction without residual deficits: Secondary | ICD-10-CM | POA: Diagnosis not present

## 2015-04-13 DIAGNOSIS — L89151 Pressure ulcer of sacral region, stage 1: Secondary | ICD-10-CM | POA: Diagnosis not present

## 2015-04-16 DIAGNOSIS — Z8673 Personal history of transient ischemic attack (TIA), and cerebral infarction without residual deficits: Secondary | ICD-10-CM | POA: Diagnosis not present

## 2015-04-16 DIAGNOSIS — I10 Essential (primary) hypertension: Secondary | ICD-10-CM | POA: Diagnosis not present

## 2015-04-16 DIAGNOSIS — I4510 Unspecified right bundle-branch block: Secondary | ICD-10-CM | POA: Diagnosis not present

## 2015-04-16 DIAGNOSIS — G20 Parkinson's disease: Secondary | ICD-10-CM | POA: Diagnosis not present

## 2015-04-16 DIAGNOSIS — L89151 Pressure ulcer of sacral region, stage 1: Secondary | ICD-10-CM | POA: Diagnosis not present

## 2015-04-16 DIAGNOSIS — M1990 Unspecified osteoarthritis, unspecified site: Secondary | ICD-10-CM | POA: Diagnosis not present

## 2015-04-17 DIAGNOSIS — L89151 Pressure ulcer of sacral region, stage 1: Secondary | ICD-10-CM | POA: Diagnosis not present

## 2015-04-17 DIAGNOSIS — G20 Parkinson's disease: Secondary | ICD-10-CM | POA: Diagnosis not present

## 2015-04-17 DIAGNOSIS — M1990 Unspecified osteoarthritis, unspecified site: Secondary | ICD-10-CM | POA: Diagnosis not present

## 2015-04-17 DIAGNOSIS — I10 Essential (primary) hypertension: Secondary | ICD-10-CM | POA: Diagnosis not present

## 2015-04-17 DIAGNOSIS — I4510 Unspecified right bundle-branch block: Secondary | ICD-10-CM | POA: Diagnosis not present

## 2015-04-17 DIAGNOSIS — Z8673 Personal history of transient ischemic attack (TIA), and cerebral infarction without residual deficits: Secondary | ICD-10-CM | POA: Diagnosis not present

## 2015-04-18 DIAGNOSIS — M1990 Unspecified osteoarthritis, unspecified site: Secondary | ICD-10-CM | POA: Diagnosis not present

## 2015-04-18 DIAGNOSIS — L89151 Pressure ulcer of sacral region, stage 1: Secondary | ICD-10-CM | POA: Diagnosis not present

## 2015-04-18 DIAGNOSIS — I10 Essential (primary) hypertension: Secondary | ICD-10-CM | POA: Diagnosis not present

## 2015-04-18 DIAGNOSIS — Z8673 Personal history of transient ischemic attack (TIA), and cerebral infarction without residual deficits: Secondary | ICD-10-CM | POA: Diagnosis not present

## 2015-04-18 DIAGNOSIS — I4510 Unspecified right bundle-branch block: Secondary | ICD-10-CM | POA: Diagnosis not present

## 2015-04-18 DIAGNOSIS — G20 Parkinson's disease: Secondary | ICD-10-CM | POA: Diagnosis not present

## 2015-04-20 DIAGNOSIS — G20 Parkinson's disease: Secondary | ICD-10-CM | POA: Diagnosis not present

## 2015-04-20 DIAGNOSIS — L89151 Pressure ulcer of sacral region, stage 1: Secondary | ICD-10-CM | POA: Diagnosis not present

## 2015-04-20 DIAGNOSIS — I10 Essential (primary) hypertension: Secondary | ICD-10-CM | POA: Diagnosis not present

## 2015-04-20 DIAGNOSIS — M1990 Unspecified osteoarthritis, unspecified site: Secondary | ICD-10-CM | POA: Diagnosis not present

## 2015-04-20 DIAGNOSIS — I4510 Unspecified right bundle-branch block: Secondary | ICD-10-CM | POA: Diagnosis not present

## 2015-04-20 DIAGNOSIS — Z8673 Personal history of transient ischemic attack (TIA), and cerebral infarction without residual deficits: Secondary | ICD-10-CM | POA: Diagnosis not present

## 2015-04-23 DIAGNOSIS — Z8673 Personal history of transient ischemic attack (TIA), and cerebral infarction without residual deficits: Secondary | ICD-10-CM | POA: Diagnosis not present

## 2015-04-23 DIAGNOSIS — G20 Parkinson's disease: Secondary | ICD-10-CM | POA: Diagnosis not present

## 2015-04-23 DIAGNOSIS — I4510 Unspecified right bundle-branch block: Secondary | ICD-10-CM | POA: Diagnosis not present

## 2015-04-23 DIAGNOSIS — M1990 Unspecified osteoarthritis, unspecified site: Secondary | ICD-10-CM | POA: Diagnosis not present

## 2015-04-23 DIAGNOSIS — I10 Essential (primary) hypertension: Secondary | ICD-10-CM | POA: Diagnosis not present

## 2015-04-23 DIAGNOSIS — L89151 Pressure ulcer of sacral region, stage 1: Secondary | ICD-10-CM | POA: Diagnosis not present

## 2015-04-25 DIAGNOSIS — G20 Parkinson's disease: Secondary | ICD-10-CM | POA: Diagnosis not present

## 2015-04-25 DIAGNOSIS — I4510 Unspecified right bundle-branch block: Secondary | ICD-10-CM | POA: Diagnosis not present

## 2015-04-25 DIAGNOSIS — M1990 Unspecified osteoarthritis, unspecified site: Secondary | ICD-10-CM | POA: Diagnosis not present

## 2015-04-25 DIAGNOSIS — Z8673 Personal history of transient ischemic attack (TIA), and cerebral infarction without residual deficits: Secondary | ICD-10-CM | POA: Diagnosis not present

## 2015-04-25 DIAGNOSIS — L89151 Pressure ulcer of sacral region, stage 1: Secondary | ICD-10-CM | POA: Diagnosis not present

## 2015-04-25 DIAGNOSIS — I10 Essential (primary) hypertension: Secondary | ICD-10-CM | POA: Diagnosis not present

## 2015-04-26 DIAGNOSIS — I10 Essential (primary) hypertension: Secondary | ICD-10-CM | POA: Diagnosis not present

## 2015-04-26 DIAGNOSIS — M1990 Unspecified osteoarthritis, unspecified site: Secondary | ICD-10-CM | POA: Diagnosis not present

## 2015-04-26 DIAGNOSIS — G20 Parkinson's disease: Secondary | ICD-10-CM | POA: Diagnosis not present

## 2015-04-26 DIAGNOSIS — L89151 Pressure ulcer of sacral region, stage 1: Secondary | ICD-10-CM | POA: Diagnosis not present

## 2015-04-26 DIAGNOSIS — I4510 Unspecified right bundle-branch block: Secondary | ICD-10-CM | POA: Diagnosis not present

## 2015-04-26 DIAGNOSIS — Z8673 Personal history of transient ischemic attack (TIA), and cerebral infarction without residual deficits: Secondary | ICD-10-CM | POA: Diagnosis not present

## 2015-04-27 DIAGNOSIS — L89151 Pressure ulcer of sacral region, stage 1: Secondary | ICD-10-CM | POA: Diagnosis not present

## 2015-04-27 DIAGNOSIS — I10 Essential (primary) hypertension: Secondary | ICD-10-CM | POA: Diagnosis not present

## 2015-04-27 DIAGNOSIS — G20 Parkinson's disease: Secondary | ICD-10-CM | POA: Diagnosis not present

## 2015-04-27 DIAGNOSIS — I4510 Unspecified right bundle-branch block: Secondary | ICD-10-CM | POA: Diagnosis not present

## 2015-04-27 DIAGNOSIS — M1990 Unspecified osteoarthritis, unspecified site: Secondary | ICD-10-CM | POA: Diagnosis not present

## 2015-04-27 DIAGNOSIS — Z8673 Personal history of transient ischemic attack (TIA), and cerebral infarction without residual deficits: Secondary | ICD-10-CM | POA: Diagnosis not present

## 2015-04-30 DIAGNOSIS — M1990 Unspecified osteoarthritis, unspecified site: Secondary | ICD-10-CM | POA: Diagnosis not present

## 2015-04-30 DIAGNOSIS — I10 Essential (primary) hypertension: Secondary | ICD-10-CM | POA: Diagnosis not present

## 2015-04-30 DIAGNOSIS — L89151 Pressure ulcer of sacral region, stage 1: Secondary | ICD-10-CM | POA: Diagnosis not present

## 2015-04-30 DIAGNOSIS — G20 Parkinson's disease: Secondary | ICD-10-CM | POA: Diagnosis not present

## 2015-04-30 DIAGNOSIS — I4510 Unspecified right bundle-branch block: Secondary | ICD-10-CM | POA: Diagnosis not present

## 2015-04-30 DIAGNOSIS — Z8673 Personal history of transient ischemic attack (TIA), and cerebral infarction without residual deficits: Secondary | ICD-10-CM | POA: Diagnosis not present

## 2015-05-02 DIAGNOSIS — L89151 Pressure ulcer of sacral region, stage 1: Secondary | ICD-10-CM | POA: Diagnosis not present

## 2015-05-02 DIAGNOSIS — M1990 Unspecified osteoarthritis, unspecified site: Secondary | ICD-10-CM | POA: Diagnosis not present

## 2015-05-02 DIAGNOSIS — I4510 Unspecified right bundle-branch block: Secondary | ICD-10-CM | POA: Diagnosis not present

## 2015-05-02 DIAGNOSIS — Z8673 Personal history of transient ischemic attack (TIA), and cerebral infarction without residual deficits: Secondary | ICD-10-CM | POA: Diagnosis not present

## 2015-05-02 DIAGNOSIS — G20 Parkinson's disease: Secondary | ICD-10-CM | POA: Diagnosis not present

## 2015-05-02 DIAGNOSIS — I10 Essential (primary) hypertension: Secondary | ICD-10-CM | POA: Diagnosis not present

## 2015-05-04 DIAGNOSIS — L89151 Pressure ulcer of sacral region, stage 1: Secondary | ICD-10-CM | POA: Diagnosis not present

## 2015-05-04 DIAGNOSIS — G20 Parkinson's disease: Secondary | ICD-10-CM | POA: Diagnosis not present

## 2015-05-04 DIAGNOSIS — I4510 Unspecified right bundle-branch block: Secondary | ICD-10-CM | POA: Diagnosis not present

## 2015-05-04 DIAGNOSIS — M1990 Unspecified osteoarthritis, unspecified site: Secondary | ICD-10-CM | POA: Diagnosis not present

## 2015-05-04 DIAGNOSIS — Z8673 Personal history of transient ischemic attack (TIA), and cerebral infarction without residual deficits: Secondary | ICD-10-CM | POA: Diagnosis not present

## 2015-05-04 DIAGNOSIS — I10 Essential (primary) hypertension: Secondary | ICD-10-CM | POA: Diagnosis not present

## 2015-05-07 DIAGNOSIS — M1990 Unspecified osteoarthritis, unspecified site: Secondary | ICD-10-CM | POA: Diagnosis not present

## 2015-05-07 DIAGNOSIS — I4510 Unspecified right bundle-branch block: Secondary | ICD-10-CM | POA: Diagnosis not present

## 2015-05-07 DIAGNOSIS — I10 Essential (primary) hypertension: Secondary | ICD-10-CM | POA: Diagnosis not present

## 2015-05-07 DIAGNOSIS — L89151 Pressure ulcer of sacral region, stage 1: Secondary | ICD-10-CM | POA: Diagnosis not present

## 2015-05-07 DIAGNOSIS — G20 Parkinson's disease: Secondary | ICD-10-CM | POA: Diagnosis not present

## 2015-05-07 DIAGNOSIS — Z8673 Personal history of transient ischemic attack (TIA), and cerebral infarction without residual deficits: Secondary | ICD-10-CM | POA: Diagnosis not present

## 2015-05-09 DIAGNOSIS — Z8673 Personal history of transient ischemic attack (TIA), and cerebral infarction without residual deficits: Secondary | ICD-10-CM | POA: Diagnosis not present

## 2015-05-09 DIAGNOSIS — I4510 Unspecified right bundle-branch block: Secondary | ICD-10-CM | POA: Diagnosis not present

## 2015-05-09 DIAGNOSIS — G20 Parkinson's disease: Secondary | ICD-10-CM | POA: Diagnosis not present

## 2015-05-09 DIAGNOSIS — M1990 Unspecified osteoarthritis, unspecified site: Secondary | ICD-10-CM | POA: Diagnosis not present

## 2015-05-09 DIAGNOSIS — L89151 Pressure ulcer of sacral region, stage 1: Secondary | ICD-10-CM | POA: Diagnosis not present

## 2015-05-09 DIAGNOSIS — I10 Essential (primary) hypertension: Secondary | ICD-10-CM | POA: Diagnosis not present

## 2015-05-11 DIAGNOSIS — M1990 Unspecified osteoarthritis, unspecified site: Secondary | ICD-10-CM | POA: Diagnosis not present

## 2015-05-11 DIAGNOSIS — Z8673 Personal history of transient ischemic attack (TIA), and cerebral infarction without residual deficits: Secondary | ICD-10-CM | POA: Diagnosis not present

## 2015-05-11 DIAGNOSIS — L89151 Pressure ulcer of sacral region, stage 1: Secondary | ICD-10-CM | POA: Diagnosis not present

## 2015-05-11 DIAGNOSIS — I4510 Unspecified right bundle-branch block: Secondary | ICD-10-CM | POA: Diagnosis not present

## 2015-05-11 DIAGNOSIS — G20 Parkinson's disease: Secondary | ICD-10-CM | POA: Diagnosis not present

## 2015-05-11 DIAGNOSIS — I10 Essential (primary) hypertension: Secondary | ICD-10-CM | POA: Diagnosis not present

## 2015-05-14 DIAGNOSIS — I10 Essential (primary) hypertension: Secondary | ICD-10-CM | POA: Diagnosis not present

## 2015-05-14 DIAGNOSIS — M1990 Unspecified osteoarthritis, unspecified site: Secondary | ICD-10-CM | POA: Diagnosis not present

## 2015-05-14 DIAGNOSIS — L89151 Pressure ulcer of sacral region, stage 1: Secondary | ICD-10-CM | POA: Diagnosis not present

## 2015-05-14 DIAGNOSIS — G20 Parkinson's disease: Secondary | ICD-10-CM | POA: Diagnosis not present

## 2015-05-14 DIAGNOSIS — I4510 Unspecified right bundle-branch block: Secondary | ICD-10-CM | POA: Diagnosis not present

## 2015-05-14 DIAGNOSIS — Z8673 Personal history of transient ischemic attack (TIA), and cerebral infarction without residual deficits: Secondary | ICD-10-CM | POA: Diagnosis not present

## 2015-05-15 DIAGNOSIS — G20 Parkinson's disease: Secondary | ICD-10-CM | POA: Diagnosis not present

## 2015-05-15 DIAGNOSIS — I4510 Unspecified right bundle-branch block: Secondary | ICD-10-CM | POA: Diagnosis not present

## 2015-05-15 DIAGNOSIS — I10 Essential (primary) hypertension: Secondary | ICD-10-CM | POA: Diagnosis not present

## 2015-05-15 DIAGNOSIS — L89151 Pressure ulcer of sacral region, stage 1: Secondary | ICD-10-CM | POA: Diagnosis not present

## 2015-05-15 DIAGNOSIS — Z8673 Personal history of transient ischemic attack (TIA), and cerebral infarction without residual deficits: Secondary | ICD-10-CM | POA: Diagnosis not present

## 2015-05-15 DIAGNOSIS — M1990 Unspecified osteoarthritis, unspecified site: Secondary | ICD-10-CM | POA: Diagnosis not present

## 2015-05-16 DIAGNOSIS — L89151 Pressure ulcer of sacral region, stage 1: Secondary | ICD-10-CM | POA: Diagnosis not present

## 2015-05-16 DIAGNOSIS — I10 Essential (primary) hypertension: Secondary | ICD-10-CM | POA: Diagnosis not present

## 2015-05-16 DIAGNOSIS — Z8673 Personal history of transient ischemic attack (TIA), and cerebral infarction without residual deficits: Secondary | ICD-10-CM | POA: Diagnosis not present

## 2015-05-16 DIAGNOSIS — I4510 Unspecified right bundle-branch block: Secondary | ICD-10-CM | POA: Diagnosis not present

## 2015-05-16 DIAGNOSIS — M1990 Unspecified osteoarthritis, unspecified site: Secondary | ICD-10-CM | POA: Diagnosis not present

## 2015-05-16 DIAGNOSIS — G20 Parkinson's disease: Secondary | ICD-10-CM | POA: Diagnosis not present

## 2015-05-18 DIAGNOSIS — I4510 Unspecified right bundle-branch block: Secondary | ICD-10-CM | POA: Diagnosis not present

## 2015-05-18 DIAGNOSIS — L89151 Pressure ulcer of sacral region, stage 1: Secondary | ICD-10-CM | POA: Diagnosis not present

## 2015-05-18 DIAGNOSIS — Z8673 Personal history of transient ischemic attack (TIA), and cerebral infarction without residual deficits: Secondary | ICD-10-CM | POA: Diagnosis not present

## 2015-05-18 DIAGNOSIS — G20 Parkinson's disease: Secondary | ICD-10-CM | POA: Diagnosis not present

## 2015-05-18 DIAGNOSIS — M1990 Unspecified osteoarthritis, unspecified site: Secondary | ICD-10-CM | POA: Diagnosis not present

## 2015-05-18 DIAGNOSIS — I10 Essential (primary) hypertension: Secondary | ICD-10-CM | POA: Diagnosis not present

## 2015-05-21 DIAGNOSIS — I4510 Unspecified right bundle-branch block: Secondary | ICD-10-CM | POA: Diagnosis not present

## 2015-05-21 DIAGNOSIS — G20 Parkinson's disease: Secondary | ICD-10-CM | POA: Diagnosis not present

## 2015-05-21 DIAGNOSIS — Z8673 Personal history of transient ischemic attack (TIA), and cerebral infarction without residual deficits: Secondary | ICD-10-CM | POA: Diagnosis not present

## 2015-05-21 DIAGNOSIS — M1990 Unspecified osteoarthritis, unspecified site: Secondary | ICD-10-CM | POA: Diagnosis not present

## 2015-05-21 DIAGNOSIS — I10 Essential (primary) hypertension: Secondary | ICD-10-CM | POA: Diagnosis not present

## 2015-05-21 DIAGNOSIS — L89151 Pressure ulcer of sacral region, stage 1: Secondary | ICD-10-CM | POA: Diagnosis not present

## 2015-05-22 ENCOUNTER — Inpatient Hospital Stay (HOSPITAL_COMMUNITY): Payer: Medicare Other

## 2015-05-22 ENCOUNTER — Other Ambulatory Visit: Payer: Self-pay | Admitting: Internal Medicine

## 2015-05-22 ENCOUNTER — Encounter (HOSPITAL_COMMUNITY): Payer: Self-pay | Admitting: *Deleted

## 2015-05-22 ENCOUNTER — Inpatient Hospital Stay (HOSPITAL_COMMUNITY)
Admission: AD | Admit: 2015-05-22 | Discharge: 2015-05-25 | DRG: 433 | Disposition: A | Discharge status: Home / Self Care | Payer: Medicare Other | Source: Ambulatory Visit | Attending: Internal Medicine | Admitting: Internal Medicine

## 2015-05-22 DIAGNOSIS — E119 Type 2 diabetes mellitus without complications: Secondary | ICD-10-CM | POA: Diagnosis present

## 2015-05-22 DIAGNOSIS — Z800 Family history of malignant neoplasm of digestive organs: Secondary | ICD-10-CM

## 2015-05-22 DIAGNOSIS — R279 Unspecified lack of coordination: Secondary | ICD-10-CM | POA: Diagnosis not present

## 2015-05-22 DIAGNOSIS — C250 Malignant neoplasm of head of pancreas: Secondary | ICD-10-CM | POA: Diagnosis present

## 2015-05-22 DIAGNOSIS — K7689 Other specified diseases of liver: Secondary | ICD-10-CM | POA: Diagnosis not present

## 2015-05-22 DIAGNOSIS — L89152 Pressure ulcer of sacral region, stage 2: Secondary | ICD-10-CM | POA: Diagnosis present

## 2015-05-22 DIAGNOSIS — K869 Disease of pancreas, unspecified: Secondary | ICD-10-CM

## 2015-05-22 DIAGNOSIS — C61 Malignant neoplasm of prostate: Secondary | ICD-10-CM | POA: Diagnosis not present

## 2015-05-22 DIAGNOSIS — E440 Moderate protein-calorie malnutrition: Secondary | ICD-10-CM | POA: Insufficient documentation

## 2015-05-22 DIAGNOSIS — Z8052 Family history of malignant neoplasm of bladder: Secondary | ICD-10-CM | POA: Diagnosis not present

## 2015-05-22 DIAGNOSIS — Z23 Encounter for immunization: Secondary | ICD-10-CM | POA: Diagnosis not present

## 2015-05-22 DIAGNOSIS — Z993 Dependence on wheelchair: Secondary | ICD-10-CM | POA: Diagnosis not present

## 2015-05-22 DIAGNOSIS — R17 Unspecified jaundice: Secondary | ICD-10-CM | POA: Diagnosis not present

## 2015-05-22 DIAGNOSIS — K7010 Alcoholic hepatitis without ascites: Secondary | ICD-10-CM | POA: Diagnosis not present

## 2015-05-22 DIAGNOSIS — F0390 Unspecified dementia without behavioral disturbance: Secondary | ICD-10-CM | POA: Diagnosis present

## 2015-05-22 DIAGNOSIS — Z8546 Personal history of malignant neoplasm of prostate: Secondary | ICD-10-CM | POA: Diagnosis not present

## 2015-05-22 DIAGNOSIS — J90 Pleural effusion, not elsewhere classified: Secondary | ICD-10-CM

## 2015-05-22 DIAGNOSIS — I10 Essential (primary) hypertension: Secondary | ICD-10-CM | POA: Diagnosis present

## 2015-05-22 DIAGNOSIS — R1011 Right upper quadrant pain: Secondary | ICD-10-CM | POA: Diagnosis present

## 2015-05-22 DIAGNOSIS — Z7401 Bed confinement status: Secondary | ICD-10-CM | POA: Diagnosis not present

## 2015-05-22 DIAGNOSIS — K831 Obstruction of bile duct: Secondary | ICD-10-CM

## 2015-05-22 DIAGNOSIS — Z85828 Personal history of other malignant neoplasm of skin: Secondary | ICD-10-CM

## 2015-05-22 DIAGNOSIS — Z8673 Personal history of transient ischemic attack (TIA), and cerebral infarction without residual deficits: Secondary | ICD-10-CM

## 2015-05-22 DIAGNOSIS — E876 Hypokalemia: Secondary | ICD-10-CM | POA: Diagnosis present

## 2015-05-22 DIAGNOSIS — K8020 Calculus of gallbladder without cholecystitis without obstruction: Secondary | ICD-10-CM | POA: Diagnosis present

## 2015-05-22 DIAGNOSIS — Z6821 Body mass index (BMI) 21.0-21.9, adult: Secondary | ICD-10-CM

## 2015-05-22 DIAGNOSIS — Z87891 Personal history of nicotine dependence: Secondary | ICD-10-CM

## 2015-05-22 DIAGNOSIS — M1990 Unspecified osteoarthritis, unspecified site: Secondary | ICD-10-CM | POA: Diagnosis present

## 2015-05-22 DIAGNOSIS — L8990 Pressure ulcer of unspecified site, unspecified stage: Secondary | ICD-10-CM | POA: Insufficient documentation

## 2015-05-22 DIAGNOSIS — E871 Hypo-osmolality and hyponatremia: Secondary | ICD-10-CM | POA: Diagnosis present

## 2015-05-22 DIAGNOSIS — K8080 Other cholelithiasis without obstruction: Secondary | ICD-10-CM | POA: Diagnosis not present

## 2015-05-22 LAB — COMPREHENSIVE METABOLIC PANEL
ALBUMIN: 2.5 g/dL — AB (ref 3.5–5.0)
ALK PHOS: 459 U/L — AB (ref 38–126)
ALT: 221 U/L — ABNORMAL HIGH (ref 17–63)
ANION GAP: 7 (ref 5–15)
AST: 212 U/L — AB (ref 15–41)
BILIRUBIN TOTAL: 7.9 mg/dL — AB (ref 0.3–1.2)
BUN: 14 mg/dL (ref 6–20)
CALCIUM: 9 mg/dL (ref 8.9–10.3)
CO2: 27 mmol/L (ref 22–32)
Chloride: 96 mmol/L — ABNORMAL LOW (ref 101–111)
Creatinine, Ser: 0.31 mg/dL — ABNORMAL LOW (ref 0.61–1.24)
GFR calc Af Amer: >60 mL/min (ref >60)
GLUCOSE: 233 mg/dL — AB (ref 65–99)
POTASSIUM: 3.8 mmol/L (ref 3.5–5.1)
Sodium: 130 mmol/L — ABNORMAL LOW (ref 135–145)
Total Protein: 6.6 g/dL (ref 6.5–8.1)

## 2015-05-22 LAB — GLUCOSE, CAPILLARY: Glucose-Capillary: 212 mg/dL — ABNORMAL HIGH (ref 65–99)

## 2015-05-22 LAB — PROTIME-INR
INR: 1.03 (ref 0.00–1.49)
Prothrombin Time: 13.7 seconds (ref 11.6–15.2)

## 2015-05-22 MED ORDER — ALUM & MAG HYDROXIDE-SIMETH 200-200-20 MG/5ML PO SUSP: 30.0000 mL | Freq: Four times a day (QID) | ORAL | Status: DC | PRN

## 2015-05-22 MED ORDER — IOHEXOL 300 MG/ML  SOLN
100.0000 mL | Freq: Once | INTRAMUSCULAR | Status: AC | PRN
  Administered 2015-05-22: 100 mL via INTRAVENOUS

## 2015-05-22 MED ORDER — MIRTAZAPINE 30 MG PO TABS
30.0000 mg | ORAL_TABLET | Freq: Every day | ORAL | Status: DC
  Administered 2015-05-22 – 2015-05-24 (×3): 30 mg via ORAL
  Filled 2015-05-22 (×3): qty 1

## 2015-05-22 MED ORDER — INSULIN ASPART 100 UNIT/ML ~~LOC~~ SOLN
0.0000 [IU] | Freq: Every day | SUBCUTANEOUS | Status: DC
  Administered 2015-05-22 – 2015-05-24 (×3): 2 [IU] via SUBCUTANEOUS

## 2015-05-22 MED ORDER — INSULIN ASPART 100 UNIT/ML ~~LOC~~ SOLN
0.0000 [IU] | Freq: Three times a day (TID) | SUBCUTANEOUS | Status: DC
Start: 2015-05-23 — End: 2015-05-24
  Administered 2015-05-23: 2 [IU] via SUBCUTANEOUS
  Administered 2015-05-23: 3 [IU] via SUBCUTANEOUS
  Administered 2015-05-23: 2 [IU] via SUBCUTANEOUS
  Administered 2015-05-24: 3 [IU] via SUBCUTANEOUS

## 2015-05-22 MED ORDER — SODIUM CHLORIDE 0.9 % IJ SOLN: 3.0000 mL | INTRAMUSCULAR | Status: DC | PRN

## 2015-05-22 MED ORDER — ONDANSETRON HCL 4 MG PO TABS: 4.0000 mg | ORAL_TABLET | Freq: Four times a day (QID) | ORAL | Status: DC | PRN

## 2015-05-22 MED ORDER — ENSURE ENLIVE PO LIQD
237.0000 mL | Freq: Two times a day (BID) | ORAL | Status: DC
  Administered 2015-05-23 – 2015-05-25 (×5): 237 mL via ORAL

## 2015-05-22 MED ORDER — FOLIC ACID 1 MG PO TABS
1.0000 mg | ORAL_TABLET | Freq: Every day | ORAL | Status: DC
  Administered 2015-05-22 – 2015-05-25 (×3): 1 mg via ORAL
  Filled 2015-05-22 (×3): qty 1

## 2015-05-22 MED ORDER — ACETAMINOPHEN 650 MG RE SUPP: 650.0000 mg | Freq: Four times a day (QID) | RECTAL | Status: DC | PRN

## 2015-05-22 MED ORDER — INFLUENZA VAC SPLIT QUAD 0.5 ML IM SUSY
0.5000 mL | PREFILLED_SYRINGE | INTRAMUSCULAR | Status: AC
  Administered 2015-05-23: 0.5 mL via INTRAMUSCULAR
  Filled 2015-05-22: qty 0.5

## 2015-05-22 MED ORDER — ADULT MULTIVITAMIN W/MINERALS CH
1.0000 | ORAL_TABLET | Freq: Every day | ORAL | Status: DC
  Administered 2015-05-22 – 2015-05-25 (×3): 1 via ORAL
  Filled 2015-05-22 (×3): qty 1

## 2015-05-22 MED ORDER — ACETAMINOPHEN 325 MG PO TABS: 650.0000 mg | ORAL_TABLET | Freq: Four times a day (QID) | ORAL | Status: DC | PRN

## 2015-05-22 MED ORDER — THIAMINE HCL 100 MG/ML IJ SOLN
100.0000 mg | Freq: Every day | INTRAMUSCULAR | Status: DC
  Administered 2015-05-22 – 2015-05-25 (×3): 100 mg via INTRAVENOUS
  Filled 2015-05-22 (×3): qty 2

## 2015-05-22 MED ORDER — ONDANSETRON HCL 4 MG/2ML IJ SOLN: 4.0000 mg | Freq: Four times a day (QID) | INTRAMUSCULAR | Status: DC | PRN

## 2015-05-22 MED ORDER — SODIUM CHLORIDE 0.9 % IV SOLN: 250.0000 mL | INTRAVENOUS | Status: DC | PRN

## 2015-05-22 MED ORDER — SODIUM CHLORIDE 0.9 % IJ SOLN
3.0000 mL | Freq: Two times a day (BID) | INTRAMUSCULAR | Status: DC
  Administered 2015-05-22 – 2015-05-25 (×6): 3 mL via INTRAVENOUS

## 2015-05-23 ENCOUNTER — Inpatient Hospital Stay (HOSPITAL_COMMUNITY): Payer: Medicare Other

## 2015-05-23 DIAGNOSIS — K869 Disease of pancreas, unspecified: Secondary | ICD-10-CM

## 2015-05-23 DIAGNOSIS — K7689 Other specified diseases of liver: Secondary | ICD-10-CM

## 2015-05-23 DIAGNOSIS — L8990 Pressure ulcer of unspecified site, unspecified stage: Secondary | ICD-10-CM | POA: Insufficient documentation

## 2015-05-23 DIAGNOSIS — K7010 Alcoholic hepatitis without ascites: Secondary | ICD-10-CM | POA: Diagnosis not present

## 2015-05-23 DIAGNOSIS — R17 Unspecified jaundice: Secondary | ICD-10-CM

## 2015-05-23 LAB — GLUCOSE, CAPILLARY
GLUCOSE-CAPILLARY: 167 mg/dL — AB (ref 65–99)
GLUCOSE-CAPILLARY: 249 mg/dL — AB (ref 65–99)
Glucose-Capillary: 172 mg/dL — ABNORMAL HIGH (ref 65–99)
Glucose-Capillary: 234 mg/dL — ABNORMAL HIGH (ref 65–99)

## 2015-05-23 LAB — HEPATIC FUNCTION PANEL
ALK PHOS: 521 U/L — AB (ref 38–126)
ALT: 289 U/L — AB (ref 17–63)
AST: 364 U/L — AB (ref 15–41)
Albumin: 2.5 g/dL — ABNORMAL LOW (ref 3.5–5.0)
BILIRUBIN DIRECT: 5.4 mg/dL — AB (ref 0.1–0.5)
BILIRUBIN INDIRECT: 2.7 mg/dL — AB (ref 0.3–0.9)
TOTAL PROTEIN: 6.6 g/dL (ref 6.5–8.1)
Total Bilirubin: 8.1 mg/dL — ABNORMAL HIGH (ref 0.3–1.2)

## 2015-05-23 LAB — HEMOGLOBIN A1C
HEMOGLOBIN A1C: 6.3 % — AB (ref 4.8–5.6)
MEAN PLASMA GLUCOSE: 134 mg/dL

## 2015-05-23 LAB — PROTIME-INR
INR: 1.08 (ref 0.00–1.49)
PROTHROMBIN TIME: 14.2 s (ref 11.6–15.2)

## 2015-05-23 LAB — CBC
HEMATOCRIT: 35.1 % — AB (ref 39.0–52.0)
HEMOGLOBIN: 12.5 g/dL — AB (ref 13.0–17.0)
MCH: 35.7 pg — AB (ref 26.0–34.0)
MCHC: 35.6 g/dL (ref 30.0–36.0)
MCV: 100.3 fL — AB (ref 78.0–100.0)
Platelets: 339 10*3/uL (ref 150–400)
RBC: 3.5 MIL/uL — AB (ref 4.22–5.81)
RDW: 13 % (ref 11.5–15.5)
WBC: 11.2 10*3/uL — AB (ref 4.0–10.5)

## 2015-05-23 LAB — HEPATITIS B SURFACE ANTIGEN: Hepatitis B Surface Ag: NEGATIVE

## 2015-05-23 LAB — HEPATITIS C ANTIBODY: HCV Ab: <0.1 s/co ratio (ref 0.0–0.9)

## 2015-05-23 LAB — HEPATITIS A ANTIBODY, IGM: Hep A IgM: NEGATIVE

## 2015-05-23 MED ORDER — DEXTROSE 5 % IV SOLN
1.0000 g | Freq: Once | INTRAVENOUS | Status: AC
  Administered 2015-05-24: 1 g via INTRAVENOUS
  Filled 2015-05-23: qty 10

## 2015-05-23 MED ORDER — GADOBENATE DIMEGLUMINE 529 MG/ML IV SOLN
10.0000 mL | Freq: Once | INTRAVENOUS | Status: AC | PRN
  Administered 2015-05-23: 10 mL via INTRAVENOUS

## 2015-05-23 MED ORDER — SODIUM CHLORIDE 0.9 % IV SOLN
INTRAVENOUS | Status: DC
  Administered 2015-05-24: 06:00:00 via INTRAVENOUS

## 2015-05-23 NOTE — Progress Notes (Signed)
Initial Nutrition Assessment  DOCUMENTATION CODES:   Non-severe (moderate) malnutrition in context of acute illness/injury  INTERVENTION:  Ensure Enlive po BID, each supplement provides 350 kcal and 20 grams of protein    NUTRITION DIAGNOSIS:   Predicted suboptimal nutrient intake related to   limited meal pattern as evidenced by per patient/family report.  GOAL:   Patient will meet greater than or equal to 90% of their needs  MONITOR:   Supplement acceptance, Weight trends, Labs, PO intake  REASON FOR ASSESSMENT:   Malnutrition Screening Tool    ASSESSMENT: Pt is from home. He follows a regular diet and is able to feed himself. He appetite has decreased and currently his meal pattern consists of late breakfast and early dinner with no snacks between. He drinks prune juice and cranberry juice with egg and bacon for breakfast and cranberry juice with dinner which varies spaghetti, fish etc.  He says his usual weight is 145# but wife says it's been a couple of years since he weighed that (which is what hospital records show). He is in the bed mostly now and has muscle wasting and fat mass depletion. Provided edu for pt and spouse related to increasing protein and calorie intake daily and he is agreeable to drink Ensure between meals to boost calorie and protein consumption.  MRI shows abnormal pancreas Abnormal Labs: Alk phos-521, Albumin 2.5, AST 364, ALT 289, bilirubin 8.1  Diet Order:  Diet Carb Modified Fluid consistency:: Thin; Room service appropriate?: Yes  Skin:   stage II sacrum   Last BM:   11/15   Height:   Ht Readings from Last 1 Encounters:  05/22/15 5' 4" (1.626 m)    Weight:   Wt Readings from Last 1 Encounters:  05/23/15 126 lb 12.2 oz (57.5 kg)    Ideal Body Weight:  59 kg  BMI:  Body mass index is 21.75 kg/(m^2).  Estimated Nutritional Needs:   Kcal:  4944-9675   Protein:  87 gr  Fluid:  1.7-1.9 liters daily  EDUCATION NEEDS: provided edu  for pt and spouse related to increasing protein and calorie intake daily. Usual meal pattern late breakfast and early dinner with no snacks between.      Colman Cater MS,RD,CSG,LDN Office: [phone] Pager: [phone]

## 2015-05-23 NOTE — Care Management Note (Signed)
Case Management Note  Patient Details  Name: Stephen Bates MRN: [id number] Date of Birth: [birth year]  Subjective/Objective:                  Pt is from home, lives with his wife and has recently become dependent with ADL's. Pt was previously receiving HH PT for gait training and strengthening but was DC'd from PT services due to a lack of progress. PT was actively receiving RN and aid services through [facility] prior to admission. Pt mostly stays in his hospital bed but also has wheelchair and steady for transfers.   Action/Plan: Pt plans to return home with resumption of [place] services at DC. PT eval pending to determine needs at home for the wife to cont to care for him herself. Romualdo Bolk, of [facility], will be notified of admission and will obtain pt info from chart. CM will cont to follow for DC planning.   Expected Discharge Date:  05/25/15               Expected Discharge Plan:  [place]  In-House Referral:  NA  Discharge planning Services  CM Consult  Post Acute Care Choice:  Resumption of Svcs/PTA Provider Choice offered to:  Patient  DME Arranged:    DME Agency:     HH Arranged:  RN, PT, Nurse's Aide [place] Agency:  [facility]  Status of Service:  In process, will continue to follow  Medicare Important Message Given:    Date Medicare IM Given:    Medicare IM give by:    Date Additional Medicare IM Given:    Additional Medicare Important Message give by:     If discussed at [place] of Stay Meetings, dates discussed:    Additional Comments:  Sherald Barge, RN 05/23/2015, 11:36 AM

## 2015-05-23 NOTE — Progress Notes (Signed)
Subjective: Stephen Bates was admitted yesterday with jaundice. He denies any abdominal pain today after having had some right upper quadrant pain this past week. He has undergone ultrasound of the abdomen and CT scan of the abdomen and pelvis last night. His CT scan reveals a pancreatic head abnormality.  Objective: Vital signs in last 24 hours: Filed Vitals:   05/22/15 1330 05/22/15 2124 05/23/15 0603  BP: 124/58 116/71 132/53  Pulse: 92 98 82  Temp: 98.6 F (37 C) 98.5 F (36.9 C) 98.4 F (36.9 C)  TempSrc: Oral Oral Oral  Resp: 18 20 20   Height: 5\' 4"  (1.626 m)    Weight: 124 lb (56.246 kg)  126 lb 12.2 oz (57.5 kg)  SpO2: 96% 95% 97%   Weight change:   Intake/Output Summary (Last 24 hours) at 05/23/15 0734 Last data filed at 05/23/15 0604  Gross per 24 hour  Intake    240 ml  Output    800 ml  Net   -560 ml    Physical Exam: Alert. Comfortable appearing. Jaundiced. Lungs clear. Heart regular with no murmurs. Abdomen is nontender with no palpable organomegaly or mass. Extremities reveal no edema. Skin reveals a decubitus ulcer.  Lab Results:    Results for orders placed or performed during the hospital encounter of 05/22/15 (from the past 24 hour(s))  Comprehensive metabolic panel     Status: Abnormal   Collection Time: 05/22/15  2:20 PM  Result Value Ref Range   Sodium 130 (L) 135 - 145 mmol/L   Potassium 3.8 3.5 - 5.1 mmol/L   Chloride 96 (L) 101 - 111 mmol/L   CO2 27 22 - 32 mmol/L   Glucose, Bld 233 (H) 65 - 99 mg/dL   BUN 14 6 - 20 mg/dL   Creatinine, Ser 0.31 (L) 0.61 - 1.24 mg/dL   Calcium 9.0 8.9 - 10.3 mg/dL   Total Protein 6.6 6.5 - 8.1 g/dL   Albumin 2.5 (L) 3.5 - 5.0 g/dL   AST 212 (H) 15 - 41 U/L   ALT 221 (H) 17 - 63 U/L   Alkaline Phosphatase 459 (H) 38 - 126 U/L   Total Bilirubin 7.9 (H) 0.3 - 1.2 mg/dL   GFR calc non Af Amer >60 >60 mL/min   GFR calc Af Amer >60 >60 mL/min   Anion gap 7 5 - 15  Protime-INR     Status: None   Collection  Time: 05/22/15  2:20 PM  Result Value Ref Range   Prothrombin Time 13.7 11.6 - 15.2 seconds   INR 1.03 0.00 - 1.49  Hepatitis B surface antigen     Status: None   Collection Time: 05/22/15  6:26 PM  Result Value Ref Range   Hepatitis B Surface Ag Negative Negative  Glucose, capillary     Status: Abnormal   Collection Time: 05/22/15  9:23 PM  Result Value Ref Range   Glucose-Capillary 212 (H) 65 - 99 mg/dL   Comment 1 Notify RN    Comment 2 Document in Chart      ABGS No results for input(s): PHART, PO2ART, TCO2, HCO3 in the last 72 hours.  Invalid input(s): PCO2 CULTURES No results found for this or any previous visit (from the past 240 hour(s)). Studies/Results: US Abdomen Complete  05/22/2015  CLINICAL DATA:  Jaundice EXAM: ULTRASOUND ABDOMEN COMPLETE COMPARISON:  None. FINDINGS: Gallbladder: Well distended without evidence of definitive cholelithiasis. Echogenic material is noted within consistent with gallbladder sludge. A negative sonographic Murphy sign  is noted. Common bile duct: Diameter: 5 mm. Liver: No focal lesion identified. Within normal limits in parenchymal echogenicity. IVC: Not well visualized due to overlying bowel gas. Pancreas: Not well visualized due to overlying bowel gas. Spleen: Size and appearance within normal limits. Right Kidney: Length: 11.5 cm. 9 mm calcific density is noted which may represent a nonobstructing renal stone. Left Kidney: Length: 11.0 cm. Echogenicity within normal limits. No mass or hydronephrosis visualized. Abdominal aorta: No aneurysm visualized. Other findings: None. IMPRESSION: Calcification within the right kidney likely representing a nonobstructing renal stone. Gallbladder sludge. No other focal abnormality is seen. Electronically Signed   By: Inez Catalina M.D.   On: 05/22/2015 16:18   Ct Abdomen Pelvis W Contrast  05/22/2015  CLINICAL DATA:  Jaundice.  Prostate cancer. EXAM: CT ABDOMEN AND PELVIS WITH CONTRAST TECHNIQUE:  Multidetector CT imaging of the abdomen and pelvis was performed using the standard protocol following bolus administration of intravenous contrast. CONTRAST:  16mL OMNIPAQUE IOHEXOL 300 MG/ML  SOLN COMPARISON:  Abdominal sonogram from earlier today. FINDINGS: Lower chest: Small right pleural effusion with moderate passive right lower lobe atelectasis. Segmental basilar left lower lobe atelectasis. Left anterior descending, left circumflex and right coronary atherosclerosis. Hepatobiliary: Suggestion of diffuse hepatic steatosis. Hypodense 0.5 cm segment 2 left liver lobe lesion, too small to characterize. No additional liver lesions. There is slightly dense layering material in the fundal gallbladder, suggesting tiny gallstones. There is mild diffuse gallbladder wall thickening without pericholecystic fluid. There is no intrahepatic or extrahepatic biliary ductal dilatation. The common bile duct measures 3 mm diameter. Pancreas: There is a 1.6 x 1.3 x 2.8 cm lobulated cystic pancreatic lesion in the pancreatic head (series 3/ image 38 and series 5/image 45). No main pancreatic duct dilation. No additional pancreatic lesions. No peripancreatic fat stranding or fluid. Spleen: Normal size. No mass. Adrenals/Urinary Tract: Normal adrenals. There are a few hypodense subcentimeter renal lesions in both kidneys, too small to characterize. No hydronephrosis. Mild diffuse bladder wall thickening. No appreciable focal bladder abnormality. Stomach/Bowel: Grossly normal stomach. Normal caliber small bowel with no small bowel wall thickening. Normal appendix . Normal large bowel with no diverticulosis, large bowel wall thickening or pericolonic fat stranding. Vascular/Lymphatic: Atherosclerotic nonaneurysmal abdominal aorta. Patent portal, splenic, hepatic and renal veins. No pathologically enlarged lymph nodes in the abdomen or pelvis. Reproductive: Numerous brachytherapy seeds are seen within the normal size prostate. Other:  No pneumoperitoneum, ascites or focal fluid collection. Musculoskeletal: No aggressive appearing focal osseous lesions. Moderate to severe T11 and T12 and mild L1 chronic vertebral compression fractures status post vertebroplasty. Moderate degenerative changes throughout the visualized thoracolumbar spine. IMPRESSION: 1. Indeterminate lobulated cystic pancreatic lesion in the pancreatic head measuring 1.6 x 1.3 x 2.8 cm, neoplasm not excluded. No biliary ductal dilatation. No main pancreatic duct dilation. Recommend further characterization with MRI abdomen with and without intravenous contrast. 2. Solitary subcentimeter left liver lobe hypodense lesion, too small to characterize. Suggestion of diffuse hepatic steatosis. 3. Dense material layering in the fundal gallbladder, suggesting tiny gallstones. Nonspecific diffuse gallbladder wall thickening. No pericholecystic fluid. Findings are nonspecific, and there was no evidence of acute cholecystitis on the abdominal sonogram from earlier today. 4. Small right pleural effusion.  Moderate bibasilar atelectasis. 5. Three-vessel coronary atherosclerosis. 6. Mild diffuse bladder wall thickening, nonspecific, likely due to chronic bladder voiding dysfunction. Correlate with urinalysis to exclude acute cystitis. Electronically Signed   By: Ilona Sorrel M.D.   On: 05/22/2015 21:31   Micro Results:  No results found for this or any previous visit (from the past 240 hour(s)). Studies/Results: US Abdomen Complete  05/22/2015  CLINICAL DATA:  Jaundice EXAM: ULTRASOUND ABDOMEN COMPLETE COMPARISON:  None. FINDINGS: Gallbladder: Well distended without evidence of definitive cholelithiasis. Echogenic material is noted within consistent with gallbladder sludge. A negative sonographic Percell Miller sign is noted. Common bile duct: Diameter: 5 mm. Liver: No focal lesion identified. Within normal limits in parenchymal echogenicity. IVC: Not well visualized due to overlying bowel gas.  Pancreas: Not well visualized due to overlying bowel gas. Spleen: Size and appearance within normal limits. Right Kidney: Length: 11.5 cm. 9 mm calcific density is noted which may represent a nonobstructing renal stone. Left Kidney: Length: 11.0 cm. Echogenicity within normal limits. No mass or hydronephrosis visualized. Abdominal aorta: No aneurysm visualized. Other findings: None. IMPRESSION: Calcification within the right kidney likely representing a nonobstructing renal stone. Gallbladder sludge. No other focal abnormality is seen. Electronically Signed   By: Inez Catalina M.D.   On: 05/22/2015 16:18   Ct Abdomen Pelvis W Contrast  05/22/2015  CLINICAL DATA:  Jaundice.  Prostate cancer. EXAM: CT ABDOMEN AND PELVIS WITH CONTRAST TECHNIQUE: Multidetector CT imaging of the abdomen and pelvis was performed using the standard protocol following bolus administration of intravenous contrast. CONTRAST:  166mL OMNIPAQUE IOHEXOL 300 MG/ML  SOLN COMPARISON:  Abdominal sonogram from earlier today. FINDINGS: Lower chest: Small right pleural effusion with moderate passive right lower lobe atelectasis. Segmental basilar left lower lobe atelectasis. Left anterior descending, left circumflex and right coronary atherosclerosis. Hepatobiliary: Suggestion of diffuse hepatic steatosis. Hypodense 0.5 cm segment 2 left liver lobe lesion, too small to characterize. No additional liver lesions. There is slightly dense layering material in the fundal gallbladder, suggesting tiny gallstones. There is mild diffuse gallbladder wall thickening without pericholecystic fluid. There is no intrahepatic or extrahepatic biliary ductal dilatation. The common bile duct measures 3 mm diameter. Pancreas: There is a 1.6 x 1.3 x 2.8 cm lobulated cystic pancreatic lesion in the pancreatic head (series 3/ image 38 and series 5/image 45). No main pancreatic duct dilation. No additional pancreatic lesions. No peripancreatic fat stranding or fluid.  Spleen: Normal size. No mass. Adrenals/Urinary Tract: Normal adrenals. There are a few hypodense subcentimeter renal lesions in both kidneys, too small to characterize. No hydronephrosis. Mild diffuse bladder wall thickening. No appreciable focal bladder abnormality. Stomach/Bowel: Grossly normal stomach. Normal caliber small bowel with no small bowel wall thickening. Normal appendix . Normal large bowel with no diverticulosis, large bowel wall thickening or pericolonic fat stranding. Vascular/Lymphatic: Atherosclerotic nonaneurysmal abdominal aorta. Patent portal, splenic, hepatic and renal veins. No pathologically enlarged lymph nodes in the abdomen or pelvis. Reproductive: Numerous brachytherapy seeds are seen within the normal size prostate. Other: No pneumoperitoneum, ascites or focal fluid collection. Musculoskeletal: No aggressive appearing focal osseous lesions. Moderate to severe T11 and T12 and mild L1 chronic vertebral compression fractures status post vertebroplasty. Moderate degenerative changes throughout the visualized thoracolumbar spine. IMPRESSION: 1. Indeterminate lobulated cystic pancreatic lesion in the pancreatic head measuring 1.6 x 1.3 x 2.8 cm, neoplasm not excluded. No biliary ductal dilatation. No main pancreatic duct dilation. Recommend further characterization with MRI abdomen with and without intravenous contrast. 2. Solitary subcentimeter left liver lobe hypodense lesion, too small to characterize. Suggestion of diffuse hepatic steatosis. 3. Dense material layering in the fundal gallbladder, suggesting tiny gallstones. Nonspecific diffuse gallbladder wall thickening. No pericholecystic fluid. Findings are nonspecific, and there was no evidence of acute cholecystitis on the abdominal sonogram  from earlier today. 4. Small right pleural effusion.  Moderate bibasilar atelectasis. 5. Three-vessel coronary atherosclerosis. 6. Mild diffuse bladder wall thickening, nonspecific, likely due to  chronic bladder voiding dysfunction. Correlate with urinalysis to exclude acute cystitis. Electronically Signed   By: Ilona Sorrel M.D.   On: 05/22/2015 21:31   Medications:  I have reviewed the patient's current medications Scheduled Meds: . feeding supplement (ENSURE ENLIVE)  237 mL Oral BID BM  . folic acid  1 mg Oral Daily  . Influenza vac split quadrivalent PF  0.5 mL Intramuscular Tomorrow-1000  . insulin aspart  0-5 Units Subcutaneous QHS  . insulin aspart  0-9 Units Subcutaneous TID WC  . mirtazapine  30 mg Oral QHS  . multivitamin with minerals  1 tablet Oral Daily  . sodium chloride  3 mL Intravenous Q12H  . thiamine  100 mg Intravenous Daily   Continuous Infusions:  PRN Meds:.sodium chloride, acetaminophen **OR** acetaminophen, alum & mag hydroxide-simeth, ondansetron **OR** ondansetron (ZOFRAN) IV, sodium chloride   Assessment/Plan: #1. Jaundice. Pancreatic lesion. Schedule MRI of the abdomen with and without contrast.  Hepatic profile pending. CBC ordered yesterday remains pending for unclear reasons. Obtain CA-19-9. #2. Pressure ulcer. Local treatment. #3. Diabetes. Continue sliding scale NovoLog. #4. Hyponatremia. Active Problems:   RUQ abdominal pain   Pressure ulcer     LOS: 1 day   Wisdom Seybold 05/23/2015, 7:34 AM

## 2015-05-23 NOTE — Evaluation (Signed)
Physical Therapy Evaluation Patient Details Name: Stephen Bates MRN: [id number] DOB: 05-13-1927 Today's Date: 05/23/2015   History of Present Illness  Pt became jaundiced at home and noticed dark colored urine and decided to come to [facility] after direction from Dr. Willey Blade. Pt admitted, also experiencing nausea and RUQ pain. Imaging reports reveal a long list of findings, most concerning some abnormaliy of the pancreatic head, which is being looked at further. PMH includes prostate CA and a dense history of balance probalems and falls, cumulating in a signficant fall in 2014 wherein pt sustains a head injury. Pt has not ambulated since and remains in bed most of the time at home, transfering occsionally to [facility] with assistance from aid 3x/week.   Clinical Impression  PT asked to assess pts current mobility status, baseline mobility, and determine any skilled need. History taken from pt and wife. Pt has been bed bound at home since 2014 after a long stretch of frequent falls and unsafe ambulation attempts. Pt reports he is currently at baseline, denies pain, or any other major changes to pain or strength. Pt currently only getting OOB 3x weekly with assistance from in home aid. Wife would like to be able to transfer pt OOB independently and safely and is interested in using a hoyer style lift to improve. Pt and wife educated on the increased risk in health problems associated with prolonged bed rest, and are agreeable that getting up to a chair daily would benefit the patient's health. This PT is agreeable that return to home is acceptable at this time, and that patient and family will benefit from having a lift in home to decrease caregiver burden and to improve livelihood, quality of life, and general wellbeing of the patient going forward. Granted recent DC from HHPT orders with limited progress, in addition to pt being at functional baseline, I see no need for continued skilled PT services at this time and  will sign off.      Follow Up Recommendations Home health PT (HHPT for caregiver training on use of lift for transfers. )    Equipment Recommendations  Other (comment) Harrel Lemon lift)    Recommendations for Other Services Other (comment)     Precautions / Restrictions Precautions Precautions: None Restrictions Weight Bearing Restrictions: Yes (Strict Bedrest Orders in. )      Mobility  Bed Mobility               General bed mobility comments: not assessed at this time. home equipment assessment only. Pt reportedly is at baseline, wife is agreeable.   Transfers                 General transfer comment: not assessed at this time. home equipment assessment only. Pt reportedly is at baseline, wife is agreeable.   Ambulation/Gait                Stairs            Wheelchair Mobility    Modified Rankin (Stroke Patients Only)       Balance Overall balance assessment: History of Falls                                           Pertinent Vitals/Pain Pain Assessment: No/denies pain    Home Living Family/patient expects to be discharged to:: Private residence Living Arrangements: Spouse/significant other Available Help at  Discharge: Family;Personal care attendant (Aid on MWF, and RN 1x/wk. Daughter visits 1x weekly from [employer] to [employer] with groceries. ) Type of Home: House Home Access: Stairs to enter;Ramped entrance     Home Layout: One level Home Equipment: Environmental consultant - 2 wheels;Hospital bed;Bedside commode;Wheelchair - manual (has BSC, but has never been able to use it due to difficulty transfering. )      Prior Function Level of Independence: Needs assistance   Gait / Transfers Assistance Needed: Max-totalA for all transfers, does not ambulate; recently DC from [facility] after limited progress toward goals. Pt/wife report marginally improved indep in rolling in bed. Wife is unable to transfer pt out of bed alone.   ADL's /  Homemaking Assistance Needed: Pt urinates with a hand held urinal; pt voids bowels in bed, and requires help to be cleaned up.         Hand Dominance   Dominant Hand: Right    Extremity/Trunk Assessment                         Communication   Communication: No difficulties  Cognition Arousal/Alertness: Awake/alert Behavior During Therapy: WFL for tasks assessed/performed Overall Cognitive Status: Within Functional Limits for tasks assessed (mild delayed response, reaction time. )                      General Comments General comments (skin integrity, edema, etc.): Very strong history of falls at home.     Exercises        Assessment/Plan    PT Assessment All further PT needs can be met in the next venue of care;Patent does not need any further PT services  PT Diagnosis Generalized weakness;Difficulty walking;Abnormality of gait   PT Problem List Decreased range of motion;Decreased activity tolerance;Decreased balance;Decreased mobility;Decreased coordination;Decreased cognition;Decreased strength  PT Treatment Interventions     PT Goals (Current goals can be found in the Care Plan section) Acute Rehab PT Goals Patient Stated Goal: return to home and be able to get OOB more regularly.  PT Goal Formulation: All assessment and education complete, DC therapy    Frequency     Barriers to discharge        Co-evaluation               End of Session     Patient left: in bed;with call bell/phone within reach;with family/visitor present Nurse Communication: Mobility status;Other (comment);Need for lift equipment         Time: 1300-1319 PT Time Calculation (min) (ACUTE ONLY): 19 min   Charges:   PT Evaluation $Initial PT Evaluation Tier I: 1 Procedure     PT G Codes:        Mayda Shippee C 2015/05/25, 1:32 PM  1:40 PM  Etta Grandchild, PT, DPT [place] License # [id number]

## 2015-05-23 NOTE — Consult Note (Signed)
Referring Provider: Asencion Noble, MD Primary Care Physician:  Asencion Noble, MD Primary Gastroenterologist:  Dr. Laural Golden  Reason for Consultation:    Jaundice and cystic pancreatic mass.  HPI:   History is obtained from the patient and his wife was at bedside.  Patient is 81 year old Caucasian male who was noted to be jaundiced 2 days ago.  Patient was taken to Dr. Ria Comment office yesterday and underwent upper abdominal ultrasound which revealed echogenic material within the gallbladder and bile duct measured 5 mm. Ultrasound was followed by abdominopelvic CT yesterday revealing lobulated cystic lesion in head of pancreas, cholelithiasis and mild gallbladder wall thickening. This study also revealed small right pleural effusion. Patient was therefore admitted earlier today and underwent MRCP which once again reveals lobulated macrocystic pancreatic head mass measuring 24 x 14 x 39 mm with enhancing internal septations or connection to nondilated main pancreatic duct. Furthermore several additional nonenhancing cystic pancreatic lesions are noted throughout the pancreas. MRCP confirmed cholelithiasis and mild gallbladder wall thickening though there was no evidence of pericholecystic fluid. Bile duct was felt not to be dilated. Patient has not felt well for the last 1 week. There has been drop in his appetite. He was noted to be yellow by sitter and his wife earlier this week. He has noted vague pain over the right rib cage laterally. Pain is worse on bending or moving. He also has noted change in color of urine over the weekend. He denies nausea vomiting fever or chills. There is no history of pruritus. He has irregular bowel movements. He has had diarrhea alternating with constipation. He denies melena or rectal bleeding. His wife states that he has lost about 20 pounds this year. Patient has history of bilateral extremity weakness and he has not been able to walk for over a year. He began to have falling  episodes about 2 years ago. Patient is essentially bed and wheelchair bound. He has been drinking alcohol couple of drinks a day for relaxation. He has not had any alcohol in one week. He does not feel nervous or jittery. There is no history of elevated transaminases in the past. Markers were hepatitis a, B and C are negative. CA-19-9 is pending. Patient lives at home with his wife. They have 2 daughters. He is retired. He used to smoke cigarettes but quit over 40 years ago. He has been drinking alcohol over the last year or so as above. Family history significant for colon carcinoma in a sister who died at age 26, bladder cancer in his father and his daughter has stable pancreatic lesion felt to be benign.   Past Medical History  Diagnosis Date  . Hypertension   . Prostate cancer ([place])   . Weakness of both legs   . Stroke ([place]) 1982  . Wears glasses   . Arthritis   . Benign neoplasm of colon   . History of right bundle branch block   . Diabetes ([place])   . Mild dementia   . Neuropathy ([place])   . Frequent falls 2012  . Chronic back pain   . Lumbar radiculopathy     Past Surgical History  Procedure Laterality Date  . Eye surgery  2010    both cataracts  . Colonoscopy    . Insertion prostate radiation seed  2001  . Mohs surgery  12/12/2008  . Kyphoplasty      Prior to Admission medications   Medication Sig Start Date End Date Taking? Authorizing Provider  acetaminophen (TYLENOL) 325 MG  tablet Take 650 mg by mouth every 4 (four) hours as needed for moderate pain.   Yes Historical Provider, MD  aspirin EC 81 MG tablet Take 81 mg by mouth daily.   Yes Historical Provider, MD  benazepril (LOTENSIN) 40 MG tablet Take 1 tablet by mouth every morning.  10/04/13  Yes Historical Provider, MD  docusate sodium (COLACE) 100 MG capsule Take 100 mg by mouth daily as needed for mild constipation.    Yes Historical Provider, MD  mirtazapine (REMERON) 30 MG tablet Take 30 mg by mouth at bedtime.    Yes Historical Provider, MD  Multiple Vitamins-Minerals (CENTRUM SILVER ADULT 50+ PO) Take 1 tablet by mouth every morning.   Yes Historical Provider, MD  polyethylene glycol (MIRALAX / GLYCOLAX) packet Take 17 g by mouth daily as needed for mild constipation.    Yes Historical Provider, MD  AMBULATORY NON FORMULARY MEDICATION 1 Device by Does not apply route daily. Lift Chair 05/11/14   Eustace Quail Tat, DO    Current Facility-Administered Medications  Medication Dose Route Frequency Provider Last Rate Last Dose  . 0.9 %  sodium chloride infusion  250 mL Intravenous PRN Asencion Noble, MD      . acetaminophen (TYLENOL) tablet 650 mg  650 mg Oral Q6H PRN Asencion Noble, MD       Or  . acetaminophen (TYLENOL) suppository 650 mg  650 mg Rectal Q6H PRN Asencion Noble, MD      . alum & mag hydroxide-simeth (MAALOX/MYLANTA) 200-200-20 MG/5ML suspension 30 mL  30 mL Oral Q6H PRN Asencion Noble, MD      . feeding supplement (ENSURE ENLIVE) (ENSURE ENLIVE) liquid 237 mL  237 mL Oral BID BM Asencion Noble, MD   237 mL at 05/23/15 1352  . folic acid (FOLVITE) tablet 1 mg  1 mg Oral Daily Asencion Noble, MD   1 mg at 05/23/15 1135  . insulin aspart (novoLOG) injection 0-5 Units  0-5 Units Subcutaneous QHS Asencion Noble, MD   2 Units at 05/22/15 2139  . insulin aspart (novoLOG) injection 0-9 Units  0-9 Units Subcutaneous TID WC Asencion Noble, MD   3 Units at 05/23/15 1740  . mirtazapine (REMERON) tablet 30 mg  30 mg Oral QHS Asencion Noble, MD   30 mg at 05/22/15 2138  . multivitamin with minerals tablet 1 tablet  1 tablet Oral Daily Asencion Noble, MD   1 tablet at 05/23/15 1135  . ondansetron (ZOFRAN) tablet 4 mg  4 mg Oral Q6H PRN Asencion Noble, MD       Or  . ondansetron [facility]) injection 4 mg  4 mg Intravenous Q6H PRN Asencion Noble, MD      . sodium chloride 0.9 % injection 3 mL  3 mL Intravenous Q12H Asencion Noble, MD   3 mL at 05/23/15 1152  . sodium chloride 0.9 % injection 3 mL  3 mL Intravenous PRN Asencion Noble, MD      . thiamine (B-1) injection 100 mg  100  mg Intravenous Daily Asencion Noble, MD   100 mg at 05/23/15 1137    Allergies as of 05/22/2015  . (No Known Allergies)    Family History  Problem Relation Age of Onset  . Colon cancer Sister   . Bladder Cancer Father   . Bronchopulmonary dysplasia Mother     Social History   Social History  . Marital Status: Married    Spouse Name: [name]  . Number of Children: 2  . Years of  Education: HS   Occupational History  . Retired     Teacher, early years/pre   Social History Main Topics  . Smoking status: Former Smoker    Quit date: 06/08/1969  . Smokeless tobacco: Never Used  . Alcohol Use: Yes     Comment: daily x2 now   . Drug Use: No  . Sexual Activity: Not on file   Other Topics Concern  . Not on file   Social History Narrative   Lives with wife in one story home.  2 daughters.  Education: 11th grade.  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          Review of Systems: See HPI, otherwise normal ROS  Physical Exam: Temp:  [98 F (36.7 C)-98.5 F (36.9 C)] 98 F (36.7 C) (11/16 1300) Pulse Rate:  [82-98] 83 (11/16 1417) Resp:  [18-20] 18 (11/16 1417) BP: (116-132)/(53-71) 119/55 mmHg (11/16 1417) SpO2:  [94 %-97 %] 96 % (11/16 1417) Weight:  [126 lb 12.2 oz (57.5 kg)] 126 lb 12.2 oz (57.5 kg) (11/16 0603) Last BM Date: 05/22/15 Patient is alert and in no acute distress. He has few scabs over his scalp. Conjunctiva was pink. Sclera is icteric. Oropharyngeal mucosa is unremarkable. No neck masses or thyromegaly noted. Cardiac exam with regular rhythm normal S1 and S2. No murmur or gallop noted. Abdomen is full. Bowel sounds are normal. Small scar noted in left side of abdomen with erythema to skin. On palpation abdomen is soft without tenderness or masses. Gallbladder is palpable below the costal margin just inside anterior axillary line. Liver edge is indistinct. No peripheral edema or clubbing noted. He has muscle wasting in both lower extremities.    Lab  Results:  Recent Labs  05/23/15 0518  WBC 11.2*  HGB 12.5*  HCT 35.1*  PLT 339   BMET  Recent Labs  05/22/15 1420  NA 130*  K 3.8  CL 96*  CO2 27  GLUCOSE 233*  BUN 14  CREATININE 0.31*  CALCIUM 9.0   LFT  Recent Labs  05/23/15 0518  PROT 6.6  ALBUMIN 2.5*  AST 364*  ALT 289*  ALKPHOS 521*  BILITOT 8.1*  BILIDIR 5.4*  IBILI 2.7*   PT/INR  Recent Labs  05/22/15 1420  LABPROT 13.7  INR 1.03   Hepatitis Panel  Recent Labs  05/22/15 1826  HEPBSAG Negative  HCVAB <0.1  HEPAIGM Negative    Studies/Results: Dg Chest 1 View  05/23/2015  CLINICAL DATA:  Follow-up pleural effusion EXAM: CHEST 1 VIEW COMPARISON:  Portable chest x-ray of October 31, 2014 FINDINGS: The lungs are hypoinflated. There is no focal infiltrate. Stable subtle density projecting over the lateral aspect of the left hemi thorax in the region of previous findings is noted. There may be a trace of pleural fluid bilaterally. Minimal subsegmental atelectasis at both lung bases is present. There is no alveolar infiltrate. The heart and pulmonary vascularity are normal. The mediastinum is normal in width. The bony thorax exhibits no acute abnormality. There are degenerative changes of the left shoulder. IMPRESSION: There is no significant pleural effusion but the study is limited due to hypoinflation. Stable scarring peripherally in the left mid lung. No acute cardiopulmonary abnormality is observed. Electronically Signed   By: David  Martinique M.D.   On: 05/23/2015 08:19   Mr Abdomen W Wo Contrast  05/23/2015  CLINICAL DATA:  Indeterminate cystic pancreatic head lesion on CT study from 1 day prior. Elevated serum bilirubin an transaminitis.  Hypoalbuminemia. History of prostate cancer. EXAM: MRI ABDOMEN WITHOUT AND WITH CONTRAST TECHNIQUE: Multiplanar multisequence MR imaging of the abdomen was performed both before and after the administration of intravenous contrast. CONTRAST:  76mL MULTIHANCE  GADOBENATE DIMEGLUMINE 529 MG/ML IV SOLN COMPARISON:  05/22/2015 CT abdomen/ pelvis. FINDINGS: Many sequences are motion degraded. Lower chest: Small right pleural effusion with associated passive dependent right lower lobe atelectasis. Trace left pleural effusion with subsegmental left lower lobe atelectasis. Hepatobiliary: Normal liver size and configuration. No hepatic steatosis. Simple 0.5 cm left liver lobe cyst. No additional liver lesions. There are multiple tiny layering gallstones in the gallbladder fundus, largest 0.6 cm. There is also moderate layering sludge in the gallbladder. There is mild diffuse gallbladder wall thickening, with no pericholecystic fat stranding or fluid and no significant gallbladder distention. No intrahepatic biliary ductal dilatation. Normal caliber common bile duct, which measures 3 mm diameter. No evidence of choledocholithiasis. No evidence of a biliary stricture. Pancreas: No pancreas divisum. No main pancreatic duct dilation. There is a lobulated macrocystic 2.4 x 1.4 x 3.9 cm pancreatic mass in the pancreatic head (series 26/image 27 and series 3/image 22), which demonstrates a few thin internal enhancing septations and no enhancing solid nodular components, and appears to demonstrate a connection with the ventral pancreatic duct on the MRCP sequence. There are greater than 10 additional scattered subcentimeter unilocular nonenhancing cystic pancreatic lesions scattered throughout the body and tail of the pancreas, largest 0.8 cm in the pancreatic body (series 6/image 23). No peripancreatic fat stranding or fluid collection . Spleen: Normal size. No mass. Adrenals/Urinary Tract: Normal adrenals. No hydronephrosis. 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 There is a minimally complex 0.6 cm upper left renal cyst with thin internal septation (Bosniak category 2). There are additional subcentimeter simple renal cysts in both kidneys.  Stomach/Bowel: Grossly normal stomach. Visualized small and large bowel is normal caliber, with no bowel wall thickening. Vascular/Lymphatic: Atherosclerotic nonaneurysmal abdominal aorta. Patent portal, splenic, hepatic and renal veins. No pathologically enlarged lymph nodes in the abdomen. Other: No abdominal ascites or focal fluid collection. Musculoskeletal: No aggressive appearing focal osseous lesions. There are chronic T11, T12 and L1 vertebral body compression fracture status post vertebroplasty. There is a hemangioma in the T9 vertebral body. IMPRESSION: 1. Lobulated macrocystic pancreatic head mass measuring 2.4 x 1.4 x 3.9 cm, with thin enhancing internal septations and no enhancing solid nodular components, which demonstrates a connection to the nondilated main pancreatic duct. A side branch IPMN is favored. A follow-up MRI abdomen with and without intravenous contrast is recommended in 6 months. 2. Several (greater than 10) additional nonenhancing nonaggressive cystic pancreatic lesions scattered throughout the pancreas, likely additional small side branch IPMNs. 3. Cholelithiasis and gallbladder sludge. Mild diffuse gallbladder wall thickening, favored to be due to noninflammatory edema (such as due to the patient's hypoalbuminemia) given the absence of pericholecystic fluid in the lack of significant gallbladder distention. 4. No biliary ductal dilatation. No evidence of choledocholithiasis. 5. Small right and trace left pleural effusions with bibasilar atelectasis. Electronically Signed   By: Ilona Sorrel M.D.   On: 05/23/2015 09:39   US Abdomen Complete  05/22/2015  CLINICAL DATA:  Jaundice EXAM: ULTRASOUND ABDOMEN COMPLETE COMPARISON:  None. FINDINGS: Gallbladder: Well distended without evidence of definitive cholelithiasis. Echogenic material is noted within consistent with gallbladder sludge. A negative sonographic Percell Miller sign is noted. Common bile duct: Diameter: 5 mm. Liver: No focal lesion  identified. Within normal limits  in parenchymal echogenicity. IVC: Not well visualized due to overlying bowel gas. Pancreas: Not well visualized due to overlying bowel gas. Spleen: Size and appearance within normal limits. Right Kidney: Length: 11.5 cm. 9 mm calcific density is noted which may represent a nonobstructing renal stone. Left Kidney: Length: 11.0 cm. Echogenicity within normal limits. No mass or hydronephrosis visualized. Abdominal aorta: No aneurysm visualized. Other findings: None. IMPRESSION: Calcification within the right kidney likely representing a nonobstructing renal stone. Gallbladder sludge. No other focal abnormality is seen. Electronically Signed   By: Inez Catalina M.D.   On: 05/22/2015 16:18   Ct Abdomen Pelvis W Contrast  05/22/2015  CLINICAL DATA:  Jaundice.  Prostate cancer. EXAM: CT ABDOMEN AND PELVIS WITH CONTRAST TECHNIQUE: Multidetector CT imaging of the abdomen and pelvis was performed using the standard protocol following bolus administration of intravenous contrast. CONTRAST:  119mL OMNIPAQUE IOHEXOL 300 MG/ML  SOLN COMPARISON:  Abdominal sonogram from earlier today. FINDINGS: Lower chest: Small right pleural effusion with moderate passive right lower lobe atelectasis. Segmental basilar left lower lobe atelectasis. Left anterior descending, left circumflex and right coronary atherosclerosis. Hepatobiliary: Suggestion of diffuse hepatic steatosis. Hypodense 0.5 cm segment 2 left liver lobe lesion, too small to characterize. No additional liver lesions. There is slightly dense layering material in the fundal gallbladder, suggesting tiny gallstones. There is mild diffuse gallbladder wall thickening without pericholecystic fluid. There is no intrahepatic or extrahepatic biliary ductal dilatation. The common bile duct measures 3 mm diameter. Pancreas: There is a 1.6 x 1.3 x 2.8 cm lobulated cystic pancreatic lesion in the pancreatic head (series 3/ image 38 and series 5/image 45).  No main pancreatic duct dilation. No additional pancreatic lesions. No peripancreatic fat stranding or fluid. Spleen: Normal size. No mass. Adrenals/Urinary Tract: Normal adrenals. There are a few hypodense subcentimeter renal lesions in both kidneys, too small to characterize. No hydronephrosis. Mild diffuse bladder wall thickening. No appreciable focal bladder abnormality. Stomach/Bowel: Grossly normal stomach. Normal caliber small bowel with no small bowel wall thickening. Normal appendix . Normal large bowel with no diverticulosis, large bowel wall thickening or pericolonic fat stranding. Vascular/Lymphatic: Atherosclerotic nonaneurysmal abdominal aorta. Patent portal, splenic, hepatic and renal veins. No pathologically enlarged lymph nodes in the abdomen or pelvis. Reproductive: Numerous brachytherapy seeds are seen within the normal size prostate. Other: No pneumoperitoneum, ascites or focal fluid collection. Musculoskeletal: No aggressive appearing focal osseous lesions. Moderate to severe T11 and T12 and mild L1 chronic vertebral compression fractures status post vertebroplasty. Moderate degenerative changes throughout the visualized thoracolumbar spine. IMPRESSION: 1. Indeterminate lobulated cystic pancreatic lesion in the pancreatic head measuring 1.6 x 1.3 x 2.8 cm, neoplasm not excluded. No biliary ductal dilatation. No main pancreatic duct dilation. Recommend further characterization with MRI abdomen with and without intravenous contrast. 2. Solitary subcentimeter left liver lobe hypodense lesion, too small to characterize. Suggestion of diffuse hepatic steatosis. 3. Dense material layering in the fundal gallbladder, suggesting tiny gallstones. Nonspecific diffuse gallbladder wall thickening. No pericholecystic fluid. Findings are nonspecific, and there was no evidence of acute cholecystitis on the abdominal sonogram from earlier today. 4. Small right pleural effusion.  Moderate bibasilar atelectasis.  5. Three-vessel coronary atherosclerosis. 6. Mild diffuse bladder wall thickening, nonspecific, likely due to chronic bladder voiding dysfunction. Correlate with urinalysis to exclude acute cystitis. Electronically Signed   By: Ilona Sorrel M.D.   On: 05/22/2015 21:31   Mr 3d Recon At Scanner  05/23/2015  CLINICAL DATA:  Indeterminate cystic pancreatic head lesion on CT  study from 1 day prior. Elevated serum bilirubin an transaminitis. Hypoalbuminemia. History of prostate cancer. EXAM: MRI ABDOMEN WITHOUT AND WITH CONTRAST TECHNIQUE: Multiplanar multisequence MR imaging of the abdomen was performed both before and after the administration of intravenous contrast. CONTRAST:  8mL MULTIHANCE GADOBENATE DIMEGLUMINE 529 MG/ML IV SOLN COMPARISON:  05/22/2015 CT abdomen/ pelvis. FINDINGS: Many sequences are motion degraded. Lower chest: Small right pleural effusion with associated passive dependent right lower lobe atelectasis. Trace left pleural effusion with subsegmental left lower lobe atelectasis. Hepatobiliary: Normal liver size and configuration. No hepatic steatosis. Simple 0.5 cm left liver lobe cyst. No additional liver lesions. There are multiple tiny layering gallstones in the gallbladder fundus, largest 0.6 cm. There is also moderate layering sludge in the gallbladder. There is mild diffuse gallbladder wall thickening, with no pericholecystic fat stranding or fluid and no significant gallbladder distention. No intrahepatic biliary ductal dilatation. Normal caliber common bile duct, which measures 3 mm diameter. No evidence of choledocholithiasis. No evidence of a biliary stricture. Pancreas: No pancreas divisum. No main pancreatic duct dilation. There is a lobulated macrocystic 2.4 x 1.4 x 3.9 cm pancreatic mass in the pancreatic head (series 26/image 27 and series 3/image 22), which demonstrates a few thin internal enhancing septations and no enhancing solid nodular components, and appears to demonstrate  a connection with the ventral pancreatic duct on the MRCP sequence. There are greater than 10 additional scattered subcentimeter unilocular nonenhancing cystic pancreatic lesions scattered throughout the body and tail of the pancreas, largest 0.8 cm in the pancreatic body (series 6/image 23). No peripancreatic fat stranding or fluid collection . Spleen: Normal size. No mass. Adrenals/Urinary Tract: Normal adrenals. No hydronephrosis. 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 There is a minimally complex 0.6 cm upper left renal cyst with thin internal septation (Bosniak category 2). There are additional subcentimeter simple renal cysts in both kidneys. Stomach/Bowel: Grossly normal stomach. Visualized small and large bowel is normal caliber, with no bowel wall thickening. Vascular/Lymphatic: Atherosclerotic nonaneurysmal abdominal aorta. Patent portal, splenic, hepatic and renal veins. No pathologically enlarged lymph nodes in the abdomen. Other: No abdominal ascites or focal fluid collection. Musculoskeletal: No aggressive appearing focal osseous lesions. There are chronic T11, T12 and L1 vertebral body compression fracture status post vertebroplasty. There is a hemangioma in the T9 vertebral body. IMPRESSION: 1. Lobulated macrocystic pancreatic head mass measuring 2.4 x 1.4 x 3.9 cm, with thin enhancing internal septations and no enhancing solid nodular components, which demonstrates a connection to the nondilated main pancreatic duct. A side branch IPMN is favored. A follow-up MRI abdomen with and without intravenous contrast is recommended in 6 months. 2. Several (greater than 10) additional nonenhancing nonaggressive cystic pancreatic lesions scattered throughout the pancreas, likely additional small side branch IPMNs. 3. Cholelithiasis and gallbladder sludge. Mild diffuse gallbladder wall thickening, favored to be due to noninflammatory edema (such as due to the patient's  hypoalbuminemia) given the absence of pericholecystic fluid in the lack of significant gallbladder distention. 4. No biliary ductal dilatation. No evidence of choledocholithiasis. 5. Small right and trace left pleural effusions with bibasilar atelectasis. Electronically Signed   By: Ilona Sorrel M.D.   On: 05/23/2015 09:39   I have reviewed ultrasound, CT and MRCP on with Dr. Thornton Papas. Biliary system is not dilated but there is small gap in distal bile duct next to part of cystic mass which is extending to the right site.  Assessment; Patient is 81 year old Caucasian male with with  multiple medical problems who presents with painless jaundice and noted to have cholelithiasis nondilated biliary tract dominant cystic pancreatic lesion in head of pancreas along with smaller lesions scattered throughout the pancreas. IPMN raised as diagnostic possibility. I suspect patient has obstructive jaundice secondary to cystic pancreatic neoplasm. However CBD is within normal limits. Bile duct that had been smaller to begin with. Patient drinks alcohol regularly but I do not believe he has alcoholic hepatitis. His INR is normal knee does not have any withdrawal symptoms. ALT is much higher than one would expect to see with alcohol induced liver injury. Bilirubin and alkaline phosphatase is trending upwards. He therefore would benefit from diagnostic and therapeutic ERCP. Patient is deemed to be high risk for intervention.  Recommendations; Will repeat INR and LFTs in a.m. ERCP with biliary stenting in a.m. Further recommendations to follow. I have reviewed the procedure and risks in detail with patient and his wife and brother were at bedside. I also talked with his daughter Debroah Baller over the phone and answered all questions.    LOS: 1 day   Qasim Diveley U  05/23/2015, 5:48 PM

## 2015-05-23 NOTE — Progress Notes (Signed)
Dr. Willey Blade called, ordered GI Consult with [name] for jaundice after seeing MRI results, wants urine specimen collected per order.  Consult entered, placed in consult book.

## 2015-05-23 NOTE — H&P (Signed)
PCP:   Asencion Noble, MD   Chief Complaint:  Yellow skin  HPI: This patient is an 81 year old bedridden white male who presented to the office by ambulance after his family had noted his skin turning yellow over the past week and his urine turning dark. He had experienced some right upper quadrant pain is well no vomiting or fever. He has been eating as usual. Bowels have been moving as usual. He typically has one cocktail per day but is previously had no liver disease. He has had a history of prostate cancer but no metastatic disease. He has been unable to walk due to chronic leg weakness and poor balance.    Past Medical History  Diagnosis Date  . Hypertension   . Prostate cancer ([place])   . Weakness of both legs   . Stroke ([place]) 1982  . Wears glasses   . Arthritis   . Benign neoplasm of colon   . History of right bundle branch block   . Diabetes ([place])   . Mild dementia   . Neuropathy ([place])   . Frequent falls 2012  . Chronic back pain   . Lumbar radiculopathy      Past Surgical History  Procedure Laterality Date  . Eye surgery  2010    both cataracts  . Colonoscopy    . Insertion prostate radiation seed  2001  . Mohs surgery  12/12/2008  . Kyphoplasty       Prior to Admission medications   Medication Sig Start Date End Date Taking? Authorizing Provider  acetaminophen (TYLENOL) 325 MG tablet Take 650 mg by mouth every 4 (four) hours as needed for moderate pain.   Yes Historical Provider, MD  aspirin EC 81 MG tablet Take 81 mg by mouth daily.   Yes Historical Provider, MD  benazepril (LOTENSIN) 40 MG tablet Take 1 tablet by mouth every morning.  10/04/13  Yes Historical Provider, MD  docusate sodium (COLACE) 100 MG capsule Take 100 mg by mouth daily as needed for mild constipation.    Yes Historical Provider, MD  mirtazapine (REMERON) 30 MG tablet Take 30 mg by mouth at bedtime.   Yes Historical Provider, MD  Multiple Vitamins-Minerals (CENTRUM SILVER  ADULT 50+ PO) Take 1 tablet by mouth every morning.   Yes Historical Provider, MD  polyethylene glycol (MIRALAX / GLYCOLAX) packet Take 17 g by mouth daily as needed for mild constipation.    Yes Historical Provider, MD  AMBULATORY NON FORMULARY MEDICATION 1 Device by Does not apply route daily. Lift Chair 05/11/14   Eustace Quail Tat, DO      Allergies:  No Known Allergies   Social History:  reports that he quit smoking about 45 years ago. He has never used smokeless tobacco. He reports that he drinks alcohol. He reports that he does not use illicit drugs.  Family History  Problem Relation Age of Onset  . Colon cancer Sister   . Bladder Cancer Father   . Bronchopulmonary dysplasia Mother     Review of Systems: No known weight loss. No fever, chills, difficulty breathing, chest pain, dysuria, hematuria, back pain or syncope. No recent falls. @NORROS @  Physical Exam: Blood pressure 132/53, pulse 82, temperature 98.4 F (36.9 C), temperature source Oral, resp. rate 20, height 5' 4"  (1.626 m), weight 126 lb 12.2 oz (57.5 kg), SpO2 97 %. Jaundiced appearing. Alert and oriented. HEENT: Scleral  icterus noted. Pharynx moist. Lungs clear. Heart regular with no murmurs. Abdomen somewhat protuberant but no definite ascites palpable. No tenderness on exam. No hepatosplenomegaly or mass noted. Extremities reveal normal pulses with no edema. Neuro exam reveals bilateral leg weakness. Lymph nodes reveal no cervical or supraclavicular adenopathy. Skin reveals multiple keratoses/possible skin cancers on his scalp.  Labs on Admission:  Results for orders placed or performed during the hospital encounter of 05/22/15 (from the past 48 hour(s))  Comprehensive metabolic panel     Status: Abnormal   Collection Time: 05/22/15  2:20 PM  Result Value Ref Range   Sodium 130 (L) 135 - 145 mmol/L   Potassium 3.8 3.5 - 5.1 mmol/L   Chloride 96 (L) 101 - 111 mmol/L   CO2 27 22 - 32 mmol/L   Glucose, Bld 233 (H)  65 - 99 mg/dL   BUN 14 6 - 20 mg/dL   Creatinine, Ser 0.31 (L) 0.61 - 1.24 mg/dL   Calcium 9.0 8.9 - 10.3 mg/dL   Total Protein 6.6 6.5 - 8.1 g/dL   Albumin 2.5 (L) 3.5 - 5.0 g/dL   AST 212 (H) 15 - 41 U/L   ALT 221 (H) 17 - 63 U/L   Alkaline Phosphatase 459 (H) 38 - 126 U/L   Total Bilirubin 7.9 (H) 0.3 - 1.2 mg/dL   GFR calc non Af Amer >60 >60 mL/min   GFR calc Af Amer >60 >60 mL/min    Comment: (NOTE) The eGFR has been calculated using the CKD EPI equation. This calculation has not been validated in all clinical situations. eGFR's persistently <60 mL/min signify possible Chronic Kidney Disease.    Anion gap 7 5 - 15  Protime-INR     Status: None   Collection Time: 05/22/15  2:20 PM  Result Value Ref Range   Prothrombin Time 13.7 11.6 - 15.2 seconds   INR 1.03 0.00 - 1.49  Hepatitis B surface antigen     Status: None   Collection Time: 05/22/15  6:26 PM  Result Value Ref Range   Hepatitis B Surface Ag Negative Negative    Comment: (NOTE) Performed At: [facility] [address] [place], [place] [id number] Lindon Romp MD UY:[id number]   Glucose, capillary     Status: Abnormal   Collection Time: 05/22/15  9:23 PM  Result Value Ref Range   Glucose-Capillary 212 (H) 65 - 99 mg/dL   Comment 1 Notify RN    Comment 2 Document in Chart     Radiological Exams on Admission: US Abdomen Complete  05/22/2015  CLINICAL DATA:  Jaundice EXAM: ULTRASOUND ABDOMEN COMPLETE COMPARISON:  None. FINDINGS: Gallbladder: Well distended without evidence of definitive cholelithiasis. Echogenic material is noted within consistent with gallbladder sludge. A negative sonographic Percell Miller sign is noted. Common bile duct: Diameter: 5 mm. Liver: No focal lesion identified. Within normal limits in parenchymal echogenicity. IVC: Not well visualized due to overlying bowel gas. Pancreas: Not well visualized due to overlying bowel gas. Spleen: Size and appearance within normal limits. Right  Kidney: Length: 11.5 cm. 9 mm calcific density is noted which may represent a nonobstructing renal stone. Left Kidney: Length: 11.0 cm. Echogenicity within normal limits. No mass or hydronephrosis visualized. Abdominal aorta: No aneurysm visualized. Other findings: None. IMPRESSION: Calcification within the right kidney likely representing a nonobstructing renal stone. Gallbladder sludge. No other focal abnormality is seen. Electronically Signed   By: Inez Catalina M.D.   On: 05/22/2015 16:18   Ct Abdomen Pelvis  W Contrast  05/22/2015  CLINICAL DATA:  Jaundice.  Prostate cancer. EXAM: CT ABDOMEN AND PELVIS WITH CONTRAST TECHNIQUE: Multidetector CT imaging of the abdomen and pelvis was performed using the standard protocol following bolus administration of intravenous contrast. CONTRAST:  136m OMNIPAQUE IOHEXOL 300 MG/ML  SOLN COMPARISON:  Abdominal sonogram from earlier today. FINDINGS: Lower chest: Small right pleural effusion with moderate passive right lower lobe atelectasis. Segmental basilar left lower lobe atelectasis. Left anterior descending, left circumflex and right coronary atherosclerosis. Hepatobiliary: Suggestion of diffuse hepatic steatosis. Hypodense 0.5 cm segment 2 left liver lobe lesion, too small to characterize. No additional liver lesions. There is slightly dense layering material in the fundal gallbladder, suggesting tiny gallstones. There is mild diffuse gallbladder wall thickening without pericholecystic fluid. There is no intrahepatic or extrahepatic biliary ductal dilatation. The common bile duct measures 3 mm diameter. Pancreas: There is a 1.6 x 1.3 x 2.8 cm lobulated cystic pancreatic lesion in the pancreatic head (series 3/ image 38 and series 5/image 45). No main pancreatic duct dilation. No additional pancreatic lesions. No peripancreatic fat stranding or fluid. Spleen: Normal size. No mass. Adrenals/Urinary Tract: Normal adrenals. There are a few hypodense subcentimeter renal  lesions in both kidneys, too small to characterize. No hydronephrosis. Mild diffuse bladder wall thickening. No appreciable focal bladder abnormality. Stomach/Bowel: Grossly normal stomach. Normal caliber small bowel with no small bowel wall thickening. Normal appendix . Normal large bowel with no diverticulosis, large bowel wall thickening or pericolonic fat stranding. Vascular/Lymphatic: Atherosclerotic nonaneurysmal abdominal aorta. Patent portal, splenic, hepatic and renal veins. No pathologically enlarged lymph nodes in the abdomen or pelvis. Reproductive: Numerous brachytherapy seeds are seen within the normal size prostate. Other: No pneumoperitoneum, ascites or focal fluid collection. Musculoskeletal: No aggressive appearing focal osseous lesions. Moderate to severe T11 and T12 and mild L1 chronic vertebral compression fractures status post vertebroplasty. Moderate degenerative changes throughout the visualized thoracolumbar spine. IMPRESSION: 1. Indeterminate lobulated cystic pancreatic lesion in the pancreatic head measuring 1.6 x 1.3 x 2.8 cm, neoplasm not excluded. No biliary ductal dilatation. No main pancreatic duct dilation. Recommend further characterization with MRI abdomen with and without intravenous contrast. 2. Solitary subcentimeter left liver lobe hypodense lesion, too small to characterize. Suggestion of diffuse hepatic steatosis. 3. Dense material layering in the fundal gallbladder, suggesting tiny gallstones. Nonspecific diffuse gallbladder wall thickening. No pericholecystic fluid. Findings are nonspecific, and there was no evidence of acute cholecystitis on the abdominal sonogram from earlier today. 4. Small right pleural effusion.  Moderate bibasilar atelectasis. 5. Three-vessel coronary atherosclerosis. 6. Mild diffuse bladder wall thickening, nonspecific, likely due to chronic bladder voiding dysfunction. Correlate with urinalysis to exclude acute cystitis. Electronically Signed   By:  JIlona SorrelM.D.   On: 05/22/2015 21:31    Assessment/Plan #1. Jaundice. Bilirubin and transaminases are significantly elevated along with his alkaline phosphatase. Ultrasound reveals no sign of bile duct dilation. CT scan of the abdomen reveals a pancreatic head lesion and a possible liver lesion. MRI will be obtained. Hepatitis B surface antigen is negative. #2. Sacral pressure ulcer. #3. Diabetes. Treat with sliding scale NovoLog. #4. History of prostate carcinoma. He has had no signs of recurrence. #5. History of multiple skin cancers. #6. History of stroke. #7. Chronic leg weakness/imbalance. #8. Right pleural effusion. Check chest x-ray. #9. Bladder wall thickening. Urinalysis pending. Active Problems:   RUQ abdominal pain   Pressure ulcer    Stephen Bates    05/23/2015

## 2015-05-24 ENCOUNTER — Inpatient Hospital Stay (HOSPITAL_COMMUNITY): Payer: Medicare Other

## 2015-05-24 ENCOUNTER — Inpatient Hospital Stay (HOSPITAL_COMMUNITY): Payer: Medicare Other | Admitting: Anesthesiology

## 2015-05-24 ENCOUNTER — Encounter (HOSPITAL_COMMUNITY)
Admission: AD | Disposition: A | Discharge status: Home / Self Care | Payer: Self-pay | Source: Ambulatory Visit | Attending: Internal Medicine

## 2015-05-24 DIAGNOSIS — E440 Moderate protein-calorie malnutrition: Secondary | ICD-10-CM | POA: Insufficient documentation

## 2015-05-24 DIAGNOSIS — K8080 Other cholelithiasis without obstruction: Secondary | ICD-10-CM

## 2015-05-24 HISTORY — PX: SPHINCTEROTOMY: SHX5544

## 2015-05-24 HISTORY — PX: ERCP: SHX5425

## 2015-05-24 LAB — COMPREHENSIVE METABOLIC PANEL
ALK PHOS: 501 U/L — AB (ref 38–126)
ALT: 256 U/L — AB (ref 17–63)
AST: 248 U/L — AB (ref 15–41)
Albumin: 2.4 g/dL — ABNORMAL LOW (ref 3.5–5.0)
Anion gap: 5 (ref 5–15)
BILIRUBIN TOTAL: 7.5 mg/dL — AB (ref 0.3–1.2)
BUN: 13 mg/dL (ref 6–20)
CHLORIDE: 98 mmol/L — AB (ref 101–111)
CO2: 28 mmol/L (ref 22–32)
CREATININE: 0.31 mg/dL — AB (ref 0.61–1.24)
Calcium: 8.9 mg/dL (ref 8.9–10.3)
GFR calc Af Amer: >60 mL/min (ref >60)
Glucose, Bld: 170 mg/dL — ABNORMAL HIGH (ref 65–99)
Potassium: 3.3 mmol/L — ABNORMAL LOW (ref 3.5–5.1)
Sodium: 131 mmol/L — ABNORMAL LOW (ref 135–145)
Total Protein: 6.1 g/dL — ABNORMAL LOW (ref 6.5–8.1)

## 2015-05-24 LAB — URINALYSIS, ROUTINE W REFLEX MICROSCOPIC
GLUCOSE, UA: 100 mg/dL — AB
HGB URINE DIPSTICK: NEGATIVE
Ketones, ur: 15 mg/dL — AB
Nitrite: NEGATIVE
PH: 7 (ref 5.0–8.0)
Protein, ur: NEGATIVE mg/dL
SPECIFIC GRAVITY, URINE: 1.01 (ref 1.005–1.030)

## 2015-05-24 LAB — CANCER ANTIGEN 19-9: CA 19 9: 101 U/mL — AB (ref 0–35)

## 2015-05-24 LAB — URINE MICROSCOPIC-ADD ON

## 2015-05-24 LAB — CBC
HCT: 32.4 % — ABNORMAL LOW (ref 39.0–52.0)
Hemoglobin: 11.4 g/dL — ABNORMAL LOW (ref 13.0–17.0)
MCH: 35.4 pg — ABNORMAL HIGH (ref 26.0–34.0)
MCHC: 35.2 g/dL (ref 30.0–36.0)
MCV: 100.6 fL — ABNORMAL HIGH (ref 78.0–100.0)
Platelets: 345 10*3/uL (ref 150–400)
RBC: 3.22 MIL/uL — AB (ref 4.22–5.81)
RDW: 13.2 % (ref 11.5–15.5)
WBC: 11.8 10*3/uL — AB (ref 4.0–10.5)

## 2015-05-24 LAB — GLUCOSE, CAPILLARY
GLUCOSE-CAPILLARY: 178 mg/dL — AB (ref 65–99)
GLUCOSE-CAPILLARY: 212 mg/dL — AB (ref 65–99)
GLUCOSE-CAPILLARY: 212 mg/dL — AB (ref 65–99)
Glucose-Capillary: 170 mg/dL — ABNORMAL HIGH (ref 65–99)
Glucose-Capillary: 258 mg/dL — ABNORMAL HIGH (ref 65–99)

## 2015-05-24 SURGERY — ERCP, WITH INTERVENTION IF INDICATED
Anesthesia: General

## 2015-05-24 MED ORDER — ROCURONIUM BROMIDE 50 MG/5ML IV SOLN
INTRAVENOUS | Status: AC
  Filled 2015-05-24: qty 1

## 2015-05-24 MED ORDER — ETOMIDATE 2 MG/ML IV SOLN
INTRAVENOUS | Status: DC | PRN
  Administered 2015-05-24: 8 mg via INTRAVENOUS

## 2015-05-24 MED ORDER — MIDAZOLAM HCL 2 MG/2ML IJ SOLN
INTRAMUSCULAR | Status: AC
  Filled 2015-05-24: qty 2

## 2015-05-24 MED ORDER — ONDANSETRON HCL 4 MG/2ML IJ SOLN
INTRAMUSCULAR | Status: AC
  Filled 2015-05-24: qty 2

## 2015-05-24 MED ORDER — ONDANSETRON HCL 4 MG/2ML IJ SOLN
4.0000 mg | Freq: Once | INTRAMUSCULAR | Status: AC
  Administered 2015-05-24: 4 mg via INTRAVENOUS

## 2015-05-24 MED ORDER — DEXTROSE 5 % IV SOLN
INTRAVENOUS | Status: AC
  Filled 2015-05-24: qty 10

## 2015-05-24 MED ORDER — SODIUM CHLORIDE 0.9 % IV SOLN
INTRAVENOUS | Status: AC
  Filled 2015-05-24: qty 50

## 2015-05-24 MED ORDER — FENTANYL CITRATE (PF) 100 MCG/2ML IJ SOLN
INTRAMUSCULAR | Status: DC | PRN
  Administered 2015-05-24 (×2): 25 ug via INTRAVENOUS

## 2015-05-24 MED ORDER — ROCURONIUM BROMIDE 100 MG/10ML IV SOLN
INTRAVENOUS | Status: DC | PRN
  Administered 2015-05-24: 10 mg via INTRAVENOUS
  Administered 2015-05-24: 5 mg via INTRAVENOUS

## 2015-05-24 MED ORDER — LIDOCAINE HCL (CARDIAC) 10 MG/ML IV SOLN
INTRAVENOUS | Status: DC | PRN
  Administered 2015-05-24: 25 mg via INTRAVENOUS

## 2015-05-24 MED ORDER — SODIUM CHLORIDE 0.9 % IV SOLN
INTRAVENOUS | Status: DC | PRN
  Administered 2015-05-24: 100 mL

## 2015-05-24 MED ORDER — LACTATED RINGERS IV SOLN: INTRAVENOUS | Status: DC

## 2015-05-24 MED ORDER — SUCCINYLCHOLINE CHLORIDE 20 MG/ML IJ SOLN
INTRAMUSCULAR | Status: AC
  Filled 2015-05-24: qty 1

## 2015-05-24 MED ORDER — GLYCOPYRROLATE 0.2 MG/ML IJ SOLN
0.2000 mg | Freq: Once | INTRAMUSCULAR | Status: AC
  Administered 2015-05-24: 0.2 mg via INTRAVENOUS

## 2015-05-24 MED ORDER — STERILE WATER FOR IRRIGATION IR SOLN
Status: DC | PRN
  Administered 2015-05-24: 1000 mL

## 2015-05-24 MED ORDER — SUCCINYLCHOLINE CHLORIDE 20 MG/ML IJ SOLN
INTRAMUSCULAR | Status: DC | PRN
  Administered 2015-05-24: 140 mg via INTRAVENOUS

## 2015-05-24 MED ORDER — FENTANYL CITRATE (PF) 100 MCG/2ML IJ SOLN
INTRAMUSCULAR | Status: AC
  Filled 2015-05-24: qty 2

## 2015-05-24 MED ORDER — GLUCAGON HCL RDNA (DIAGNOSTIC) 1 MG IJ SOLR
INTRAMUSCULAR | Status: DC | PRN
  Administered 2015-05-24: 0.25 mg via INTRAVENOUS

## 2015-05-24 MED ORDER — ONDANSETRON HCL 4 MG/2ML IJ SOLN: 4.0000 mg | Freq: Once | INTRAMUSCULAR | Status: DC | PRN

## 2015-05-24 MED ORDER — LIDOCAINE HCL (PF) 1 % IJ SOLN
INTRAMUSCULAR | Status: AC
  Filled 2015-05-24: qty 5

## 2015-05-24 MED ORDER — ETOMIDATE 2 MG/ML IV SOLN
INTRAVENOUS | Status: AC
  Filled 2015-05-24: qty 10

## 2015-05-24 MED ORDER — GLUCAGON HCL RDNA (DIAGNOSTIC) 1 MG IJ SOLR
INTRAMUSCULAR | Status: AC
Start: 2015-05-24 — End: 2015-05-24
  Filled 2015-05-24: qty 2

## 2015-05-24 MED ORDER — MIDAZOLAM HCL 2 MG/2ML IJ SOLN
1.0000 mg | INTRAMUSCULAR | Status: DC | PRN
Start: 2015-05-24 — End: 2015-05-24
  Administered 2015-05-24 (×2): 2 mg via INTRAVENOUS

## 2015-05-24 MED ORDER — NEOSTIGMINE METHYLSULFATE 10 MG/10ML IV SOLN
INTRAVENOUS | Status: DC | PRN
  Administered 2015-05-24 (×2): 1 mg via INTRAVENOUS

## 2015-05-24 MED ORDER — INSULIN ASPART 100 UNIT/ML ~~LOC~~ SOLN
0.0000 [IU] | Freq: Three times a day (TID) | SUBCUTANEOUS | Status: DC
  Administered 2015-05-24: 5 [IU] via SUBCUTANEOUS
  Administered 2015-05-25: 3 [IU] via SUBCUTANEOUS
  Administered 2015-05-25: 1 [IU] via SUBCUTANEOUS

## 2015-05-24 MED ORDER — FENTANYL CITRATE (PF) 100 MCG/2ML IJ SOLN: 25.0000 ug | INTRAMUSCULAR | Status: DC | PRN

## 2015-05-24 MED ORDER — POTASSIUM CHLORIDE CRYS ER 20 MEQ PO TBCR
20.0000 meq | EXTENDED_RELEASE_TABLET | Freq: Three times a day (TID) | ORAL | Status: DC
  Administered 2015-05-24 – 2015-05-25 (×3): 20 meq via ORAL
  Filled 2015-05-24 (×3): qty 1

## 2015-05-24 MED ORDER — GLYCOPYRROLATE 0.2 MG/ML IJ SOLN
INTRAMUSCULAR | Status: AC
  Filled 2015-05-24: qty 1

## 2015-05-24 SURGICAL SUPPLY — 29 items
BAG HAMPER (MISCELLANEOUS) ×3 IMPLANT
BALLN RETRIEVAL 12X15 (BALLOONS) IMPLANT
BALLN RETRIEVAL 12X15MM (BALLOONS)
BALLN RETRIEVAL 9-12 (BALLOONS) ×2 IMPLANT
BALN RTRVL 200 6-7FR 12-15 (BALLOONS)
BALN RTRVL 6-7FR 9-12 BLW INJ (BALLOONS) ×1
BASKET TRAPEZOID 3X6 (MISCELLANEOUS) IMPLANT
BSKT STON RTRVL TRAPEZOID 3X6 (MISCELLANEOUS)
DEVICE INFLATION ENCORE 26 (MISCELLANEOUS) IMPLANT
DEVICE LOCKING W-BIOPSY CAP (MISCELLANEOUS) ×3 IMPLANT
GUIDEWIRE HYDRA JAGWIRE .35 (WIRE) IMPLANT
GUIDEWIRE JAG HINI 025X260CM (WIRE) IMPLANT
KIT ENDO PROCEDURE PEN (KITS) ×3 IMPLANT
KIT ROOM TURNOVER APOR (KITS) ×3 IMPLANT
LUBRICANT JELLY 4.5OZ STERILE (MISCELLANEOUS) ×2 IMPLANT
PAD ARMBOARD 7.5X6 YLW CONV (MISCELLANEOUS) ×3 IMPLANT
PATHFINDER 450CM 0.18 (STENTS) IMPLANT
POSITIONER HEAD 8X9X4 ADT (SOFTGOODS) IMPLANT
SCOPE SPY DS DISPOSABLE (MISCELLANEOUS) ×1 IMPLANT
SNARE ROTATE MED OVAL 20MM (MISCELLANEOUS) IMPLANT
SNARE SHORT THROW 13M SML OVAL (MISCELLANEOUS) IMPLANT
SPHINCTEROTOME AUTOTOME .25 (MISCELLANEOUS) ×2 IMPLANT
SPHINCTEROTOME HYDRATOME 44 (MISCELLANEOUS) ×3 IMPLANT
SYR 50ML LL SCALE MARK (SYRINGE) ×4 IMPLANT
SYSTEM CONTINUOUS INJECTION (MISCELLANEOUS) ×3 IMPLANT
TUBING INSUFFLATOR CO2MPACT (TUBING) ×3 IMPLANT
WALLSTENT METAL COVERED 10X60 (STENTS) IMPLANT
WALLSTENT METAL COVERED 10X80 (STENTS) IMPLANT
WATER STERILE IRR 1000ML POUR (IV SOLUTION) ×3 IMPLANT

## 2015-05-24 NOTE — Progress Notes (Signed)
Urine specimen collected in clean urinal.  Down via bed for ERCP.

## 2015-05-24 NOTE — Anesthesia Procedure Notes (Signed)
Procedure Name: Intubation Date/Time: 05/24/2015 9:53 AM Performed by: Charmaine Downs Pre-anesthesia Checklist: Patient identified, Emergency Drugs available, Suction available and Patient being monitored Patient Re-evaluated:Patient Re-evaluated prior to inductionOxygen Delivery Method: Circle system utilized Preoxygenation: Pre-oxygenation with 100% oxygen Intubation Type: IV induction, Rapid sequence and Cricoid Pressure applied Laryngoscope Size: Mac and 3 Grade View: Grade II Tube type: Oral Tube size: 8.0 mm Number of attempts: 1 Airway Equipment and Method: Stylet Placement Confirmation: ETT inserted through vocal cords under direct vision,  positive ETCO2 and breath sounds checked- equal and bilateral Secured at: 23 cm Tube secured with: Tape Difficulty Due To: Difficulty was anticipated, Difficult Airway- due to reduced neck mobility, Difficult Airway- due to anterior larynx, Difficult Airway- due to limited oral opening and Difficult Airway- due to dentition Future Recommendations: Recommend- induction with short-acting agent, and alternative techniques readily available

## 2015-05-24 NOTE — Care Management Note (Signed)
Case Management Note  Patient Details  Name: ZADRIAN RODDEY MRN: [id number] Date of Birth: [birth year]  Subjective/Objective:                  PT has recommended hoyer lift at DC. Pt will also need HH PT to provide family instruction on using the lift. Pt's wife made aware of and agreeable to DME and addition of PT onto [facility]  Action/Plan:  Romualdo Bolk, of [facility], made aware and will obtain pt info from chart and arrange for lift to be delivered.   Expected Discharge Date:  05/25/15               Expected Discharge Plan:  [place]  In-House Referral:  NA  Discharge planning Services  CM Consult  Post Acute Care Choice:  Resumption of Svcs/PTA Provider, Durable Medical Equipment, Home Health Choice offered to:  Patient  DME Arranged:  Other see comment DME Agency:  [facility] Arranged:  RN, PT, Nurse's Aide [place] Agency:  [facility]  Status of Service:  In process, will continue to follow  Medicare Important Message Given:    Date Medicare IM Given:    Medicare IM give by:    Date Additional Medicare IM Given:    Additional Medicare Important Message give by:     If discussed at [place] of Stay Meetings, dates discussed:    Additional Comments:  Sherald Barge, RN 05/24/2015, 12:19 PM

## 2015-05-24 NOTE — Anesthesia Postprocedure Evaluation (Signed)
  Anesthesia Post-op Note  Patient: Stephen Bates  Procedure(s) Performed: Procedure(s): ENDOSCOPIC RETROGRADE CHOLANGIOPANCREATOGRAPHY (N/A) BILIARY SPHINCTEROTOMY (N/A)  Patient Location: PACU  Anesthesia Type:General  Level of Consciousness: sedated  Airway and Oxygen Therapy: Patient Spontanous Breathing  Post-op Pain: none  Post-op Assessment: Post-op Vital signs reviewed, Patient's Cardiovascular Status Stable, Respiratory Function Stable, Patent Airway and No signs of Nausea or vomiting              Post-op Vital Signs: Reviewed and stable  Last Vitals:  Filed Vitals:   05/24/15 1115  BP: 121/52  Pulse: 58  Temp:   Resp: 17    Complications: No apparent anesthesia complications

## 2015-05-24 NOTE — Progress Notes (Signed)
Received report from endo S/P ERCP, no stents, VS stable, placed on O2 at 3L and Sat 98%, patient sleepy, HR 60, B/P 110/49. Scheduled for labs in AM.

## 2015-05-24 NOTE — Progress Notes (Signed)
Voided 125 ml orange/tea-colored urine, not in clean urinal for specimen to be collected.  Clean urinal given.

## 2015-05-24 NOTE — Progress Notes (Signed)
Subjective: Stephen Bates denies any pain this morning. Appreciate GI consult. ERCP is scheduled for today.  Objective: Vital signs in last 24 hours: Filed Vitals:   05/23/15 1300 05/23/15 1417 05/23/15 2052 05/24/15 0437  BP: 119/68 119/55 133/62 124/51  Pulse: 84 83 70 76  Temp: 98 F (36.7 C)  99.1 F (37.3 C) 98.4 F (36.9 C)  TempSrc: Oral  Oral Oral  Resp:  18 20 20   Height:      Weight:    124 lb 9.6 oz (56.518 kg)  SpO2: 94% 96% 96% 96%   Weight change: 9.6 oz (0.272 kg)  Intake/Output Summary (Last 24 hours) at 05/24/15 0728 Last data filed at 05/24/15 0615  Gross per 24 hour  Intake    773 ml  Output   1250 ml  Net   -477 ml    Physical Exam: Alert and comfortable. Jaundiced. Lungs clear. Heart regular with no murmurs. Abdomen soft and nontender with no organomegaly. Extremities reveal no edema.  Lab Results:    Results for orders placed or performed during the hospital encounter of 05/22/15 (from the past 24 hour(s))  Glucose, capillary     Status: Abnormal   Collection Time: 05/23/15  7:50 AM  Result Value Ref Range   Glucose-Capillary 172 (H) 65 - 99 mg/dL   Comment 1 Notify RN    Comment 2 Document in Chart   Glucose, capillary     Status: Abnormal   Collection Time: 05/23/15 11:11 AM  Result Value Ref Range   Glucose-Capillary 167 (H) 65 - 99 mg/dL   Comment 1 Notify RN    Comment 2 Document in Chart   Glucose, capillary     Status: Abnormal   Collection Time: 05/23/15  5:28 PM  Result Value Ref Range   Glucose-Capillary 249 (H) 65 - 99 mg/dL   Comment 1 Document in Chart   Protime-INR     Status: None   Collection Time: 05/23/15  6:21 PM  Result Value Ref Range   Prothrombin Time 14.2 11.6 - 15.2 seconds   INR 1.08 0.00 - 1.49  Glucose, capillary     Status: Abnormal   Collection Time: 05/23/15  8:52 PM  Result Value Ref Range   Glucose-Capillary 234 (H) 65 - 99 mg/dL   Comment 1 Notify RN    Comment 2 Document in Chart   CBC     Status:  Abnormal   Collection Time: 05/24/15  5:28 AM  Result Value Ref Range   WBC 11.8 (H) 4.0 - 10.5 K/uL   RBC 3.22 (L) 4.22 - 5.81 MIL/uL   Hemoglobin 11.4 (L) 13.0 - 17.0 g/dL   HCT 32.4 (L) 39.0 - 52.0 %   MCV 100.6 (H) 78.0 - 100.0 fL   MCH 35.4 (H) 26.0 - 34.0 pg   MCHC 35.2 30.0 - 36.0 g/dL   RDW 13.2 11.5 - 15.5 %   Platelets 345 150 - 400 K/uL  Comprehensive metabolic panel     Status: Abnormal   Collection Time: 05/24/15  5:29 AM  Result Value Ref Range   Sodium 131 (L) 135 - 145 mmol/L   Potassium 3.3 (L) 3.5 - 5.1 mmol/L   Chloride 98 (L) 101 - 111 mmol/L   CO2 28 22 - 32 mmol/L   Glucose, Bld 170 (H) 65 - 99 mg/dL   BUN 13 6 - 20 mg/dL   Creatinine, Ser 0.31 (L) 0.61 - 1.24 mg/dL   Calcium 8.9 8.9 - 10.3  mg/dL   Total Protein 6.1 (L) 6.5 - 8.1 g/dL   Albumin 2.4 (L) 3.5 - 5.0 g/dL   AST 248 (H) 15 - 41 U/L   ALT 256 (H) 17 - 63 U/L   Alkaline Phosphatase 501 (H) 38 - 126 U/L   Total Bilirubin 7.5 (H) 0.3 - 1.2 mg/dL   GFR calc non Af Amer >60 >60 mL/min   GFR calc Af Amer >60 >60 mL/min   Anion gap 5 5 - 15     ABGS No results for input(s): PHART, PO2ART, TCO2, HCO3 in the last 72 hours.  Invalid input(s): PCO2 CULTURES No results found for this or any previous visit (from the past 240 hour(s)). Studies/Results: Dg Chest 1 View  05/23/2015  CLINICAL DATA:  Follow-up pleural effusion EXAM: CHEST 1 VIEW COMPARISON:  Portable chest x-ray of October 31, 2014 FINDINGS: The lungs are hypoinflated. There is no focal infiltrate. Stable subtle density projecting over the lateral aspect of the left hemi thorax in the region of previous findings is noted. There may be a trace of pleural fluid bilaterally. Minimal subsegmental atelectasis at both lung bases is present. There is no alveolar infiltrate. The heart and pulmonary vascularity are normal. The mediastinum is normal in width. The bony thorax exhibits no acute abnormality. There are degenerative changes of the left  shoulder. IMPRESSION: There is no significant pleural effusion but the study is limited due to hypoinflation. Stable scarring peripherally in the left mid lung. No acute cardiopulmonary abnormality is observed. Electronically Signed   By: David  Martinique M.D.   On: 05/23/2015 08:19   Mr Abdomen W Wo Contrast  05/23/2015  CLINICAL DATA:  Indeterminate cystic pancreatic head lesion on CT study from 1 day prior. Elevated serum bilirubin an transaminitis. Hypoalbuminemia. History of prostate cancer. EXAM: MRI ABDOMEN WITHOUT AND WITH CONTRAST TECHNIQUE: Multiplanar multisequence MR imaging of the abdomen was performed both before and after the administration of intravenous contrast. CONTRAST:  53mL MULTIHANCE GADOBENATE DIMEGLUMINE 529 MG/ML IV SOLN COMPARISON:  05/22/2015 CT abdomen/ pelvis. FINDINGS: Many sequences are motion degraded. Lower chest: Small right pleural effusion with associated passive dependent right lower lobe atelectasis. Trace left pleural effusion with subsegmental left lower lobe atelectasis. Hepatobiliary: Normal liver size and configuration. No hepatic steatosis. Simple 0.5 cm left liver lobe cyst. No additional liver lesions. There are multiple tiny layering gallstones in the gallbladder fundus, largest 0.6 cm. There is also moderate layering sludge in the gallbladder. There is mild diffuse gallbladder wall thickening, with no pericholecystic fat stranding or fluid and no significant gallbladder distention. No intrahepatic biliary ductal dilatation. Normal caliber common bile duct, which measures 3 mm diameter. No evidence of choledocholithiasis. No evidence of a biliary stricture. Pancreas: No pancreas divisum. No main pancreatic duct dilation. There is a lobulated macrocystic 2.4 x 1.4 x 3.9 cm pancreatic mass in the pancreatic head (series 26/image 27 and series 3/image 22), which demonstrates a few thin internal enhancing septations and no enhancing solid nodular components, and appears  to demonstrate a connection with the ventral pancreatic duct on the MRCP sequence. There are greater than 10 additional scattered subcentimeter unilocular nonenhancing cystic pancreatic lesions scattered throughout the body and tail of the pancreas, largest 0.8 cm in the pancreatic body (series 6/image 23). No peripancreatic fat stranding or fluid collection . Spleen: Normal size. No mass. Adrenals/Urinary Tract: Normal adrenals. No hydronephrosis. 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 There is a minimally complex  0.6 cm upper left renal cyst with thin internal septation (Bosniak category 2). There are additional subcentimeter simple renal cysts in both kidneys. Stomach/Bowel: Grossly normal stomach. Visualized small and large bowel is normal caliber, with no bowel wall thickening. Vascular/Lymphatic: Atherosclerotic nonaneurysmal abdominal aorta. Patent portal, splenic, hepatic and renal veins. No pathologically enlarged lymph nodes in the abdomen. Other: No abdominal ascites or focal fluid collection. Musculoskeletal: No aggressive appearing focal osseous lesions. There are chronic T11, T12 and L1 vertebral body compression fracture status post vertebroplasty. There is a hemangioma in the T9 vertebral body. IMPRESSION: 1. Lobulated macrocystic pancreatic head mass measuring 2.4 x 1.4 x 3.9 cm, with thin enhancing internal septations and no enhancing solid nodular components, which demonstrates a connection to the nondilated main pancreatic duct. A side branch IPMN is favored. A follow-up MRI abdomen with and without intravenous contrast is recommended in 6 months. 2. Several (greater than 10) additional nonenhancing nonaggressive cystic pancreatic lesions scattered throughout the pancreas, likely additional small side branch IPMNs. 3. Cholelithiasis and gallbladder sludge. Mild diffuse gallbladder wall thickening, favored to be due to noninflammatory edema (such as due to the  patient's hypoalbuminemia) given the absence of pericholecystic fluid in the lack of significant gallbladder distention. 4. No biliary ductal dilatation. No evidence of choledocholithiasis. 5. Small right and trace left pleural effusions with bibasilar atelectasis. Electronically Signed   By: Ilona Sorrel M.D.   On: 05/23/2015 09:39   US Abdomen Complete  05/22/2015  CLINICAL DATA:  Jaundice EXAM: ULTRASOUND ABDOMEN COMPLETE COMPARISON:  None. FINDINGS: Gallbladder: Well distended without evidence of definitive cholelithiasis. Echogenic material is noted within consistent with gallbladder sludge. A negative sonographic Percell Miller sign is noted. Common bile duct: Diameter: 5 mm. Liver: No focal lesion identified. Within normal limits in parenchymal echogenicity. IVC: Not well visualized due to overlying bowel gas. Pancreas: Not well visualized due to overlying bowel gas. Spleen: Size and appearance within normal limits. Right Kidney: Length: 11.5 cm. 9 mm calcific density is noted which may represent a nonobstructing renal stone. Left Kidney: Length: 11.0 cm. Echogenicity within normal limits. No mass or hydronephrosis visualized. Abdominal aorta: No aneurysm visualized. Other findings: None. IMPRESSION: Calcification within the right kidney likely representing a nonobstructing renal stone. Gallbladder sludge. No other focal abnormality is seen. Electronically Signed   By: Inez Catalina M.D.   On: 05/22/2015 16:18   Ct Abdomen Pelvis W Contrast  05/22/2015  CLINICAL DATA:  Jaundice.  Prostate cancer. EXAM: CT ABDOMEN AND PELVIS WITH CONTRAST TECHNIQUE: Multidetector CT imaging of the abdomen and pelvis was performed using the standard protocol following bolus administration of intravenous contrast. CONTRAST:  189mL OMNIPAQUE IOHEXOL 300 MG/ML  SOLN COMPARISON:  Abdominal sonogram from earlier today. FINDINGS: Lower chest: Small right pleural effusion with moderate passive right lower lobe atelectasis. Segmental  basilar left lower lobe atelectasis. Left anterior descending, left circumflex and right coronary atherosclerosis. Hepatobiliary: Suggestion of diffuse hepatic steatosis. Hypodense 0.5 cm segment 2 left liver lobe lesion, too small to characterize. No additional liver lesions. There is slightly dense layering material in the fundal gallbladder, suggesting tiny gallstones. There is mild diffuse gallbladder wall thickening without pericholecystic fluid. There is no intrahepatic or extrahepatic biliary ductal dilatation. The common bile duct measures 3 mm diameter. Pancreas: There is a 1.6 x 1.3 x 2.8 cm lobulated cystic pancreatic lesion in the pancreatic head (series 3/ image 38 and series 5/image 45). No main pancreatic duct dilation. No additional pancreatic lesions. No peripancreatic fat stranding or  fluid. Spleen: Normal size. No mass. Adrenals/Urinary Tract: Normal adrenals. There are a few hypodense subcentimeter renal lesions in both kidneys, too small to characterize. No hydronephrosis. Mild diffuse bladder wall thickening. No appreciable focal bladder abnormality. Stomach/Bowel: Grossly normal stomach. Normal caliber small bowel with no small bowel wall thickening. Normal appendix . Normal large bowel with no diverticulosis, large bowel wall thickening or pericolonic fat stranding. Vascular/Lymphatic: Atherosclerotic nonaneurysmal abdominal aorta. Patent portal, splenic, hepatic and renal veins. No pathologically enlarged lymph nodes in the abdomen or pelvis. Reproductive: Numerous brachytherapy seeds are seen within the normal size prostate. Other: No pneumoperitoneum, ascites or focal fluid collection. Musculoskeletal: No aggressive appearing focal osseous lesions. Moderate to severe T11 and T12 and mild L1 chronic vertebral compression fractures status post vertebroplasty. Moderate degenerative changes throughout the visualized thoracolumbar spine. IMPRESSION: 1. Indeterminate lobulated cystic pancreatic  lesion in the pancreatic head measuring 1.6 x 1.3 x 2.8 cm, neoplasm not excluded. No biliary ductal dilatation. No main pancreatic duct dilation. Recommend further characterization with MRI abdomen with and without intravenous contrast. 2. Solitary subcentimeter left liver lobe hypodense lesion, too small to characterize. Suggestion of diffuse hepatic steatosis. 3. Dense material layering in the fundal gallbladder, suggesting tiny gallstones. Nonspecific diffuse gallbladder wall thickening. No pericholecystic fluid. Findings are nonspecific, and there was no evidence of acute cholecystitis on the abdominal sonogram from earlier today. 4. Small right pleural effusion.  Moderate bibasilar atelectasis. 5. Three-vessel coronary atherosclerosis. 6. Mild diffuse bladder wall thickening, nonspecific, likely due to chronic bladder voiding dysfunction. Correlate with urinalysis to exclude acute cystitis. Electronically Signed   By: Ilona Sorrel M.D.   On: 05/22/2015 21:31   Mr 3d Recon At Scanner  05/23/2015  CLINICAL DATA:  Indeterminate cystic pancreatic head lesion on CT study from 1 day prior. Elevated serum bilirubin an transaminitis. Hypoalbuminemia. History of prostate cancer. EXAM: MRI ABDOMEN WITHOUT AND WITH CONTRAST TECHNIQUE: Multiplanar multisequence MR imaging of the abdomen was performed both before and after the administration of intravenous contrast. CONTRAST:  74mL MULTIHANCE GADOBENATE DIMEGLUMINE 529 MG/ML IV SOLN COMPARISON:  05/22/2015 CT abdomen/ pelvis. FINDINGS: Many sequences are motion degraded. Lower chest: Small right pleural effusion with associated passive dependent right lower lobe atelectasis. Trace left pleural effusion with subsegmental left lower lobe atelectasis. Hepatobiliary: Normal liver size and configuration. No hepatic steatosis. Simple 0.5 cm left liver lobe cyst. No additional liver lesions. There are multiple tiny layering gallstones in the gallbladder fundus, largest 0.6  cm. There is also moderate layering sludge in the gallbladder. There is mild diffuse gallbladder wall thickening, with no pericholecystic fat stranding or fluid and no significant gallbladder distention. No intrahepatic biliary ductal dilatation. Normal caliber common bile duct, which measures 3 mm diameter. No evidence of choledocholithiasis. No evidence of a biliary stricture. Pancreas: No pancreas divisum. No main pancreatic duct dilation. There is a lobulated macrocystic 2.4 x 1.4 x 3.9 cm pancreatic mass in the pancreatic head (series 26/image 27 and series 3/image 22), which demonstrates a few thin internal enhancing septations and no enhancing solid nodular components, and appears to demonstrate a connection with the ventral pancreatic duct on the MRCP sequence. There are greater than 10 additional scattered subcentimeter unilocular nonenhancing cystic pancreatic lesions scattered throughout the body and tail of the pancreas, largest 0.8 cm in the pancreatic body (series 6/image 23). No peripancreatic fat stranding or fluid collection . Spleen: Normal size. No mass. Adrenals/Urinary Tract: Normal adrenals. No hydronephrosis. There is 0.9 cm angiomyolipoma in the lateral mid  to upper left kidney. There is a minimally complex 0.6 cm upper left renal cyst with thin internal septation (Bosniak category 2). There are additional subcentimeter simple renal cysts in both kidneys. Stomach/Bowel: Grossly normal stomach. Visualized small and large bowel is normal caliber, with no bowel wall thickening. Vascular/Lymphatic: Atherosclerotic nonaneurysmal abdominal aorta. Patent portal, splenic, hepatic and renal veins. No pathologically enlarged lymph nodes in the abdomen. Other: No abdominal ascites or focal fluid collection. Musculoskeletal: No aggressive appearing focal osseous lesions. There are chronic T11, T12 and L1 vertebral body compression fracture status post vertebroplasty. There is a hemangioma in the T9  vertebral body. IMPRESSION: 1. Lobulated macrocystic pancreatic head mass measuring 2.4 x 1.4 x 3.9 cm, with thin enhancing internal septations and no enhancing solid nodular components, which demonstrates a connection to the nondilated main pancreatic duct. A side branch IPMN is favored. A follow-up MRI abdomen with and without intravenous contrast is recommended in 6 months. 2. Several (greater than 10) additional nonenhancing nonaggressive cystic pancreatic lesions scattered throughout the pancreas, likely additional small side branch IPMNs. 3. Cholelithiasis and gallbladder sludge. Mild diffuse gallbladder wall thickening, favored to be due to noninflammatory edema (such as due to the patient's hypoalbuminemia) given the absence of pericholecystic fluid in the lack of significant gallbladder distention. 4. No biliary ductal dilatation. No evidence of choledocholithiasis. 5. Small right and trace left pleural effusions with bibasilar atelectasis. Electronically Signed   By: Ilona Sorrel M.D.   On: 05/23/2015 09:39   Micro Results: No results found for this or any previous visit (from the past 240 hour(s)). Studies/Results: Dg Chest 1 View  05/23/2015  CLINICAL DATA:  Follow-up pleural effusion EXAM: CHEST 1 VIEW COMPARISON:  Portable chest x-ray of October 31, 2014 FINDINGS: The lungs are hypoinflated. There is no focal infiltrate. Stable subtle density projecting over the lateral aspect of the left hemi thorax in the region of previous findings is noted. There may be a trace of pleural fluid bilaterally. Minimal subsegmental atelectasis at both lung bases is present. There is no alveolar infiltrate. The heart and pulmonary vascularity are normal. The mediastinum is normal in width. The bony thorax exhibits no acute abnormality. There are degenerative changes of the left shoulder. IMPRESSION: There is no significant pleural effusion but the study is limited due to hypoinflation. Stable scarring peripherally  in the left mid lung. No acute cardiopulmonary abnormality is observed. Electronically Signed   By: David  Martinique M.D.   On: 05/23/2015 08:19   Mr Abdomen W Wo Contrast  05/23/2015  CLINICAL DATA:  Indeterminate cystic pancreatic head lesion on CT study from 1 day prior. Elevated serum bilirubin an transaminitis. Hypoalbuminemia. History of prostate cancer. EXAM: MRI ABDOMEN WITHOUT AND WITH CONTRAST TECHNIQUE: Multiplanar multisequence MR imaging of the abdomen was performed both before and after the administration of intravenous contrast. CONTRAST:  69mL MULTIHANCE GADOBENATE DIMEGLUMINE 529 MG/ML IV SOLN COMPARISON:  05/22/2015 CT abdomen/ pelvis. FINDINGS: Many sequences are motion degraded. Lower chest: Small right pleural effusion with associated passive dependent right lower lobe atelectasis. Trace left pleural effusion with subsegmental left lower lobe atelectasis. Hepatobiliary: Normal liver size and configuration. No hepatic steatosis. Simple 0.5 cm left liver lobe cyst. No additional liver lesions. There are multiple tiny layering gallstones in the gallbladder fundus, largest 0.6 cm. There is also moderate layering sludge in the gallbladder. There is mild diffuse gallbladder wall thickening, with no pericholecystic fat stranding or fluid and no significant gallbladder distention. No intrahepatic biliary ductal dilatation. Normal  caliber common bile duct, which measures 3 mm diameter. No evidence of choledocholithiasis. No evidence of a biliary stricture. Pancreas: No pancreas divisum. No main pancreatic duct dilation. There is a lobulated macrocystic 2.4 x 1.4 x 3.9 cm pancreatic mass in the pancreatic head (series 26/image 27 and series 3/image 22), which demonstrates a few thin internal enhancing septations and no enhancing solid nodular components, and appears to demonstrate a connection with the ventral pancreatic duct on the MRCP sequence. There are greater than 10 additional scattered  subcentimeter unilocular nonenhancing cystic pancreatic lesions scattered throughout the body and tail of the pancreas, largest 0.8 cm in the pancreatic body (series 6/image 23). No peripancreatic fat stranding or fluid collection . Spleen: Normal size. No mass. Adrenals/Urinary Tract: Normal adrenals. No hydronephrosis. 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 There is a minimally complex 0.6 cm upper left renal cyst with thin internal septation (Bosniak category 2). There are additional subcentimeter simple renal cysts in both kidneys. Stomach/Bowel: Grossly normal stomach. Visualized small and large bowel is normal caliber, with no bowel wall thickening. Vascular/Lymphatic: Atherosclerotic nonaneurysmal abdominal aorta. Patent portal, splenic, hepatic and renal veins. No pathologically enlarged lymph nodes in the abdomen. Other: No abdominal ascites or focal fluid collection. Musculoskeletal: No aggressive appearing focal osseous lesions. There are chronic T11, T12 and L1 vertebral body compression fracture status post vertebroplasty. There is a hemangioma in the T9 vertebral body. IMPRESSION: 1. Lobulated macrocystic pancreatic head mass measuring 2.4 x 1.4 x 3.9 cm, with thin enhancing internal septations and no enhancing solid nodular components, which demonstrates a connection to the nondilated main pancreatic duct. A side branch IPMN is favored. A follow-up MRI abdomen with and without intravenous contrast is recommended in 6 months. 2. Several (greater than 10) additional nonenhancing nonaggressive cystic pancreatic lesions scattered throughout the pancreas, likely additional small side branch IPMNs. 3. Cholelithiasis and gallbladder sludge. Mild diffuse gallbladder wall thickening, favored to be due to noninflammatory edema (such as due to the patient's hypoalbuminemia) given the absence of pericholecystic fluid in the lack of significant gallbladder distention. 4. No biliary  ductal dilatation. No evidence of choledocholithiasis. 5. Small right and trace left pleural effusions with bibasilar atelectasis. Electronically Signed   By: Ilona Sorrel M.D.   On: 05/23/2015 09:39   US Abdomen Complete  05/22/2015  CLINICAL DATA:  Jaundice EXAM: ULTRASOUND ABDOMEN COMPLETE COMPARISON:  None. FINDINGS: Gallbladder: Well distended without evidence of definitive cholelithiasis. Echogenic material is noted within consistent with gallbladder sludge. A negative sonographic Percell Miller sign is noted. Common bile duct: Diameter: 5 mm. Liver: No focal lesion identified. Within normal limits in parenchymal echogenicity. IVC: Not well visualized due to overlying bowel gas. Pancreas: Not well visualized due to overlying bowel gas. Spleen: Size and appearance within normal limits. Right Kidney: Length: 11.5 cm. 9 mm calcific density is noted which may represent a nonobstructing renal stone. Left Kidney: Length: 11.0 cm. Echogenicity within normal limits. No mass or hydronephrosis visualized. Abdominal aorta: No aneurysm visualized. Other findings: None. IMPRESSION: Calcification within the right kidney likely representing a nonobstructing renal stone. Gallbladder sludge. No other focal abnormality is seen. Electronically Signed   By: Inez Catalina M.D.   On: 05/22/2015 16:18   Ct Abdomen Pelvis W Contrast  05/22/2015  CLINICAL DATA:  Jaundice.  Prostate cancer. EXAM: CT ABDOMEN AND PELVIS WITH CONTRAST TECHNIQUE: Multidetector CT imaging of the abdomen and pelvis was performed using the standard protocol following bolus administration of  intravenous contrast. CONTRAST:  111mL OMNIPAQUE IOHEXOL 300 MG/ML  SOLN COMPARISON:  Abdominal sonogram from earlier today. FINDINGS: Lower chest: Small right pleural effusion with moderate passive right lower lobe atelectasis. Segmental basilar left lower lobe atelectasis. Left anterior descending, left circumflex and right coronary atherosclerosis. Hepatobiliary:  Suggestion of diffuse hepatic steatosis. Hypodense 0.5 cm segment 2 left liver lobe lesion, too small to characterize. No additional liver lesions. There is slightly dense layering material in the fundal gallbladder, suggesting tiny gallstones. There is mild diffuse gallbladder wall thickening without pericholecystic fluid. There is no intrahepatic or extrahepatic biliary ductal dilatation. The common bile duct measures 3 mm diameter. Pancreas: There is a 1.6 x 1.3 x 2.8 cm lobulated cystic pancreatic lesion in the pancreatic head (series 3/ image 38 and series 5/image 45). No main pancreatic duct dilation. No additional pancreatic lesions. No peripancreatic fat stranding or fluid. Spleen: Normal size. No mass. Adrenals/Urinary Tract: Normal adrenals. There are a few hypodense subcentimeter renal lesions in both kidneys, too small to characterize. No hydronephrosis. Mild diffuse bladder wall thickening. No appreciable focal bladder abnormality. Stomach/Bowel: Grossly normal stomach. Normal caliber small bowel with no small bowel wall thickening. Normal appendix . Normal large bowel with no diverticulosis, large bowel wall thickening or pericolonic fat stranding. Vascular/Lymphatic: Atherosclerotic nonaneurysmal abdominal aorta. Patent portal, splenic, hepatic and renal veins. No pathologically enlarged lymph nodes in the abdomen or pelvis. Reproductive: Numerous brachytherapy seeds are seen within the normal size prostate. Other: No pneumoperitoneum, ascites or focal fluid collection. Musculoskeletal: No aggressive appearing focal osseous lesions. Moderate to severe T11 and T12 and mild L1 chronic vertebral compression fractures status post vertebroplasty. Moderate degenerative changes throughout the visualized thoracolumbar spine. IMPRESSION: 1. Indeterminate lobulated cystic pancreatic lesion in the pancreatic head measuring 1.6 x 1.3 x 2.8 cm, neoplasm not excluded. No biliary ductal dilatation. No main  pancreatic duct dilation. Recommend further characterization with MRI abdomen with and without intravenous contrast. 2. Solitary subcentimeter left liver lobe hypodense lesion, too small to characterize. Suggestion of diffuse hepatic steatosis. 3. Dense material layering in the fundal gallbladder, suggesting tiny gallstones. Nonspecific diffuse gallbladder wall thickening. No pericholecystic fluid. Findings are nonspecific, and there was no evidence of acute cholecystitis on the abdominal sonogram from earlier today. 4. Small right pleural effusion.  Moderate bibasilar atelectasis. 5. Three-vessel coronary atherosclerosis. 6. Mild diffuse bladder wall thickening, nonspecific, likely due to chronic bladder voiding dysfunction. Correlate with urinalysis to exclude acute cystitis. Electronically Signed   By: Ilona Sorrel M.D.   On: 05/22/2015 21:31   Mr 3d Recon At Scanner  05/23/2015  CLINICAL DATA:  Indeterminate cystic pancreatic head lesion on CT study from 1 day prior. Elevated serum bilirubin an transaminitis. Hypoalbuminemia. History of prostate cancer. EXAM: MRI ABDOMEN WITHOUT AND WITH CONTRAST TECHNIQUE: Multiplanar multisequence MR imaging of the abdomen was performed both before and after the administration of intravenous contrast. CONTRAST:  15mL MULTIHANCE GADOBENATE DIMEGLUMINE 529 MG/ML IV SOLN COMPARISON:  05/22/2015 CT abdomen/ pelvis. FINDINGS: Many sequences are motion degraded. Lower chest: Small right pleural effusion with associated passive dependent right lower lobe atelectasis. Trace left pleural effusion with subsegmental left lower lobe atelectasis. Hepatobiliary: Normal liver size and configuration. No hepatic steatosis. Simple 0.5 cm left liver lobe cyst. No additional liver lesions. There are multiple tiny layering gallstones in the gallbladder fundus, largest 0.6 cm. There is also moderate layering sludge in the gallbladder. There is mild diffuse gallbladder wall thickening, with no  pericholecystic fat stranding or fluid and  no significant gallbladder distention. No intrahepatic biliary ductal dilatation. Normal caliber common bile duct, which measures 3 mm diameter. No evidence of choledocholithiasis. No evidence of a biliary stricture. Pancreas: No pancreas divisum. No main pancreatic duct dilation. There is a lobulated macrocystic 2.4 x 1.4 x 3.9 cm pancreatic mass in the pancreatic head (series 26/image 27 and series 3/image 22), which demonstrates a few thin internal enhancing septations and no enhancing solid nodular components, and appears to demonstrate a connection with the ventral pancreatic duct on the MRCP sequence. There are greater than 10 additional scattered subcentimeter unilocular nonenhancing cystic pancreatic lesions scattered throughout the body and tail of the pancreas, largest 0.8 cm in the pancreatic body (series 6/image 23). No peripancreatic fat stranding or fluid collection . Spleen: Normal size. No mass. Adrenals/Urinary Tract: Normal adrenals. No hydronephrosis. 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 There is a minimally complex 0.6 cm upper left renal cyst with thin internal septation (Bosniak category 2). There are additional subcentimeter simple renal cysts in both kidneys. Stomach/Bowel: Grossly normal stomach. Visualized small and large bowel is normal caliber, with no bowel wall thickening. Vascular/Lymphatic: Atherosclerotic nonaneurysmal abdominal aorta. Patent portal, splenic, hepatic and renal veins. No pathologically enlarged lymph nodes in the abdomen. Other: No abdominal ascites or focal fluid collection. Musculoskeletal: No aggressive appearing focal osseous lesions. There are chronic T11, T12 and L1 vertebral body compression fracture status post vertebroplasty. There is a hemangioma in the T9 vertebral body. IMPRESSION: 1. Lobulated macrocystic pancreatic head mass measuring 2.4 x 1.4 x 3.9 cm, with thin enhancing  internal septations and no enhancing solid nodular components, which demonstrates a connection to the nondilated main pancreatic duct. A side branch IPMN is favored. A follow-up MRI abdomen with and without intravenous contrast is recommended in 6 months. 2. Several (greater than 10) additional nonenhancing nonaggressive cystic pancreatic lesions scattered throughout the pancreas, likely additional small side branch IPMNs. 3. Cholelithiasis and gallbladder sludge. Mild diffuse gallbladder wall thickening, favored to be due to noninflammatory edema (such as due to the patient's hypoalbuminemia) given the absence of pericholecystic fluid in the lack of significant gallbladder distention. 4. No biliary ductal dilatation. No evidence of choledocholithiasis. 5. Small right and trace left pleural effusions with bibasilar atelectasis. Electronically Signed   By: Ilona Sorrel M.D.   On: 05/23/2015 09:39   Medications:  I have reviewed the patient's current medications Scheduled Meds: . feeding supplement (ENSURE ENLIVE)  237 mL Oral BID BM  . folic acid  1 mg Oral Daily  . insulin aspart  0-5 Units Subcutaneous QHS  . insulin aspart  0-9 Units Subcutaneous TID WC  . mirtazapine  30 mg Oral QHS  . multivitamin with minerals  1 tablet Oral Daily  . potassium chloride  20 mEq Oral TID  . sodium chloride  3 mL Intravenous Q12H  . thiamine  100 mg Intravenous Daily   Continuous Infusions: . sodium chloride 20 mL/hr at 05/24/15 0615   PRN Meds:.sodium chloride, acetaminophen **OR** acetaminophen, alum & mag hydroxide-simeth, ondansetron **OR** ondansetron (ZOFRAN) IV, sodium chloride   Assessment/Plan: #1. Pancreatic mass. LFTs improved today. MRI consistent with IPMN. ERCP today. #2. Diabetes. Stable. Glucose 170. Heme 11 A1c 6.3. #3. Protein calorie malnutrition. Appreciate nutrition consult.  Albumin 2.4. #4. Decubitus ulcer. Continue current treatment. #5. Hypokalemia. Replace orally. Active  Problems:   RUQ abdominal pain   Pressure ulcer   Malnutrition of moderate degree     LOS:  2 days   Breyah Akhter 05/24/2015, 7:28 AM

## 2015-05-24 NOTE — Progress Notes (Signed)
Oxygen 100% on 3L.  Cut oxygen off.  Oxygen level 95% on room air.  Left oxygen off.  Will continue to monitor.

## 2015-05-24 NOTE — Op Note (Signed)
ERCP PROCEDURE REPORT  PATIENT:  Stephen Bates  MR#:  [id number] Birthdate:  June 11, 1927, 81 y.o., male Endoscopist:  Dr. Rogene Houston, MD Referred By:  Dr. Asencion Noble, MD Procedure Date: 05/24/2015  Procedure:   ERCP    Indications: Patient is 81 year old Caucasian male who presents with painless jaundice. Workup reveals dominant cystic lesion in head of the pancreas along with multiple smaller lesions. He is suspected to have obstructive jaundice but biliary dilation not seen. He does have cholelithiasis. He drinks alcohol regularly but his illness is not felt to be due to alcohol hepatotoxicity.            Informed Consent:  The risks, benefits, limitations, alternatives, and mponderable have been reviewed with the patient. I specifically discussed a 1 in 10 chance of pancreatitis, reaction to medications, bleeding, perforation and the possibility of a failed ERCP. Potential for sphincterotomy and stent placement also reviewed. Questions have been answered. All parties agreeable.  Please see history & physical in medical record for more information.  Medications:  Gen. endotracheal anesthesia. *Please see anesthesia record for complete details  Description of procedure:  Procedure performed in the OR. The patient was placed under anesthesia, intubated, and turned into semipermanent position. Therapeutic Pentax video duodenoscope passed through the oropharynx without any difficulty into the esophagus, stomach, and across the pylorus and pull, and descending duodenum.  Rx 44 autotome was used for cannulation along with 035 hydrajag wire. PD was cannulated once with guidewire but contrast not injected. CBD was easily cannulated using guidewire. Dilute contrast was used for the procedure.  Findings:  Normal ampulla of Vater. Normal caliber to CHD and CBD. CBD was slightly larger than CHD. No filling defects noted. Patent cystic duct with spilling of contrast into gallbladder with filling  defects. Intrahepatic system was partially filled and no abnormality noted. Biliary sphincterotomy performed.  Therapeutic/Diagnostic Maneuvers Performed:  See above.  Complications:  None  EBL: None  Impression:  No evidence of choledocholithiasis or biliary stricture. Cholelithiasis. Biliary sphincterotomy performed. Pancreatic duct was not studied.  Comment: Cystic pancreatic neoplasm does not appear to be impinging upon the bile duct as suspected on MRCP. Liver injury most likely secondary to alcohol use.  Recommendations:  Advance diet. No acetaminophen. No aspirin or anticoagulants for 72 hours. Repeat lab in a.m.  [name],[name]  05/24/2015  10:47 AM  CC: Dr. Asencion Noble, MD & Dr. Rayne Du ref. provider found

## 2015-05-24 NOTE — Progress Notes (Signed)
Patient has no complaints. According to his wife he ate at least half of his lunch. He denies nausea or abdominal pain. Repeat lab in a.m. 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 Would try to refrain from using acetaminophen for now.

## 2015-05-24 NOTE — Transfer of Care (Signed)
Immediate Anesthesia Transfer of Care Note  Patient: Stephen Bates  Procedure(s) Performed: Procedure(s): ENDOSCOPIC RETROGRADE CHOLANGIOPANCREATOGRAPHY (N/A)  Patient Location: PACU  Anesthesia Type:General  Level of Consciousness: awake and patient cooperative  Airway & Oxygen Therapy: Patient Spontanous Breathing and Patient connected to face mask oxygen  Post-op Assessment: Report given to RN, Post -op Vital signs reviewed and stable and Patient moving all extremities  Post vital signs: Reviewed and stable  Last Vitals:  Filed Vitals:   05/24/15 0935  BP: 93/54  Pulse:   Temp:   Resp: 17    Complications: No apparent anesthesia complications

## 2015-05-24 NOTE — Progress Notes (Signed)
Patient has no complaints. He denies abdominal pain.  LFTs reviewed.  Bilirubin is 7.5, AP 501, AST 248 and ALT 256. Albumin is 2.4. INR 1.08. Serum potassium 3.3.  Lab studies reviewed with patient and his wife. He is agreeable to proceeding with ERCP.

## 2015-05-24 NOTE — Progress Notes (Signed)
Returned to room via bed, Alert, talking, wife @ bedside, SCD's in use, vitals taken by endo nurse and recorded.  Denies pain or discomfort, voice hoarse.  Will continue to monitor.

## 2015-05-24 NOTE — Anesthesia Preprocedure Evaluation (Addendum)
Anesthesia Evaluation  Patient identified by MRN, date of birth, ID band Patient awake    Reviewed: Allergy & Precautions, NPO status , Patient's Chart, lab work & pertinent test results  Airway Mallampati: II  TM Distance: >3 FB     Dental  (+) Poor Dentition   Pulmonary former smoker,    breath sounds clear to auscultation       Cardiovascular hypertension, Pt. on medications  Rhythm:Regular Rate:Normal     Neuro/Psych Mild dementia  Parkinsonism   Gait difficulty    Neuromuscular disease CVA    GI/Hepatic   Endo/Other  diabetes, Type 2  Renal/GU      Musculoskeletal   Abdominal   Peds  Hematology   Anesthesia Other Findings   Reproductive/Obstetrics                             Anesthesia Physical Anesthesia Plan  ASA: IV  Anesthesia Plan: General   Post-op Pain Management:    Induction: Intravenous  Airway Management Planned: Oral ETT  Additional Equipment:   Intra-op Plan:   Post-operative Plan: Extubation in OR  Informed Consent: I have reviewed the patients History and Physical, chart, labs and discussed the procedure including the risks, benefits and alternatives for the proposed anesthesia with the patient or authorized representative who has indicated his/her understanding and acceptance.     Plan Discussed with:   Anesthesia Plan Comments:         Anesthesia Quick Evaluation

## 2015-05-25 ENCOUNTER — Encounter (HOSPITAL_COMMUNITY): Payer: Self-pay | Admitting: Internal Medicine

## 2015-05-25 DIAGNOSIS — R17 Unspecified jaundice: Secondary | ICD-10-CM | POA: Diagnosis not present

## 2015-05-25 LAB — COMPREHENSIVE METABOLIC PANEL
ALK PHOS: 482 U/L — AB (ref 38–126)
ALT: 225 U/L — AB (ref 17–63)
AST: 200 U/L — AB (ref 15–41)
Albumin: 2.4 g/dL — ABNORMAL LOW (ref 3.5–5.0)
Anion gap: 4 — ABNORMAL LOW (ref 5–15)
BILIRUBIN TOTAL: 6.1 mg/dL — AB (ref 0.3–1.2)
BUN: 13 mg/dL (ref 6–20)
CALCIUM: 8.9 mg/dL (ref 8.9–10.3)
CO2: 28 mmol/L (ref 22–32)
CREATININE: 0.38 mg/dL — AB (ref 0.61–1.24)
Chloride: 102 mmol/L (ref 101–111)
Glucose, Bld: 129 mg/dL — ABNORMAL HIGH (ref 65–99)
Potassium: 4.1 mmol/L (ref 3.5–5.1)
Sodium: 134 mmol/L — ABNORMAL LOW (ref 135–145)
TOTAL PROTEIN: 6.1 g/dL — AB (ref 6.5–8.1)

## 2015-05-25 LAB — CBC
HEMATOCRIT: 30.5 % — AB (ref 39.0–52.0)
Hemoglobin: 10.7 g/dL — ABNORMAL LOW (ref 13.0–17.0)
MCH: 35.8 pg — ABNORMAL HIGH (ref 26.0–34.0)
MCHC: 35.1 g/dL (ref 30.0–36.0)
MCV: 102 fL — AB (ref 78.0–100.0)
Platelets: 376 10*3/uL (ref 150–400)
RBC: 2.99 MIL/uL — ABNORMAL LOW (ref 4.22–5.81)
RDW: 13.8 % (ref 11.5–15.5)
WBC: 13.2 10*3/uL — AB (ref 4.0–10.5)

## 2015-05-25 LAB — AMYLASE: AMYLASE: 81 U/L (ref 28–100)

## 2015-05-25 LAB — PROTIME-INR
INR: 1.06 (ref 0.00–1.49)
PROTHROMBIN TIME: 14 s (ref 11.6–15.2)

## 2015-05-25 LAB — GLUCOSE, CAPILLARY
Glucose-Capillary: 143 mg/dL — ABNORMAL HIGH (ref 65–99)
Glucose-Capillary: 209 mg/dL — ABNORMAL HIGH (ref 65–99)

## 2015-05-25 NOTE — Discharge Planning (Signed)
Pt IV removed.  Pt DC paper given, explained and educated.  Pt told of suggested FU appts.  VSS and RN assessment revealed stability for DC to home.  EMS contacted and will be transporting pt home between 1400-1500.

## 2015-05-25 NOTE — Progress Notes (Signed)
Subjective: Stephen Bates denies any abdominal pain this morning following his ERCP yesterday.  Objective: Vital signs in last 24 hours: Filed Vitals:   05/24/15 1145 05/24/15 1205 05/24/15 2028 05/25/15 0556  BP: 110/49 111/50 110/59 119/51  Pulse: 62 65 84 84  Temp:  97.5 F (36.4 C) 99.3 F (37.4 C) 98 F (36.7 C)  TempSrc:   Oral Oral  Resp: 17 18 20 20   Height:      Weight:    124 lb 14.4 oz (56.654 kg)  SpO2: 95% 99% 93% 95%   Weight change: -9.6 oz (-0.272 kg)  Intake/Output Summary (Last 24 hours) at 05/25/15 0727 Last data filed at 05/25/15 0557  Gross per 24 hour  Intake    800 ml  Output    650 ml  Net    150 ml    Physical Exam: Alert and oriented. Lungs clear. Heart regular with no murmurs. Abdomen is soft and nontender with no palpable organomegaly.  Lab Results:    Results for orders placed or performed during the hospital encounter of 05/22/15 (from the past 24 hour(s))  Glucose, capillary     Status: Abnormal   Collection Time: 05/24/15  7:38 AM  Result Value Ref Range   Glucose-Capillary 170 (H) 65 - 99 mg/dL   Comment 1 Document in Chart   Glucose, capillary     Status: Abnormal   Collection Time: 05/24/15  8:41 AM  Result Value Ref Range   Glucose-Capillary 178 (H) 65 - 99 mg/dL  Glucose, capillary     Status: Abnormal   Collection Time: 05/24/15 11:06 AM  Result Value Ref Range   Glucose-Capillary 212 (H) 65 - 99 mg/dL  Urinalysis, Routine w reflex microscopic (not at [facility])     Status: Abnormal   Collection Time: 05/24/15 11:44 AM  Result Value Ref Range   Color, Urine AMBER (A) YELLOW   APPearance CLEAR CLEAR   Specific Gravity, Urine 1.010 1.005 - 1.030   pH 7.0 5.0 - 8.0   Glucose, UA 100 (A) NEGATIVE mg/dL   Hgb urine dipstick NEGATIVE NEGATIVE   Bilirubin Urine LARGE (A) NEGATIVE   Ketones, ur 15 (A) NEGATIVE mg/dL   Protein, ur NEGATIVE NEGATIVE mg/dL   Nitrite NEGATIVE NEGATIVE   Leukocytes, UA TRACE (A) NEGATIVE  Urine  microscopic-add on     Status: Abnormal   Collection Time: 05/24/15 11:44 AM  Result Value Ref Range   Squamous Epithelial / LPF 0-5 (A) NONE SEEN   WBC, UA 0-5 0 - 5 WBC/hpf   RBC / HPF 0-5 0 - 5 RBC/hpf   Bacteria, UA MANY (A) NONE SEEN   Casts GRANULAR CAST (A) NEGATIVE   Crystals TRIPLE PHOSPHATE CRYSTALS (A) NEGATIVE  Glucose, capillary     Status: Abnormal   Collection Time: 05/24/15  4:16 PM  Result Value Ref Range   Glucose-Capillary 258 (H) 65 - 99 mg/dL   Comment 1 Notify RN    Comment 2 Document in Chart   Glucose, capillary     Status: Abnormal   Collection Time: 05/24/15  8:26 PM  Result Value Ref Range   Glucose-Capillary 212 (H) 65 - 99 mg/dL   Comment 1 Notify RN    Comment 2 Document in Chart      ABGS No results for input(s): PHART, PO2ART, TCO2, HCO3 in the last 72 hours.  Invalid input(s): PCO2 CULTURES No results found for this or any previous visit (from the past 240 hour(s)). Studies/Results: Dg  Chest 1 View  05/23/2015  CLINICAL DATA:  Follow-up pleural effusion EXAM: CHEST 1 VIEW COMPARISON:  Portable chest x-ray of October 31, 2014 FINDINGS: The lungs are hypoinflated. There is no focal infiltrate. Stable subtle density projecting over the lateral aspect of the left hemi thorax in the region of previous findings is noted. There may be a trace of pleural fluid bilaterally. Minimal subsegmental atelectasis at both lung bases is present. There is no alveolar infiltrate. The heart and pulmonary vascularity are normal. The mediastinum is normal in width. The bony thorax exhibits no acute abnormality. There are degenerative changes of the left shoulder. IMPRESSION: There is no significant pleural effusion but the study is limited due to hypoinflation. Stable scarring peripherally in the left mid lung. No acute cardiopulmonary abnormality is observed. Electronically Signed   By: David  Martinique M.D.   On: 05/23/2015 08:19   Mr Abdomen W Wo Contrast  05/23/2015   CLINICAL DATA:  Indeterminate cystic pancreatic head lesion on CT study from 1 day prior. Elevated serum bilirubin an transaminitis. Hypoalbuminemia. History of prostate cancer. EXAM: MRI ABDOMEN WITHOUT AND WITH CONTRAST TECHNIQUE: Multiplanar multisequence MR imaging of the abdomen was performed both before and after the administration of intravenous contrast. CONTRAST:  60mL MULTIHANCE GADOBENATE DIMEGLUMINE 529 MG/ML IV SOLN COMPARISON:  05/22/2015 CT abdomen/ pelvis. FINDINGS: Many sequences are motion degraded. Lower chest: Small right pleural effusion with associated passive dependent right lower lobe atelectasis. Trace left pleural effusion with subsegmental left lower lobe atelectasis. Hepatobiliary: Normal liver size and configuration. No hepatic steatosis. Simple 0.5 cm left liver lobe cyst. No additional liver lesions. There are multiple tiny layering gallstones in the gallbladder fundus, largest 0.6 cm. There is also moderate layering sludge in the gallbladder. There is mild diffuse gallbladder wall thickening, with no pericholecystic fat stranding or fluid and no significant gallbladder distention. No intrahepatic biliary ductal dilatation. Normal caliber common bile duct, which measures 3 mm diameter. No evidence of choledocholithiasis. No evidence of a biliary stricture. Pancreas: No pancreas divisum. No main pancreatic duct dilation. There is a lobulated macrocystic 2.4 x 1.4 x 3.9 cm pancreatic mass in the pancreatic head (series 26/image 27 and series 3/image 22), which demonstrates a few thin internal enhancing septations and no enhancing solid nodular components, and appears to demonstrate a connection with the ventral pancreatic duct on the MRCP sequence. There are greater than 10 additional scattered subcentimeter unilocular nonenhancing cystic pancreatic lesions scattered throughout the body and tail of the pancreas, largest 0.8 cm in the pancreatic body (series 6/image 23). No peripancreatic  fat stranding or fluid collection . Spleen: Normal size. No mass. Adrenals/Urinary Tract: Normal adrenals. No hydronephrosis. 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 There is a minimally complex 0.6 cm upper left renal cyst with thin internal septation (Bosniak category 2). There are additional subcentimeter simple renal cysts in both kidneys. Stomach/Bowel: Grossly normal stomach. Visualized small and large bowel is normal caliber, with no bowel wall thickening. Vascular/Lymphatic: Atherosclerotic nonaneurysmal abdominal aorta. Patent portal, splenic, hepatic and renal veins. No pathologically enlarged lymph nodes in the abdomen. Other: No abdominal ascites or focal fluid collection. Musculoskeletal: No aggressive appearing focal osseous lesions. There are chronic T11, T12 and L1 vertebral body compression fracture status post vertebroplasty. There is a hemangioma in the T9 vertebral body. IMPRESSION: 1. Lobulated macrocystic pancreatic head mass measuring 2.4 x 1.4 x 3.9 cm, with thin enhancing internal septations and no enhancing solid nodular components, which  demonstrates a connection to the nondilated main pancreatic duct. A side branch IPMN is favored. A follow-up MRI abdomen with and without intravenous contrast is recommended in 6 months. 2. Several (greater than 10) additional nonenhancing nonaggressive cystic pancreatic lesions scattered throughout the pancreas, likely additional small side branch IPMNs. 3. Cholelithiasis and gallbladder sludge. Mild diffuse gallbladder wall thickening, favored to be due to noninflammatory edema (such as due to the patient's hypoalbuminemia) given the absence of pericholecystic fluid in the lack of significant gallbladder distention. 4. No biliary ductal dilatation. No evidence of choledocholithiasis. 5. Small right and trace left pleural effusions with bibasilar atelectasis. Electronically Signed   By: Ilona Sorrel M.D.   On: 05/23/2015  09:39   Mr 3d Recon At Scanner  05/23/2015  CLINICAL DATA:  Indeterminate cystic pancreatic head lesion on CT study from 1 day prior. Elevated serum bilirubin an transaminitis. Hypoalbuminemia. History of prostate cancer. EXAM: MRI ABDOMEN WITHOUT AND WITH CONTRAST TECHNIQUE: Multiplanar multisequence MR imaging of the abdomen was performed both before and after the administration of intravenous contrast. CONTRAST:  9mL MULTIHANCE GADOBENATE DIMEGLUMINE 529 MG/ML IV SOLN COMPARISON:  05/22/2015 CT abdomen/ pelvis. FINDINGS: Many sequences are motion degraded. Lower chest: Small right pleural effusion with associated passive dependent right lower lobe atelectasis. Trace left pleural effusion with subsegmental left lower lobe atelectasis. Hepatobiliary: Normal liver size and configuration. No hepatic steatosis. Simple 0.5 cm left liver lobe cyst. No additional liver lesions. There are multiple tiny layering gallstones in the gallbladder fundus, largest 0.6 cm. There is also moderate layering sludge in the gallbladder. There is mild diffuse gallbladder wall thickening, with no pericholecystic fat stranding or fluid and no significant gallbladder distention. No intrahepatic biliary ductal dilatation. Normal caliber common bile duct, which measures 3 mm diameter. No evidence of choledocholithiasis. No evidence of a biliary stricture. Pancreas: No pancreas divisum. No main pancreatic duct dilation. There is a lobulated macrocystic 2.4 x 1.4 x 3.9 cm pancreatic mass in the pancreatic head (series 26/image 27 and series 3/image 22), which demonstrates a few thin internal enhancing septations and no enhancing solid nodular components, and appears to demonstrate a connection with the ventral pancreatic duct on the MRCP sequence. There are greater than 10 additional scattered subcentimeter unilocular nonenhancing cystic pancreatic lesions scattered throughout the body and tail of the pancreas, largest 0.8 cm in the  pancreatic body (series 6/image 23). No peripancreatic fat stranding or fluid collection . Spleen: Normal size. No mass. Adrenals/Urinary Tract: Normal adrenals. No hydronephrosis. 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 There is a minimally complex 0.6 cm upper left renal cyst with thin internal septation (Bosniak category 2). There are additional subcentimeter simple renal cysts in both kidneys. Stomach/Bowel: Grossly normal stomach. Visualized small and large bowel is normal caliber, with no bowel wall thickening. Vascular/Lymphatic: Atherosclerotic nonaneurysmal abdominal aorta. Patent portal, splenic, hepatic and renal veins. No pathologically enlarged lymph nodes in the abdomen. Other: No abdominal ascites or focal fluid collection. Musculoskeletal: No aggressive appearing focal osseous lesions. There are chronic T11, T12 and L1 vertebral body compression fracture status post vertebroplasty. There is a hemangioma in the T9 vertebral body. IMPRESSION: 1. Lobulated macrocystic pancreatic head mass measuring 2.4 x 1.4 x 3.9 cm, with thin enhancing internal septations and no enhancing solid nodular components, which demonstrates a connection to the nondilated main pancreatic duct. A side branch IPMN is favored. A follow-up MRI abdomen with and without intravenous contrast is recommended in 6 months.  2. Several (greater than 10) additional nonenhancing nonaggressive cystic pancreatic lesions scattered throughout the pancreas, likely additional small side branch IPMNs. 3. Cholelithiasis and gallbladder sludge. Mild diffuse gallbladder wall thickening, favored to be due to noninflammatory edema (such as due to the patient's hypoalbuminemia) given the absence of pericholecystic fluid in the lack of significant gallbladder distention. 4. No biliary ductal dilatation. No evidence of choledocholithiasis. 5. Small right and trace left pleural effusions with bibasilar atelectasis.  Electronically Signed   By: Ilona Sorrel M.D.   On: 05/23/2015 09:39   Dg Ercp Biliary & Pancreatic Ducts  05/24/2015  CLINICAL DATA:  Pancreatic duct mass, now with jaundice. EXAM: ERCP TECHNIQUE: Multiple spot images obtained with the fluoroscopic device and submitted for interpretation post-procedure. COMPARISON:  MRCP -05/23/2015; CT abdomen pelvis - 05/22/2015 FINDINGS: 11 spot intraoperative radiographic images of the right upper abdominal quadrant during ERCP are provided for review. Initial image demonstrates an ERCP probe overlying the right upper abdominal quadrant. Subsequent images demonstrate selective cannulation opacification of the common bile duct. There are no definitive discrete persistent filling defects within the opacified portion of the common bile duct. There is minimal opacification of the cystic duct and gallbladder. There are several persistent filling defects seen within the gallbladder compatible with cholelithiasis. IMPRESSION: 1. ERCP as above. 2. Cholelithiasis without definite evidence of choledocholithiasis. These images were submitted for radiologic interpretation only. Please see the procedural report for the amount of contrast and the fluoroscopy time utilized. Electronically Signed   By: Sandi Mariscal M.D.   On: 05/24/2015 11:07   Micro Results: No results found for this or any previous visit (from the past 240 hour(s)). Studies/Results: Dg Chest 1 View  05/23/2015  CLINICAL DATA:  Follow-up pleural effusion EXAM: CHEST 1 VIEW COMPARISON:  Portable chest x-ray of October 31, 2014 FINDINGS: The lungs are hypoinflated. There is no focal infiltrate. Stable subtle density projecting over the lateral aspect of the left hemi thorax in the region of previous findings is noted. There may be a trace of pleural fluid bilaterally. Minimal subsegmental atelectasis at both lung bases is present. There is no alveolar infiltrate. The heart and pulmonary vascularity are normal. The  mediastinum is normal in width. The bony thorax exhibits no acute abnormality. There are degenerative changes of the left shoulder. IMPRESSION: There is no significant pleural effusion but the study is limited due to hypoinflation. Stable scarring peripherally in the left mid lung. No acute cardiopulmonary abnormality is observed. Electronically Signed   By: David  Martinique M.D.   On: 05/23/2015 08:19   Mr Abdomen W Wo Contrast  05/23/2015  CLINICAL DATA:  Indeterminate cystic pancreatic head lesion on CT study from 1 day prior. Elevated serum bilirubin an transaminitis. Hypoalbuminemia. History of prostate cancer. EXAM: MRI ABDOMEN WITHOUT AND WITH CONTRAST TECHNIQUE: Multiplanar multisequence MR imaging of the abdomen was performed both before and after the administration of intravenous contrast. CONTRAST:  61mL MULTIHANCE GADOBENATE DIMEGLUMINE 529 MG/ML IV SOLN COMPARISON:  05/22/2015 CT abdomen/ pelvis. FINDINGS: Many sequences are motion degraded. Lower chest: Small right pleural effusion with associated passive dependent right lower lobe atelectasis. Trace left pleural effusion with subsegmental left lower lobe atelectasis. Hepatobiliary: Normal liver size and configuration. No hepatic steatosis. Simple 0.5 cm left liver lobe cyst. No additional liver lesions. There are multiple tiny layering gallstones in the gallbladder fundus, largest 0.6 cm. There is also moderate layering sludge in the gallbladder. There is mild diffuse gallbladder wall thickening, with no pericholecystic fat stranding  or fluid and no significant gallbladder distention. No intrahepatic biliary ductal dilatation. Normal caliber common bile duct, which measures 3 mm diameter. No evidence of choledocholithiasis. No evidence of a biliary stricture. Pancreas: No pancreas divisum. No main pancreatic duct dilation. There is a lobulated macrocystic 2.4 x 1.4 x 3.9 cm pancreatic mass in the pancreatic head (series 26/image 27 and series 3/image  22), which demonstrates a few thin internal enhancing septations and no enhancing solid nodular components, and appears to demonstrate a connection with the ventral pancreatic duct on the MRCP sequence. There are greater than 10 additional scattered subcentimeter unilocular nonenhancing cystic pancreatic lesions scattered throughout the body and tail of the pancreas, largest 0.8 cm in the pancreatic body (series 6/image 23). No peripancreatic fat stranding or fluid collection . Spleen: Normal size. No mass. Adrenals/Urinary Tract: Normal adrenals. No hydronephrosis. 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 There is a minimally complex 0.6 cm upper left renal cyst with thin internal septation (Bosniak category 2). There are additional subcentimeter simple renal cysts in both kidneys. Stomach/Bowel: Grossly normal stomach. Visualized small and large bowel is normal caliber, with no bowel wall thickening. Vascular/Lymphatic: Atherosclerotic nonaneurysmal abdominal aorta. Patent portal, splenic, hepatic and renal veins. No pathologically enlarged lymph nodes in the abdomen. Other: No abdominal ascites or focal fluid collection. Musculoskeletal: No aggressive appearing focal osseous lesions. There are chronic T11, T12 and L1 vertebral body compression fracture status post vertebroplasty. There is a hemangioma in the T9 vertebral body. IMPRESSION: 1. Lobulated macrocystic pancreatic head mass measuring 2.4 x 1.4 x 3.9 cm, with thin enhancing internal septations and no enhancing solid nodular components, which demonstrates a connection to the nondilated main pancreatic duct. A side branch IPMN is favored. A follow-up MRI abdomen with and without intravenous contrast is recommended in 6 months. 2. Several (greater than 10) additional nonenhancing nonaggressive cystic pancreatic lesions scattered throughout the pancreas, likely additional small side branch IPMNs. 3. Cholelithiasis and  gallbladder sludge. Mild diffuse gallbladder wall thickening, favored to be due to noninflammatory edema (such as due to the patient's hypoalbuminemia) given the absence of pericholecystic fluid in the lack of significant gallbladder distention. 4. No biliary ductal dilatation. No evidence of choledocholithiasis. 5. Small right and trace left pleural effusions with bibasilar atelectasis. Electronically Signed   By: Ilona Sorrel M.D.   On: 05/23/2015 09:39   Mr 3d Recon At Scanner  05/23/2015  CLINICAL DATA:  Indeterminate cystic pancreatic head lesion on CT study from 1 day prior. Elevated serum bilirubin an transaminitis. Hypoalbuminemia. History of prostate cancer. EXAM: MRI ABDOMEN WITHOUT AND WITH CONTRAST TECHNIQUE: Multiplanar multisequence MR imaging of the abdomen was performed both before and after the administration of intravenous contrast. CONTRAST:  59mL MULTIHANCE GADOBENATE DIMEGLUMINE 529 MG/ML IV SOLN COMPARISON:  05/22/2015 CT abdomen/ pelvis. FINDINGS: Many sequences are motion degraded. Lower chest: Small right pleural effusion with associated passive dependent right lower lobe atelectasis. Trace left pleural effusion with subsegmental left lower lobe atelectasis. Hepatobiliary: Normal liver size and configuration. No hepatic steatosis. Simple 0.5 cm left liver lobe cyst. No additional liver lesions. There are multiple tiny layering gallstones in the gallbladder fundus, largest 0.6 cm. There is also moderate layering sludge in the gallbladder. There is mild diffuse gallbladder wall thickening, with no pericholecystic fat stranding or fluid and no significant gallbladder distention. No intrahepatic biliary ductal dilatation. Normal caliber common bile duct, which measures 3 mm diameter. No evidence of choledocholithiasis. No evidence of  a biliary stricture. Pancreas: No pancreas divisum. No main pancreatic duct dilation. There is a lobulated macrocystic 2.4 x 1.4 x 3.9 cm pancreatic mass in the  pancreatic head (series 26/image 27 and series 3/image 22), which demonstrates a few thin internal enhancing septations and no enhancing solid nodular components, and appears to demonstrate a connection with the ventral pancreatic duct on the MRCP sequence. There are greater than 10 additional scattered subcentimeter unilocular nonenhancing cystic pancreatic lesions scattered throughout the body and tail of the pancreas, largest 0.8 cm in the pancreatic body (series 6/image 23). No peripancreatic fat stranding or fluid collection . Spleen: Normal size. No mass. Adrenals/Urinary Tract: Normal adrenals. No hydronephrosis. 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 There is a minimally complex 0.6 cm upper left renal cyst with thin internal septation (Bosniak category 2). There are additional subcentimeter simple renal cysts in both kidneys. Stomach/Bowel: Grossly normal stomach. Visualized small and large bowel is normal caliber, with no bowel wall thickening. Vascular/Lymphatic: Atherosclerotic nonaneurysmal abdominal aorta. Patent portal, splenic, hepatic and renal veins. No pathologically enlarged lymph nodes in the abdomen. Other: No abdominal ascites or focal fluid collection. Musculoskeletal: No aggressive appearing focal osseous lesions. There are chronic T11, T12 and L1 vertebral body compression fracture status post vertebroplasty. There is a hemangioma in the T9 vertebral body. IMPRESSION: 1. Lobulated macrocystic pancreatic head mass measuring 2.4 x 1.4 x 3.9 cm, with thin enhancing internal septations and no enhancing solid nodular components, which demonstrates a connection to the nondilated main pancreatic duct. A side branch IPMN is favored. A follow-up MRI abdomen with and without intravenous contrast is recommended in 6 months. 2. Several (greater than 10) additional nonenhancing nonaggressive cystic pancreatic lesions scattered throughout the pancreas, likely additional  small side branch IPMNs. 3. Cholelithiasis and gallbladder sludge. Mild diffuse gallbladder wall thickening, favored to be due to noninflammatory edema (such as due to the patient's hypoalbuminemia) given the absence of pericholecystic fluid in the lack of significant gallbladder distention. 4. No biliary ductal dilatation. No evidence of choledocholithiasis. 5. Small right and trace left pleural effusions with bibasilar atelectasis. Electronically Signed   By: Ilona Sorrel M.D.   On: 05/23/2015 09:39   Dg Ercp Biliary & Pancreatic Ducts  05/24/2015  CLINICAL DATA:  Pancreatic duct mass, now with jaundice. EXAM: ERCP TECHNIQUE: Multiple spot images obtained with the fluoroscopic device and submitted for interpretation post-procedure. COMPARISON:  MRCP -05/23/2015; CT abdomen pelvis - 05/22/2015 FINDINGS: 11 spot intraoperative radiographic images of the right upper abdominal quadrant during ERCP are provided for review. Initial image demonstrates an ERCP probe overlying the right upper abdominal quadrant. Subsequent images demonstrate selective cannulation opacification of the common bile duct. There are no definitive discrete persistent filling defects within the opacified portion of the common bile duct. There is minimal opacification of the cystic duct and gallbladder. There are several persistent filling defects seen within the gallbladder compatible with cholelithiasis. IMPRESSION: 1. ERCP as above. 2. Cholelithiasis without definite evidence of choledocholithiasis. These images were submitted for radiologic interpretation only. Please see the procedural report for the amount of contrast and the fluoroscopy time utilized. Electronically Signed   By: Sandi Mariscal M.D.   On: 05/24/2015 11:07   Medications:  I have reviewed the patient's current medications Scheduled Meds: . feeding supplement (ENSURE ENLIVE)  237 mL Oral BID BM  . folic acid  1 mg Oral Daily  . insulin aspart  0-5 Units Subcutaneous QHS   .  insulin aspart  0-9 Units Subcutaneous TID WC  . mirtazapine  30 mg Oral QHS  . multivitamin with minerals  1 tablet Oral Daily  . potassium chloride  20 mEq Oral TID  . sodium chloride  3 mL Intravenous Q12H  . thiamine  100 mg Intravenous Daily   Continuous Infusions:  PRN Meds:.sodium chloride, alum & mag hydroxide-simeth, ondansetron **OR** ondansetron (ZOFRAN) IV, sodium chloride   Assessment/Plan: #1. Jaundice. No evidence of obstruction on ERCP. Laboratory studies pending for today. He appears to have no complications from this procedure. Jaundice may likely be related to his alcohol use which has been 3-4 drinks of bourbon per day. He shows no signs of alcohol withdrawal and has been treated with thiamine, folate and a multivitamin while hospitalized. #2. I'll PMN. #3. Diabetes. Continue sliding scale NovoLog. #4. Pressure ulcer. Continue local therapy. Active Problems:   RUQ abdominal pain   Pressure ulcer   Malnutrition of moderate degree     LOS: 3 days   Stephen Bates 05/25/2015, 7:27 AM

## 2015-05-25 NOTE — Care Management Note (Signed)
Case Management Note  Patient Details  Name: Stephen Bates MRN: [id number] Date of Birth: [birth year]  Pt will DC home today with wife and resumption of [facility] services through [facility]. Pt's wife is aware HH has 48 hours to resume services. Romualdo Bolk, of [facility], updated on DC today and she will obtain pt info from chart.   Expected Discharge Date:  05/25/15               Expected Discharge Plan:  [place]  In-House Referral:  NA  Discharge planning Services  CM Consult  Post Acute Care Choice:  Resumption of Svcs/PTA Provider, Durable Medical Equipment, Home Health Choice offered to:  Patient  DME Arranged:  Other see comment DME Agency:  [place] Arranged:  RN, PT, Nurse's Aide [place] Agency:  [place]  Status of Service:  Completed, signed off  Medicare Important Message Given:  Yes Date Medicare IM Given:    Medicare IM give by:    Date Additional Medicare IM Given:    Additional Medicare Important Message give by:     If discussed at [place] of Stay Meetings, dates discussed:    Additional Comments:  Sherald Barge, RN 05/25/2015, 8:20 AM

## 2015-05-25 NOTE — Discharge Planning (Signed)
EMS notified me of their concern regarding UTI.  Spoke w/Dr. Willey Blade at 1600 who was aware of UTI and is awaiting culture results.  Dr. Willey Blade stated that he will contact pt regarding culture results.

## 2015-05-25 NOTE — Progress Notes (Signed)
  Subjective: Patient has no complaints. He denies abdominal pain nausea or vomiting.  Objective: Blood pressure 119/51, pulse 84, temperature 98 F (36.7 C), temperature source Oral, resp. rate 20, height 5\' 4"  (1.626 m), weight 124 lb 14.4 oz (56.654 kg), SpO2 95 %. Patient is alert. He is eating breakfast. He appears less jaundiced. Abdomen is soft and nontender.  Labs/studies Results:   Recent Labs  05/23/15 0518 05/24/15 0528  WBC 11.2* 11.8*  HGB 12.5* 11.4*  HCT 35.1* 32.4*  PLT 339 345    BMET   Recent Labs  05/22/15 1420 05/24/15 0529 05/25/15 0527  NA 130* 131* 134*  K 3.8 3.3* 4.1  CL 96* 98* 102  CO2 27 28 28   GLUCOSE 233* 170* 129*  BUN 14 13 13   CREATININE 0.31* 0.31* 0.38*  CALCIUM 9.0 8.9 8.9    LFT   Recent Labs  05/23/15 0518 05/24/15 0529 05/25/15 0527  PROT 6.6 6.1* 6.1*  ALBUMIN 2.5* 2.4* 2.4*  AST 364* 248* 200*  ALT 289* 256* 225*  ALKPHOS 521* 501* 482*  BILITOT 8.1* 7.5* 6.1*  BILIDIR 5.4*  --   --   IBILI 2.7*  --   --     PT/INR   Recent Labs  05/23/15 1821 05/25/15 0527  LABPROT 14.2 14.0  INR 1.08 1.06    Serum amylase is 81.   CA-19-9 is 101.  Assessment:  #1. Jaundice. He has both cholestatic and hepatocellular injury pattern. ERCP yesterday was negative for stones or stricture. Sphincterotomy performed to prevent risk of biliary pancreatitis since he has cholelithiasis. Continued improvement in LFTs. #2. Multiple pancreatic cystic lesions. He possibly has IPMN. Will continue to monitor. Mildly elevated CA-19-9 appears to be nonspecific.  Recommendations: Patient advised to refrain from drinking alcohol. Agree with discharge planning. LFTs in one week.

## 2015-05-25 NOTE — Discharge Summary (Signed)
Physician Discharge Summary  Stephen Bates [id number] DOB: 10-10-1926 DOA: 05/22/2015   Admit date: 05/22/2015 Discharge date: 05/25/2015  Discharge Diagnoses: #1. Alcoholic hepatitis. #2. IPMN #3. Cholelithiasis. #4 diabetes #5 sacral decubitus #6 protein calorie malnutrition  Active Problems:   RUQ abdominal pain   Pressure ulcer   Malnutrition of moderate degree    Wt Readings from Last 3 Encounters:  05/25/15 124 lb 14.4 oz (56.654 kg)  03/28/14 122 lb (55.339 kg)  03/02/14 122 lb 3.2 oz (55.43 kg)     Hospital Course:  This patient is an 81 year old male who presented with jaundice. Bilirubin was 8. Transaminases and alkaline phosphatase were significantly elevated. Initially underwent an ultrasound of the abdomen which revealed no bile duct dilation. He then had a CT and MRI of the abdomen which revealed a cystic pancreatic head mass. He was seen in consultation by GI and underwent an ERCP. Bile duct was not obstructed. He is felt to have anIPMN. He has been drinking 3-4 drinks of alcohol per day. He was treated with thiamine, multivitamins and folic acid. He showed no signs of withdrawal. LFTs have modestly improved thus far. Today's LFTs are pending. He has had no abdominal pain since his ERCP and has been able to eat.  He has a sacral decubitus ulcer which will be treated and followed by home health.  His diabetes has been stable. Hypertension has been controlled with benazepril.  His condition at discharge is improved. He'll be seen in follow-up in one week. He has been counseled to avoid alcohol and acetaminophen.   Discharge Instructions     Medication List    STOP taking these medications        acetaminophen 325 MG tablet  Commonly known as:  TYLENOL      TAKE these medications        AMBULATORY NON FORMULARY MEDICATION  1 Device by Does not apply route daily. Lift Chair     aspirin EC 81 MG tablet  Take 81 mg by mouth daily.     benazepril  40 MG tablet  Commonly known as:  LOTENSIN  Take 1 tablet by mouth every morning.     CENTRUM SILVER ADULT 50+ PO  Take 1 tablet by mouth every morning.     docusate sodium 100 MG capsule  Commonly known as:  COLACE  Take 100 mg by mouth daily as needed for mild constipation.     mirtazapine 30 MG tablet  Commonly known as:  REMERON  Take 30 mg by mouth at bedtime.     polyethylene glycol packet  Commonly known as:  MIRALAX / GLYCOLAX  Take 17 g by mouth daily as needed for mild constipation.         [name] 05/25/2015

## 2015-05-25 NOTE — Addendum Note (Signed)
Addendum  created 05/25/15 1327 by Charmaine Downs, CRNA   Modules edited: Notes Section   Notes Section:  File: [id number]

## 2015-05-25 NOTE — Anesthesia Postprocedure Evaluation (Signed)
  Anesthesia Post-op Note  Patient: Stephen Bates  Procedure(s) Performed: Procedure(s): ENDOSCOPIC RETROGRADE CHOLANGIOPANCREATOGRAPHY (N/A) BILIARY SPHINCTEROTOMY (N/A)  Patient Location: room 303  Anesthesia Type:General  Level of Consciousness: awake, alert , oriented and patient cooperative  Airway and Oxygen Therapy: Patient Spontanous Breathing  Post-op Pain: none  Post-op Assessment: Post-op Vital signs reviewed, Patient's Cardiovascular Status Stable, Respiratory Function Stable, Patent Airway, No signs of Nausea or vomiting and Adequate PO intake              Post-op Vital Signs: Reviewed and stable  Last Vitals:  Filed Vitals:   05/25/15 0556  BP: 119/51  Pulse: 84  Temp: 36.7 C  Resp: 20    Complications: No apparent anesthesia complications

## 2015-05-26 LAB — URINE CULTURE
Culture: 7000
Special Requests: NORMAL

## 2015-05-27 DIAGNOSIS — M1990 Unspecified osteoarthritis, unspecified site: Secondary | ICD-10-CM | POA: Diagnosis not present

## 2015-05-27 DIAGNOSIS — Z8673 Personal history of transient ischemic attack (TIA), and cerebral infarction without residual deficits: Secondary | ICD-10-CM | POA: Diagnosis not present

## 2015-05-27 DIAGNOSIS — G20 Parkinson's disease: Secondary | ICD-10-CM | POA: Diagnosis not present

## 2015-05-27 DIAGNOSIS — I10 Essential (primary) hypertension: Secondary | ICD-10-CM | POA: Diagnosis not present

## 2015-05-27 DIAGNOSIS — L89151 Pressure ulcer of sacral region, stage 1: Secondary | ICD-10-CM | POA: Diagnosis not present

## 2015-05-27 DIAGNOSIS — I4510 Unspecified right bundle-branch block: Secondary | ICD-10-CM | POA: Diagnosis not present

## 2015-05-28 DIAGNOSIS — L89151 Pressure ulcer of sacral region, stage 1: Secondary | ICD-10-CM | POA: Diagnosis not present

## 2015-05-28 DIAGNOSIS — Z8673 Personal history of transient ischemic attack (TIA), and cerebral infarction without residual deficits: Secondary | ICD-10-CM | POA: Diagnosis not present

## 2015-05-28 DIAGNOSIS — I10 Essential (primary) hypertension: Secondary | ICD-10-CM | POA: Diagnosis not present

## 2015-05-28 DIAGNOSIS — I4510 Unspecified right bundle-branch block: Secondary | ICD-10-CM | POA: Diagnosis not present

## 2015-05-28 DIAGNOSIS — M1990 Unspecified osteoarthritis, unspecified site: Secondary | ICD-10-CM | POA: Diagnosis not present

## 2015-05-28 DIAGNOSIS — G20 Parkinson's disease: Secondary | ICD-10-CM | POA: Diagnosis not present

## 2015-05-29 DIAGNOSIS — G20 Parkinson's disease: Secondary | ICD-10-CM | POA: Diagnosis not present

## 2015-05-29 DIAGNOSIS — I4510 Unspecified right bundle-branch block: Secondary | ICD-10-CM | POA: Diagnosis not present

## 2015-05-29 DIAGNOSIS — M1990 Unspecified osteoarthritis, unspecified site: Secondary | ICD-10-CM | POA: Diagnosis not present

## 2015-05-29 DIAGNOSIS — I10 Essential (primary) hypertension: Secondary | ICD-10-CM | POA: Diagnosis not present

## 2015-05-29 DIAGNOSIS — L89151 Pressure ulcer of sacral region, stage 1: Secondary | ICD-10-CM | POA: Diagnosis not present

## 2015-05-29 DIAGNOSIS — Z8673 Personal history of transient ischemic attack (TIA), and cerebral infarction without residual deficits: Secondary | ICD-10-CM | POA: Diagnosis not present

## 2015-05-30 DIAGNOSIS — Z8673 Personal history of transient ischemic attack (TIA), and cerebral infarction without residual deficits: Secondary | ICD-10-CM | POA: Diagnosis not present

## 2015-05-30 DIAGNOSIS — I10 Essential (primary) hypertension: Secondary | ICD-10-CM | POA: Diagnosis not present

## 2015-05-30 DIAGNOSIS — G20 Parkinson's disease: Secondary | ICD-10-CM | POA: Diagnosis not present

## 2015-05-30 DIAGNOSIS — M1990 Unspecified osteoarthritis, unspecified site: Secondary | ICD-10-CM | POA: Diagnosis not present

## 2015-05-30 DIAGNOSIS — L89151 Pressure ulcer of sacral region, stage 1: Secondary | ICD-10-CM | POA: Diagnosis not present

## 2015-05-30 DIAGNOSIS — I4510 Unspecified right bundle-branch block: Secondary | ICD-10-CM | POA: Diagnosis not present

## 2015-06-01 DIAGNOSIS — K7010 Alcoholic hepatitis without ascites: Secondary | ICD-10-CM | POA: Diagnosis not present

## 2015-06-01 DIAGNOSIS — L89152 Pressure ulcer of sacral region, stage 2: Secondary | ICD-10-CM | POA: Diagnosis not present

## 2015-06-01 DIAGNOSIS — Z85828 Personal history of other malignant neoplasm of skin: Secondary | ICD-10-CM | POA: Diagnosis not present

## 2015-06-01 DIAGNOSIS — I10 Essential (primary) hypertension: Secondary | ICD-10-CM | POA: Diagnosis not present

## 2015-06-01 DIAGNOSIS — D136 Benign neoplasm of pancreas: Secondary | ICD-10-CM | POA: Diagnosis not present

## 2015-06-01 DIAGNOSIS — N400 Enlarged prostate without lower urinary tract symptoms: Secondary | ICD-10-CM | POA: Diagnosis not present

## 2015-06-01 DIAGNOSIS — K8020 Calculus of gallbladder without cholecystitis without obstruction: Secondary | ICD-10-CM | POA: Diagnosis not present

## 2015-06-01 DIAGNOSIS — Z8546 Personal history of malignant neoplasm of prostate: Secondary | ICD-10-CM | POA: Diagnosis not present

## 2015-06-01 DIAGNOSIS — Z8673 Personal history of transient ischemic attack (TIA), and cerebral infarction without residual deficits: Secondary | ICD-10-CM | POA: Diagnosis not present

## 2015-06-01 DIAGNOSIS — E119 Type 2 diabetes mellitus without complications: Secondary | ICD-10-CM | POA: Diagnosis not present

## 2015-06-01 DIAGNOSIS — M1990 Unspecified osteoarthritis, unspecified site: Secondary | ICD-10-CM | POA: Diagnosis not present

## 2015-06-01 DIAGNOSIS — L89520 Pressure ulcer of left ankle, unstageable: Secondary | ICD-10-CM | POA: Diagnosis not present

## 2015-06-01 DIAGNOSIS — G20 Parkinson's disease: Secondary | ICD-10-CM | POA: Diagnosis not present

## 2015-06-01 DIAGNOSIS — I4510 Unspecified right bundle-branch block: Secondary | ICD-10-CM | POA: Diagnosis not present

## 2015-06-04 DIAGNOSIS — G20 Parkinson's disease: Secondary | ICD-10-CM | POA: Diagnosis not present

## 2015-06-04 DIAGNOSIS — E119 Type 2 diabetes mellitus without complications: Secondary | ICD-10-CM | POA: Diagnosis not present

## 2015-06-04 DIAGNOSIS — L89152 Pressure ulcer of sacral region, stage 2: Secondary | ICD-10-CM | POA: Diagnosis not present

## 2015-06-04 DIAGNOSIS — I10 Essential (primary) hypertension: Secondary | ICD-10-CM | POA: Diagnosis not present

## 2015-06-04 DIAGNOSIS — D136 Benign neoplasm of pancreas: Secondary | ICD-10-CM | POA: Diagnosis not present

## 2015-06-04 DIAGNOSIS — K7010 Alcoholic hepatitis without ascites: Secondary | ICD-10-CM | POA: Diagnosis not present

## 2015-06-05 ENCOUNTER — Other Ambulatory Visit (HOSPITAL_COMMUNITY)
Admission: RE | Admit: 2015-06-05 | Discharge: 2015-06-05 | Disposition: A | Discharge status: Home / Self Care | Payer: Medicare Other | Source: Other Acute Inpatient Hospital | Attending: Internal Medicine | Admitting: Internal Medicine

## 2015-06-05 DIAGNOSIS — G20 Parkinson's disease: Secondary | ICD-10-CM | POA: Diagnosis not present

## 2015-06-05 DIAGNOSIS — K7010 Alcoholic hepatitis without ascites: Secondary | ICD-10-CM | POA: Diagnosis not present

## 2015-06-05 DIAGNOSIS — E119 Type 2 diabetes mellitus without complications: Secondary | ICD-10-CM | POA: Diagnosis not present

## 2015-06-05 DIAGNOSIS — D136 Benign neoplasm of pancreas: Secondary | ICD-10-CM | POA: Diagnosis not present

## 2015-06-05 DIAGNOSIS — L89152 Pressure ulcer of sacral region, stage 2: Secondary | ICD-10-CM | POA: Diagnosis not present

## 2015-06-05 DIAGNOSIS — I10 Essential (primary) hypertension: Secondary | ICD-10-CM | POA: Diagnosis not present

## 2015-06-05 LAB — COMPREHENSIVE METABOLIC PANEL
ALK PHOS: 281 U/L — AB (ref 38–126)
ALT: 66 U/L — AB (ref 17–63)
AST: 48 U/L — AB (ref 15–41)
Albumin: 3.6 g/dL (ref 3.5–5.0)
Anion gap: 9 (ref 5–15)
BILIRUBIN TOTAL: 1.8 mg/dL — AB (ref 0.3–1.2)
BUN: 9 mg/dL (ref 6–20)
CALCIUM: 10 mg/dL (ref 8.9–10.3)
CO2: 26 mmol/L (ref 22–32)
CREATININE: 0.47 mg/dL — AB (ref 0.61–1.24)
Chloride: 97 mmol/L — ABNORMAL LOW (ref 101–111)
GFR calc Af Amer: >60 mL/min (ref >60)
GLUCOSE: 127 mg/dL — AB (ref 65–99)
Potassium: 4.6 mmol/L (ref 3.5–5.1)
Sodium: 132 mmol/L — ABNORMAL LOW (ref 135–145)
Total Protein: 7.5 g/dL (ref 6.5–8.1)

## 2015-06-06 DIAGNOSIS — K7010 Alcoholic hepatitis without ascites: Secondary | ICD-10-CM | POA: Diagnosis not present

## 2015-06-06 DIAGNOSIS — I10 Essential (primary) hypertension: Secondary | ICD-10-CM | POA: Diagnosis not present

## 2015-06-06 DIAGNOSIS — G20 Parkinson's disease: Secondary | ICD-10-CM | POA: Diagnosis not present

## 2015-06-06 DIAGNOSIS — L89152 Pressure ulcer of sacral region, stage 2: Secondary | ICD-10-CM | POA: Diagnosis not present

## 2015-06-06 DIAGNOSIS — D136 Benign neoplasm of pancreas: Secondary | ICD-10-CM | POA: Diagnosis not present

## 2015-06-06 DIAGNOSIS — E119 Type 2 diabetes mellitus without complications: Secondary | ICD-10-CM | POA: Diagnosis not present

## 2015-06-07 DIAGNOSIS — L89152 Pressure ulcer of sacral region, stage 2: Secondary | ICD-10-CM | POA: Diagnosis not present

## 2015-06-07 DIAGNOSIS — E119 Type 2 diabetes mellitus without complications: Secondary | ICD-10-CM | POA: Diagnosis not present

## 2015-06-07 DIAGNOSIS — G20 Parkinson's disease: Secondary | ICD-10-CM | POA: Diagnosis not present

## 2015-06-07 DIAGNOSIS — D136 Benign neoplasm of pancreas: Secondary | ICD-10-CM | POA: Diagnosis not present

## 2015-06-07 DIAGNOSIS — K7010 Alcoholic hepatitis without ascites: Secondary | ICD-10-CM | POA: Diagnosis not present

## 2015-06-07 DIAGNOSIS — I10 Essential (primary) hypertension: Secondary | ICD-10-CM | POA: Diagnosis not present

## 2015-06-08 DIAGNOSIS — E119 Type 2 diabetes mellitus without complications: Secondary | ICD-10-CM | POA: Diagnosis not present

## 2015-06-08 DIAGNOSIS — G20 Parkinson's disease: Secondary | ICD-10-CM | POA: Diagnosis not present

## 2015-06-08 DIAGNOSIS — K7010 Alcoholic hepatitis without ascites: Secondary | ICD-10-CM | POA: Diagnosis not present

## 2015-06-08 DIAGNOSIS — L89152 Pressure ulcer of sacral region, stage 2: Secondary | ICD-10-CM | POA: Diagnosis not present

## 2015-06-08 DIAGNOSIS — I10 Essential (primary) hypertension: Secondary | ICD-10-CM | POA: Diagnosis not present

## 2015-06-08 DIAGNOSIS — D136 Benign neoplasm of pancreas: Secondary | ICD-10-CM | POA: Diagnosis not present

## 2015-06-11 DIAGNOSIS — I10 Essential (primary) hypertension: Secondary | ICD-10-CM | POA: Diagnosis not present

## 2015-06-11 DIAGNOSIS — K7010 Alcoholic hepatitis without ascites: Secondary | ICD-10-CM | POA: Diagnosis not present

## 2015-06-11 DIAGNOSIS — G20 Parkinson's disease: Secondary | ICD-10-CM | POA: Diagnosis not present

## 2015-06-11 DIAGNOSIS — L89152 Pressure ulcer of sacral region, stage 2: Secondary | ICD-10-CM | POA: Diagnosis not present

## 2015-06-11 DIAGNOSIS — D136 Benign neoplasm of pancreas: Secondary | ICD-10-CM | POA: Diagnosis not present

## 2015-06-11 DIAGNOSIS — E119 Type 2 diabetes mellitus without complications: Secondary | ICD-10-CM | POA: Diagnosis not present

## 2015-06-13 DIAGNOSIS — D136 Benign neoplasm of pancreas: Secondary | ICD-10-CM | POA: Diagnosis not present

## 2015-06-13 DIAGNOSIS — L89152 Pressure ulcer of sacral region, stage 2: Secondary | ICD-10-CM | POA: Diagnosis not present

## 2015-06-13 DIAGNOSIS — I10 Essential (primary) hypertension: Secondary | ICD-10-CM | POA: Diagnosis not present

## 2015-06-13 DIAGNOSIS — G20 Parkinson's disease: Secondary | ICD-10-CM | POA: Diagnosis not present

## 2015-06-13 DIAGNOSIS — E119 Type 2 diabetes mellitus without complications: Secondary | ICD-10-CM | POA: Diagnosis not present

## 2015-06-13 DIAGNOSIS — K7010 Alcoholic hepatitis without ascites: Secondary | ICD-10-CM | POA: Diagnosis not present

## 2015-06-15 DIAGNOSIS — D136 Benign neoplasm of pancreas: Secondary | ICD-10-CM | POA: Diagnosis not present

## 2015-06-15 DIAGNOSIS — L89152 Pressure ulcer of sacral region, stage 2: Secondary | ICD-10-CM | POA: Diagnosis not present

## 2015-06-15 DIAGNOSIS — G20 Parkinson's disease: Secondary | ICD-10-CM | POA: Diagnosis not present

## 2015-06-15 DIAGNOSIS — I10 Essential (primary) hypertension: Secondary | ICD-10-CM | POA: Diagnosis not present

## 2015-06-15 DIAGNOSIS — E119 Type 2 diabetes mellitus without complications: Secondary | ICD-10-CM | POA: Diagnosis not present

## 2015-06-15 DIAGNOSIS — K7010 Alcoholic hepatitis without ascites: Secondary | ICD-10-CM | POA: Diagnosis not present

## 2015-06-18 DIAGNOSIS — I10 Essential (primary) hypertension: Secondary | ICD-10-CM | POA: Diagnosis not present

## 2015-06-18 DIAGNOSIS — D136 Benign neoplasm of pancreas: Secondary | ICD-10-CM | POA: Diagnosis not present

## 2015-06-18 DIAGNOSIS — G20 Parkinson's disease: Secondary | ICD-10-CM | POA: Diagnosis not present

## 2015-06-18 DIAGNOSIS — K7010 Alcoholic hepatitis without ascites: Secondary | ICD-10-CM | POA: Diagnosis not present

## 2015-06-18 DIAGNOSIS — E119 Type 2 diabetes mellitus without complications: Secondary | ICD-10-CM | POA: Diagnosis not present

## 2015-06-18 DIAGNOSIS — L89152 Pressure ulcer of sacral region, stage 2: Secondary | ICD-10-CM | POA: Diagnosis not present

## 2015-06-20 DIAGNOSIS — I10 Essential (primary) hypertension: Secondary | ICD-10-CM | POA: Diagnosis not present

## 2015-06-20 DIAGNOSIS — L89152 Pressure ulcer of sacral region, stage 2: Secondary | ICD-10-CM | POA: Diagnosis not present

## 2015-06-20 DIAGNOSIS — G20 Parkinson's disease: Secondary | ICD-10-CM | POA: Diagnosis not present

## 2015-06-20 DIAGNOSIS — E119 Type 2 diabetes mellitus without complications: Secondary | ICD-10-CM | POA: Diagnosis not present

## 2015-06-20 DIAGNOSIS — D136 Benign neoplasm of pancreas: Secondary | ICD-10-CM | POA: Diagnosis not present

## 2015-06-20 DIAGNOSIS — K7010 Alcoholic hepatitis without ascites: Secondary | ICD-10-CM | POA: Diagnosis not present

## 2015-06-21 DIAGNOSIS — E119 Type 2 diabetes mellitus without complications: Secondary | ICD-10-CM | POA: Diagnosis not present

## 2015-06-21 DIAGNOSIS — D136 Benign neoplasm of pancreas: Secondary | ICD-10-CM | POA: Diagnosis not present

## 2015-06-21 DIAGNOSIS — L89152 Pressure ulcer of sacral region, stage 2: Secondary | ICD-10-CM | POA: Diagnosis not present

## 2015-06-21 DIAGNOSIS — I10 Essential (primary) hypertension: Secondary | ICD-10-CM | POA: Diagnosis not present

## 2015-06-21 DIAGNOSIS — G20 Parkinson's disease: Secondary | ICD-10-CM | POA: Diagnosis not present

## 2015-06-21 DIAGNOSIS — K7010 Alcoholic hepatitis without ascites: Secondary | ICD-10-CM | POA: Diagnosis not present

## 2015-06-22 DIAGNOSIS — L89152 Pressure ulcer of sacral region, stage 2: Secondary | ICD-10-CM | POA: Diagnosis not present

## 2015-06-22 DIAGNOSIS — K7010 Alcoholic hepatitis without ascites: Secondary | ICD-10-CM | POA: Diagnosis not present

## 2015-06-22 DIAGNOSIS — E119 Type 2 diabetes mellitus without complications: Secondary | ICD-10-CM | POA: Diagnosis not present

## 2015-06-22 DIAGNOSIS — I10 Essential (primary) hypertension: Secondary | ICD-10-CM | POA: Diagnosis not present

## 2015-06-22 DIAGNOSIS — G20 Parkinson's disease: Secondary | ICD-10-CM | POA: Diagnosis not present

## 2015-06-22 DIAGNOSIS — D136 Benign neoplasm of pancreas: Secondary | ICD-10-CM | POA: Diagnosis not present

## 2015-06-25 DIAGNOSIS — K7010 Alcoholic hepatitis without ascites: Secondary | ICD-10-CM | POA: Diagnosis not present

## 2015-06-25 DIAGNOSIS — L89152 Pressure ulcer of sacral region, stage 2: Secondary | ICD-10-CM | POA: Diagnosis not present

## 2015-06-25 DIAGNOSIS — E119 Type 2 diabetes mellitus without complications: Secondary | ICD-10-CM | POA: Diagnosis not present

## 2015-06-25 DIAGNOSIS — D136 Benign neoplasm of pancreas: Secondary | ICD-10-CM | POA: Diagnosis not present

## 2015-06-25 DIAGNOSIS — G20 Parkinson's disease: Secondary | ICD-10-CM | POA: Diagnosis not present

## 2015-06-25 DIAGNOSIS — I10 Essential (primary) hypertension: Secondary | ICD-10-CM | POA: Diagnosis not present

## 2015-06-27 DIAGNOSIS — D136 Benign neoplasm of pancreas: Secondary | ICD-10-CM | POA: Diagnosis not present

## 2015-06-27 DIAGNOSIS — L89152 Pressure ulcer of sacral region, stage 2: Secondary | ICD-10-CM | POA: Diagnosis not present

## 2015-06-27 DIAGNOSIS — I10 Essential (primary) hypertension: Secondary | ICD-10-CM | POA: Diagnosis not present

## 2015-06-27 DIAGNOSIS — E119 Type 2 diabetes mellitus without complications: Secondary | ICD-10-CM | POA: Diagnosis not present

## 2015-06-27 DIAGNOSIS — G20 Parkinson's disease: Secondary | ICD-10-CM | POA: Diagnosis not present

## 2015-06-27 DIAGNOSIS — K7010 Alcoholic hepatitis without ascites: Secondary | ICD-10-CM | POA: Diagnosis not present

## 2015-06-29 DIAGNOSIS — L89152 Pressure ulcer of sacral region, stage 2: Secondary | ICD-10-CM | POA: Diagnosis not present

## 2015-06-29 DIAGNOSIS — K7010 Alcoholic hepatitis without ascites: Secondary | ICD-10-CM | POA: Diagnosis not present

## 2015-06-29 DIAGNOSIS — I10 Essential (primary) hypertension: Secondary | ICD-10-CM | POA: Diagnosis not present

## 2015-06-29 DIAGNOSIS — E119 Type 2 diabetes mellitus without complications: Secondary | ICD-10-CM | POA: Diagnosis not present

## 2015-06-29 DIAGNOSIS — G20 Parkinson's disease: Secondary | ICD-10-CM | POA: Diagnosis not present

## 2015-06-29 DIAGNOSIS — D136 Benign neoplasm of pancreas: Secondary | ICD-10-CM | POA: Diagnosis not present

## 2015-07-02 DIAGNOSIS — D136 Benign neoplasm of pancreas: Secondary | ICD-10-CM | POA: Diagnosis not present

## 2015-07-02 DIAGNOSIS — L89152 Pressure ulcer of sacral region, stage 2: Secondary | ICD-10-CM | POA: Diagnosis not present

## 2015-07-02 DIAGNOSIS — E119 Type 2 diabetes mellitus without complications: Secondary | ICD-10-CM | POA: Diagnosis not present

## 2015-07-02 DIAGNOSIS — G20 Parkinson's disease: Secondary | ICD-10-CM | POA: Diagnosis not present

## 2015-07-02 DIAGNOSIS — I10 Essential (primary) hypertension: Secondary | ICD-10-CM | POA: Diagnosis not present

## 2015-07-02 DIAGNOSIS — K7010 Alcoholic hepatitis without ascites: Secondary | ICD-10-CM | POA: Diagnosis not present

## 2015-07-04 DIAGNOSIS — I10 Essential (primary) hypertension: Secondary | ICD-10-CM | POA: Diagnosis not present

## 2015-07-04 DIAGNOSIS — D136 Benign neoplasm of pancreas: Secondary | ICD-10-CM | POA: Diagnosis not present

## 2015-07-04 DIAGNOSIS — E119 Type 2 diabetes mellitus without complications: Secondary | ICD-10-CM | POA: Diagnosis not present

## 2015-07-04 DIAGNOSIS — G20 Parkinson's disease: Secondary | ICD-10-CM | POA: Diagnosis not present

## 2015-07-04 DIAGNOSIS — L89152 Pressure ulcer of sacral region, stage 2: Secondary | ICD-10-CM | POA: Diagnosis not present

## 2015-07-04 DIAGNOSIS — K7010 Alcoholic hepatitis without ascites: Secondary | ICD-10-CM | POA: Diagnosis not present

## 2015-07-06 DIAGNOSIS — I10 Essential (primary) hypertension: Secondary | ICD-10-CM | POA: Diagnosis not present

## 2015-07-06 DIAGNOSIS — D136 Benign neoplasm of pancreas: Secondary | ICD-10-CM | POA: Diagnosis not present

## 2015-07-06 DIAGNOSIS — L89152 Pressure ulcer of sacral region, stage 2: Secondary | ICD-10-CM | POA: Diagnosis not present

## 2015-07-06 DIAGNOSIS — K7010 Alcoholic hepatitis without ascites: Secondary | ICD-10-CM | POA: Diagnosis not present

## 2015-07-06 DIAGNOSIS — E119 Type 2 diabetes mellitus without complications: Secondary | ICD-10-CM | POA: Diagnosis not present

## 2015-07-06 DIAGNOSIS — G20 Parkinson's disease: Secondary | ICD-10-CM | POA: Diagnosis not present

## 2015-07-09 DIAGNOSIS — G20 Parkinson's disease: Secondary | ICD-10-CM | POA: Diagnosis not present

## 2015-07-09 DIAGNOSIS — D136 Benign neoplasm of pancreas: Secondary | ICD-10-CM | POA: Diagnosis not present

## 2015-07-09 DIAGNOSIS — E119 Type 2 diabetes mellitus without complications: Secondary | ICD-10-CM | POA: Diagnosis not present

## 2015-07-09 DIAGNOSIS — K7010 Alcoholic hepatitis without ascites: Secondary | ICD-10-CM | POA: Diagnosis not present

## 2015-07-09 DIAGNOSIS — I10 Essential (primary) hypertension: Secondary | ICD-10-CM | POA: Diagnosis not present

## 2015-07-09 DIAGNOSIS — L89152 Pressure ulcer of sacral region, stage 2: Secondary | ICD-10-CM | POA: Diagnosis not present

## 2015-07-11 DIAGNOSIS — E119 Type 2 diabetes mellitus without complications: Secondary | ICD-10-CM | POA: Diagnosis not present

## 2015-07-11 DIAGNOSIS — K7010 Alcoholic hepatitis without ascites: Secondary | ICD-10-CM | POA: Diagnosis not present

## 2015-07-11 DIAGNOSIS — I10 Essential (primary) hypertension: Secondary | ICD-10-CM | POA: Diagnosis not present

## 2015-07-11 DIAGNOSIS — D136 Benign neoplasm of pancreas: Secondary | ICD-10-CM | POA: Diagnosis not present

## 2015-07-11 DIAGNOSIS — G20 Parkinson's disease: Secondary | ICD-10-CM | POA: Diagnosis not present

## 2015-07-11 DIAGNOSIS — L89152 Pressure ulcer of sacral region, stage 2: Secondary | ICD-10-CM | POA: Diagnosis not present

## 2015-07-13 DIAGNOSIS — L89152 Pressure ulcer of sacral region, stage 2: Secondary | ICD-10-CM | POA: Diagnosis not present

## 2015-07-13 DIAGNOSIS — D136 Benign neoplasm of pancreas: Secondary | ICD-10-CM | POA: Diagnosis not present

## 2015-07-13 DIAGNOSIS — E119 Type 2 diabetes mellitus without complications: Secondary | ICD-10-CM | POA: Diagnosis not present

## 2015-07-13 DIAGNOSIS — G20 Parkinson's disease: Secondary | ICD-10-CM | POA: Diagnosis not present

## 2015-07-13 DIAGNOSIS — K7010 Alcoholic hepatitis without ascites: Secondary | ICD-10-CM | POA: Diagnosis not present

## 2015-07-13 DIAGNOSIS — I10 Essential (primary) hypertension: Secondary | ICD-10-CM | POA: Diagnosis not present

## 2015-07-17 DIAGNOSIS — L89152 Pressure ulcer of sacral region, stage 2: Secondary | ICD-10-CM | POA: Diagnosis not present

## 2015-07-17 DIAGNOSIS — K7010 Alcoholic hepatitis without ascites: Secondary | ICD-10-CM | POA: Diagnosis not present

## 2015-07-17 DIAGNOSIS — D136 Benign neoplasm of pancreas: Secondary | ICD-10-CM | POA: Diagnosis not present

## 2015-07-17 DIAGNOSIS — E119 Type 2 diabetes mellitus without complications: Secondary | ICD-10-CM | POA: Diagnosis not present

## 2015-07-17 DIAGNOSIS — I10 Essential (primary) hypertension: Secondary | ICD-10-CM | POA: Diagnosis not present

## 2015-07-17 DIAGNOSIS — G20 Parkinson's disease: Secondary | ICD-10-CM | POA: Diagnosis not present

## 2015-07-18 DIAGNOSIS — I10 Essential (primary) hypertension: Secondary | ICD-10-CM | POA: Diagnosis not present

## 2015-07-18 DIAGNOSIS — G20 Parkinson's disease: Secondary | ICD-10-CM | POA: Diagnosis not present

## 2015-07-18 DIAGNOSIS — E119 Type 2 diabetes mellitus without complications: Secondary | ICD-10-CM | POA: Diagnosis not present

## 2015-07-18 DIAGNOSIS — K7010 Alcoholic hepatitis without ascites: Secondary | ICD-10-CM | POA: Diagnosis not present

## 2015-07-18 DIAGNOSIS — D136 Benign neoplasm of pancreas: Secondary | ICD-10-CM | POA: Diagnosis not present

## 2015-07-18 DIAGNOSIS — L89152 Pressure ulcer of sacral region, stage 2: Secondary | ICD-10-CM | POA: Diagnosis not present

## 2015-07-23 DIAGNOSIS — K7010 Alcoholic hepatitis without ascites: Secondary | ICD-10-CM | POA: Diagnosis not present

## 2015-07-23 DIAGNOSIS — E119 Type 2 diabetes mellitus without complications: Secondary | ICD-10-CM | POA: Diagnosis not present

## 2015-07-23 DIAGNOSIS — G20 Parkinson's disease: Secondary | ICD-10-CM | POA: Diagnosis not present

## 2015-07-23 DIAGNOSIS — L89152 Pressure ulcer of sacral region, stage 2: Secondary | ICD-10-CM | POA: Diagnosis not present

## 2015-07-23 DIAGNOSIS — D136 Benign neoplasm of pancreas: Secondary | ICD-10-CM | POA: Diagnosis not present

## 2015-07-23 DIAGNOSIS — I10 Essential (primary) hypertension: Secondary | ICD-10-CM | POA: Diagnosis not present

## 2015-07-25 DIAGNOSIS — I10 Essential (primary) hypertension: Secondary | ICD-10-CM | POA: Diagnosis not present

## 2015-07-25 DIAGNOSIS — L89152 Pressure ulcer of sacral region, stage 2: Secondary | ICD-10-CM | POA: Diagnosis not present

## 2015-07-25 DIAGNOSIS — K7010 Alcoholic hepatitis without ascites: Secondary | ICD-10-CM | POA: Diagnosis not present

## 2015-07-25 DIAGNOSIS — E119 Type 2 diabetes mellitus without complications: Secondary | ICD-10-CM | POA: Diagnosis not present

## 2015-07-25 DIAGNOSIS — G20 Parkinson's disease: Secondary | ICD-10-CM | POA: Diagnosis not present

## 2015-07-25 DIAGNOSIS — D136 Benign neoplasm of pancreas: Secondary | ICD-10-CM | POA: Diagnosis not present

## 2015-07-26 DIAGNOSIS — D136 Benign neoplasm of pancreas: Secondary | ICD-10-CM | POA: Diagnosis not present

## 2015-07-26 DIAGNOSIS — E119 Type 2 diabetes mellitus without complications: Secondary | ICD-10-CM | POA: Diagnosis not present

## 2015-07-26 DIAGNOSIS — G20 Parkinson's disease: Secondary | ICD-10-CM | POA: Diagnosis not present

## 2015-07-26 DIAGNOSIS — K7010 Alcoholic hepatitis without ascites: Secondary | ICD-10-CM | POA: Diagnosis not present

## 2015-07-26 DIAGNOSIS — L89152 Pressure ulcer of sacral region, stage 2: Secondary | ICD-10-CM | POA: Diagnosis not present

## 2015-07-26 DIAGNOSIS — I10 Essential (primary) hypertension: Secondary | ICD-10-CM | POA: Diagnosis not present

## 2015-08-01 IMAGING — DX DG CHEST 1V
1 series · 1 of 1 positions shown · non-contrast
Comparison: 01/03/2014 and 12/10/2012

CLINICAL DATA: Nonproductive cough for 10 days. Diabetes.
Hypertension.

EXAM:
CHEST  1 VIEW

[chest ap]
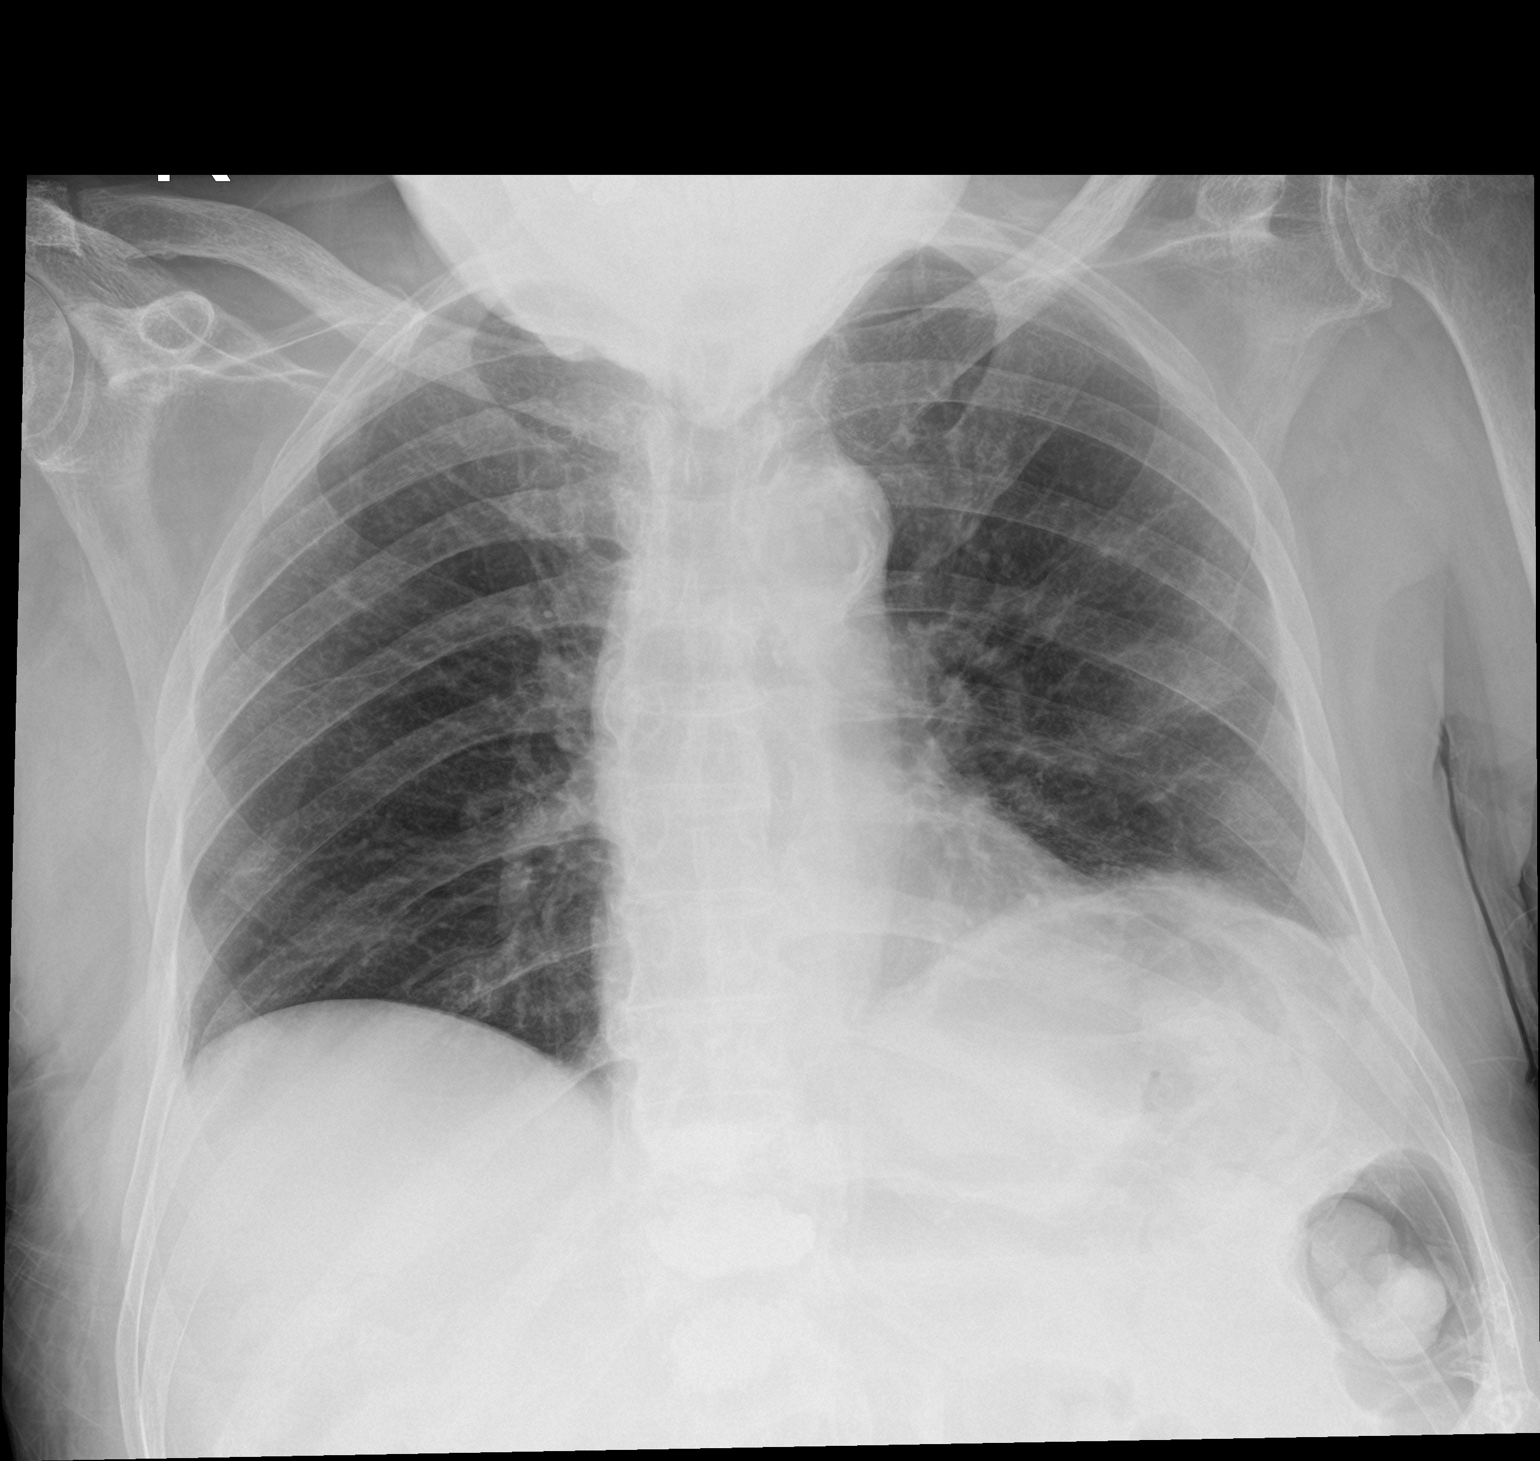

[1 of 1 positions shown; findings below may reference images not displayed]

FINDINGS: Low lung volumes again demonstrated with mild elevation of left
hemidiaphragm.

There is new asymmetric patchy opacity seen in the left midlung
field, suspicious for mild or early pneumonia.

The right lung is clear. No definite pleural effusion seen on this
portable exam. Heart size is within normal limits.
IMPRESSION: Mild asymmetric patchy opacity in left midlung field, suspicious for
mild or early pneumonia. Followup PA and lateral chest X-ray is
recommended in 3-4 weeks following trial of antibiotic therapy to
ensure resolution and exclude underlying malignancy.

## 2015-08-23 ENCOUNTER — Encounter (HOSPITAL_COMMUNITY): Payer: Self-pay | Admitting: Emergency Medicine

## 2015-08-23 ENCOUNTER — Inpatient Hospital Stay (HOSPITAL_COMMUNITY)
Admission: EM | Admit: 2015-08-23 | Discharge: 2015-08-26 | DRG: 445 | Disposition: A | Discharge status: Home / Self Care | Payer: Medicare Other | Attending: Internal Medicine | Admitting: Internal Medicine

## 2015-08-23 ENCOUNTER — Emergency Department (HOSPITAL_COMMUNITY): Payer: Medicare Other

## 2015-08-23 DIAGNOSIS — E871 Hypo-osmolality and hyponatremia: Secondary | ICD-10-CM | POA: Diagnosis present

## 2015-08-23 DIAGNOSIS — Z8052 Family history of malignant neoplasm of bladder: Secondary | ICD-10-CM

## 2015-08-23 DIAGNOSIS — R1011 Right upper quadrant pain: Secondary | ICD-10-CM | POA: Diagnosis present

## 2015-08-23 DIAGNOSIS — K819 Cholecystitis, unspecified: Secondary | ICD-10-CM

## 2015-08-23 DIAGNOSIS — Z7401 Bed confinement status: Secondary | ICD-10-CM

## 2015-08-23 DIAGNOSIS — Z87891 Personal history of nicotine dependence: Secondary | ICD-10-CM

## 2015-08-23 DIAGNOSIS — I10 Essential (primary) hypertension: Secondary | ICD-10-CM | POA: Diagnosis present

## 2015-08-23 DIAGNOSIS — K7010 Alcoholic hepatitis without ascites: Secondary | ICD-10-CM | POA: Diagnosis present

## 2015-08-23 DIAGNOSIS — K810 Acute cholecystitis: Principal | ICD-10-CM | POA: Diagnosis present

## 2015-08-23 DIAGNOSIS — K2970 Gastritis, unspecified, without bleeding: Secondary | ICD-10-CM | POA: Diagnosis not present

## 2015-08-23 DIAGNOSIS — Z8546 Personal history of malignant neoplasm of prostate: Secondary | ICD-10-CM

## 2015-08-23 DIAGNOSIS — F0390 Unspecified dementia without behavioral disturbance: Secondary | ICD-10-CM | POA: Diagnosis present

## 2015-08-23 DIAGNOSIS — F03A0 Unspecified dementia, mild, without behavioral disturbance, psychotic disturbance, mood disturbance, and anxiety: Secondary | ICD-10-CM | POA: Diagnosis present

## 2015-08-23 DIAGNOSIS — Z800 Family history of malignant neoplasm of digestive organs: Secondary | ICD-10-CM

## 2015-08-23 DIAGNOSIS — F1020 Alcohol dependence, uncomplicated: Secondary | ICD-10-CM | POA: Diagnosis present

## 2015-08-23 DIAGNOSIS — R110 Nausea: Secondary | ICD-10-CM | POA: Diagnosis not present

## 2015-08-23 DIAGNOSIS — Z8673 Personal history of transient ischemic attack (TIA), and cerebral infarction without residual deficits: Secondary | ICD-10-CM

## 2015-08-23 DIAGNOSIS — E119 Type 2 diabetes mellitus without complications: Secondary | ICD-10-CM | POA: Diagnosis present

## 2015-08-23 LAB — URINALYSIS, ROUTINE W REFLEX MICROSCOPIC
Bilirubin Urine: NEGATIVE
Glucose, UA: NEGATIVE mg/dL
HGB URINE DIPSTICK: NEGATIVE
Ketones, ur: 40 mg/dL — AB
LEUKOCYTES UA: NEGATIVE
NITRITE: NEGATIVE
PROTEIN: NEGATIVE mg/dL
Specific Gravity, Urine: 1.015 (ref 1.005–1.030)
pH: 6.5 (ref 5.0–8.0)

## 2015-08-23 LAB — CBC
HCT: 41 % (ref 39.0–52.0)
Hemoglobin: 13.2 g/dL (ref 13.0–17.0)
MCH: 32.3 pg (ref 26.0–34.0)
MCHC: 32.2 g/dL (ref 30.0–36.0)
MCV: 100.2 fL — ABNORMAL HIGH (ref 78.0–100.0)
PLATELETS: 322 10*3/uL (ref 150–400)
RBC: 4.09 MIL/uL — AB (ref 4.22–5.81)
RDW: 13 % (ref 11.5–15.5)
WBC: 19.7 10*3/uL — AB (ref 4.0–10.5)

## 2015-08-23 LAB — COMPREHENSIVE METABOLIC PANEL
ALBUMIN: 3.6 g/dL (ref 3.5–5.0)
ALK PHOS: 85 U/L (ref 38–126)
ALT: 19 U/L (ref 17–63)
ANION GAP: 7 (ref 5–15)
AST: 20 U/L (ref 15–41)
BUN: 11 mg/dL (ref 6–20)
CALCIUM: 9.1 mg/dL (ref 8.9–10.3)
CO2: 25 mmol/L (ref 22–32)
CREATININE: 0.54 mg/dL — AB (ref 0.61–1.24)
Chloride: 97 mmol/L — ABNORMAL LOW (ref 101–111)
Glucose, Bld: 174 mg/dL — ABNORMAL HIGH (ref 65–99)
Potassium: 4.2 mmol/L (ref 3.5–5.1)
SODIUM: 129 mmol/L — AB (ref 135–145)
TOTAL PROTEIN: 7.1 g/dL (ref 6.5–8.1)
Total Bilirubin: 0.7 mg/dL (ref 0.3–1.2)

## 2015-08-23 LAB — LIPASE, BLOOD: LIPASE: 30 U/L (ref 11–51)

## 2015-08-23 MED ORDER — SODIUM CHLORIDE 0.9 % IV BOLUS (SEPSIS)
1000.0000 mL | Freq: Once | INTRAVENOUS | Status: AC
  Administered 2015-08-23: 1000 mL via INTRAVENOUS

## 2015-08-23 MED ORDER — MORPHINE SULFATE (PF) 4 MG/ML IV SOLN
4.0000 mg | Freq: Once | INTRAVENOUS | Status: AC
  Administered 2015-08-23: 4 mg via INTRAVENOUS
  Filled 2015-08-23: qty 1

## 2015-08-23 MED ORDER — ONDANSETRON HCL 4 MG/2ML IJ SOLN
4.0000 mg | Freq: Once | INTRAMUSCULAR | Status: AC
  Administered 2015-08-23: 4 mg via INTRAVENOUS
  Filled 2015-08-23: qty 2

## 2015-08-23 NOTE — ED Notes (Signed)
Pt reporting right sided abd pain with N/, denies V/D/ and fever. EMS gave 4 Zofran in route.

## 2015-08-23 NOTE — ED Provider Notes (Signed)
CSN: [id number]     Arrival date & time 08/23/15  2058 History   By signing my name below, I, Meriel Pica, attest that this documentation has been prepared under the direction and in the presence of Rolland Porter, MD at 2325. Electronically Signed: Meriel Pica, ED Scribe. 08/23/2015. 12:08 AM.   Chief Complaint  Patient presents with  . Abdominal Pain   The history is provided by the patient. No language interpreter was used.    HPI Comments: Stephen Bates is a 82 y.o. male, with a h/o HTN, prostate cancer, DM, and dementia, brought in by ambulance, who presents to the Emergency Department complaining of sudden onset, waxing and waning, RUQ abdominal pain that radiates to his back onset about 5 pm tonight. Per wife, the pt c/o an exacerbation of RUQ pain with deep inspiration. He has not tried any alleviating medications. The pt reports associated nausea but denies vomiting, diarrhea, constipation, fevers, flank pain, and dysuria. No h/o similar pain. PShx of ERCP performed by Dr. Laural Golden 3 months ago s/p elevated LFTs and painfuless jaundice when he was admitted to the hospital by Dr. Willey Blade. The pt denies similar abdominal pain at time of ERCP procedure, however cholelithiasis was viewed on MRI that was obtained 3 months ago. He also was felt to have intraductal papillary mucinous neoplasm of his pancreas. Patient can only describe the pain is "hurts". He states he's never had this pain before.   PCP:  Dr. Willey Blade   Past Medical History  Diagnosis Date  . Hypertension   . Prostate cancer ([place])   . Weakness of both legs   . Stroke ([place]) 1982  . Wears glasses   . Arthritis   . Benign neoplasm of colon   . History of right bundle branch block   . Diabetes ([place])   . Mild dementia   . Neuropathy ([place])   . Frequent falls 2012  . Chronic back pain   . Lumbar radiculopathy    Past Surgical History  Procedure Laterality Date  . Eye surgery  2010    both cataracts  . Colonoscopy    .  Insertion prostate radiation seed  2001  . Mohs surgery  12/12/2008  . Kyphoplasty    . Ercp N/A 05/24/2015    Procedure: ENDOSCOPIC RETROGRADE CHOLANGIOPANCREATOGRAPHY;  Surgeon: Rogene Houston, MD;  Location: [facility];  Service: Endoscopy;  Laterality: N/A;  . Sphincterotomy N/A 05/24/2015    Procedure: BILIARY SPHINCTEROTOMY;  Surgeon: Rogene Houston, MD;  Location: [facility];  Service: Endoscopy;  Laterality: N/A;   Family History  Problem Relation Age of Onset  . Colon cancer Sister   . Bladder Cancer Father   . Bronchopulmonary dysplasia Mother    Social History  Substance Use Topics  . Smoking status: Former Smoker    Quit date: 06/08/1969  . Smokeless tobacco: Never Used  . Alcohol Use: No     Comment: daily x2 now   lives at home Lives with spouse  Review of Systems  Constitutional: Negative for fever.  Gastrointestinal: Positive for nausea and abdominal pain. Negative for vomiting, diarrhea and constipation.  Genitourinary: Negative for dysuria, flank pain and decreased urine volume.  All other systems reviewed and are negative.  Allergies  Review of patient's allergies indicates no known allergies.  Home Medications   Prior to Admission medications   Medication Sig Start Date End Date Taking? Authorizing Provider  aspirin EC 81 MG tablet Take 81 mg by mouth daily.  Yes Historical Provider, MD  benazepril (LOTENSIN) 40 MG tablet Take 1 tablet by mouth every morning.  10/04/13  Yes Historical Provider, MD  metFORMIN (GLUCOPHAGE-XR) 500 MG 24 hr tablet Take 500 mg by mouth daily. 08/22/15  Yes Historical Provider, MD  mirtazapine (REMERON) 30 MG tablet Take 30 mg by mouth at bedtime.   Yes Historical Provider, MD  Multiple Vitamins-Minerals (CENTRUM SILVER ADULT 50+ PO) Take 1 tablet by mouth every morning.   Yes Historical Provider, MD  AMBULATORY NON FORMULARY MEDICATION 1 Device by Does not apply route daily. Lift Chair 05/11/14   Eustace Quail Tat, DO  docusate sodium  (COLACE) 100 MG capsule Take 100 mg by mouth daily as needed for mild constipation.     Historical Provider, MD  polyethylene glycol (MIRALAX / GLYCOLAX) packet Take 17 g by mouth daily as needed for mild constipation.     Historical Provider, MD   BP 106/64 mmHg  Pulse 120  Temp(Src) 98.4 F (36.9 C) (Oral)  Resp 18  SpO2 94%  Vital signs normal except for tachycardia  Physical Exam  Constitutional: He is oriented to person, place, and time. He appears well-developed and well-nourished.  Non-toxic appearance. He does not appear ill. No distress.  HENT:  Head: Normocephalic and atraumatic.  Right Ear: External ear normal.  Left Ear: External ear normal.  Nose: Nose normal. No mucosal edema or rhinorrhea.  Mouth/Throat: Oropharynx is clear and moist and mucous membranes are normal. No dental abscesses or uvula swelling.  Eyes: Conjunctivae and EOM are normal. Pupils are equal, round, and reactive to light.  Neck: Normal range of motion and full passive range of motion without pain. Neck supple.  Cardiovascular: Normal rate, regular rhythm and normal heart sounds.  Exam reveals no gallop and no friction rub.   No murmur heard. Pulmonary/Chest: Effort normal and breath sounds normal. No respiratory distress. He has no wheezes. He has no rhonchi. He has no rales. He exhibits no tenderness and no crepitus.  Abdominal: Soft. Normal appearance and bowel sounds are normal. He exhibits no distension. There is tenderness. There is no rebound and no guarding.  Tender in RUQ, mild tenderness in RLQ.   Musculoskeletal: Normal range of motion. He exhibits no edema or tenderness.  Moves all extremities well.   Neurological: He is alert and oriented to person, place, and time. He has normal strength. No cranial nerve deficit.  Skin: Skin is warm, dry and intact. No rash noted. No erythema. No pallor.  He has multiple skin changes on face and scalp consistent with skin cancer.   Psychiatric: He has a  normal mood and affect. His speech is normal and behavior is normal. His mood appears not anxious.  Nursing note and vitals reviewed.   ED Course  Procedures   Medications  sodium chloride 0.9 % bolus 1,000 mL (0 mLs Intravenous Stopped 08/24/15 0141)  ondansetron (ZOFRAN) injection 4 mg (4 mg Intravenous Given 08/23/15 2356)  morphine 4 MG/ML injection 4 mg (4 mg Intravenous Given 08/23/15 2356)  iohexol (OMNIPAQUE) 300 MG/ML solution 100 mL (100 mLs Intravenous Contrast Given 08/24/15 0022)  Ampicillin-Sulbactam (UNASYN) 3 g in sodium chloride 0.9 % 100 mL IVPB (0 g Intravenous Stopped 08/24/15 0254)    DIAGNOSTIC STUDIES: Oxygen Saturation is 94% on RA, adequate by my interpretation.    COORDINATION OF CARE: 11:33 PM Discussed treatment plan with pt at bedside which includes to order CT abdomen pelvis. Discussed lab results thus far. Pt agreed to  plan. Patient was given IV fluids because he appeared to be dehydrated. He was given IV Zofran and morphine for his pain and nausea. Ultrasound of the gallbladder is not available at night.  Review of patient's testing in November showed he did have gallstones on his MRI done of his abdomen although his ultrasound just showed gallbladder sludge.  After reviewing patient's CT scan he was given Unasyn 3 g IV. I talked to the patient and his family about admission for cholecystitis and they are agreeable.  01:58 Dr Shanon Brow, hospitalist, wants lactic acid to be done, will see patient for admission  Labs Review Results for orders placed or performed during the hospital encounter of 08/23/15  Lipase, blood  Result Value Ref Range   Lipase 30 11 - 51 U/L  Comprehensive metabolic panel  Result Value Ref Range   Sodium 129 (L) 135 - 145 mmol/L   Potassium 4.2 3.5 - 5.1 mmol/L   Chloride 97 (L) 101 - 111 mmol/L   CO2 25 22 - 32 mmol/L   Glucose, Bld 174 (H) 65 - 99 mg/dL   BUN 11 6 - 20 mg/dL   Creatinine, Ser 0.54 (L) 0.61 - 1.24 mg/dL   Calcium  9.1 8.9 - 10.3 mg/dL   Total Protein 7.1 6.5 - 8.1 g/dL   Albumin 3.6 3.5 - 5.0 g/dL   AST 20 15 - 41 U/L   ALT 19 17 - 63 U/L   Alkaline Phosphatase 85 38 - 126 U/L   Total Bilirubin 0.7 0.3 - 1.2 mg/dL   GFR calc non Af Amer >60 >60 mL/min   GFR calc Af Amer >60 >60 mL/min   Anion gap 7 5 - 15  CBC  Result Value Ref Range   WBC 19.7 (H) 4.0 - 10.5 K/uL   RBC 4.09 (L) 4.22 - 5.81 MIL/uL   Hemoglobin 13.2 13.0 - 17.0 g/dL   HCT 41.0 39.0 - 52.0 %   MCV 100.2 (H) 78.0 - 100.0 fL   MCH 32.3 26.0 - 34.0 pg   MCHC 32.2 30.0 - 36.0 g/dL   RDW 13.0 11.5 - 15.5 %   Platelets 322 150 - 400 K/uL  Urinalysis, Routine w reflex microscopic (not at [facility] [place])  Result Value Ref Range   Color, Urine YELLOW YELLOW   APPearance CLEAR CLEAR   Specific Gravity, Urine 1.015 1.005 - 1.030   pH 6.5 5.0 - 8.0   Glucose, UA NEGATIVE NEGATIVE mg/dL   Hgb urine dipstick NEGATIVE NEGATIVE   Bilirubin Urine NEGATIVE NEGATIVE   Ketones, ur 40 (A) NEGATIVE mg/dL   Protein, ur NEGATIVE NEGATIVE mg/dL   Nitrite NEGATIVE NEGATIVE   Leukocytes, UA NEGATIVE NEGATIVE  Lactic acid, plasma  Result Value Ref Range   Lactic Acid, Venous 1.3 0.5 - 2.0 mmol/L   Laboratory interpretation all normal except more elevation of his chronically elevated white blood cell count, persistent elevation of his LFTs although improved, persistent hyponatremia and low chloride     Imaging Review Ct Abdomen Pelvis W Contrast  08/24/2015  CLINICAL DATA:  Sudden onset waxing and waning right upper quadrant abdominal pain radiating to the right lower quadrant for 6 hours. Nausea. History of hypertension, prostate cancer, diabetes, dementia. EXAM: CT ABDOMEN AND PELVIS WITH CONTRAST TECHNIQUE: Multidetector CT imaging of the abdomen and pelvis was performed using the standard protocol following bolus administration of intravenous contrast. CONTRAST:  148mL OMNIPAQUE IOHEXOL 300 MG/ML  SOLN COMPARISON:  05/22/2015 FINDINGS: Atelectasis in  the lung  bases. Minimal bilateral pleural effusions. Coronary artery calcifications. There is mild diffuse gallbladder wall thickening with some stranding around the gallbladder fat. No stones or bile duct dilatation. Changes are nonspecific but could be associated with cholecystitis in the appropriate clinical setting. 11 mm diameter low-attenuation structure, likely cystic in the head of the pancreas is unchanged since previous study. No pancreatic ductal dilatation. No pancreatic infiltration. The spleen, adrenal glands, kidneys, inferior vena cava, and retroperitoneal lymph nodes are unremarkable. Diffuse calcification of the abdominal aorta without aneurysm. The stomach, small bowel, and colon are not abnormally distended. Diffusely stool-filled colon. No free air or free fluid in the abdomen. Abdominal wall musculature appears intact. Pelvis: Appendix is normal. Mild diffuse wall thickening in the bladder may indicate cystitis. Seed implants in the prostate gland. No pelvic mass or lymphadenopathy. The large amount of stool in the rectosigmoid colon possibly due to constipation. No evidence of diverticulitis. Diffuse bone demineralization. Degenerative changes throughout the lumbar spine. Multiple vertebral compression deformities with post kyphoplasty changes. Prominent degenerative changes in the hips. No focal bone lesions that would suggest metastatic disease. IMPRESSION: Gallbladder wall thickening with pericholecystic stranding suggesting possible cholecystitis. No gallstones or bile duct dilatation identified. Unchanged appearance of 1.1 cm cystic lesion in the head of the pancreas. Appearance is nonspecific. Diffuse bladder wall thickening may indicate cystitis. Diffusely stool-filled colon may indicate constipation. Electronically Signed   By: Lucienne Capers M.D.   On: 08/24/2015 00:50   I have personally reviewed and evaluated these images and lab results as part of my medical  decision-making.   EKG Interpretation None      MDM   Final diagnoses:  Cholecystitis   Plan admission   I personally performed the services described in this documentation, which was scribed in my presence. The recorded information has been reviewed and considered.  Rolland Porter, MD, Barbette Or, MD 08/24/15 [id number]

## 2015-08-24 DIAGNOSIS — K7010 Alcoholic hepatitis without ascites: Secondary | ICD-10-CM | POA: Diagnosis present

## 2015-08-24 DIAGNOSIS — K819 Cholecystitis, unspecified: Secondary | ICD-10-CM | POA: Diagnosis not present

## 2015-08-24 DIAGNOSIS — K810 Acute cholecystitis: Principal | ICD-10-CM

## 2015-08-24 DIAGNOSIS — Z7401 Bed confinement status: Secondary | ICD-10-CM | POA: Diagnosis not present

## 2015-08-24 DIAGNOSIS — R1011 Right upper quadrant pain: Secondary | ICD-10-CM | POA: Diagnosis not present

## 2015-08-24 DIAGNOSIS — R279 Unspecified lack of coordination: Secondary | ICD-10-CM | POA: Diagnosis not present

## 2015-08-24 DIAGNOSIS — F0390 Unspecified dementia without behavioral disturbance: Secondary | ICD-10-CM

## 2015-08-24 DIAGNOSIS — F1020 Alcohol dependence, uncomplicated: Secondary | ICD-10-CM | POA: Diagnosis present

## 2015-08-24 DIAGNOSIS — Z8673 Personal history of transient ischemic attack (TIA), and cerebral infarction without residual deficits: Secondary | ICD-10-CM | POA: Diagnosis not present

## 2015-08-24 DIAGNOSIS — Z87891 Personal history of nicotine dependence: Secondary | ICD-10-CM | POA: Diagnosis not present

## 2015-08-24 DIAGNOSIS — Z800 Family history of malignant neoplasm of digestive organs: Secondary | ICD-10-CM | POA: Diagnosis not present

## 2015-08-24 DIAGNOSIS — E119 Type 2 diabetes mellitus without complications: Secondary | ICD-10-CM | POA: Diagnosis present

## 2015-08-24 DIAGNOSIS — Z8052 Family history of malignant neoplasm of bladder: Secondary | ICD-10-CM | POA: Diagnosis not present

## 2015-08-24 DIAGNOSIS — I10 Essential (primary) hypertension: Secondary | ICD-10-CM | POA: Diagnosis present

## 2015-08-24 DIAGNOSIS — E871 Hypo-osmolality and hyponatremia: Secondary | ICD-10-CM | POA: Diagnosis present

## 2015-08-24 DIAGNOSIS — Z743 Need for continuous supervision: Secondary | ICD-10-CM | POA: Diagnosis not present

## 2015-08-24 DIAGNOSIS — K8020 Calculus of gallbladder without cholecystitis without obstruction: Secondary | ICD-10-CM | POA: Diagnosis not present

## 2015-08-24 DIAGNOSIS — Z8546 Personal history of malignant neoplasm of prostate: Secondary | ICD-10-CM | POA: Diagnosis not present

## 2015-08-24 LAB — CBC
HEMATOCRIT: 37.4 % — AB (ref 39.0–52.0)
HEMOGLOBIN: 12.2 g/dL — AB (ref 13.0–17.0)
MCH: 32.7 pg (ref 26.0–34.0)
MCHC: 32.6 g/dL (ref 30.0–36.0)
MCV: 100.3 fL — AB (ref 78.0–100.0)
Platelets: 324 10*3/uL (ref 150–400)
RBC: 3.73 MIL/uL — AB (ref 4.22–5.81)
RDW: 12.6 % (ref 11.5–15.5)
WBC: 12.1 10*3/uL — AB (ref 4.0–10.5)

## 2015-08-24 LAB — GLUCOSE, CAPILLARY
GLUCOSE-CAPILLARY: 105 mg/dL — AB (ref 65–99)
GLUCOSE-CAPILLARY: 104 mg/dL — AB (ref 65–99)
Glucose-Capillary: 279 mg/dL — ABNORMAL HIGH (ref 65–99)

## 2015-08-24 LAB — BASIC METABOLIC PANEL
Anion gap: 6 (ref 5–15)
BUN: 9 mg/dL (ref 6–20)
CHLORIDE: 101 mmol/L (ref 101–111)
CO2: 27 mmol/L (ref 22–32)
CREATININE: 0.41 mg/dL — AB (ref 0.61–1.24)
Calcium: 8.7 mg/dL — ABNORMAL LOW (ref 8.9–10.3)
GFR calc non Af Amer: >60 mL/min (ref >60)
Glucose, Bld: 128 mg/dL — ABNORMAL HIGH (ref 65–99)
POTASSIUM: 4.2 mmol/L (ref 3.5–5.1)
Sodium: 134 mmol/L — ABNORMAL LOW (ref 135–145)

## 2015-08-24 LAB — PROTIME-INR
INR: 1.02 (ref 0.00–1.49)
Prothrombin Time: 13.6 seconds (ref 11.6–15.2)

## 2015-08-24 LAB — LACTIC ACID, PLASMA
LACTIC ACID, VENOUS: 1.3 mmol/L (ref 0.5–2.0)
Lactic Acid, Venous: 1 mmol/L (ref 0.5–2.0)

## 2015-08-24 LAB — APTT: APTT: 33 s (ref 24–37)

## 2015-08-24 MED ORDER — SODIUM CHLORIDE 0.9 % IV SOLN
INTRAVENOUS | Status: DC
  Administered 2015-08-24 – 2015-08-25 (×2): via INTRAVENOUS

## 2015-08-24 MED ORDER — HYDROMORPHONE HCL 1 MG/ML IJ SOLN: 0.5000 mg | INTRAMUSCULAR | Status: DC | PRN

## 2015-08-24 MED ORDER — INSULIN ASPART 100 UNIT/ML ~~LOC~~ SOLN: 0.0000 [IU] | Freq: Every day | SUBCUTANEOUS | Status: DC

## 2015-08-24 MED ORDER — LACTATED RINGERS IV SOLN
INTRAVENOUS | Status: DC
  Administered 2015-08-24: 05:00:00 via INTRAVENOUS

## 2015-08-24 MED ORDER — SODIUM CHLORIDE 0.9% FLUSH
3.0000 mL | Freq: Two times a day (BID) | INTRAVENOUS | Status: DC
  Administered 2015-08-24 – 2015-08-26 (×4): 3 mL via INTRAVENOUS

## 2015-08-24 MED ORDER — AMPICILLIN-SULBACTAM SODIUM 3 (2-1) G IJ SOLR
3.0000 g | Freq: Three times a day (TID) | INTRAMUSCULAR | Status: DC
  Administered 2015-08-24 – 2015-08-26 (×7): 3 g via INTRAVENOUS
  Filled 2015-08-24 (×10): qty 3

## 2015-08-24 MED ORDER — ONDANSETRON HCL 4 MG/2ML IJ SOLN: 4.0000 mg | Freq: Four times a day (QID) | INTRAMUSCULAR | Status: DC | PRN

## 2015-08-24 MED ORDER — IOHEXOL 300 MG/ML  SOLN
100.0000 mL | Freq: Once | INTRAMUSCULAR | Status: AC | PRN
  Administered 2015-08-24: 100 mL via INTRAVENOUS

## 2015-08-24 MED ORDER — SODIUM CHLORIDE 0.9 % IV SOLN
3.0000 g | Freq: Once | INTRAVENOUS | Status: AC
  Administered 2015-08-24: 3 g via INTRAVENOUS
  Filled 2015-08-24: qty 3

## 2015-08-24 MED ORDER — ONDANSETRON HCL 4 MG PO TABS: 4.0000 mg | ORAL_TABLET | Freq: Four times a day (QID) | ORAL | Status: DC | PRN

## 2015-08-24 MED ORDER — INSULIN ASPART 100 UNIT/ML ~~LOC~~ SOLN
0.0000 [IU] | Freq: Three times a day (TID) | SUBCUTANEOUS | Status: DC
  Administered 2015-08-24: 5 [IU] via SUBCUTANEOUS
  Administered 2015-08-25: 3 [IU] via SUBCUTANEOUS
  Administered 2015-08-25: 5 [IU] via SUBCUTANEOUS
  Administered 2015-08-26: 3 [IU] via SUBCUTANEOUS
  Administered 2015-08-26: 1 [IU] via SUBCUTANEOUS

## 2015-08-24 MED ORDER — BOOST / RESOURCE BREEZE PO LIQD
1.0000 | Freq: Two times a day (BID) | ORAL | Status: DC
  Administered 2015-08-24 – 2015-08-26 (×4): 1 via ORAL

## 2015-08-24 NOTE — Consult Note (Signed)
Reason for Consult: Cholelithiasis Referring Physician: Dr. Asencion Noble  Stephen Bates is an 82 y.o. male.  HPI: Patient is an 82 year old white male with multiple medical problems, status post ERCP with sphincterotomy by Dr. Laural Golden in November 2016 who was found to have cholelithiasis and a cystic lesion at the head of the pancreas. The patient was brought in by caregivers due to upper abdominal pain and nausea. CT scan the abdomen revealed cholelithiasis with evidence of early cholecystitis. His white blood cell count was elevated. Liver enzyme tests were within normal limits. History is limited due to patient's dementia. Currently, he states that he has minimal abdominal pain and is hungry.  Past Medical History  Diagnosis Date  . Hypertension   . Prostate cancer ([place])   . Weakness of both legs   . Stroke ([place]) 1982  . Wears glasses   . Arthritis   . Benign neoplasm of colon   . History of right bundle branch block   . Diabetes ([place])   . Mild dementia   . Neuropathy ([place])   . Frequent falls 2012  . Chronic back pain   . Lumbar radiculopathy     Past Surgical History  Procedure Laterality Date  . Eye surgery  2010    both cataracts  . Colonoscopy    . Insertion prostate radiation seed  2001  . Mohs surgery  12/12/2008  . Kyphoplasty    . Ercp N/A 05/24/2015    Procedure: ENDOSCOPIC RETROGRADE CHOLANGIOPANCREATOGRAPHY;  Surgeon: Rogene [place], MD;  Location: [facility];  Service: Endoscopy;  Laterality: N/A;  . Sphincterotomy N/A 05/24/2015    Procedure: BILIARY SPHINCTEROTOMY;  Surgeon: Rogene [place], MD;  Location: [facility];  Service: Endoscopy;  Laterality: N/A;    Family History  Problem Relation Age of Onset  . Colon cancer Sister   . Bladder Cancer Father   . Bronchopulmonary dysplasia Mother     Social History:  reports that he quit smoking about 46 years ago. He has never used smokeless tobacco. He reports that he does not drink alcohol or use illicit  drugs.  Allergies: No Known Allergies  Medications: I have reviewed the patient's current medications.  Results for orders placed or performed during the hospital encounter of 08/23/15 (from the past 48 hour(s))  Lipase, blood     Status: None   Collection Time: 08/23/15  9:55 PM  Result Value Ref Range   Lipase 30 11 - 51 U/L  Comprehensive metabolic panel     Status: Abnormal   Collection Time: 08/23/15  9:55 PM  Result Value Ref Range   Sodium 129 (L) 135 - 145 mmol/L   Potassium 4.2 3.5 - 5.1 mmol/L   Chloride 97 (L) 101 - 111 mmol/L   CO2 25 22 - 32 mmol/L   Glucose, Bld 174 (H) 65 - 99 mg/dL   BUN 11 6 - 20 mg/dL   Creatinine, Ser 0.54 (L) 0.61 - 1.24 mg/dL   Calcium 9.1 8.9 - 10.3 mg/dL   Total Protein 7.1 6.5 - 8.1 g/dL   Albumin 3.6 3.5 - 5.0 g/dL   AST 20 15 - 41 U/L   ALT 19 17 - 63 U/L   Alkaline Phosphatase 85 38 - 126 U/L   Total Bilirubin 0.7 0.3 - 1.2 mg/dL   GFR calc non Af Amer >60 >60 mL/min   GFR calc Af Amer >60 >60 mL/min    Comment: (NOTE) The eGFR has been calculated using the  CKD EPI equation. This calculation has not been validated in all clinical situations. eGFR's persistently <60 mL/min signify possible Chronic Kidney Disease.    Anion gap 7 5 - 15  CBC     Status: Abnormal   Collection Time: 08/23/15  9:55 PM  Result Value Ref Range   WBC 19.7 (H) 4.0 - 10.5 K/uL   RBC 4.09 (L) 4.22 - 5.81 MIL/uL   Hemoglobin 13.2 13.0 - 17.0 g/dL   HCT 41.0 39.0 - 52.0 %   MCV 100.2 (H) 78.0 - 100.0 fL   MCH 32.3 26.0 - 34.0 pg   MCHC 32.2 30.0 - 36.0 g/dL   RDW 13.0 11.5 - 15.5 %   Platelets 322 150 - 400 K/uL  Lactic acid, plasma     Status: None   Collection Time: 08/23/15 10:00 PM  Result Value Ref Range   Lactic Acid, Venous 1.3 0.5 - 2.0 mmol/L  Urinalysis, Routine w reflex microscopic (not at [facility])     Status: Abnormal   Collection Time: 08/23/15 10:16 PM  Result Value Ref Range   Color, Urine YELLOW YELLOW   APPearance CLEAR CLEAR    Specific Gravity, Urine 1.015 1.005 - 1.030   pH 6.5 5.0 - 8.0   Glucose, UA NEGATIVE NEGATIVE mg/dL   Hgb urine dipstick NEGATIVE NEGATIVE   Bilirubin Urine NEGATIVE NEGATIVE   Ketones, ur 40 (A) NEGATIVE mg/dL   Protein, ur NEGATIVE NEGATIVE mg/dL   Nitrite NEGATIVE NEGATIVE   Leukocytes, UA NEGATIVE NEGATIVE    Comment: MICROSCOPIC NOT DONE ON URINES WITH NEGATIVE PROTEIN, BLOOD, LEUKOCYTES, NITRITE, OR GLUCOSE <1000 mg/dL.  Lactic acid, plasma     Status: None   Collection Time: 08/24/15  6:51 AM  Result Value Ref Range   Lactic Acid, Venous 1.0 0.5 - 2.0 mmol/L  Basic metabolic panel     Status: Abnormal   Collection Time: 08/24/15  6:51 AM  Result Value Ref Range   Sodium 134 (L) 135 - 145 mmol/L   Potassium 4.2 3.5 - 5.1 mmol/L   Chloride 101 101 - 111 mmol/L   CO2 27 22 - 32 mmol/L   Glucose, Bld 128 (H) 65 - 99 mg/dL   BUN 9 6 - 20 mg/dL   Creatinine, Ser 0.41 (L) 0.61 - 1.24 mg/dL   Calcium 8.7 (L) 8.9 - 10.3 mg/dL   GFR calc non Af Amer >60 >60 mL/min   GFR calc Af Amer >60 >60 mL/min    Comment: (NOTE) The eGFR has been calculated using the CKD EPI equation. This calculation has not been validated in all clinical situations. eGFR's persistently <60 mL/min signify possible Chronic Kidney Disease.    Anion gap 6 5 - 15  CBC     Status: Abnormal   Collection Time: 08/24/15  6:51 AM  Result Value Ref Range   WBC 12.1 (H) 4.0 - 10.5 K/uL   RBC 3.73 (L) 4.22 - 5.81 MIL/uL   Hemoglobin 12.2 (L) 13.0 - 17.0 g/dL   HCT 37.4 (L) 39.0 - 52.0 %   MCV 100.3 (H) 78.0 - 100.0 fL   MCH 32.7 26.0 - 34.0 pg   MCHC 32.6 30.0 - 36.0 g/dL   RDW 12.6 11.5 - 15.5 %   Platelets 324 150 - 400 K/uL  Protime-INR     Status: None   Collection Time: 08/24/15  6:51 AM  Result Value Ref Range   Prothrombin Time 13.6 11.6 - 15.2 seconds   INR 1.02 0.00 -  1.49  APTT     Status: None   Collection Time: 08/24/15  6:51 AM  Result Value Ref Range   aPTT 33 24 - 37 seconds    Ct  Abdomen Pelvis W Contrast  08/24/2015  CLINICAL DATA:  Sudden onset waxing and waning right upper quadrant abdominal pain radiating to the right lower quadrant for 6 hours. Nausea. History of hypertension, prostate cancer, diabetes, dementia. EXAM: CT ABDOMEN AND PELVIS WITH CONTRAST TECHNIQUE: Multidetector CT imaging of the abdomen and pelvis was performed using the standard protocol following bolus administration of intravenous contrast. CONTRAST:  144m OMNIPAQUE IOHEXOL 300 MG/ML  SOLN COMPARISON:  05/22/2015 FINDINGS: Atelectasis in the lung bases. Minimal bilateral pleural effusions. Coronary artery calcifications. There is mild diffuse gallbladder wall thickening with some stranding around the gallbladder fat. No stones or bile duct dilatation. Changes are nonspecific but could be associated with cholecystitis in the appropriate clinical setting. 11 mm diameter low-attenuation structure, likely cystic in the head of the pancreas is unchanged since previous study. No pancreatic ductal dilatation. No pancreatic infiltration. The spleen, adrenal glands, kidneys, inferior vena cava, and retroperitoneal lymph nodes are unremarkable. Diffuse calcification of the abdominal aorta without aneurysm. The stomach, small bowel, and colon are not abnormally distended. Diffusely stool-filled colon. No free air or free fluid in the abdomen. Abdominal wall musculature appears intact. Pelvis: Appendix is normal. Mild diffuse wall thickening in the bladder may indicate cystitis. Seed implants in the prostate gland. No pelvic mass or lymphadenopathy. The large amount of stool in the rectosigmoid colon possibly due to constipation. No evidence of diverticulitis. Diffuse bone demineralization. Degenerative changes throughout the lumbar spine. Multiple vertebral compression deformities with post kyphoplasty changes. Prominent degenerative changes in the hips. No focal bone lesions that would suggest metastatic disease.  IMPRESSION: Gallbladder wall thickening with pericholecystic stranding suggesting possible cholecystitis. No gallstones or bile duct dilatation identified. Unchanged appearance of 1.1 cm cystic lesion in the head of the pancreas. Appearance is nonspecific. Diffuse bladder wall thickening may indicate cystitis. Diffusely stool-filled colon may indicate constipation. Electronically Signed   By: WLucienne CapersM.D.   On: 08/24/2015 00:50    ROS: See chart Blood pressure 102/57, pulse 92, temperature 99.4 F (37.4 C), temperature source Oral, resp. rate 20, height 5' 4"  (1.626 m), weight 56.564 kg (124 lb 11.2 oz), SpO2 93 %. Physical Exam: Pleasant white male in no acute distress. Abdomen is soft with minimal tenderness to palpation in the right upper quadrant. No hepatosplenomegaly, masses, hernias, or rigidity are noted.  Assessment/Plan: Impression: Cholecystitis, cholelithiasis, hyponatremia, leukocytosis. Patient's white blood cell count has significantly decreased overnight. Clinically, patient feels much better. He is a high risk surgical candidate, thus would rather treat him medically if at all possible. This was discussed with Dr. FWilley Bladewho agrees. May advance diet as tolerated. [facility] me this weekend should any problems arise. If discharged, would continue a 10 day course of ciprofloxacin.  Elgin Carn A 08/24/2015, 8:47 AM

## 2015-08-24 NOTE — Progress Notes (Signed)
Pharmacy Antibiotic Note  Stephen Bates is a 82 y.o. male admitted on 08/23/2015 with intra abdominal infection.  Pharmacy has been consulted for unasyn dosing.  Plan: Unasyn 3 gm IV q8 hours F/u renal function, cultures and clinical course  Height: 5\' 4"  (162.6 cm) Weight: 124 lb 11.2 oz (56.564 kg) IBW/kg (Calculated) : 59.2  Temp (24hrs), Avg:98.9 F (37.2 C), Min:98.4 F (36.9 C), Max:99.4 F (37.4 C)   Recent Labs Lab 08/23/15 2155 08/23/15 2200  WBC 19.7*  --   CREATININE 0.54*  --   LATICACIDVEN  --  1.3    Estimated Creatinine Clearance: 53.1 mL/min (by C-G formula based on Cr of 0.54).    No Known Allergies  Antimicrobials this admission: Unasyn  2/17  >>     Thank you for allowing pharmacy to be a part of this patient's care.  Beverlee Nims 08/24/2015 7:34 AM

## 2015-08-24 NOTE — H&P (Signed)
PCP:   Asencion Noble, MD   Chief Complaint:  ruq abd pain  HPI: 82 yo male bedbound, dementia, htn comes in with ruq abdominal pain that started earlier tonight.  No fevers.  Pt had episode of painless jaundice back in November which has been overall improving.  Then tonight started with abdominal pain so wife brought him in.  Pt lives at home with wife and has been bed bound for a year.  No n/v/d.  Pt found to have acute cholecystitis per ct scan done [id number].  Pt referrred for admission for surgical evaluation in the am.  Review of Systems:  Positive and negative as per HPI otherwise all other systems are negative  Past Medical History: Past Medical History  Diagnosis Date  . Hypertension   . Prostate cancer ([place])   . Weakness of both legs   . Stroke ([place]) 1982  . Wears glasses   . Arthritis   . Benign neoplasm of colon   . History of right bundle branch block   . Diabetes ([place])   . Mild dementia   . Neuropathy ([place])   . Frequent falls 2012  . Chronic back pain   . Lumbar radiculopathy    Past Surgical History  Procedure Laterality Date  . Eye surgery  2010    both cataracts  . Colonoscopy    . Insertion prostate radiation seed  2001  . Mohs surgery  12/12/2008  . Kyphoplasty    . Ercp N/A 05/24/2015    Procedure: ENDOSCOPIC RETROGRADE CHOLANGIOPANCREATOGRAPHY;  Surgeon: Rogene [place], MD;  Location: [facility];  Service: Endoscopy;  Laterality: N/A;  . Sphincterotomy N/A 05/24/2015    Procedure: BILIARY SPHINCTEROTOMY;  Surgeon: Rogene [place], MD;  Location: [facility];  Service: Endoscopy;  Laterality: N/A;    Medications: Prior to Admission medications   Medication Sig Start Date End Date Taking? Authorizing Provider  aspirin EC 81 MG tablet Take 81 mg by mouth daily.   Yes Historical Provider, MD  benazepril (LOTENSIN) 40 MG tablet Take 1 tablet by mouth every morning.  10/04/13  Yes Historical Provider, MD  metFORMIN (GLUCOPHAGE-XR) 500 MG 24 hr tablet Take 500 mg by  mouth daily. 08/22/15  Yes Historical Provider, MD  mirtazapine (REMERON) 30 MG tablet Take 30 mg by mouth at bedtime.   Yes Historical Provider, MD  Multiple Vitamins-Minerals (CENTRUM SILVER ADULT 50+ PO) Take 1 tablet by mouth every morning.   Yes Historical Provider, MD  AMBULATORY NON FORMULARY MEDICATION 1 Device by Does not apply route daily. Lift Chair 05/11/14   Eustace Quail Tat, DO  docusate sodium (COLACE) 100 MG capsule Take 100 mg by mouth daily as needed for mild constipation.     Historical Provider, MD  polyethylene glycol (MIRALAX / GLYCOLAX) packet Take 17 g by mouth daily as needed for mild constipation.     Historical Provider, MD    Allergies:  No Known Allergies  Social History:  reports that he quit smoking about 46 years ago. He has never used smokeless tobacco. He reports that he does not drink alcohol or use illicit drugs.  Family History: Family History  Problem Relation Age of Onset  . Colon cancer Sister   . Bladder Cancer Father   . Bronchopulmonary dysplasia Mother     Physical Exam: Filed Vitals:   08/23/15 2300 08/23/15 2330 08/24/15 0000 08/24/15 0145  BP: 109/59 119/69 125/68 107/62  Pulse: 103 107 104 120  Temp:  TempSrc:      Resp:  20 18 16   SpO2: 94% 94% 95% 96%   General appearance: alert, cooperative and no distress Head: Normocephalic, without obvious abnormality, atraumatic Eyes: negative Nose: Nares normal. Septum midline. Mucosa normal. No drainage or sinus tenderness. Neck: no JVD and supple, symmetrical, trachea midline Lungs: clear to auscultation bilaterally Heart: regular rate and rhythm, S1, S2 normal, no murmur, click, rub or gallop Abdomen: soft, non-tender; bowel sounds normal; no masses,  no organomegaly Extremities: extremities normal, atraumatic, no cyanosis or edema Pulses: 2+ and symmetric Skin: Skin color, texture, turgor normal. No rashes or lesions Neurologic: Grossly normal  Mild confusion    Labs on  Admission:   Recent Labs  08/23/15 2155  NA 129*  K 4.2  CL 97*  CO2 25  GLUCOSE 174*  BUN 11  CREATININE 0.54*  CALCIUM 9.1    Recent Labs  08/23/15 2155  AST 20  ALT 19  ALKPHOS 85  BILITOT 0.7  PROT 7.1  ALBUMIN 3.6    Recent Labs  08/23/15 2155  LIPASE 30    Recent Labs  08/23/15 2155  WBC 19.7*  HGB 13.2  HCT 41.0  MCV 100.2*  PLT 322   Old chart reviewed  Radiological Exams on Admission: Ct Abdomen Pelvis W Contrast  08/24/2015  CLINICAL DATA:  Sudden onset waxing and waning right upper quadrant abdominal pain radiating to the right lower quadrant for 6 hours. Nausea. History of hypertension, prostate cancer, diabetes, dementia. EXAM: CT ABDOMEN AND PELVIS WITH CONTRAST TECHNIQUE: Multidetector CT imaging of the abdomen and pelvis was performed using the standard protocol following bolus administration of intravenous contrast. CONTRAST:  163mL OMNIPAQUE IOHEXOL 300 MG/ML  SOLN COMPARISON:  05/22/2015 FINDINGS: Atelectasis in the lung bases. Minimal bilateral pleural effusions. Coronary artery calcifications. There is mild diffuse gallbladder wall thickening with some stranding around the gallbladder fat. No stones or bile duct dilatation. Changes are nonspecific but could be associated with cholecystitis in the appropriate clinical setting. 11 mm diameter low-attenuation structure, likely cystic in the head of the pancreas is unchanged since previous study. No pancreatic ductal dilatation. No pancreatic infiltration. The spleen, adrenal glands, kidneys, inferior vena cava, and retroperitoneal lymph nodes are unremarkable. Diffuse calcification of the abdominal aorta without aneurysm. The stomach, small bowel, and colon are not abnormally distended. Diffusely stool-filled colon. No free air or free fluid in the abdomen. Abdominal wall musculature appears intact. Pelvis: Appendix is normal. Mild diffuse wall thickening in the bladder may indicate cystitis. Seed  implants in the prostate gland. No pelvic mass or lymphadenopathy. The large amount of stool in the rectosigmoid colon possibly due to constipation. No evidence of diverticulitis. Diffuse bone demineralization. Degenerative changes throughout the lumbar spine. Multiple vertebral compression deformities with post kyphoplasty changes. Prominent degenerative changes in the hips. No focal bone lesions that would suggest metastatic disease. IMPRESSION: Gallbladder wall thickening with pericholecystic stranding suggesting possible cholecystitis. No gallstones or bile duct dilatation identified. Unchanged appearance of 1.1 cm cystic lesion in the head of the pancreas. Appearance is nonspecific. Diffuse bladder wall thickening may indicate cystitis. Diffusely stool-filled colon may indicate constipation. Electronically Signed   By: Lucienne Capers M.D.   On: 08/24/2015 00:50    Assessment/Plan  82 yo male with acute cholecysitis  Principal Problem:   Acute cholecystitis-  Keep npo.  Hold anticoagulants.  Check preop coags and ekg.  i have discussed options with pt, wife and daughter which include likely surgical removal of  gallbladder vs iv abx with a watch and wait approach.  Have consulted general surgery in the am to discuss options with patient.  They are aware he is high risk surgical candidate.  And would like to speak to surgeon about their options at this point.    Active Problems:   Mild dementia- noted   Hyponatremia- noted,  ivf   RUQ abdominal pain  Discussed code status.  He would like to think about his code status and speak to his wife and daughter about it but is considering DNR.  Full code for now.  Admit to tele.  pcp dr Willey Blade.   [place] A 08/24/2015, 3:50 AM

## 2015-08-24 NOTE — Care Management Note (Signed)
Case Management Note  Patient Details  Name: Stephen Bates MRN: [id number] Date of Birth: February 27, 1927  Subjective/Objective:      Spoke with patient who answers questions appropriately . Patient stated that he is from home with spouse. Pateint is bed bound but stated that he has a [employer] fast streamer, Hospital bed, Wheelchair and "all the equipment, some of it I never use"  Patient is open to [place] care and has a nurse and an aid that come to the house.       Patient stated that his wife is able to help him and "gets around [place]"        Action/Plan:   Return home with home health .  Expected Discharge Date:                  Expected Discharge Plan:  [employer]  In-House Referral:     Discharge planning Services  CM Consult  Post Acute Care Choice:    Choice offered to:     DME Arranged:    DME Agency:     HH Arranged:    [place] Agency:     Status of Service:  In process, will continue to follow  Medicare Important Message Given:  Yes Date Medicare IM Given:    Medicare IM give by:    Date Additional Medicare IM Given:    Additional Medicare Important Message give by:     If discussed at [place] of Stay Meetings, dates discussed:    Additional Comments:  Alvie Heidelberg, RN 08/24/2015, 5:33 PM

## 2015-08-24 NOTE — Care Management Important Message (Signed)
Important Message  Patient Details  Name: Stephen Bates MRN: [id number] Date of Birth: 10/29/1926   Medicare Important Message Given:  Yes    Alvie Heidelberg, RN 08/24/2015, 4:46 PM

## 2015-08-24 NOTE — Progress Notes (Signed)
Subjective: Stephen Bates was admitted by the hospitalist last night after presenting with right upper quadrant pain for 1 day. A CT scan revealed a thickened gallbladder wall and pericholecystic stranding but no gallstones. LFTs were normal. His previous alcoholic hepatitis has resolved. He denies fever. His pain is much better this morning. Surgical consultation is pending. Unasyn has been started.  Objective: Vital signs in last 24 hours: Filed Vitals:   08/24/15 0230 08/24/15 0330 08/24/15 0400 08/24/15 0517  BP: 101/61 120/58 91/50 102/57  Pulse: 98 97 92 92  Temp:    99.4 F (37.4 C)  TempSrc:    Oral  Resp:    20  Height:    5\' 4"  (1.626 m)  Weight:    124 lb 11.2 oz (56.564 kg)  SpO2: 95% 93% 92% 93%   Weight change:   Intake/Output Summary (Last 24 hours) at 08/24/15 0746 Last data filed at 08/24/15 0518  Gross per 24 hour  Intake      0 ml  Output      1 ml  Net     -1 ml    Physical Exam: Alert. No distress. No scleral icterus. Lungs clear. Heart regular with no murmurs. Abdomen soft and mildly tender in the right upper quadrant. Extremities reveal no edema. Neuro at baseline.  Lab Results:    Results for orders placed or performed during the hospital encounter of 08/23/15 (from the past 24 hour(s))  Lipase, blood     Status: None   Collection Time: 08/23/15  9:55 PM  Result Value Ref Range   Lipase 30 11 - 51 U/L  Comprehensive metabolic panel     Status: Abnormal   Collection Time: 08/23/15  9:55 PM  Result Value Ref Range   Sodium 129 (L) 135 - 145 mmol/L   Potassium 4.2 3.5 - 5.1 mmol/L   Chloride 97 (L) 101 - 111 mmol/L   CO2 25 22 - 32 mmol/L   Glucose, Bld 174 (H) 65 - 99 mg/dL   BUN 11 6 - 20 mg/dL   Creatinine, Ser 0.54 (L) 0.61 - 1.24 mg/dL   Calcium 9.1 8.9 - 10.3 mg/dL   Total Protein 7.1 6.5 - 8.1 g/dL   Albumin 3.6 3.5 - 5.0 g/dL   AST 20 15 - 41 U/L   ALT 19 17 - 63 U/L   Alkaline Phosphatase 85 38 - 126 U/L   Total Bilirubin 0.7 0.3 - 1.2 mg/dL    GFR calc non Af Amer >60 >60 mL/min   GFR calc Af Amer >60 >60 mL/min   Anion gap 7 5 - 15  CBC     Status: Abnormal   Collection Time: 08/23/15  9:55 PM  Result Value Ref Range   WBC 19.7 (H) 4.0 - 10.5 K/uL   RBC 4.09 (L) 4.22 - 5.81 MIL/uL   Hemoglobin 13.2 13.0 - 17.0 g/dL   HCT 41.0 39.0 - 52.0 %   MCV 100.2 (H) 78.0 - 100.0 fL   MCH 32.3 26.0 - 34.0 pg   MCHC 32.2 30.0 - 36.0 g/dL   RDW 13.0 11.5 - 15.5 %   Platelets 322 150 - 400 K/uL  Lactic acid, plasma     Status: None   Collection Time: 08/23/15 10:00 PM  Result Value Ref Range   Lactic Acid, Venous 1.3 0.5 - 2.0 mmol/L  Urinalysis, Routine w reflex microscopic (not at [facility])     Status: Abnormal   Collection Time: 08/23/15 10:16 PM  Result Value Ref Range   Color, Urine YELLOW YELLOW   APPearance CLEAR CLEAR   Specific Gravity, Urine 1.015 1.005 - 1.030   pH 6.5 5.0 - 8.0   Glucose, UA NEGATIVE NEGATIVE mg/dL   Hgb urine dipstick NEGATIVE NEGATIVE   Bilirubin Urine NEGATIVE NEGATIVE   Ketones, ur 40 (A) NEGATIVE mg/dL   Protein, ur NEGATIVE NEGATIVE mg/dL   Nitrite NEGATIVE NEGATIVE   Leukocytes, UA NEGATIVE NEGATIVE     ABGS No results for input(s): PHART, PO2ART, TCO2, HCO3 in the last 72 hours.  Invalid input(s): PCO2 CULTURES No results found for this or any previous visit (from the past 240 hour(s)). Studies/Results: Ct Abdomen Pelvis W Contrast  08/24/2015  CLINICAL DATA:  Sudden onset waxing and waning right upper quadrant abdominal pain radiating to the right lower quadrant for 6 hours. Nausea. History of hypertension, prostate cancer, diabetes, dementia. EXAM: CT ABDOMEN AND PELVIS WITH CONTRAST TECHNIQUE: Multidetector CT imaging of the abdomen and pelvis was performed using the standard protocol following bolus administration of intravenous contrast. CONTRAST:  155mL OMNIPAQUE IOHEXOL 300 MG/ML  SOLN COMPARISON:  05/22/2015 FINDINGS: Atelectasis in the lung bases. Minimal bilateral pleural  effusions. Coronary artery calcifications. There is mild diffuse gallbladder wall thickening with some stranding around the gallbladder fat. No stones or bile duct dilatation. Changes are nonspecific but could be associated with cholecystitis in the appropriate clinical setting. 11 mm diameter low-attenuation structure, likely cystic in the head of the pancreas is unchanged since previous study. No pancreatic ductal dilatation. No pancreatic infiltration. The spleen, adrenal glands, kidneys, inferior vena cava, and retroperitoneal lymph nodes are unremarkable. Diffuse calcification of the abdominal aorta without aneurysm. The stomach, small bowel, and colon are not abnormally distended. Diffusely stool-filled colon. No free air or free fluid in the abdomen. Abdominal wall musculature appears intact. Pelvis: Appendix is normal. Mild diffuse wall thickening in the bladder may indicate cystitis. Seed implants in the prostate gland. No pelvic mass or lymphadenopathy. The large amount of stool in the rectosigmoid colon possibly due to constipation. No evidence of diverticulitis. Diffuse bone demineralization. Degenerative changes throughout the lumbar spine. Multiple vertebral compression deformities with post kyphoplasty changes. Prominent degenerative changes in the hips. No focal bone lesions that would suggest metastatic disease. IMPRESSION: Gallbladder wall thickening with pericholecystic stranding suggesting possible cholecystitis. No gallstones or bile duct dilatation identified. Unchanged appearance of 1.1 cm cystic lesion in the head of the pancreas. Appearance is nonspecific. Diffuse bladder wall thickening may indicate cystitis. Diffusely stool-filled colon may indicate constipation. Electronically Signed   By: Lucienne Capers M.D.   On: 08/24/2015 00:50   Micro Results: No results found for this or any previous visit (from the past 240 hour(s)). Studies/Results: Ct Abdomen Pelvis W Contrast  08/24/2015   CLINICAL DATA:  Sudden onset waxing and waning right upper quadrant abdominal pain radiating to the right lower quadrant for 6 hours. Nausea. History of hypertension, prostate cancer, diabetes, dementia. EXAM: CT ABDOMEN AND PELVIS WITH CONTRAST TECHNIQUE: Multidetector CT imaging of the abdomen and pelvis was performed using the standard protocol following bolus administration of intravenous contrast. CONTRAST:  151mL OMNIPAQUE IOHEXOL 300 MG/ML  SOLN COMPARISON:  05/22/2015 FINDINGS: Atelectasis in the lung bases. Minimal bilateral pleural effusions. Coronary artery calcifications. There is mild diffuse gallbladder wall thickening with some stranding around the gallbladder fat. No stones or bile duct dilatation. Changes are nonspecific but could be associated with cholecystitis in the appropriate clinical setting. 11 mm diameter low-attenuation structure, likely  cystic in the head of the pancreas is unchanged since previous study. No pancreatic ductal dilatation. No pancreatic infiltration. The spleen, adrenal glands, kidneys, inferior vena cava, and retroperitoneal lymph nodes are unremarkable. Diffuse calcification of the abdominal aorta without aneurysm. The stomach, small bowel, and colon are not abnormally distended. Diffusely stool-filled colon. No free air or free fluid in the abdomen. Abdominal wall musculature appears intact. Pelvis: Appendix is normal. Mild diffuse wall thickening in the bladder may indicate cystitis. Seed implants in the prostate gland. No pelvic mass or lymphadenopathy. The large amount of stool in the rectosigmoid colon possibly due to constipation. No evidence of diverticulitis. Diffuse bone demineralization. Degenerative changes throughout the lumbar spine. Multiple vertebral compression deformities with post kyphoplasty changes. Prominent degenerative changes in the hips. No focal bone lesions that would suggest metastatic disease. IMPRESSION: Gallbladder wall thickening with  pericholecystic stranding suggesting possible cholecystitis. No gallstones or bile duct dilatation identified. Unchanged appearance of 1.1 cm cystic lesion in the head of the pancreas. Appearance is nonspecific. Diffuse bladder wall thickening may indicate cystitis. Diffusely stool-filled colon may indicate constipation. Electronically Signed   By: Lucienne Capers M.D.   On: 08/24/2015 00:50   Medications:  I have reviewed the patient's current medications Scheduled Meds: . ampicillin-sulbactam (UNASYN) IV  3 g Intravenous Q8H  . sodium chloride flush  3 mL Intravenous Q12H   Continuous Infusions: . sodium chloride    . lactated ringers 75 mL/hr at 08/24/15 0500   PRN Meds:.HYDROmorphone (DILAUDID) injection, ondansetron **OR** ondansetron (ZOFRAN) IV   Assessment/Plan: #1. Acute cholecystitis. Consult general surgery. Continue Unasyn. White count 19.7. Lactic acid normal. #2. Alcoholic hepatitis. Resolved. #3. Alcohol dependence syndrome. In remission. #4. Diabetes. Treat with sliding scale NovoLog. #5. History of stroke. #6. Hyponatremia. 129. #7. History of prostate carcinoma. Principal Problem:   Acute cholecystitis Active Problems:   Mild dementia   Hyponatremia   RUQ abdominal pain     LOS: 0 days   Kaho Selle 08/24/2015, 7:46 AM

## 2015-08-24 NOTE — Progress Notes (Signed)
Initial Nutrition Assessment  DOCUMENTATION CODES:   Non-severe (moderate) malnutrition in context of acute illness/injury  INTERVENTION:  -Provide Boost Breeze BID, 250 kcal, 9 grams of proteinper bottle  NUTRITION DIAGNOSIS:   Inadequate oral intake related to acute illness, poor appetite as evidenced by per patient/family report.  GOAL:   Patient will meet greater than or equal to 90% of their needs  MONITOR:   PO intake, Supplement acceptance, Diet advancement, Weight trends, Labs  REASON FOR ASSESSMENT:   Low Braden  ASSESSMENT:   Pt with a h/o HTN, prostate cancer, DM, and dementia, brought in by ambulance, who presents to the Emergency Department complaining of sudden onset, RUQ abdominal pain. The pt reports associated nausea but denies vomiting, diarrhea, constipation, fevers, flank pain, and dysuria. PShx of ERCP performed by Dr. Laural Golden 3 months ago s/p elevated LFTs and painfuless jaundice when he was admitted to the hospital by Dr. Willey Blade. The pt denies similar abdominal pain at time of ERCP procedure, however cholelithiasis was viewed on MRI that was obtained 3 months ago.   Pt seen for low Braden score. Pt's wife states that his appetite has been good until 2 day ago. She states that this morning pt consumed Jell-O and cranberry juice. PTA pt takes a multivitamin and drinks an Atkins protein drink daily. While pt is on a clear liquid diet will provide Boost Breeze BID.   Per pt's wife she reports he looks like he has lost weight, goes on to believe he has lost 10 lb (8%).  Conducted nutrition focused physical exam, identified severe muscle wasting and moderate fat wasting. Can be contributed to pt bed bound status.  Medications reviewed. Labs reviewed; Sodium 134  Diet Order:  Diet full liquid Room service appropriate?: Yes; Fluid consistency:: Thin  Skin:  Reviewed, no issues  Last BM:  08/23/2015  Height:   Ht Readings from Last 1 Encounters:  08/24/15 5'  4" (1.626 m)    Weight:   Wt Readings from Last 1 Encounters:  08/24/15 124 lb 11.2 oz (56.564 kg)    Ideal Body Weight:  59 kg  BMI:  Body mass index is 21.39 kg/(m^2).  Estimated Nutritional Needs:   Kcal:  1350-1550  Protein:  60-70 (1 g/kg IBW)  Fluid:  1.2-1.4 L  EDUCATION NEEDS:   No education needs identified at this time  [place], Dietetic Intern Pager: [phone]

## 2015-08-25 LAB — COMPREHENSIVE METABOLIC PANEL
ALK PHOS: 63 U/L (ref 38–126)
ALT: 14 U/L — AB (ref 17–63)
AST: 15 U/L (ref 15–41)
Albumin: 2.9 g/dL — ABNORMAL LOW (ref 3.5–5.0)
Anion gap: 7 (ref 5–15)
BUN: 6 mg/dL (ref 6–20)
CALCIUM: 8.7 mg/dL — AB (ref 8.9–10.3)
CHLORIDE: 100 mmol/L — AB (ref 101–111)
CO2: 28 mmol/L (ref 22–32)
CREATININE: 0.41 mg/dL — AB (ref 0.61–1.24)
Glucose, Bld: 115 mg/dL — ABNORMAL HIGH (ref 65–99)
Potassium: 3.7 mmol/L (ref 3.5–5.1)
SODIUM: 135 mmol/L (ref 135–145)
Total Bilirubin: 0.7 mg/dL (ref 0.3–1.2)
Total Protein: 6.3 g/dL — ABNORMAL LOW (ref 6.5–8.1)

## 2015-08-25 LAB — CBC
HCT: 33 % — ABNORMAL LOW (ref 39.0–52.0)
HEMOGLOBIN: 11 g/dL — AB (ref 13.0–17.0)
MCH: 33.6 pg (ref 26.0–34.0)
MCHC: 33.3 g/dL (ref 30.0–36.0)
MCV: 100.9 fL — AB (ref 78.0–100.0)
Platelets: 306 10*3/uL (ref 150–400)
RBC: 3.27 MIL/uL — AB (ref 4.22–5.81)
RDW: 13.1 % (ref 11.5–15.5)
WBC: 12.5 10*3/uL — AB (ref 4.0–10.5)

## 2015-08-25 LAB — GLUCOSE, CAPILLARY
GLUCOSE-CAPILLARY: 170 mg/dL — AB (ref 65–99)
GLUCOSE-CAPILLARY: 112 mg/dL — AB (ref 65–99)
GLUCOSE-CAPILLARY: 259 mg/dL — AB (ref 65–99)
Glucose-Capillary: 216 mg/dL — ABNORMAL HIGH (ref 65–99)
Glucose-Capillary: 101 mg/dL — ABNORMAL HIGH (ref 65–99)

## 2015-08-25 LAB — HEMOGLOBIN A1C
Hgb A1c MFr Bld: 6.1 % — ABNORMAL HIGH (ref 4.8–5.6)
Mean Plasma Glucose: 128 mg/dL

## 2015-08-25 MED ORDER — DOCUSATE SODIUM 100 MG PO CAPS
100.0000 mg | ORAL_CAPSULE | Freq: Every day | ORAL | Status: DC
  Administered 2015-08-25 – 2015-08-26 (×2): 100 mg via ORAL
  Filled 2015-08-25 (×2): qty 1

## 2015-08-25 MED ORDER — MIRTAZAPINE 30 MG PO TABS
30.0000 mg | ORAL_TABLET | Freq: Every day | ORAL | Status: DC
  Filled 2015-08-25: qty 1

## 2015-08-25 NOTE — Progress Notes (Signed)
Subjective: Stephen Bates states that he feels some soreness in his abdomen today but denies significant pain. He denies vomiting or fever. He tolerated a full liquid diet without difficulty.  Objective: Vital signs in last 24 hours: Filed Vitals:   08/24/15 1300 08/24/15 2002 08/24/15 2054 08/25/15 0425  BP: 101/51  116/62 122/64  Pulse: 91  71 68  Temp: 98.7 F (37.1 C)  98.5 F (36.9 C) 98 F (36.7 C)  TempSrc: Oral  Oral Oral  Resp: 20  15 20   Height:      Weight:    125 lb 8 oz (56.926 kg)  SpO2: 94% 92% 99% 97%   Weight change: 12.8 oz (0.363 kg)  Intake/Output Summary (Last 24 hours) at 08/25/15 0753 Last data filed at 08/25/15 0617  Gross per 24 hour  Intake 2131.75 ml  Output    850 ml  Net 1281.75 ml    Physical Exam: Alert and comfortable appearing. Lungs clear. Heart regular with no murmurs. Abdomen soft and minimally tender in the right upper quadrant. Neuro stable.  Lab Results:    Results for orders placed or performed during the hospital encounter of 08/23/15 (from the past 24 hour(s))  Glucose, capillary     Status: Abnormal   Collection Time: 08/24/15 10:28 AM  Result Value Ref Range   Glucose-Capillary 105 (H) 65 - 99 mg/dL   Comment 1 Notify RN    Comment 2 Document in Chart   Glucose, capillary     Status: Abnormal   Collection Time: 08/24/15 11:25 AM  Result Value Ref Range   Glucose-Capillary 104 (H) 65 - 99 mg/dL   Comment 1 Notify RN    Comment 2 Document in Chart   Glucose, capillary     Status: Abnormal   Collection Time: 08/24/15  4:09 PM  Result Value Ref Range   Glucose-Capillary 279 (H) 65 - 99 mg/dL   Comment 1 Notify RN    Comment 2 Document in Chart   Glucose, capillary     Status: Abnormal   Collection Time: 08/24/15  8:48 PM  Result Value Ref Range   Glucose-Capillary 170 (H) 65 - 99 mg/dL   Comment 1 Notify RN    Comment 2 Document in Chart   CBC     Status: Abnormal   Collection Time: 08/24/15 11:32 PM  Result Value  Ref Range   WBC 12.5 (H) 4.0 - 10.5 K/uL   RBC 3.27 (L) 4.22 - 5.81 MIL/uL   Hemoglobin 11.0 (L) 13.0 - 17.0 g/dL   HCT 33.0 (L) 39.0 - 52.0 %   MCV 100.9 (H) 78.0 - 100.0 fL   MCH 33.6 26.0 - 34.0 pg   MCHC 33.3 30.0 - 36.0 g/dL   RDW 13.1 11.5 - 15.5 %   Platelets 306 150 - 400 K/uL     ABGS No results for input(s): PHART, PO2ART, TCO2, HCO3 in the last 72 hours.  Invalid input(s): PCO2 CULTURES No results found for this or any previous visit (from the past 240 hour(s)). Studies/Results: Ct Abdomen Pelvis W Contrast  08/24/2015  CLINICAL DATA:  Sudden onset waxing and waning right upper quadrant abdominal pain radiating to the right lower quadrant for 6 hours. Nausea. History of hypertension, prostate cancer, diabetes, dementia. EXAM: CT ABDOMEN AND PELVIS WITH CONTRAST TECHNIQUE: Multidetector CT imaging of the abdomen and pelvis was performed using the standard protocol following bolus administration of intravenous contrast. CONTRAST:  116mL OMNIPAQUE IOHEXOL 300 MG/ML  SOLN COMPARISON:  05/22/2015 FINDINGS: Atelectasis in the lung bases. Minimal bilateral pleural effusions. Coronary artery calcifications. There is mild diffuse gallbladder wall thickening with some stranding around the gallbladder fat. No stones or bile duct dilatation. Changes are nonspecific but could be associated with cholecystitis in the appropriate clinical setting. 11 mm diameter low-attenuation structure, likely cystic in the head of the pancreas is unchanged since previous study. No pancreatic ductal dilatation. No pancreatic infiltration. The spleen, adrenal glands, kidneys, inferior vena cava, and retroperitoneal lymph nodes are unremarkable. Diffuse calcification of the abdominal aorta without aneurysm. The stomach, small bowel, and colon are not abnormally distended. Diffusely stool-filled colon. No free air or free fluid in the abdomen. Abdominal wall musculature appears intact. Pelvis: Appendix is normal. Mild  diffuse wall thickening in the bladder may indicate cystitis. Seed implants in the prostate gland. No pelvic mass or lymphadenopathy. The large amount of stool in the rectosigmoid colon possibly due to constipation. No evidence of diverticulitis. Diffuse bone demineralization. Degenerative changes throughout the lumbar spine. Multiple vertebral compression deformities with post kyphoplasty changes. Prominent degenerative changes in the hips. No focal bone lesions that would suggest metastatic disease. IMPRESSION: Gallbladder wall thickening with pericholecystic stranding suggesting possible cholecystitis. No gallstones or bile duct dilatation identified. Unchanged appearance of 1.1 cm cystic lesion in the head of the pancreas. Appearance is nonspecific. Diffuse bladder wall thickening may indicate cystitis. Diffusely stool-filled colon may indicate constipation. Electronically Signed   By: Lucienne Capers M.D.   On: 08/24/2015 00:50   Micro Results: No results found for this or any previous visit (from the past 240 hour(s)). Studies/Results: Ct Abdomen Pelvis W Contrast  08/24/2015  CLINICAL DATA:  Sudden onset waxing and waning right upper quadrant abdominal pain radiating to the right lower quadrant for 6 hours. Nausea. History of hypertension, prostate cancer, diabetes, dementia. EXAM: CT ABDOMEN AND PELVIS WITH CONTRAST TECHNIQUE: Multidetector CT imaging of the abdomen and pelvis was performed using the standard protocol following bolus administration of intravenous contrast. CONTRAST:  135mL OMNIPAQUE IOHEXOL 300 MG/ML  SOLN COMPARISON:  05/22/2015 FINDINGS: Atelectasis in the lung bases. Minimal bilateral pleural effusions. Coronary artery calcifications. There is mild diffuse gallbladder wall thickening with some stranding around the gallbladder fat. No stones or bile duct dilatation. Changes are nonspecific but could be associated with cholecystitis in the appropriate clinical setting. 11 mm diameter  low-attenuation structure, likely cystic in the head of the pancreas is unchanged since previous study. No pancreatic ductal dilatation. No pancreatic infiltration. The spleen, adrenal glands, kidneys, inferior vena cava, and retroperitoneal lymph nodes are unremarkable. Diffuse calcification of the abdominal aorta without aneurysm. The stomach, small bowel, and colon are not abnormally distended. Diffusely stool-filled colon. No free air or free fluid in the abdomen. Abdominal wall musculature appears intact. Pelvis: Appendix is normal. Mild diffuse wall thickening in the bladder may indicate cystitis. Seed implants in the prostate gland. No pelvic mass or lymphadenopathy. The large amount of stool in the rectosigmoid colon possibly due to constipation. No evidence of diverticulitis. Diffuse bone demineralization. Degenerative changes throughout the lumbar spine. Multiple vertebral compression deformities with post kyphoplasty changes. Prominent degenerative changes in the hips. No focal bone lesions that would suggest metastatic disease. IMPRESSION: Gallbladder wall thickening with pericholecystic stranding suggesting possible cholecystitis. No gallstones or bile duct dilatation identified. Unchanged appearance of 1.1 cm cystic lesion in the head of the pancreas. Appearance is nonspecific. Diffuse bladder wall thickening may indicate cystitis. Diffusely stool-filled colon may indicate constipation. Electronically Signed  By: Lucienne Capers M.D.   On: 08/24/2015 00:50   Medications:  I have reviewed the patient's current medications Scheduled Meds: . ampicillin-sulbactam (UNASYN) IV  3 g Intravenous Q8H  . docusate sodium  100 mg Oral Daily  . feeding supplement  1 Container Oral BID BM  . insulin aspart  0-5 Units Subcutaneous QHS  . insulin aspart  0-9 Units Subcutaneous TID WC  . mirtazapine  30 mg Oral QHS  . sodium chloride flush  3 mL Intravenous Q12H   Continuous Infusions:  PRN  Meds:.HYDROmorphone (DILAUDID) injection, ondansetron **OR** ondansetron (ZOFRAN) IV   Assessment/Plan: #1. Cholecystitis. Leukocytosis improved. Continue Unasyn. Discussed with surgery. Hope to avoid cholecystectomy. #2. Hyponatremia. Improved. Discontinue IV fluids. #3. Diabetes. Stable. Continue holding metformin. Hemoglobin A1c is 6.1.  Advance diet to a heart healthy diet today. Principal Problem:   Acute cholecystitis Active Problems:   Mild dementia   Hyponatremia   RUQ abdominal pain     LOS: 1 day   Stephen Bates 08/25/2015, 7:53 AM

## 2015-08-26 LAB — CBC
HCT: 36.9 % — ABNORMAL LOW (ref 39.0–52.0)
Hemoglobin: 12.2 g/dL — ABNORMAL LOW (ref 13.0–17.0)
MCH: 32.8 pg (ref 26.0–34.0)
MCHC: 33.1 g/dL (ref 30.0–36.0)
MCV: 99.2 fL (ref 78.0–100.0)
Platelets: 321 10*3/uL (ref 150–400)
RBC: 3.72 MIL/uL — ABNORMAL LOW (ref 4.22–5.81)
RDW: 12.7 % (ref 11.5–15.5)
WBC: 10.1 10*3/uL (ref 4.0–10.5)

## 2015-08-26 LAB — BASIC METABOLIC PANEL
ANION GAP: 7 (ref 5–15)
BUN: 5 mg/dL — ABNORMAL LOW (ref 6–20)
CALCIUM: 8.8 mg/dL — AB (ref 8.9–10.3)
CO2: 26 mmol/L (ref 22–32)
CREATININE: 0.32 mg/dL — AB (ref 0.61–1.24)
Chloride: 103 mmol/L (ref 101–111)
Glucose, Bld: 136 mg/dL — ABNORMAL HIGH (ref 65–99)
Potassium: 3.5 mmol/L (ref 3.5–5.1)
SODIUM: 136 mmol/L (ref 135–145)

## 2015-08-26 LAB — GLUCOSE, CAPILLARY
GLUCOSE-CAPILLARY: 129 mg/dL — AB (ref 65–99)
GLUCOSE-CAPILLARY: 210 mg/dL — AB (ref 65–99)

## 2015-08-26 MED ORDER — CIPROFLOXACIN HCL 500 MG PO TABS: 500.0000 mg | ORAL_TABLET | Freq: Two times a day (BID) | ORAL | Status: DC

## 2015-08-26 NOTE — Discharge Summary (Signed)
Physician Discharge Summary  Stephen Bates [id number] DOB: [birth year] DOA: 08/23/2015   Admit date: 08/23/2015 Discharge date: 08/26/2015  Discharge Diagnoses:  Principal Problem:   Acute cholecystitis Active Problems:   Mild dementia   Hyponatremia   RUQ abdominal pain    Wt Readings from Last 3 Encounters:  08/26/15 126 lb 4.8 oz (57.289 kg)  05/25/15 124 lb 14.4 oz (56.654 kg)  03/28/14 122 lb (55.339 kg)     Hospital Course:  This patient is an 82 year old male who presented with right upper quadrant pain. He had a leukocytosis of 19.7. A CT scan of the abdomen revealed findings consistent with acute cholecystitis. Gallbladder wall thickening with pericholecystic stranding was present. There was no evidence of gallstones or bile duct dilation. He had a stable 1.1 cm pancreatic head cyst. LFTs were normal. Due to his surgical risk the decision was made to treat medically with intravenous antibiotics. He was treated with Unasyn. He was seen in surgical consultation by Dr. Arnoldo Morale. Her symptoms resolved. His leukocytosis resolved. White count is now 10.1. He is not expressing fever. His diet was advanced without complication.  His exam now reveals a soft and nontender abdomen. He is afebrile.  Diabetes control has been stable. He received sliding scale NovoLog while hospitalized. He will restart metformin tomorrow. Hemoglobin A1c is 6.1.  He'll be seen in follow-up in one week.  Antibiotic therapy will be continued with Cipro. He will return promptly if he develops severe right upper quadrant pain again.   Discharge Instructions     Medication List    TAKE these medications        AMBULATORY NON FORMULARY MEDICATION  1 Device by Does not apply route daily. Lift Chair     aspirin EC 81 MG tablet  Take 81 mg by mouth daily.     benazepril 40 MG tablet  Commonly known as:  LOTENSIN  Take 1 tablet by mouth every morning.     CENTRUM SILVER ADULT 50+ PO  Take 1  tablet by mouth every morning.     ciprofloxacin 500 MG tablet  Commonly known as:  CIPRO  Take 1 tablet (500 mg total) by mouth 2 (two) times daily.     docusate sodium 100 MG capsule  Commonly known as:  COLACE  Take 100 mg by mouth daily as needed for mild constipation.     metFORMIN 500 MG 24 hr tablet  Commonly known as:  GLUCOPHAGE-XR  Take 500 mg by mouth daily.     mirtazapine 30 MG tablet  Commonly known as:  REMERON  Take 30 mg by mouth at bedtime.     polyethylene glycol packet  Commonly known as:  MIRALAX / GLYCOLAX  Take 17 g by mouth daily as needed for mild constipation.         [name] 08/26/2015

## 2015-08-26 NOTE — Progress Notes (Signed)
Pt IV and telemetry removed, tolerated well.  Reviewed discharge instructions with pt and wife, answered questions at this time.  Will continue to monitor until pt leaves the floor.

## 2015-09-12 DIAGNOSIS — K819 Cholecystitis, unspecified: Secondary | ICD-10-CM | POA: Diagnosis not present

## 2015-10-08 DIAGNOSIS — E119 Type 2 diabetes mellitus without complications: Secondary | ICD-10-CM | POA: Diagnosis not present

## 2015-10-08 DIAGNOSIS — D649 Anemia, unspecified: Secondary | ICD-10-CM | POA: Diagnosis not present

## 2015-10-08 DIAGNOSIS — L89123 Pressure ulcer of left upper back, stage 3: Secondary | ICD-10-CM | POA: Diagnosis not present

## 2015-10-08 DIAGNOSIS — L89113 Pressure ulcer of right upper back, stage 3: Secondary | ICD-10-CM | POA: Diagnosis not present

## 2015-10-08 DIAGNOSIS — I4510 Unspecified right bundle-branch block: Secondary | ICD-10-CM | POA: Diagnosis not present

## 2015-10-08 DIAGNOSIS — Z85038 Personal history of other malignant neoplasm of large intestine: Secondary | ICD-10-CM | POA: Diagnosis not present

## 2015-10-08 DIAGNOSIS — L89122 Pressure ulcer of left upper back, stage 2: Secondary | ICD-10-CM | POA: Diagnosis not present

## 2015-10-08 DIAGNOSIS — L89112 Pressure ulcer of right upper back, stage 2: Secondary | ICD-10-CM | POA: Diagnosis not present

## 2015-10-08 DIAGNOSIS — Z4800 Encounter for change or removal of nonsurgical wound dressing: Secondary | ICD-10-CM | POA: Diagnosis not present

## 2015-10-08 DIAGNOSIS — Z8546 Personal history of malignant neoplasm of prostate: Secondary | ICD-10-CM | POA: Diagnosis not present

## 2015-10-08 DIAGNOSIS — I25110 Atherosclerotic heart disease of native coronary artery with unstable angina pectoris: Secondary | ICD-10-CM | POA: Diagnosis not present

## 2015-10-08 DIAGNOSIS — Z7401 Bed confinement status: Secondary | ICD-10-CM | POA: Diagnosis not present

## 2015-10-08 DIAGNOSIS — D7589 Other specified diseases of blood and blood-forming organs: Secondary | ICD-10-CM | POA: Diagnosis not present

## 2015-10-08 DIAGNOSIS — N400 Enlarged prostate without lower urinary tract symptoms: Secondary | ICD-10-CM | POA: Diagnosis not present

## 2015-10-08 DIAGNOSIS — I10 Essential (primary) hypertension: Secondary | ICD-10-CM | POA: Diagnosis not present

## 2015-10-08 DIAGNOSIS — L989 Disorder of the skin and subcutaneous tissue, unspecified: Secondary | ICD-10-CM | POA: Diagnosis not present

## 2015-10-09 DIAGNOSIS — L89112 Pressure ulcer of right upper back, stage 2: Secondary | ICD-10-CM | POA: Diagnosis not present

## 2015-10-09 DIAGNOSIS — L89122 Pressure ulcer of left upper back, stage 2: Secondary | ICD-10-CM | POA: Diagnosis not present

## 2015-10-09 DIAGNOSIS — L989 Disorder of the skin and subcutaneous tissue, unspecified: Secondary | ICD-10-CM | POA: Diagnosis not present

## 2015-10-09 DIAGNOSIS — L89123 Pressure ulcer of left upper back, stage 3: Secondary | ICD-10-CM | POA: Diagnosis not present

## 2015-10-09 DIAGNOSIS — L89113 Pressure ulcer of right upper back, stage 3: Secondary | ICD-10-CM | POA: Diagnosis not present

## 2015-10-09 DIAGNOSIS — D7589 Other specified diseases of blood and blood-forming organs: Secondary | ICD-10-CM | POA: Diagnosis not present

## 2015-10-10 DIAGNOSIS — L89113 Pressure ulcer of right upper back, stage 3: Secondary | ICD-10-CM | POA: Diagnosis not present

## 2015-10-10 DIAGNOSIS — L89112 Pressure ulcer of right upper back, stage 2: Secondary | ICD-10-CM | POA: Diagnosis not present

## 2015-10-10 DIAGNOSIS — L89123 Pressure ulcer of left upper back, stage 3: Secondary | ICD-10-CM | POA: Diagnosis not present

## 2015-10-10 DIAGNOSIS — D7589 Other specified diseases of blood and blood-forming organs: Secondary | ICD-10-CM | POA: Diagnosis not present

## 2015-10-10 DIAGNOSIS — L89122 Pressure ulcer of left upper back, stage 2: Secondary | ICD-10-CM | POA: Diagnosis not present

## 2015-10-10 DIAGNOSIS — L989 Disorder of the skin and subcutaneous tissue, unspecified: Secondary | ICD-10-CM | POA: Diagnosis not present

## 2015-10-11 DIAGNOSIS — D7589 Other specified diseases of blood and blood-forming organs: Secondary | ICD-10-CM | POA: Diagnosis not present

## 2015-10-11 DIAGNOSIS — L89112 Pressure ulcer of right upper back, stage 2: Secondary | ICD-10-CM | POA: Diagnosis not present

## 2015-10-11 DIAGNOSIS — L89123 Pressure ulcer of left upper back, stage 3: Secondary | ICD-10-CM | POA: Diagnosis not present

## 2015-10-11 DIAGNOSIS — L89122 Pressure ulcer of left upper back, stage 2: Secondary | ICD-10-CM | POA: Diagnosis not present

## 2015-10-11 DIAGNOSIS — L89113 Pressure ulcer of right upper back, stage 3: Secondary | ICD-10-CM | POA: Diagnosis not present

## 2015-10-11 DIAGNOSIS — L989 Disorder of the skin and subcutaneous tissue, unspecified: Secondary | ICD-10-CM | POA: Diagnosis not present

## 2015-10-12 DIAGNOSIS — L89112 Pressure ulcer of right upper back, stage 2: Secondary | ICD-10-CM | POA: Diagnosis not present

## 2015-10-12 DIAGNOSIS — L89123 Pressure ulcer of left upper back, stage 3: Secondary | ICD-10-CM | POA: Diagnosis not present

## 2015-10-12 DIAGNOSIS — L989 Disorder of the skin and subcutaneous tissue, unspecified: Secondary | ICD-10-CM | POA: Diagnosis not present

## 2015-10-12 DIAGNOSIS — L89122 Pressure ulcer of left upper back, stage 2: Secondary | ICD-10-CM | POA: Diagnosis not present

## 2015-10-12 DIAGNOSIS — D7589 Other specified diseases of blood and blood-forming organs: Secondary | ICD-10-CM | POA: Diagnosis not present

## 2015-10-12 DIAGNOSIS — L89113 Pressure ulcer of right upper back, stage 3: Secondary | ICD-10-CM | POA: Diagnosis not present

## 2015-10-15 DIAGNOSIS — D7589 Other specified diseases of blood and blood-forming organs: Secondary | ICD-10-CM | POA: Diagnosis not present

## 2015-10-15 DIAGNOSIS — L89113 Pressure ulcer of right upper back, stage 3: Secondary | ICD-10-CM | POA: Diagnosis not present

## 2015-10-15 DIAGNOSIS — L989 Disorder of the skin and subcutaneous tissue, unspecified: Secondary | ICD-10-CM | POA: Diagnosis not present

## 2015-10-15 DIAGNOSIS — L89112 Pressure ulcer of right upper back, stage 2: Secondary | ICD-10-CM | POA: Diagnosis not present

## 2015-10-15 DIAGNOSIS — L89122 Pressure ulcer of left upper back, stage 2: Secondary | ICD-10-CM | POA: Diagnosis not present

## 2015-10-15 DIAGNOSIS — L89123 Pressure ulcer of left upper back, stage 3: Secondary | ICD-10-CM | POA: Diagnosis not present

## 2015-10-16 DIAGNOSIS — L89123 Pressure ulcer of left upper back, stage 3: Secondary | ICD-10-CM | POA: Diagnosis not present

## 2015-10-16 DIAGNOSIS — L989 Disorder of the skin and subcutaneous tissue, unspecified: Secondary | ICD-10-CM | POA: Diagnosis not present

## 2015-10-16 DIAGNOSIS — L89122 Pressure ulcer of left upper back, stage 2: Secondary | ICD-10-CM | POA: Diagnosis not present

## 2015-10-16 DIAGNOSIS — D7589 Other specified diseases of blood and blood-forming organs: Secondary | ICD-10-CM | POA: Diagnosis not present

## 2015-10-16 DIAGNOSIS — L89113 Pressure ulcer of right upper back, stage 3: Secondary | ICD-10-CM | POA: Diagnosis not present

## 2015-10-16 DIAGNOSIS — L89112 Pressure ulcer of right upper back, stage 2: Secondary | ICD-10-CM | POA: Diagnosis not present

## 2015-10-17 DIAGNOSIS — L89113 Pressure ulcer of right upper back, stage 3: Secondary | ICD-10-CM | POA: Diagnosis not present

## 2015-10-17 DIAGNOSIS — L89123 Pressure ulcer of left upper back, stage 3: Secondary | ICD-10-CM | POA: Diagnosis not present

## 2015-10-17 DIAGNOSIS — L989 Disorder of the skin and subcutaneous tissue, unspecified: Secondary | ICD-10-CM | POA: Diagnosis not present

## 2015-10-17 DIAGNOSIS — D7589 Other specified diseases of blood and blood-forming organs: Secondary | ICD-10-CM | POA: Diagnosis not present

## 2015-10-17 DIAGNOSIS — L89112 Pressure ulcer of right upper back, stage 2: Secondary | ICD-10-CM | POA: Diagnosis not present

## 2015-10-17 DIAGNOSIS — L89122 Pressure ulcer of left upper back, stage 2: Secondary | ICD-10-CM | POA: Diagnosis not present

## 2015-10-18 DIAGNOSIS — D7589 Other specified diseases of blood and blood-forming organs: Secondary | ICD-10-CM | POA: Diagnosis not present

## 2015-10-18 DIAGNOSIS — L89123 Pressure ulcer of left upper back, stage 3: Secondary | ICD-10-CM | POA: Diagnosis not present

## 2015-10-18 DIAGNOSIS — L989 Disorder of the skin and subcutaneous tissue, unspecified: Secondary | ICD-10-CM | POA: Diagnosis not present

## 2015-10-18 DIAGNOSIS — L89112 Pressure ulcer of right upper back, stage 2: Secondary | ICD-10-CM | POA: Diagnosis not present

## 2015-10-18 DIAGNOSIS — L89113 Pressure ulcer of right upper back, stage 3: Secondary | ICD-10-CM | POA: Diagnosis not present

## 2015-10-18 DIAGNOSIS — L89122 Pressure ulcer of left upper back, stage 2: Secondary | ICD-10-CM | POA: Diagnosis not present

## 2015-10-19 DIAGNOSIS — D7589 Other specified diseases of blood and blood-forming organs: Secondary | ICD-10-CM | POA: Diagnosis not present

## 2015-10-19 DIAGNOSIS — L89113 Pressure ulcer of right upper back, stage 3: Secondary | ICD-10-CM | POA: Diagnosis not present

## 2015-10-19 DIAGNOSIS — L989 Disorder of the skin and subcutaneous tissue, unspecified: Secondary | ICD-10-CM | POA: Diagnosis not present

## 2015-10-19 DIAGNOSIS — L89123 Pressure ulcer of left upper back, stage 3: Secondary | ICD-10-CM | POA: Diagnosis not present

## 2015-10-19 DIAGNOSIS — L89122 Pressure ulcer of left upper back, stage 2: Secondary | ICD-10-CM | POA: Diagnosis not present

## 2015-10-19 DIAGNOSIS — L89112 Pressure ulcer of right upper back, stage 2: Secondary | ICD-10-CM | POA: Diagnosis not present

## 2015-10-22 DIAGNOSIS — L89123 Pressure ulcer of left upper back, stage 3: Secondary | ICD-10-CM | POA: Diagnosis not present

## 2015-10-22 DIAGNOSIS — L89112 Pressure ulcer of right upper back, stage 2: Secondary | ICD-10-CM | POA: Diagnosis not present

## 2015-10-22 DIAGNOSIS — D7589 Other specified diseases of blood and blood-forming organs: Secondary | ICD-10-CM | POA: Diagnosis not present

## 2015-10-22 DIAGNOSIS — L89122 Pressure ulcer of left upper back, stage 2: Secondary | ICD-10-CM | POA: Diagnosis not present

## 2015-10-22 DIAGNOSIS — L89113 Pressure ulcer of right upper back, stage 3: Secondary | ICD-10-CM | POA: Diagnosis not present

## 2015-10-22 DIAGNOSIS — L989 Disorder of the skin and subcutaneous tissue, unspecified: Secondary | ICD-10-CM | POA: Diagnosis not present

## 2015-10-24 DIAGNOSIS — L89113 Pressure ulcer of right upper back, stage 3: Secondary | ICD-10-CM | POA: Diagnosis not present

## 2015-10-24 DIAGNOSIS — D7589 Other specified diseases of blood and blood-forming organs: Secondary | ICD-10-CM | POA: Diagnosis not present

## 2015-10-24 DIAGNOSIS — L89122 Pressure ulcer of left upper back, stage 2: Secondary | ICD-10-CM | POA: Diagnosis not present

## 2015-10-24 DIAGNOSIS — L89123 Pressure ulcer of left upper back, stage 3: Secondary | ICD-10-CM | POA: Diagnosis not present

## 2015-10-24 DIAGNOSIS — L89112 Pressure ulcer of right upper back, stage 2: Secondary | ICD-10-CM | POA: Diagnosis not present

## 2015-10-24 DIAGNOSIS — L989 Disorder of the skin and subcutaneous tissue, unspecified: Secondary | ICD-10-CM | POA: Diagnosis not present

## 2015-10-25 DIAGNOSIS — D7589 Other specified diseases of blood and blood-forming organs: Secondary | ICD-10-CM | POA: Diagnosis not present

## 2015-10-25 DIAGNOSIS — L989 Disorder of the skin and subcutaneous tissue, unspecified: Secondary | ICD-10-CM | POA: Diagnosis not present

## 2015-10-25 DIAGNOSIS — L89112 Pressure ulcer of right upper back, stage 2: Secondary | ICD-10-CM | POA: Diagnosis not present

## 2015-10-25 DIAGNOSIS — L89123 Pressure ulcer of left upper back, stage 3: Secondary | ICD-10-CM | POA: Diagnosis not present

## 2015-10-25 DIAGNOSIS — L89113 Pressure ulcer of right upper back, stage 3: Secondary | ICD-10-CM | POA: Diagnosis not present

## 2015-10-25 DIAGNOSIS — L89122 Pressure ulcer of left upper back, stage 2: Secondary | ICD-10-CM | POA: Diagnosis not present

## 2015-10-26 DIAGNOSIS — D7589 Other specified diseases of blood and blood-forming organs: Secondary | ICD-10-CM | POA: Diagnosis not present

## 2015-10-26 DIAGNOSIS — L89112 Pressure ulcer of right upper back, stage 2: Secondary | ICD-10-CM | POA: Diagnosis not present

## 2015-10-26 DIAGNOSIS — L89113 Pressure ulcer of right upper back, stage 3: Secondary | ICD-10-CM | POA: Diagnosis not present

## 2015-10-26 DIAGNOSIS — L89122 Pressure ulcer of left upper back, stage 2: Secondary | ICD-10-CM | POA: Diagnosis not present

## 2015-10-26 DIAGNOSIS — L989 Disorder of the skin and subcutaneous tissue, unspecified: Secondary | ICD-10-CM | POA: Diagnosis not present

## 2015-10-26 DIAGNOSIS — L89123 Pressure ulcer of left upper back, stage 3: Secondary | ICD-10-CM | POA: Diagnosis not present

## 2015-10-30 DIAGNOSIS — L989 Disorder of the skin and subcutaneous tissue, unspecified: Secondary | ICD-10-CM | POA: Diagnosis not present

## 2015-10-30 DIAGNOSIS — L89112 Pressure ulcer of right upper back, stage 2: Secondary | ICD-10-CM | POA: Diagnosis not present

## 2015-10-30 DIAGNOSIS — L89123 Pressure ulcer of left upper back, stage 3: Secondary | ICD-10-CM | POA: Diagnosis not present

## 2015-10-30 DIAGNOSIS — D7589 Other specified diseases of blood and blood-forming organs: Secondary | ICD-10-CM | POA: Diagnosis not present

## 2015-10-30 DIAGNOSIS — L89113 Pressure ulcer of right upper back, stage 3: Secondary | ICD-10-CM | POA: Diagnosis not present

## 2015-10-30 DIAGNOSIS — L89122 Pressure ulcer of left upper back, stage 2: Secondary | ICD-10-CM | POA: Diagnosis not present

## 2015-10-31 DIAGNOSIS — L89122 Pressure ulcer of left upper back, stage 2: Secondary | ICD-10-CM | POA: Diagnosis not present

## 2015-10-31 DIAGNOSIS — L89112 Pressure ulcer of right upper back, stage 2: Secondary | ICD-10-CM | POA: Diagnosis not present

## 2015-10-31 DIAGNOSIS — L89113 Pressure ulcer of right upper back, stage 3: Secondary | ICD-10-CM | POA: Diagnosis not present

## 2015-10-31 DIAGNOSIS — L89123 Pressure ulcer of left upper back, stage 3: Secondary | ICD-10-CM | POA: Diagnosis not present

## 2015-10-31 DIAGNOSIS — L989 Disorder of the skin and subcutaneous tissue, unspecified: Secondary | ICD-10-CM | POA: Diagnosis not present

## 2015-10-31 DIAGNOSIS — D7589 Other specified diseases of blood and blood-forming organs: Secondary | ICD-10-CM | POA: Diagnosis not present

## 2015-11-02 DIAGNOSIS — L89123 Pressure ulcer of left upper back, stage 3: Secondary | ICD-10-CM | POA: Diagnosis not present

## 2015-11-02 DIAGNOSIS — D7589 Other specified diseases of blood and blood-forming organs: Secondary | ICD-10-CM | POA: Diagnosis not present

## 2015-11-02 DIAGNOSIS — L89112 Pressure ulcer of right upper back, stage 2: Secondary | ICD-10-CM | POA: Diagnosis not present

## 2015-11-02 DIAGNOSIS — L89113 Pressure ulcer of right upper back, stage 3: Secondary | ICD-10-CM | POA: Diagnosis not present

## 2015-11-02 DIAGNOSIS — L89122 Pressure ulcer of left upper back, stage 2: Secondary | ICD-10-CM | POA: Diagnosis not present

## 2015-11-02 DIAGNOSIS — L989 Disorder of the skin and subcutaneous tissue, unspecified: Secondary | ICD-10-CM | POA: Diagnosis not present

## 2015-11-05 DIAGNOSIS — L89122 Pressure ulcer of left upper back, stage 2: Secondary | ICD-10-CM | POA: Diagnosis not present

## 2015-11-05 DIAGNOSIS — L89113 Pressure ulcer of right upper back, stage 3: Secondary | ICD-10-CM | POA: Diagnosis not present

## 2015-11-05 DIAGNOSIS — D7589 Other specified diseases of blood and blood-forming organs: Secondary | ICD-10-CM | POA: Diagnosis not present

## 2015-11-05 DIAGNOSIS — L89123 Pressure ulcer of left upper back, stage 3: Secondary | ICD-10-CM | POA: Diagnosis not present

## 2015-11-05 DIAGNOSIS — L89112 Pressure ulcer of right upper back, stage 2: Secondary | ICD-10-CM | POA: Diagnosis not present

## 2015-11-05 DIAGNOSIS — L989 Disorder of the skin and subcutaneous tissue, unspecified: Secondary | ICD-10-CM | POA: Diagnosis not present

## 2015-11-07 DIAGNOSIS — D7589 Other specified diseases of blood and blood-forming organs: Secondary | ICD-10-CM | POA: Diagnosis not present

## 2015-11-07 DIAGNOSIS — L89113 Pressure ulcer of right upper back, stage 3: Secondary | ICD-10-CM | POA: Diagnosis not present

## 2015-11-07 DIAGNOSIS — L89123 Pressure ulcer of left upper back, stage 3: Secondary | ICD-10-CM | POA: Diagnosis not present

## 2015-11-07 DIAGNOSIS — L89112 Pressure ulcer of right upper back, stage 2: Secondary | ICD-10-CM | POA: Diagnosis not present

## 2015-11-07 DIAGNOSIS — L989 Disorder of the skin and subcutaneous tissue, unspecified: Secondary | ICD-10-CM | POA: Diagnosis not present

## 2015-11-07 DIAGNOSIS — L89122 Pressure ulcer of left upper back, stage 2: Secondary | ICD-10-CM | POA: Diagnosis not present

## 2015-11-09 DIAGNOSIS — L989 Disorder of the skin and subcutaneous tissue, unspecified: Secondary | ICD-10-CM | POA: Diagnosis not present

## 2015-11-09 DIAGNOSIS — L89113 Pressure ulcer of right upper back, stage 3: Secondary | ICD-10-CM | POA: Diagnosis not present

## 2015-11-09 DIAGNOSIS — L89112 Pressure ulcer of right upper back, stage 2: Secondary | ICD-10-CM | POA: Diagnosis not present

## 2015-11-09 DIAGNOSIS — L89122 Pressure ulcer of left upper back, stage 2: Secondary | ICD-10-CM | POA: Diagnosis not present

## 2015-11-09 DIAGNOSIS — D7589 Other specified diseases of blood and blood-forming organs: Secondary | ICD-10-CM | POA: Diagnosis not present

## 2015-11-09 DIAGNOSIS — L89123 Pressure ulcer of left upper back, stage 3: Secondary | ICD-10-CM | POA: Diagnosis not present

## 2015-11-12 DIAGNOSIS — L989 Disorder of the skin and subcutaneous tissue, unspecified: Secondary | ICD-10-CM | POA: Diagnosis not present

## 2015-11-12 DIAGNOSIS — L89112 Pressure ulcer of right upper back, stage 2: Secondary | ICD-10-CM | POA: Diagnosis not present

## 2015-11-12 DIAGNOSIS — L89122 Pressure ulcer of left upper back, stage 2: Secondary | ICD-10-CM | POA: Diagnosis not present

## 2015-11-12 DIAGNOSIS — D7589 Other specified diseases of blood and blood-forming organs: Secondary | ICD-10-CM | POA: Diagnosis not present

## 2015-11-12 DIAGNOSIS — L89123 Pressure ulcer of left upper back, stage 3: Secondary | ICD-10-CM | POA: Diagnosis not present

## 2015-11-12 DIAGNOSIS — L89113 Pressure ulcer of right upper back, stage 3: Secondary | ICD-10-CM | POA: Diagnosis not present

## 2015-11-14 DIAGNOSIS — L89123 Pressure ulcer of left upper back, stage 3: Secondary | ICD-10-CM | POA: Diagnosis not present

## 2015-11-14 DIAGNOSIS — D7589 Other specified diseases of blood and blood-forming organs: Secondary | ICD-10-CM | POA: Diagnosis not present

## 2015-11-14 DIAGNOSIS — L89113 Pressure ulcer of right upper back, stage 3: Secondary | ICD-10-CM | POA: Diagnosis not present

## 2015-11-14 DIAGNOSIS — L89122 Pressure ulcer of left upper back, stage 2: Secondary | ICD-10-CM | POA: Diagnosis not present

## 2015-11-14 DIAGNOSIS — L89112 Pressure ulcer of right upper back, stage 2: Secondary | ICD-10-CM | POA: Diagnosis not present

## 2015-11-14 DIAGNOSIS — L989 Disorder of the skin and subcutaneous tissue, unspecified: Secondary | ICD-10-CM | POA: Diagnosis not present

## 2015-11-15 DIAGNOSIS — L89123 Pressure ulcer of left upper back, stage 3: Secondary | ICD-10-CM | POA: Diagnosis not present

## 2015-11-15 DIAGNOSIS — L989 Disorder of the skin and subcutaneous tissue, unspecified: Secondary | ICD-10-CM | POA: Diagnosis not present

## 2015-11-15 DIAGNOSIS — D7589 Other specified diseases of blood and blood-forming organs: Secondary | ICD-10-CM | POA: Diagnosis not present

## 2015-11-15 DIAGNOSIS — L89112 Pressure ulcer of right upper back, stage 2: Secondary | ICD-10-CM | POA: Diagnosis not present

## 2015-11-15 DIAGNOSIS — L89113 Pressure ulcer of right upper back, stage 3: Secondary | ICD-10-CM | POA: Diagnosis not present

## 2015-11-15 DIAGNOSIS — L89122 Pressure ulcer of left upper back, stage 2: Secondary | ICD-10-CM | POA: Diagnosis not present

## 2015-11-16 DIAGNOSIS — L89123 Pressure ulcer of left upper back, stage 3: Secondary | ICD-10-CM | POA: Diagnosis not present

## 2015-11-16 DIAGNOSIS — D7589 Other specified diseases of blood and blood-forming organs: Secondary | ICD-10-CM | POA: Diagnosis not present

## 2015-11-16 DIAGNOSIS — L989 Disorder of the skin and subcutaneous tissue, unspecified: Secondary | ICD-10-CM | POA: Diagnosis not present

## 2015-11-16 DIAGNOSIS — L89112 Pressure ulcer of right upper back, stage 2: Secondary | ICD-10-CM | POA: Diagnosis not present

## 2015-11-16 DIAGNOSIS — L89113 Pressure ulcer of right upper back, stage 3: Secondary | ICD-10-CM | POA: Diagnosis not present

## 2015-11-16 DIAGNOSIS — L89122 Pressure ulcer of left upper back, stage 2: Secondary | ICD-10-CM | POA: Diagnosis not present

## 2015-11-19 DIAGNOSIS — L89123 Pressure ulcer of left upper back, stage 3: Secondary | ICD-10-CM | POA: Diagnosis not present

## 2015-11-19 DIAGNOSIS — L989 Disorder of the skin and subcutaneous tissue, unspecified: Secondary | ICD-10-CM | POA: Diagnosis not present

## 2015-11-19 DIAGNOSIS — L89113 Pressure ulcer of right upper back, stage 3: Secondary | ICD-10-CM | POA: Diagnosis not present

## 2015-11-19 DIAGNOSIS — L89122 Pressure ulcer of left upper back, stage 2: Secondary | ICD-10-CM | POA: Diagnosis not present

## 2015-11-19 DIAGNOSIS — D7589 Other specified diseases of blood and blood-forming organs: Secondary | ICD-10-CM | POA: Diagnosis not present

## 2015-11-19 DIAGNOSIS — L89112 Pressure ulcer of right upper back, stage 2: Secondary | ICD-10-CM | POA: Diagnosis not present

## 2015-11-20 DIAGNOSIS — L89122 Pressure ulcer of left upper back, stage 2: Secondary | ICD-10-CM | POA: Diagnosis not present

## 2015-11-20 DIAGNOSIS — D7589 Other specified diseases of blood and blood-forming organs: Secondary | ICD-10-CM | POA: Diagnosis not present

## 2015-11-20 DIAGNOSIS — L989 Disorder of the skin and subcutaneous tissue, unspecified: Secondary | ICD-10-CM | POA: Diagnosis not present

## 2015-11-20 DIAGNOSIS — L89112 Pressure ulcer of right upper back, stage 2: Secondary | ICD-10-CM | POA: Diagnosis not present

## 2015-11-20 DIAGNOSIS — L89123 Pressure ulcer of left upper back, stage 3: Secondary | ICD-10-CM | POA: Diagnosis not present

## 2015-11-20 DIAGNOSIS — L89113 Pressure ulcer of right upper back, stage 3: Secondary | ICD-10-CM | POA: Diagnosis not present

## 2015-11-21 DIAGNOSIS — L89112 Pressure ulcer of right upper back, stage 2: Secondary | ICD-10-CM | POA: Diagnosis not present

## 2015-11-21 DIAGNOSIS — L89122 Pressure ulcer of left upper back, stage 2: Secondary | ICD-10-CM | POA: Diagnosis not present

## 2015-11-21 DIAGNOSIS — L989 Disorder of the skin and subcutaneous tissue, unspecified: Secondary | ICD-10-CM | POA: Diagnosis not present

## 2015-11-21 DIAGNOSIS — L89123 Pressure ulcer of left upper back, stage 3: Secondary | ICD-10-CM | POA: Diagnosis not present

## 2015-11-21 DIAGNOSIS — L89113 Pressure ulcer of right upper back, stage 3: Secondary | ICD-10-CM | POA: Diagnosis not present

## 2015-11-21 DIAGNOSIS — D7589 Other specified diseases of blood and blood-forming organs: Secondary | ICD-10-CM | POA: Diagnosis not present

## 2015-11-26 DIAGNOSIS — L89122 Pressure ulcer of left upper back, stage 2: Secondary | ICD-10-CM | POA: Diagnosis not present

## 2015-11-26 DIAGNOSIS — L89112 Pressure ulcer of right upper back, stage 2: Secondary | ICD-10-CM | POA: Diagnosis not present

## 2015-11-26 DIAGNOSIS — L89123 Pressure ulcer of left upper back, stage 3: Secondary | ICD-10-CM | POA: Diagnosis not present

## 2015-11-26 DIAGNOSIS — L989 Disorder of the skin and subcutaneous tissue, unspecified: Secondary | ICD-10-CM | POA: Diagnosis not present

## 2015-11-26 DIAGNOSIS — L89113 Pressure ulcer of right upper back, stage 3: Secondary | ICD-10-CM | POA: Diagnosis not present

## 2015-11-26 DIAGNOSIS — D7589 Other specified diseases of blood and blood-forming organs: Secondary | ICD-10-CM | POA: Diagnosis not present

## 2015-11-27 DIAGNOSIS — L89123 Pressure ulcer of left upper back, stage 3: Secondary | ICD-10-CM | POA: Diagnosis not present

## 2015-11-27 DIAGNOSIS — L89113 Pressure ulcer of right upper back, stage 3: Secondary | ICD-10-CM | POA: Diagnosis not present

## 2015-11-27 DIAGNOSIS — L89122 Pressure ulcer of left upper back, stage 2: Secondary | ICD-10-CM | POA: Diagnosis not present

## 2015-11-27 DIAGNOSIS — L89112 Pressure ulcer of right upper back, stage 2: Secondary | ICD-10-CM | POA: Diagnosis not present

## 2015-11-27 DIAGNOSIS — L989 Disorder of the skin and subcutaneous tissue, unspecified: Secondary | ICD-10-CM | POA: Diagnosis not present

## 2015-11-27 DIAGNOSIS — D7589 Other specified diseases of blood and blood-forming organs: Secondary | ICD-10-CM | POA: Diagnosis not present

## 2015-11-28 DIAGNOSIS — D7589 Other specified diseases of blood and blood-forming organs: Secondary | ICD-10-CM | POA: Diagnosis not present

## 2015-11-28 DIAGNOSIS — L89122 Pressure ulcer of left upper back, stage 2: Secondary | ICD-10-CM | POA: Diagnosis not present

## 2015-11-28 DIAGNOSIS — L989 Disorder of the skin and subcutaneous tissue, unspecified: Secondary | ICD-10-CM | POA: Diagnosis not present

## 2015-11-28 DIAGNOSIS — L89112 Pressure ulcer of right upper back, stage 2: Secondary | ICD-10-CM | POA: Diagnosis not present

## 2015-11-28 DIAGNOSIS — L89123 Pressure ulcer of left upper back, stage 3: Secondary | ICD-10-CM | POA: Diagnosis not present

## 2015-11-28 DIAGNOSIS — L89113 Pressure ulcer of right upper back, stage 3: Secondary | ICD-10-CM | POA: Diagnosis not present

## 2015-11-30 DIAGNOSIS — D7589 Other specified diseases of blood and blood-forming organs: Secondary | ICD-10-CM | POA: Diagnosis not present

## 2015-11-30 DIAGNOSIS — L89122 Pressure ulcer of left upper back, stage 2: Secondary | ICD-10-CM | POA: Diagnosis not present

## 2015-11-30 DIAGNOSIS — L89123 Pressure ulcer of left upper back, stage 3: Secondary | ICD-10-CM | POA: Diagnosis not present

## 2015-11-30 DIAGNOSIS — L989 Disorder of the skin and subcutaneous tissue, unspecified: Secondary | ICD-10-CM | POA: Diagnosis not present

## 2015-11-30 DIAGNOSIS — L89112 Pressure ulcer of right upper back, stage 2: Secondary | ICD-10-CM | POA: Diagnosis not present

## 2015-11-30 DIAGNOSIS — L89113 Pressure ulcer of right upper back, stage 3: Secondary | ICD-10-CM | POA: Diagnosis not present

## 2015-12-04 DIAGNOSIS — D7589 Other specified diseases of blood and blood-forming organs: Secondary | ICD-10-CM | POA: Diagnosis not present

## 2015-12-04 DIAGNOSIS — L89123 Pressure ulcer of left upper back, stage 3: Secondary | ICD-10-CM | POA: Diagnosis not present

## 2015-12-04 DIAGNOSIS — L89112 Pressure ulcer of right upper back, stage 2: Secondary | ICD-10-CM | POA: Diagnosis not present

## 2015-12-04 DIAGNOSIS — L89122 Pressure ulcer of left upper back, stage 2: Secondary | ICD-10-CM | POA: Diagnosis not present

## 2015-12-04 DIAGNOSIS — L989 Disorder of the skin and subcutaneous tissue, unspecified: Secondary | ICD-10-CM | POA: Diagnosis not present

## 2015-12-04 DIAGNOSIS — L89113 Pressure ulcer of right upper back, stage 3: Secondary | ICD-10-CM | POA: Diagnosis not present

## 2015-12-06 DIAGNOSIS — L89113 Pressure ulcer of right upper back, stage 3: Secondary | ICD-10-CM | POA: Diagnosis not present

## 2015-12-06 DIAGNOSIS — L89123 Pressure ulcer of left upper back, stage 3: Secondary | ICD-10-CM | POA: Diagnosis not present

## 2015-12-06 DIAGNOSIS — L89112 Pressure ulcer of right upper back, stage 2: Secondary | ICD-10-CM | POA: Diagnosis not present

## 2015-12-06 DIAGNOSIS — L989 Disorder of the skin and subcutaneous tissue, unspecified: Secondary | ICD-10-CM | POA: Diagnosis not present

## 2015-12-06 DIAGNOSIS — D7589 Other specified diseases of blood and blood-forming organs: Secondary | ICD-10-CM | POA: Diagnosis not present

## 2015-12-06 DIAGNOSIS — L89122 Pressure ulcer of left upper back, stage 2: Secondary | ICD-10-CM | POA: Diagnosis not present

## 2015-12-07 DIAGNOSIS — I4510 Unspecified right bundle-branch block: Secondary | ICD-10-CM | POA: Diagnosis not present

## 2015-12-07 DIAGNOSIS — L89123 Pressure ulcer of left upper back, stage 3: Secondary | ICD-10-CM | POA: Diagnosis not present

## 2015-12-07 DIAGNOSIS — I25110 Atherosclerotic heart disease of native coronary artery with unstable angina pectoris: Secondary | ICD-10-CM | POA: Diagnosis not present

## 2015-12-07 DIAGNOSIS — Z85038 Personal history of other malignant neoplasm of large intestine: Secondary | ICD-10-CM | POA: Diagnosis not present

## 2015-12-07 DIAGNOSIS — I10 Essential (primary) hypertension: Secondary | ICD-10-CM | POA: Diagnosis not present

## 2015-12-07 DIAGNOSIS — D7589 Other specified diseases of blood and blood-forming organs: Secondary | ICD-10-CM | POA: Diagnosis not present

## 2015-12-07 DIAGNOSIS — Z8546 Personal history of malignant neoplasm of prostate: Secondary | ICD-10-CM | POA: Diagnosis not present

## 2015-12-07 DIAGNOSIS — Z4800 Encounter for change or removal of nonsurgical wound dressing: Secondary | ICD-10-CM | POA: Diagnosis not present

## 2015-12-07 DIAGNOSIS — Z7401 Bed confinement status: Secondary | ICD-10-CM | POA: Diagnosis not present

## 2015-12-07 DIAGNOSIS — N400 Enlarged prostate without lower urinary tract symptoms: Secondary | ICD-10-CM | POA: Diagnosis not present

## 2015-12-07 DIAGNOSIS — D649 Anemia, unspecified: Secondary | ICD-10-CM | POA: Diagnosis not present

## 2015-12-07 DIAGNOSIS — E119 Type 2 diabetes mellitus without complications: Secondary | ICD-10-CM | POA: Diagnosis not present

## 2015-12-08 DIAGNOSIS — E119 Type 2 diabetes mellitus without complications: Secondary | ICD-10-CM | POA: Diagnosis not present

## 2015-12-08 DIAGNOSIS — L89123 Pressure ulcer of left upper back, stage 3: Secondary | ICD-10-CM | POA: Diagnosis not present

## 2015-12-08 DIAGNOSIS — I25110 Atherosclerotic heart disease of native coronary artery with unstable angina pectoris: Secondary | ICD-10-CM | POA: Diagnosis not present

## 2015-12-08 DIAGNOSIS — D7589 Other specified diseases of blood and blood-forming organs: Secondary | ICD-10-CM | POA: Diagnosis not present

## 2015-12-10 DIAGNOSIS — I10 Essential (primary) hypertension: Secondary | ICD-10-CM | POA: Diagnosis not present

## 2015-12-10 DIAGNOSIS — I25110 Atherosclerotic heart disease of native coronary artery with unstable angina pectoris: Secondary | ICD-10-CM | POA: Diagnosis not present

## 2015-12-10 DIAGNOSIS — D7589 Other specified diseases of blood and blood-forming organs: Secondary | ICD-10-CM | POA: Diagnosis not present

## 2015-12-10 DIAGNOSIS — E119 Type 2 diabetes mellitus without complications: Secondary | ICD-10-CM | POA: Diagnosis not present

## 2015-12-10 DIAGNOSIS — L89123 Pressure ulcer of left upper back, stage 3: Secondary | ICD-10-CM | POA: Diagnosis not present

## 2015-12-10 DIAGNOSIS — I4510 Unspecified right bundle-branch block: Secondary | ICD-10-CM | POA: Diagnosis not present

## 2015-12-12 DIAGNOSIS — D7589 Other specified diseases of blood and blood-forming organs: Secondary | ICD-10-CM | POA: Diagnosis not present

## 2015-12-12 DIAGNOSIS — E119 Type 2 diabetes mellitus without complications: Secondary | ICD-10-CM | POA: Diagnosis not present

## 2015-12-12 DIAGNOSIS — I25110 Atherosclerotic heart disease of native coronary artery with unstable angina pectoris: Secondary | ICD-10-CM | POA: Diagnosis not present

## 2015-12-12 DIAGNOSIS — L89123 Pressure ulcer of left upper back, stage 3: Secondary | ICD-10-CM | POA: Diagnosis not present

## 2015-12-12 DIAGNOSIS — I10 Essential (primary) hypertension: Secondary | ICD-10-CM | POA: Diagnosis not present

## 2015-12-12 DIAGNOSIS — I4510 Unspecified right bundle-branch block: Secondary | ICD-10-CM | POA: Diagnosis not present

## 2015-12-14 DIAGNOSIS — I25110 Atherosclerotic heart disease of native coronary artery with unstable angina pectoris: Secondary | ICD-10-CM | POA: Diagnosis not present

## 2015-12-14 DIAGNOSIS — I10 Essential (primary) hypertension: Secondary | ICD-10-CM | POA: Diagnosis not present

## 2015-12-14 DIAGNOSIS — L89123 Pressure ulcer of left upper back, stage 3: Secondary | ICD-10-CM | POA: Diagnosis not present

## 2015-12-14 DIAGNOSIS — E119 Type 2 diabetes mellitus without complications: Secondary | ICD-10-CM | POA: Diagnosis not present

## 2015-12-14 DIAGNOSIS — I4510 Unspecified right bundle-branch block: Secondary | ICD-10-CM | POA: Diagnosis not present

## 2015-12-14 DIAGNOSIS — D7589 Other specified diseases of blood and blood-forming organs: Secondary | ICD-10-CM | POA: Diagnosis not present

## 2015-12-17 DIAGNOSIS — I4510 Unspecified right bundle-branch block: Secondary | ICD-10-CM | POA: Diagnosis not present

## 2015-12-17 DIAGNOSIS — D7589 Other specified diseases of blood and blood-forming organs: Secondary | ICD-10-CM | POA: Diagnosis not present

## 2015-12-17 DIAGNOSIS — I25110 Atherosclerotic heart disease of native coronary artery with unstable angina pectoris: Secondary | ICD-10-CM | POA: Diagnosis not present

## 2015-12-17 DIAGNOSIS — L89123 Pressure ulcer of left upper back, stage 3: Secondary | ICD-10-CM | POA: Diagnosis not present

## 2015-12-17 DIAGNOSIS — E119 Type 2 diabetes mellitus without complications: Secondary | ICD-10-CM | POA: Diagnosis not present

## 2015-12-17 DIAGNOSIS — I10 Essential (primary) hypertension: Secondary | ICD-10-CM | POA: Diagnosis not present

## 2015-12-19 DIAGNOSIS — E119 Type 2 diabetes mellitus without complications: Secondary | ICD-10-CM | POA: Diagnosis not present

## 2015-12-19 DIAGNOSIS — I10 Essential (primary) hypertension: Secondary | ICD-10-CM | POA: Diagnosis not present

## 2015-12-19 DIAGNOSIS — I4510 Unspecified right bundle-branch block: Secondary | ICD-10-CM | POA: Diagnosis not present

## 2015-12-19 DIAGNOSIS — L89123 Pressure ulcer of left upper back, stage 3: Secondary | ICD-10-CM | POA: Diagnosis not present

## 2015-12-19 DIAGNOSIS — I25110 Atherosclerotic heart disease of native coronary artery with unstable angina pectoris: Secondary | ICD-10-CM | POA: Diagnosis not present

## 2015-12-19 DIAGNOSIS — D7589 Other specified diseases of blood and blood-forming organs: Secondary | ICD-10-CM | POA: Diagnosis not present

## 2015-12-21 DIAGNOSIS — I10 Essential (primary) hypertension: Secondary | ICD-10-CM | POA: Diagnosis not present

## 2015-12-21 DIAGNOSIS — L89123 Pressure ulcer of left upper back, stage 3: Secondary | ICD-10-CM | POA: Diagnosis not present

## 2015-12-21 DIAGNOSIS — I4510 Unspecified right bundle-branch block: Secondary | ICD-10-CM | POA: Diagnosis not present

## 2015-12-21 DIAGNOSIS — D7589 Other specified diseases of blood and blood-forming organs: Secondary | ICD-10-CM | POA: Diagnosis not present

## 2015-12-21 DIAGNOSIS — E119 Type 2 diabetes mellitus without complications: Secondary | ICD-10-CM | POA: Diagnosis not present

## 2015-12-21 DIAGNOSIS — I25110 Atherosclerotic heart disease of native coronary artery with unstable angina pectoris: Secondary | ICD-10-CM | POA: Diagnosis not present

## 2015-12-24 DIAGNOSIS — L89123 Pressure ulcer of left upper back, stage 3: Secondary | ICD-10-CM | POA: Diagnosis not present

## 2015-12-24 DIAGNOSIS — E119 Type 2 diabetes mellitus without complications: Secondary | ICD-10-CM | POA: Diagnosis not present

## 2015-12-24 DIAGNOSIS — I4510 Unspecified right bundle-branch block: Secondary | ICD-10-CM | POA: Diagnosis not present

## 2015-12-24 DIAGNOSIS — I10 Essential (primary) hypertension: Secondary | ICD-10-CM | POA: Diagnosis not present

## 2015-12-24 DIAGNOSIS — I25110 Atherosclerotic heart disease of native coronary artery with unstable angina pectoris: Secondary | ICD-10-CM | POA: Diagnosis not present

## 2015-12-24 DIAGNOSIS — D7589 Other specified diseases of blood and blood-forming organs: Secondary | ICD-10-CM | POA: Diagnosis not present

## 2015-12-25 DIAGNOSIS — E119 Type 2 diabetes mellitus without complications: Secondary | ICD-10-CM | POA: Diagnosis not present

## 2015-12-25 DIAGNOSIS — I4510 Unspecified right bundle-branch block: Secondary | ICD-10-CM | POA: Diagnosis not present

## 2015-12-25 DIAGNOSIS — D7589 Other specified diseases of blood and blood-forming organs: Secondary | ICD-10-CM | POA: Diagnosis not present

## 2015-12-25 DIAGNOSIS — I10 Essential (primary) hypertension: Secondary | ICD-10-CM | POA: Diagnosis not present

## 2015-12-25 DIAGNOSIS — I25110 Atherosclerotic heart disease of native coronary artery with unstable angina pectoris: Secondary | ICD-10-CM | POA: Diagnosis not present

## 2015-12-25 DIAGNOSIS — L89123 Pressure ulcer of left upper back, stage 3: Secondary | ICD-10-CM | POA: Diagnosis not present

## 2015-12-26 DIAGNOSIS — L89123 Pressure ulcer of left upper back, stage 3: Secondary | ICD-10-CM | POA: Diagnosis not present

## 2015-12-26 DIAGNOSIS — I10 Essential (primary) hypertension: Secondary | ICD-10-CM | POA: Diagnosis not present

## 2015-12-26 DIAGNOSIS — I4510 Unspecified right bundle-branch block: Secondary | ICD-10-CM | POA: Diagnosis not present

## 2015-12-26 DIAGNOSIS — D7589 Other specified diseases of blood and blood-forming organs: Secondary | ICD-10-CM | POA: Diagnosis not present

## 2015-12-26 DIAGNOSIS — E119 Type 2 diabetes mellitus without complications: Secondary | ICD-10-CM | POA: Diagnosis not present

## 2015-12-26 DIAGNOSIS — I25110 Atherosclerotic heart disease of native coronary artery with unstable angina pectoris: Secondary | ICD-10-CM | POA: Diagnosis not present

## 2015-12-28 DIAGNOSIS — E119 Type 2 diabetes mellitus without complications: Secondary | ICD-10-CM | POA: Diagnosis not present

## 2015-12-28 DIAGNOSIS — I10 Essential (primary) hypertension: Secondary | ICD-10-CM | POA: Diagnosis not present

## 2015-12-28 DIAGNOSIS — I4510 Unspecified right bundle-branch block: Secondary | ICD-10-CM | POA: Diagnosis not present

## 2015-12-28 DIAGNOSIS — L89123 Pressure ulcer of left upper back, stage 3: Secondary | ICD-10-CM | POA: Diagnosis not present

## 2015-12-28 DIAGNOSIS — I25110 Atherosclerotic heart disease of native coronary artery with unstable angina pectoris: Secondary | ICD-10-CM | POA: Diagnosis not present

## 2015-12-28 DIAGNOSIS — D7589 Other specified diseases of blood and blood-forming organs: Secondary | ICD-10-CM | POA: Diagnosis not present

## 2015-12-31 DIAGNOSIS — I4510 Unspecified right bundle-branch block: Secondary | ICD-10-CM | POA: Diagnosis not present

## 2015-12-31 DIAGNOSIS — E119 Type 2 diabetes mellitus without complications: Secondary | ICD-10-CM | POA: Diagnosis not present

## 2015-12-31 DIAGNOSIS — I25110 Atherosclerotic heart disease of native coronary artery with unstable angina pectoris: Secondary | ICD-10-CM | POA: Diagnosis not present

## 2015-12-31 DIAGNOSIS — I10 Essential (primary) hypertension: Secondary | ICD-10-CM | POA: Diagnosis not present

## 2015-12-31 DIAGNOSIS — L89123 Pressure ulcer of left upper back, stage 3: Secondary | ICD-10-CM | POA: Diagnosis not present

## 2015-12-31 DIAGNOSIS — D7589 Other specified diseases of blood and blood-forming organs: Secondary | ICD-10-CM | POA: Diagnosis not present

## 2016-01-02 DIAGNOSIS — E119 Type 2 diabetes mellitus without complications: Secondary | ICD-10-CM | POA: Diagnosis not present

## 2016-01-02 DIAGNOSIS — D7589 Other specified diseases of blood and blood-forming organs: Secondary | ICD-10-CM | POA: Diagnosis not present

## 2016-01-02 DIAGNOSIS — I10 Essential (primary) hypertension: Secondary | ICD-10-CM | POA: Diagnosis not present

## 2016-01-02 DIAGNOSIS — I4510 Unspecified right bundle-branch block: Secondary | ICD-10-CM | POA: Diagnosis not present

## 2016-01-02 DIAGNOSIS — L89123 Pressure ulcer of left upper back, stage 3: Secondary | ICD-10-CM | POA: Diagnosis not present

## 2016-01-02 DIAGNOSIS — I25110 Atherosclerotic heart disease of native coronary artery with unstable angina pectoris: Secondary | ICD-10-CM | POA: Diagnosis not present

## 2016-01-04 DIAGNOSIS — I4510 Unspecified right bundle-branch block: Secondary | ICD-10-CM | POA: Diagnosis not present

## 2016-01-04 DIAGNOSIS — L89123 Pressure ulcer of left upper back, stage 3: Secondary | ICD-10-CM | POA: Diagnosis not present

## 2016-01-04 DIAGNOSIS — E119 Type 2 diabetes mellitus without complications: Secondary | ICD-10-CM | POA: Diagnosis not present

## 2016-01-04 DIAGNOSIS — D7589 Other specified diseases of blood and blood-forming organs: Secondary | ICD-10-CM | POA: Diagnosis not present

## 2016-01-04 DIAGNOSIS — I25110 Atherosclerotic heart disease of native coronary artery with unstable angina pectoris: Secondary | ICD-10-CM | POA: Diagnosis not present

## 2016-01-04 DIAGNOSIS — I10 Essential (primary) hypertension: Secondary | ICD-10-CM | POA: Diagnosis not present

## 2016-01-07 DIAGNOSIS — I10 Essential (primary) hypertension: Secondary | ICD-10-CM | POA: Diagnosis not present

## 2016-01-07 DIAGNOSIS — D7589 Other specified diseases of blood and blood-forming organs: Secondary | ICD-10-CM | POA: Diagnosis not present

## 2016-01-07 DIAGNOSIS — I25110 Atherosclerotic heart disease of native coronary artery with unstable angina pectoris: Secondary | ICD-10-CM | POA: Diagnosis not present

## 2016-01-07 DIAGNOSIS — L89123 Pressure ulcer of left upper back, stage 3: Secondary | ICD-10-CM | POA: Diagnosis not present

## 2016-01-07 DIAGNOSIS — I4510 Unspecified right bundle-branch block: Secondary | ICD-10-CM | POA: Diagnosis not present

## 2016-01-07 DIAGNOSIS — E119 Type 2 diabetes mellitus without complications: Secondary | ICD-10-CM | POA: Diagnosis not present

## 2016-01-09 DIAGNOSIS — L89123 Pressure ulcer of left upper back, stage 3: Secondary | ICD-10-CM | POA: Diagnosis not present

## 2016-01-09 DIAGNOSIS — I4510 Unspecified right bundle-branch block: Secondary | ICD-10-CM | POA: Diagnosis not present

## 2016-01-09 DIAGNOSIS — D7589 Other specified diseases of blood and blood-forming organs: Secondary | ICD-10-CM | POA: Diagnosis not present

## 2016-01-09 DIAGNOSIS — I10 Essential (primary) hypertension: Secondary | ICD-10-CM | POA: Diagnosis not present

## 2016-01-09 DIAGNOSIS — E119 Type 2 diabetes mellitus without complications: Secondary | ICD-10-CM | POA: Diagnosis not present

## 2016-01-09 DIAGNOSIS — I25110 Atherosclerotic heart disease of native coronary artery with unstable angina pectoris: Secondary | ICD-10-CM | POA: Diagnosis not present

## 2016-01-11 DIAGNOSIS — E119 Type 2 diabetes mellitus without complications: Secondary | ICD-10-CM | POA: Diagnosis not present

## 2016-01-11 DIAGNOSIS — D7589 Other specified diseases of blood and blood-forming organs: Secondary | ICD-10-CM | POA: Diagnosis not present

## 2016-01-11 DIAGNOSIS — I25110 Atherosclerotic heart disease of native coronary artery with unstable angina pectoris: Secondary | ICD-10-CM | POA: Diagnosis not present

## 2016-01-11 DIAGNOSIS — I4510 Unspecified right bundle-branch block: Secondary | ICD-10-CM | POA: Diagnosis not present

## 2016-01-11 DIAGNOSIS — I10 Essential (primary) hypertension: Secondary | ICD-10-CM | POA: Diagnosis not present

## 2016-01-11 DIAGNOSIS — L89123 Pressure ulcer of left upper back, stage 3: Secondary | ICD-10-CM | POA: Diagnosis not present

## 2016-01-14 DIAGNOSIS — I10 Essential (primary) hypertension: Secondary | ICD-10-CM | POA: Diagnosis not present

## 2016-01-14 DIAGNOSIS — E119 Type 2 diabetes mellitus without complications: Secondary | ICD-10-CM | POA: Diagnosis not present

## 2016-01-14 DIAGNOSIS — I4510 Unspecified right bundle-branch block: Secondary | ICD-10-CM | POA: Diagnosis not present

## 2016-01-14 DIAGNOSIS — L89123 Pressure ulcer of left upper back, stage 3: Secondary | ICD-10-CM | POA: Diagnosis not present

## 2016-01-14 DIAGNOSIS — D7589 Other specified diseases of blood and blood-forming organs: Secondary | ICD-10-CM | POA: Diagnosis not present

## 2016-01-14 DIAGNOSIS — I25110 Atherosclerotic heart disease of native coronary artery with unstable angina pectoris: Secondary | ICD-10-CM | POA: Diagnosis not present

## 2016-01-16 DIAGNOSIS — I4510 Unspecified right bundle-branch block: Secondary | ICD-10-CM | POA: Diagnosis not present

## 2016-01-16 DIAGNOSIS — I25110 Atherosclerotic heart disease of native coronary artery with unstable angina pectoris: Secondary | ICD-10-CM | POA: Diagnosis not present

## 2016-01-16 DIAGNOSIS — L89123 Pressure ulcer of left upper back, stage 3: Secondary | ICD-10-CM | POA: Diagnosis not present

## 2016-01-16 DIAGNOSIS — I10 Essential (primary) hypertension: Secondary | ICD-10-CM | POA: Diagnosis not present

## 2016-01-16 DIAGNOSIS — D7589 Other specified diseases of blood and blood-forming organs: Secondary | ICD-10-CM | POA: Diagnosis not present

## 2016-01-16 DIAGNOSIS — E119 Type 2 diabetes mellitus without complications: Secondary | ICD-10-CM | POA: Diagnosis not present

## 2016-01-18 DIAGNOSIS — I10 Essential (primary) hypertension: Secondary | ICD-10-CM | POA: Diagnosis not present

## 2016-01-18 DIAGNOSIS — E119 Type 2 diabetes mellitus without complications: Secondary | ICD-10-CM | POA: Diagnosis not present

## 2016-01-18 DIAGNOSIS — I4510 Unspecified right bundle-branch block: Secondary | ICD-10-CM | POA: Diagnosis not present

## 2016-01-18 DIAGNOSIS — L89123 Pressure ulcer of left upper back, stage 3: Secondary | ICD-10-CM | POA: Diagnosis not present

## 2016-01-18 DIAGNOSIS — I25110 Atherosclerotic heart disease of native coronary artery with unstable angina pectoris: Secondary | ICD-10-CM | POA: Diagnosis not present

## 2016-01-18 DIAGNOSIS — D7589 Other specified diseases of blood and blood-forming organs: Secondary | ICD-10-CM | POA: Diagnosis not present

## 2016-01-21 DIAGNOSIS — I4510 Unspecified right bundle-branch block: Secondary | ICD-10-CM | POA: Diagnosis not present

## 2016-01-21 DIAGNOSIS — I10 Essential (primary) hypertension: Secondary | ICD-10-CM | POA: Diagnosis not present

## 2016-01-21 DIAGNOSIS — L89123 Pressure ulcer of left upper back, stage 3: Secondary | ICD-10-CM | POA: Diagnosis not present

## 2016-01-21 DIAGNOSIS — E119 Type 2 diabetes mellitus without complications: Secondary | ICD-10-CM | POA: Diagnosis not present

## 2016-01-21 DIAGNOSIS — I25110 Atherosclerotic heart disease of native coronary artery with unstable angina pectoris: Secondary | ICD-10-CM | POA: Diagnosis not present

## 2016-01-21 DIAGNOSIS — D7589 Other specified diseases of blood and blood-forming organs: Secondary | ICD-10-CM | POA: Diagnosis not present

## 2016-01-22 DIAGNOSIS — L089 Local infection of the skin and subcutaneous tissue, unspecified: Secondary | ICD-10-CM | POA: Diagnosis not present

## 2016-01-22 DIAGNOSIS — D485 Neoplasm of uncertain behavior of skin: Secondary | ICD-10-CM | POA: Diagnosis not present

## 2016-01-23 DIAGNOSIS — I25110 Atherosclerotic heart disease of native coronary artery with unstable angina pectoris: Secondary | ICD-10-CM | POA: Diagnosis not present

## 2016-01-23 DIAGNOSIS — E119 Type 2 diabetes mellitus without complications: Secondary | ICD-10-CM | POA: Diagnosis not present

## 2016-01-23 DIAGNOSIS — D7589 Other specified diseases of blood and blood-forming organs: Secondary | ICD-10-CM | POA: Diagnosis not present

## 2016-01-23 DIAGNOSIS — I10 Essential (primary) hypertension: Secondary | ICD-10-CM | POA: Diagnosis not present

## 2016-01-23 DIAGNOSIS — I4510 Unspecified right bundle-branch block: Secondary | ICD-10-CM | POA: Diagnosis not present

## 2016-01-23 DIAGNOSIS — L89123 Pressure ulcer of left upper back, stage 3: Secondary | ICD-10-CM | POA: Diagnosis not present

## 2016-01-24 DIAGNOSIS — L570 Actinic keratosis: Secondary | ICD-10-CM | POA: Diagnosis not present

## 2016-01-24 DIAGNOSIS — D044 Carcinoma in situ of skin of scalp and neck: Secondary | ICD-10-CM | POA: Diagnosis not present

## 2016-01-24 DIAGNOSIS — D0439 Carcinoma in situ of skin of other parts of face: Secondary | ICD-10-CM | POA: Diagnosis not present

## 2016-01-24 DIAGNOSIS — C44329 Squamous cell carcinoma of skin of other parts of face: Secondary | ICD-10-CM | POA: Diagnosis not present

## 2016-01-25 DIAGNOSIS — I10 Essential (primary) hypertension: Secondary | ICD-10-CM | POA: Diagnosis not present

## 2016-01-25 DIAGNOSIS — D7589 Other specified diseases of blood and blood-forming organs: Secondary | ICD-10-CM | POA: Diagnosis not present

## 2016-01-25 DIAGNOSIS — I4510 Unspecified right bundle-branch block: Secondary | ICD-10-CM | POA: Diagnosis not present

## 2016-01-25 DIAGNOSIS — E119 Type 2 diabetes mellitus without complications: Secondary | ICD-10-CM | POA: Diagnosis not present

## 2016-01-25 DIAGNOSIS — L89123 Pressure ulcer of left upper back, stage 3: Secondary | ICD-10-CM | POA: Diagnosis not present

## 2016-01-25 DIAGNOSIS — I25110 Atherosclerotic heart disease of native coronary artery with unstable angina pectoris: Secondary | ICD-10-CM | POA: Diagnosis not present

## 2016-01-28 DIAGNOSIS — I10 Essential (primary) hypertension: Secondary | ICD-10-CM | POA: Diagnosis not present

## 2016-01-28 DIAGNOSIS — I4510 Unspecified right bundle-branch block: Secondary | ICD-10-CM | POA: Diagnosis not present

## 2016-01-28 DIAGNOSIS — D7589 Other specified diseases of blood and blood-forming organs: Secondary | ICD-10-CM | POA: Diagnosis not present

## 2016-01-28 DIAGNOSIS — L89123 Pressure ulcer of left upper back, stage 3: Secondary | ICD-10-CM | POA: Diagnosis not present

## 2016-01-28 DIAGNOSIS — I25110 Atherosclerotic heart disease of native coronary artery with unstable angina pectoris: Secondary | ICD-10-CM | POA: Diagnosis not present

## 2016-01-28 DIAGNOSIS — E119 Type 2 diabetes mellitus without complications: Secondary | ICD-10-CM | POA: Diagnosis not present

## 2016-01-30 DIAGNOSIS — I4510 Unspecified right bundle-branch block: Secondary | ICD-10-CM | POA: Diagnosis not present

## 2016-01-30 DIAGNOSIS — I10 Essential (primary) hypertension: Secondary | ICD-10-CM | POA: Diagnosis not present

## 2016-01-30 DIAGNOSIS — D7589 Other specified diseases of blood and blood-forming organs: Secondary | ICD-10-CM | POA: Diagnosis not present

## 2016-01-30 DIAGNOSIS — E119 Type 2 diabetes mellitus without complications: Secondary | ICD-10-CM | POA: Diagnosis not present

## 2016-01-30 DIAGNOSIS — I25110 Atherosclerotic heart disease of native coronary artery with unstable angina pectoris: Secondary | ICD-10-CM | POA: Diagnosis not present

## 2016-01-30 DIAGNOSIS — L89123 Pressure ulcer of left upper back, stage 3: Secondary | ICD-10-CM | POA: Diagnosis not present

## 2016-02-01 DIAGNOSIS — I4510 Unspecified right bundle-branch block: Secondary | ICD-10-CM | POA: Diagnosis not present

## 2016-02-01 DIAGNOSIS — I25110 Atherosclerotic heart disease of native coronary artery with unstable angina pectoris: Secondary | ICD-10-CM | POA: Diagnosis not present

## 2016-02-01 DIAGNOSIS — D7589 Other specified diseases of blood and blood-forming organs: Secondary | ICD-10-CM | POA: Diagnosis not present

## 2016-02-01 DIAGNOSIS — L89123 Pressure ulcer of left upper back, stage 3: Secondary | ICD-10-CM | POA: Diagnosis not present

## 2016-02-01 DIAGNOSIS — I10 Essential (primary) hypertension: Secondary | ICD-10-CM | POA: Diagnosis not present

## 2016-02-01 DIAGNOSIS — E119 Type 2 diabetes mellitus without complications: Secondary | ICD-10-CM | POA: Diagnosis not present

## 2016-02-05 DIAGNOSIS — Z7401 Bed confinement status: Secondary | ICD-10-CM | POA: Diagnosis not present

## 2016-02-05 DIAGNOSIS — L989 Disorder of the skin and subcutaneous tissue, unspecified: Secondary | ICD-10-CM | POA: Diagnosis not present

## 2016-02-05 DIAGNOSIS — I4510 Unspecified right bundle-branch block: Secondary | ICD-10-CM | POA: Diagnosis not present

## 2016-02-05 DIAGNOSIS — I10 Essential (primary) hypertension: Secondary | ICD-10-CM | POA: Diagnosis not present

## 2016-02-05 DIAGNOSIS — D649 Anemia, unspecified: Secondary | ICD-10-CM | POA: Diagnosis not present

## 2016-02-05 DIAGNOSIS — N400 Enlarged prostate without lower urinary tract symptoms: Secondary | ICD-10-CM | POA: Diagnosis not present

## 2016-02-05 DIAGNOSIS — Z4800 Encounter for change or removal of nonsurgical wound dressing: Secondary | ICD-10-CM | POA: Diagnosis not present

## 2016-02-05 DIAGNOSIS — Z85038 Personal history of other malignant neoplasm of large intestine: Secondary | ICD-10-CM | POA: Diagnosis not present

## 2016-02-05 DIAGNOSIS — I25110 Atherosclerotic heart disease of native coronary artery with unstable angina pectoris: Secondary | ICD-10-CM | POA: Diagnosis not present

## 2016-02-05 DIAGNOSIS — Z8546 Personal history of malignant neoplasm of prostate: Secondary | ICD-10-CM | POA: Diagnosis not present

## 2016-02-05 DIAGNOSIS — D7589 Other specified diseases of blood and blood-forming organs: Secondary | ICD-10-CM | POA: Diagnosis not present

## 2016-02-05 DIAGNOSIS — E119 Type 2 diabetes mellitus without complications: Secondary | ICD-10-CM | POA: Diagnosis not present

## 2016-02-07 DIAGNOSIS — D485 Neoplasm of uncertain behavior of skin: Secondary | ICD-10-CM | POA: Diagnosis not present

## 2016-02-07 DIAGNOSIS — C44329 Squamous cell carcinoma of skin of other parts of face: Secondary | ICD-10-CM | POA: Diagnosis not present

## 2016-02-07 DIAGNOSIS — L570 Actinic keratosis: Secondary | ICD-10-CM | POA: Diagnosis not present

## 2016-02-08 DIAGNOSIS — I10 Essential (primary) hypertension: Secondary | ICD-10-CM | POA: Diagnosis not present

## 2016-02-08 DIAGNOSIS — D7589 Other specified diseases of blood and blood-forming organs: Secondary | ICD-10-CM | POA: Diagnosis not present

## 2016-02-08 DIAGNOSIS — E119 Type 2 diabetes mellitus without complications: Secondary | ICD-10-CM | POA: Diagnosis not present

## 2016-02-08 DIAGNOSIS — I25110 Atherosclerotic heart disease of native coronary artery with unstable angina pectoris: Secondary | ICD-10-CM | POA: Diagnosis not present

## 2016-02-08 DIAGNOSIS — I4510 Unspecified right bundle-branch block: Secondary | ICD-10-CM | POA: Diagnosis not present

## 2016-02-08 DIAGNOSIS — L989 Disorder of the skin and subcutaneous tissue, unspecified: Secondary | ICD-10-CM | POA: Diagnosis not present

## 2016-02-11 DIAGNOSIS — L989 Disorder of the skin and subcutaneous tissue, unspecified: Secondary | ICD-10-CM | POA: Diagnosis not present

## 2016-02-11 DIAGNOSIS — D7589 Other specified diseases of blood and blood-forming organs: Secondary | ICD-10-CM | POA: Diagnosis not present

## 2016-02-11 DIAGNOSIS — I10 Essential (primary) hypertension: Secondary | ICD-10-CM | POA: Diagnosis not present

## 2016-02-11 DIAGNOSIS — I25110 Atherosclerotic heart disease of native coronary artery with unstable angina pectoris: Secondary | ICD-10-CM | POA: Diagnosis not present

## 2016-02-11 DIAGNOSIS — E119 Type 2 diabetes mellitus without complications: Secondary | ICD-10-CM | POA: Diagnosis not present

## 2016-02-11 DIAGNOSIS — I4510 Unspecified right bundle-branch block: Secondary | ICD-10-CM | POA: Diagnosis not present

## 2016-02-13 DIAGNOSIS — E119 Type 2 diabetes mellitus without complications: Secondary | ICD-10-CM | POA: Diagnosis not present

## 2016-02-13 DIAGNOSIS — I25110 Atherosclerotic heart disease of native coronary artery with unstable angina pectoris: Secondary | ICD-10-CM | POA: Diagnosis not present

## 2016-02-13 DIAGNOSIS — L989 Disorder of the skin and subcutaneous tissue, unspecified: Secondary | ICD-10-CM | POA: Diagnosis not present

## 2016-02-13 DIAGNOSIS — I10 Essential (primary) hypertension: Secondary | ICD-10-CM | POA: Diagnosis not present

## 2016-02-13 DIAGNOSIS — I4510 Unspecified right bundle-branch block: Secondary | ICD-10-CM | POA: Diagnosis not present

## 2016-02-13 DIAGNOSIS — D7589 Other specified diseases of blood and blood-forming organs: Secondary | ICD-10-CM | POA: Diagnosis not present

## 2016-02-15 DIAGNOSIS — I25110 Atherosclerotic heart disease of native coronary artery with unstable angina pectoris: Secondary | ICD-10-CM | POA: Diagnosis not present

## 2016-02-15 DIAGNOSIS — I4510 Unspecified right bundle-branch block: Secondary | ICD-10-CM | POA: Diagnosis not present

## 2016-02-15 DIAGNOSIS — I10 Essential (primary) hypertension: Secondary | ICD-10-CM | POA: Diagnosis not present

## 2016-02-15 DIAGNOSIS — L989 Disorder of the skin and subcutaneous tissue, unspecified: Secondary | ICD-10-CM | POA: Diagnosis not present

## 2016-02-15 DIAGNOSIS — D7589 Other specified diseases of blood and blood-forming organs: Secondary | ICD-10-CM | POA: Diagnosis not present

## 2016-02-15 DIAGNOSIS — E119 Type 2 diabetes mellitus without complications: Secondary | ICD-10-CM | POA: Diagnosis not present

## 2016-02-18 DIAGNOSIS — I10 Essential (primary) hypertension: Secondary | ICD-10-CM | POA: Diagnosis not present

## 2016-02-18 DIAGNOSIS — E119 Type 2 diabetes mellitus without complications: Secondary | ICD-10-CM | POA: Diagnosis not present

## 2016-02-18 DIAGNOSIS — I25110 Atherosclerotic heart disease of native coronary artery with unstable angina pectoris: Secondary | ICD-10-CM | POA: Diagnosis not present

## 2016-02-18 DIAGNOSIS — L989 Disorder of the skin and subcutaneous tissue, unspecified: Secondary | ICD-10-CM | POA: Diagnosis not present

## 2016-02-18 DIAGNOSIS — I4510 Unspecified right bundle-branch block: Secondary | ICD-10-CM | POA: Diagnosis not present

## 2016-02-18 DIAGNOSIS — D7589 Other specified diseases of blood and blood-forming organs: Secondary | ICD-10-CM | POA: Diagnosis not present

## 2016-02-19 DIAGNOSIS — L570 Actinic keratosis: Secondary | ICD-10-CM | POA: Diagnosis not present

## 2016-02-20 DIAGNOSIS — D7589 Other specified diseases of blood and blood-forming organs: Secondary | ICD-10-CM | POA: Diagnosis not present

## 2016-02-20 DIAGNOSIS — E119 Type 2 diabetes mellitus without complications: Secondary | ICD-10-CM | POA: Diagnosis not present

## 2016-02-20 DIAGNOSIS — I25110 Atherosclerotic heart disease of native coronary artery with unstable angina pectoris: Secondary | ICD-10-CM | POA: Diagnosis not present

## 2016-02-20 DIAGNOSIS — I4510 Unspecified right bundle-branch block: Secondary | ICD-10-CM | POA: Diagnosis not present

## 2016-02-20 DIAGNOSIS — L989 Disorder of the skin and subcutaneous tissue, unspecified: Secondary | ICD-10-CM | POA: Diagnosis not present

## 2016-02-20 DIAGNOSIS — I10 Essential (primary) hypertension: Secondary | ICD-10-CM | POA: Diagnosis not present

## 2016-02-21 DIAGNOSIS — D7589 Other specified diseases of blood and blood-forming organs: Secondary | ICD-10-CM | POA: Diagnosis not present

## 2016-02-21 DIAGNOSIS — I25110 Atherosclerotic heart disease of native coronary artery with unstable angina pectoris: Secondary | ICD-10-CM | POA: Diagnosis not present

## 2016-02-21 DIAGNOSIS — E119 Type 2 diabetes mellitus without complications: Secondary | ICD-10-CM | POA: Diagnosis not present

## 2016-02-21 DIAGNOSIS — I10 Essential (primary) hypertension: Secondary | ICD-10-CM | POA: Diagnosis not present

## 2016-02-21 DIAGNOSIS — L989 Disorder of the skin and subcutaneous tissue, unspecified: Secondary | ICD-10-CM | POA: Diagnosis not present

## 2016-02-21 DIAGNOSIS — I4510 Unspecified right bundle-branch block: Secondary | ICD-10-CM | POA: Diagnosis not present

## 2016-02-22 DIAGNOSIS — E119 Type 2 diabetes mellitus without complications: Secondary | ICD-10-CM | POA: Diagnosis not present

## 2016-02-22 DIAGNOSIS — I4510 Unspecified right bundle-branch block: Secondary | ICD-10-CM | POA: Diagnosis not present

## 2016-02-22 DIAGNOSIS — D7589 Other specified diseases of blood and blood-forming organs: Secondary | ICD-10-CM | POA: Diagnosis not present

## 2016-02-22 DIAGNOSIS — I10 Essential (primary) hypertension: Secondary | ICD-10-CM | POA: Diagnosis not present

## 2016-02-22 DIAGNOSIS — L989 Disorder of the skin and subcutaneous tissue, unspecified: Secondary | ICD-10-CM | POA: Diagnosis not present

## 2016-02-22 DIAGNOSIS — I25110 Atherosclerotic heart disease of native coronary artery with unstable angina pectoris: Secondary | ICD-10-CM | POA: Diagnosis not present

## 2016-02-25 DIAGNOSIS — D7589 Other specified diseases of blood and blood-forming organs: Secondary | ICD-10-CM | POA: Diagnosis not present

## 2016-02-25 DIAGNOSIS — I10 Essential (primary) hypertension: Secondary | ICD-10-CM | POA: Diagnosis not present

## 2016-02-25 DIAGNOSIS — I25110 Atherosclerotic heart disease of native coronary artery with unstable angina pectoris: Secondary | ICD-10-CM | POA: Diagnosis not present

## 2016-02-25 DIAGNOSIS — L989 Disorder of the skin and subcutaneous tissue, unspecified: Secondary | ICD-10-CM | POA: Diagnosis not present

## 2016-02-25 DIAGNOSIS — I4510 Unspecified right bundle-branch block: Secondary | ICD-10-CM | POA: Diagnosis not present

## 2016-02-25 DIAGNOSIS — E119 Type 2 diabetes mellitus without complications: Secondary | ICD-10-CM | POA: Diagnosis not present

## 2016-02-27 DIAGNOSIS — I25110 Atherosclerotic heart disease of native coronary artery with unstable angina pectoris: Secondary | ICD-10-CM | POA: Diagnosis not present

## 2016-02-27 DIAGNOSIS — I4510 Unspecified right bundle-branch block: Secondary | ICD-10-CM | POA: Diagnosis not present

## 2016-02-27 DIAGNOSIS — D7589 Other specified diseases of blood and blood-forming organs: Secondary | ICD-10-CM | POA: Diagnosis not present

## 2016-02-27 DIAGNOSIS — L989 Disorder of the skin and subcutaneous tissue, unspecified: Secondary | ICD-10-CM | POA: Diagnosis not present

## 2016-02-27 DIAGNOSIS — I10 Essential (primary) hypertension: Secondary | ICD-10-CM | POA: Diagnosis not present

## 2016-02-27 DIAGNOSIS — E119 Type 2 diabetes mellitus without complications: Secondary | ICD-10-CM | POA: Diagnosis not present

## 2016-02-29 DIAGNOSIS — L989 Disorder of the skin and subcutaneous tissue, unspecified: Secondary | ICD-10-CM | POA: Diagnosis not present

## 2016-02-29 DIAGNOSIS — I4510 Unspecified right bundle-branch block: Secondary | ICD-10-CM | POA: Diagnosis not present

## 2016-02-29 DIAGNOSIS — D7589 Other specified diseases of blood and blood-forming organs: Secondary | ICD-10-CM | POA: Diagnosis not present

## 2016-02-29 DIAGNOSIS — E119 Type 2 diabetes mellitus without complications: Secondary | ICD-10-CM | POA: Diagnosis not present

## 2016-02-29 DIAGNOSIS — I25110 Atherosclerotic heart disease of native coronary artery with unstable angina pectoris: Secondary | ICD-10-CM | POA: Diagnosis not present

## 2016-02-29 DIAGNOSIS — I10 Essential (primary) hypertension: Secondary | ICD-10-CM | POA: Diagnosis not present

## 2016-03-03 DIAGNOSIS — I25110 Atherosclerotic heart disease of native coronary artery with unstable angina pectoris: Secondary | ICD-10-CM | POA: Diagnosis not present

## 2016-03-03 DIAGNOSIS — E119 Type 2 diabetes mellitus without complications: Secondary | ICD-10-CM | POA: Diagnosis not present

## 2016-03-03 DIAGNOSIS — I10 Essential (primary) hypertension: Secondary | ICD-10-CM | POA: Diagnosis not present

## 2016-03-03 DIAGNOSIS — D7589 Other specified diseases of blood and blood-forming organs: Secondary | ICD-10-CM | POA: Diagnosis not present

## 2016-03-03 DIAGNOSIS — L989 Disorder of the skin and subcutaneous tissue, unspecified: Secondary | ICD-10-CM | POA: Diagnosis not present

## 2016-03-03 DIAGNOSIS — I4510 Unspecified right bundle-branch block: Secondary | ICD-10-CM | POA: Diagnosis not present

## 2016-03-04 DIAGNOSIS — I25110 Atherosclerotic heart disease of native coronary artery with unstable angina pectoris: Secondary | ICD-10-CM | POA: Diagnosis not present

## 2016-03-04 DIAGNOSIS — D7589 Other specified diseases of blood and blood-forming organs: Secondary | ICD-10-CM | POA: Diagnosis not present

## 2016-03-04 DIAGNOSIS — I10 Essential (primary) hypertension: Secondary | ICD-10-CM | POA: Diagnosis not present

## 2016-03-04 DIAGNOSIS — E119 Type 2 diabetes mellitus without complications: Secondary | ICD-10-CM | POA: Diagnosis not present

## 2016-03-04 DIAGNOSIS — L989 Disorder of the skin and subcutaneous tissue, unspecified: Secondary | ICD-10-CM | POA: Diagnosis not present

## 2016-03-04 DIAGNOSIS — I4510 Unspecified right bundle-branch block: Secondary | ICD-10-CM | POA: Diagnosis not present

## 2016-03-05 DIAGNOSIS — I25110 Atherosclerotic heart disease of native coronary artery with unstable angina pectoris: Secondary | ICD-10-CM | POA: Diagnosis not present

## 2016-03-05 DIAGNOSIS — D7589 Other specified diseases of blood and blood-forming organs: Secondary | ICD-10-CM | POA: Diagnosis not present

## 2016-03-05 DIAGNOSIS — L989 Disorder of the skin and subcutaneous tissue, unspecified: Secondary | ICD-10-CM | POA: Diagnosis not present

## 2016-03-05 DIAGNOSIS — I10 Essential (primary) hypertension: Secondary | ICD-10-CM | POA: Diagnosis not present

## 2016-03-05 DIAGNOSIS — I4510 Unspecified right bundle-branch block: Secondary | ICD-10-CM | POA: Diagnosis not present

## 2016-03-05 DIAGNOSIS — E119 Type 2 diabetes mellitus without complications: Secondary | ICD-10-CM | POA: Diagnosis not present

## 2016-03-06 DIAGNOSIS — L989 Disorder of the skin and subcutaneous tissue, unspecified: Secondary | ICD-10-CM | POA: Diagnosis not present

## 2016-03-06 DIAGNOSIS — E119 Type 2 diabetes mellitus without complications: Secondary | ICD-10-CM | POA: Diagnosis not present

## 2016-03-06 DIAGNOSIS — I4510 Unspecified right bundle-branch block: Secondary | ICD-10-CM | POA: Diagnosis not present

## 2016-03-06 DIAGNOSIS — I25110 Atherosclerotic heart disease of native coronary artery with unstable angina pectoris: Secondary | ICD-10-CM | POA: Diagnosis not present

## 2016-03-06 DIAGNOSIS — D7589 Other specified diseases of blood and blood-forming organs: Secondary | ICD-10-CM | POA: Diagnosis not present

## 2016-03-06 DIAGNOSIS — I10 Essential (primary) hypertension: Secondary | ICD-10-CM | POA: Diagnosis not present

## 2016-03-07 DIAGNOSIS — I4510 Unspecified right bundle-branch block: Secondary | ICD-10-CM | POA: Diagnosis not present

## 2016-03-07 DIAGNOSIS — I10 Essential (primary) hypertension: Secondary | ICD-10-CM | POA: Diagnosis not present

## 2016-03-07 DIAGNOSIS — E119 Type 2 diabetes mellitus without complications: Secondary | ICD-10-CM | POA: Diagnosis not present

## 2016-03-07 DIAGNOSIS — L989 Disorder of the skin and subcutaneous tissue, unspecified: Secondary | ICD-10-CM | POA: Diagnosis not present

## 2016-03-07 DIAGNOSIS — I25110 Atherosclerotic heart disease of native coronary artery with unstable angina pectoris: Secondary | ICD-10-CM | POA: Diagnosis not present

## 2016-03-07 DIAGNOSIS — D7589 Other specified diseases of blood and blood-forming organs: Secondary | ICD-10-CM | POA: Diagnosis not present

## 2016-03-10 DIAGNOSIS — L989 Disorder of the skin and subcutaneous tissue, unspecified: Secondary | ICD-10-CM | POA: Diagnosis not present

## 2016-03-10 DIAGNOSIS — I4510 Unspecified right bundle-branch block: Secondary | ICD-10-CM | POA: Diagnosis not present

## 2016-03-10 DIAGNOSIS — E119 Type 2 diabetes mellitus without complications: Secondary | ICD-10-CM | POA: Diagnosis not present

## 2016-03-10 DIAGNOSIS — D7589 Other specified diseases of blood and blood-forming organs: Secondary | ICD-10-CM | POA: Diagnosis not present

## 2016-03-10 DIAGNOSIS — I10 Essential (primary) hypertension: Secondary | ICD-10-CM | POA: Diagnosis not present

## 2016-03-10 DIAGNOSIS — I25110 Atherosclerotic heart disease of native coronary artery with unstable angina pectoris: Secondary | ICD-10-CM | POA: Diagnosis not present

## 2016-03-12 DIAGNOSIS — I4510 Unspecified right bundle-branch block: Secondary | ICD-10-CM | POA: Diagnosis not present

## 2016-03-12 DIAGNOSIS — D7589 Other specified diseases of blood and blood-forming organs: Secondary | ICD-10-CM | POA: Diagnosis not present

## 2016-03-12 DIAGNOSIS — I10 Essential (primary) hypertension: Secondary | ICD-10-CM | POA: Diagnosis not present

## 2016-03-12 DIAGNOSIS — E119 Type 2 diabetes mellitus without complications: Secondary | ICD-10-CM | POA: Diagnosis not present

## 2016-03-12 DIAGNOSIS — I25110 Atherosclerotic heart disease of native coronary artery with unstable angina pectoris: Secondary | ICD-10-CM | POA: Diagnosis not present

## 2016-03-12 DIAGNOSIS — L989 Disorder of the skin and subcutaneous tissue, unspecified: Secondary | ICD-10-CM | POA: Diagnosis not present

## 2016-03-14 DIAGNOSIS — I10 Essential (primary) hypertension: Secondary | ICD-10-CM | POA: Diagnosis not present

## 2016-03-14 DIAGNOSIS — E119 Type 2 diabetes mellitus without complications: Secondary | ICD-10-CM | POA: Diagnosis not present

## 2016-03-14 DIAGNOSIS — D7589 Other specified diseases of blood and blood-forming organs: Secondary | ICD-10-CM | POA: Diagnosis not present

## 2016-03-14 DIAGNOSIS — I25110 Atherosclerotic heart disease of native coronary artery with unstable angina pectoris: Secondary | ICD-10-CM | POA: Diagnosis not present

## 2016-03-14 DIAGNOSIS — L989 Disorder of the skin and subcutaneous tissue, unspecified: Secondary | ICD-10-CM | POA: Diagnosis not present

## 2016-03-14 DIAGNOSIS — I4510 Unspecified right bundle-branch block: Secondary | ICD-10-CM | POA: Diagnosis not present

## 2016-03-17 DIAGNOSIS — D0439 Carcinoma in situ of skin of other parts of face: Secondary | ICD-10-CM | POA: Diagnosis not present

## 2016-03-18 DIAGNOSIS — I4510 Unspecified right bundle-branch block: Secondary | ICD-10-CM | POA: Diagnosis not present

## 2016-03-18 DIAGNOSIS — L989 Disorder of the skin and subcutaneous tissue, unspecified: Secondary | ICD-10-CM | POA: Diagnosis not present

## 2016-03-18 DIAGNOSIS — D7589 Other specified diseases of blood and blood-forming organs: Secondary | ICD-10-CM | POA: Diagnosis not present

## 2016-03-18 DIAGNOSIS — I25110 Atherosclerotic heart disease of native coronary artery with unstable angina pectoris: Secondary | ICD-10-CM | POA: Diagnosis not present

## 2016-03-18 DIAGNOSIS — I10 Essential (primary) hypertension: Secondary | ICD-10-CM | POA: Diagnosis not present

## 2016-03-18 DIAGNOSIS — E119 Type 2 diabetes mellitus without complications: Secondary | ICD-10-CM | POA: Diagnosis not present

## 2016-03-19 DIAGNOSIS — L989 Disorder of the skin and subcutaneous tissue, unspecified: Secondary | ICD-10-CM | POA: Diagnosis not present

## 2016-03-19 DIAGNOSIS — D7589 Other specified diseases of blood and blood-forming organs: Secondary | ICD-10-CM | POA: Diagnosis not present

## 2016-03-19 DIAGNOSIS — I10 Essential (primary) hypertension: Secondary | ICD-10-CM | POA: Diagnosis not present

## 2016-03-19 DIAGNOSIS — I4510 Unspecified right bundle-branch block: Secondary | ICD-10-CM | POA: Diagnosis not present

## 2016-03-19 DIAGNOSIS — E119 Type 2 diabetes mellitus without complications: Secondary | ICD-10-CM | POA: Diagnosis not present

## 2016-03-19 DIAGNOSIS — I25110 Atherosclerotic heart disease of native coronary artery with unstable angina pectoris: Secondary | ICD-10-CM | POA: Diagnosis not present

## 2016-03-21 DIAGNOSIS — I25110 Atherosclerotic heart disease of native coronary artery with unstable angina pectoris: Secondary | ICD-10-CM | POA: Diagnosis not present

## 2016-03-21 DIAGNOSIS — I4510 Unspecified right bundle-branch block: Secondary | ICD-10-CM | POA: Diagnosis not present

## 2016-03-21 DIAGNOSIS — D7589 Other specified diseases of blood and blood-forming organs: Secondary | ICD-10-CM | POA: Diagnosis not present

## 2016-03-21 DIAGNOSIS — L989 Disorder of the skin and subcutaneous tissue, unspecified: Secondary | ICD-10-CM | POA: Diagnosis not present

## 2016-03-21 DIAGNOSIS — E119 Type 2 diabetes mellitus without complications: Secondary | ICD-10-CM | POA: Diagnosis not present

## 2016-03-21 DIAGNOSIS — I10 Essential (primary) hypertension: Secondary | ICD-10-CM | POA: Diagnosis not present

## 2016-03-24 DIAGNOSIS — I4510 Unspecified right bundle-branch block: Secondary | ICD-10-CM | POA: Diagnosis not present

## 2016-03-24 DIAGNOSIS — I10 Essential (primary) hypertension: Secondary | ICD-10-CM | POA: Diagnosis not present

## 2016-03-24 DIAGNOSIS — L989 Disorder of the skin and subcutaneous tissue, unspecified: Secondary | ICD-10-CM | POA: Diagnosis not present

## 2016-03-24 DIAGNOSIS — I25110 Atherosclerotic heart disease of native coronary artery with unstable angina pectoris: Secondary | ICD-10-CM | POA: Diagnosis not present

## 2016-03-24 DIAGNOSIS — D7589 Other specified diseases of blood and blood-forming organs: Secondary | ICD-10-CM | POA: Diagnosis not present

## 2016-03-24 DIAGNOSIS — E119 Type 2 diabetes mellitus without complications: Secondary | ICD-10-CM | POA: Diagnosis not present

## 2016-03-25 DIAGNOSIS — D7589 Other specified diseases of blood and blood-forming organs: Secondary | ICD-10-CM | POA: Diagnosis not present

## 2016-03-25 DIAGNOSIS — E119 Type 2 diabetes mellitus without complications: Secondary | ICD-10-CM | POA: Diagnosis not present

## 2016-03-25 DIAGNOSIS — I25110 Atherosclerotic heart disease of native coronary artery with unstable angina pectoris: Secondary | ICD-10-CM | POA: Diagnosis not present

## 2016-03-25 DIAGNOSIS — L989 Disorder of the skin and subcutaneous tissue, unspecified: Secondary | ICD-10-CM | POA: Diagnosis not present

## 2016-03-25 DIAGNOSIS — I4510 Unspecified right bundle-branch block: Secondary | ICD-10-CM | POA: Diagnosis not present

## 2016-03-25 DIAGNOSIS — I10 Essential (primary) hypertension: Secondary | ICD-10-CM | POA: Diagnosis not present

## 2016-04-02 DIAGNOSIS — L989 Disorder of the skin and subcutaneous tissue, unspecified: Secondary | ICD-10-CM | POA: Diagnosis not present

## 2016-04-02 DIAGNOSIS — E119 Type 2 diabetes mellitus without complications: Secondary | ICD-10-CM | POA: Diagnosis not present

## 2016-04-02 DIAGNOSIS — I10 Essential (primary) hypertension: Secondary | ICD-10-CM | POA: Diagnosis not present

## 2016-04-02 DIAGNOSIS — I25110 Atherosclerotic heart disease of native coronary artery with unstable angina pectoris: Secondary | ICD-10-CM | POA: Diagnosis not present

## 2016-04-02 DIAGNOSIS — I4510 Unspecified right bundle-branch block: Secondary | ICD-10-CM | POA: Diagnosis not present

## 2016-04-02 DIAGNOSIS — D7589 Other specified diseases of blood and blood-forming organs: Secondary | ICD-10-CM | POA: Diagnosis not present

## 2016-04-04 DIAGNOSIS — I25110 Atherosclerotic heart disease of native coronary artery with unstable angina pectoris: Secondary | ICD-10-CM | POA: Diagnosis not present

## 2016-04-04 DIAGNOSIS — E119 Type 2 diabetes mellitus without complications: Secondary | ICD-10-CM | POA: Diagnosis not present

## 2016-04-04 DIAGNOSIS — D7589 Other specified diseases of blood and blood-forming organs: Secondary | ICD-10-CM | POA: Diagnosis not present

## 2016-04-04 DIAGNOSIS — I10 Essential (primary) hypertension: Secondary | ICD-10-CM | POA: Diagnosis not present

## 2016-04-04 DIAGNOSIS — I4510 Unspecified right bundle-branch block: Secondary | ICD-10-CM | POA: Diagnosis not present

## 2016-04-04 DIAGNOSIS — L989 Disorder of the skin and subcutaneous tissue, unspecified: Secondary | ICD-10-CM | POA: Diagnosis not present

## 2016-05-06 DIAGNOSIS — L308 Other specified dermatitis: Secondary | ICD-10-CM | POA: Diagnosis not present

## 2016-05-06 DIAGNOSIS — D0439 Carcinoma in situ of skin of other parts of face: Secondary | ICD-10-CM | POA: Diagnosis not present

## 2016-05-06 DIAGNOSIS — C44329 Squamous cell carcinoma of skin of other parts of face: Secondary | ICD-10-CM | POA: Diagnosis not present

## 2016-05-20 DIAGNOSIS — S01402A Unspecified open wound of left cheek and temporomandibular area, initial encounter: Secondary | ICD-10-CM | POA: Diagnosis not present

## 2016-05-20 DIAGNOSIS — S01401A Unspecified open wound of right cheek and temporomandibular area, initial encounter: Secondary | ICD-10-CM | POA: Diagnosis not present

## 2016-06-11 DIAGNOSIS — E119 Type 2 diabetes mellitus without complications: Secondary | ICD-10-CM | POA: Diagnosis not present

## 2016-06-11 DIAGNOSIS — I10 Essential (primary) hypertension: Secondary | ICD-10-CM | POA: Diagnosis not present

## 2016-06-11 DIAGNOSIS — Z8673 Personal history of transient ischemic attack (TIA), and cerebral infarction without residual deficits: Secondary | ICD-10-CM | POA: Diagnosis not present

## 2016-06-11 DIAGNOSIS — Z23 Encounter for immunization: Secondary | ICD-10-CM | POA: Diagnosis not present

## 2016-06-24 DIAGNOSIS — L308 Other specified dermatitis: Secondary | ICD-10-CM | POA: Diagnosis not present

## 2016-08-12 DIAGNOSIS — I10 Essential (primary) hypertension: Secondary | ICD-10-CM | POA: Diagnosis not present

## 2016-08-12 DIAGNOSIS — D649 Anemia, unspecified: Secondary | ICD-10-CM | POA: Diagnosis not present

## 2016-08-12 DIAGNOSIS — R05 Cough: Secondary | ICD-10-CM | POA: Diagnosis not present

## 2016-08-12 DIAGNOSIS — Z79899 Other long term (current) drug therapy: Secondary | ICD-10-CM | POA: Diagnosis not present

## 2016-08-12 DIAGNOSIS — I6350 Cerebral infarction due to unspecified occlusion or stenosis of unspecified cerebral artery: Secondary | ICD-10-CM | POA: Diagnosis not present

## 2016-08-12 DIAGNOSIS — E119 Type 2 diabetes mellitus without complications: Secondary | ICD-10-CM | POA: Diagnosis not present

## 2016-08-26 DIAGNOSIS — L98499 Non-pressure chronic ulcer of skin of other sites with unspecified severity: Secondary | ICD-10-CM | POA: Diagnosis not present

## 2016-09-02 DIAGNOSIS — S0100XA Unspecified open wound of scalp, initial encounter: Secondary | ICD-10-CM | POA: Diagnosis not present

## 2016-09-02 DIAGNOSIS — S2190XA Unspecified open wound of unspecified part of thorax, initial encounter: Secondary | ICD-10-CM | POA: Diagnosis not present

## 2016-09-09 DIAGNOSIS — S0100XD Unspecified open wound of scalp, subsequent encounter: Secondary | ICD-10-CM | POA: Diagnosis not present

## 2016-09-09 DIAGNOSIS — S21102D Unspecified open wound of left front wall of thorax without penetration into thoracic cavity, subsequent encounter: Secondary | ICD-10-CM | POA: Diagnosis not present

## 2016-09-16 DIAGNOSIS — S0100XA Unspecified open wound of scalp, initial encounter: Secondary | ICD-10-CM | POA: Diagnosis not present

## 2016-09-16 DIAGNOSIS — S21102A Unspecified open wound of left front wall of thorax without penetration into thoracic cavity, initial encounter: Secondary | ICD-10-CM | POA: Diagnosis not present

## 2016-09-16 DIAGNOSIS — Z4801 Encounter for change or removal of surgical wound dressing: Secondary | ICD-10-CM | POA: Diagnosis not present

## 2016-09-17 ENCOUNTER — Emergency Department (HOSPITAL_COMMUNITY): Payer: Medicare Other

## 2016-09-17 ENCOUNTER — Encounter (HOSPITAL_COMMUNITY): Payer: Self-pay | Admitting: Emergency Medicine

## 2016-09-17 ENCOUNTER — Inpatient Hospital Stay (HOSPITAL_COMMUNITY)
Admission: EM | Admit: 2016-09-17 | Discharge: 2016-09-24 | DRG: 445 | Disposition: A | Discharge status: Home Health | Payer: Medicare Other | Attending: Internal Medicine | Admitting: Internal Medicine

## 2016-09-17 DIAGNOSIS — E871 Hypo-osmolality and hyponatremia: Secondary | ICD-10-CM | POA: Diagnosis present

## 2016-09-17 DIAGNOSIS — Z8546 Personal history of malignant neoplasm of prostate: Secondary | ICD-10-CM

## 2016-09-17 DIAGNOSIS — E46 Unspecified protein-calorie malnutrition: Secondary | ICD-10-CM | POA: Diagnosis present

## 2016-09-17 DIAGNOSIS — F0390 Unspecified dementia without behavioral disturbance: Secondary | ICD-10-CM | POA: Diagnosis present

## 2016-09-17 DIAGNOSIS — Z8673 Personal history of transient ischemic attack (TIA), and cerebral infarction without residual deficits: Secondary | ICD-10-CM

## 2016-09-17 DIAGNOSIS — E860 Dehydration: Secondary | ICD-10-CM | POA: Diagnosis present

## 2016-09-17 DIAGNOSIS — Z7401 Bed confinement status: Secondary | ICD-10-CM

## 2016-09-17 DIAGNOSIS — K810 Acute cholecystitis: Secondary | ICD-10-CM | POA: Diagnosis present

## 2016-09-17 DIAGNOSIS — K8010 Calculus of gallbladder with chronic cholecystitis without obstruction: Secondary | ICD-10-CM

## 2016-09-17 DIAGNOSIS — I639 Cerebral infarction, unspecified: Secondary | ICD-10-CM | POA: Diagnosis present

## 2016-09-17 DIAGNOSIS — I10 Essential (primary) hypertension: Secondary | ICD-10-CM | POA: Diagnosis present

## 2016-09-17 DIAGNOSIS — E1140 Type 2 diabetes mellitus with diabetic neuropathy, unspecified: Secondary | ICD-10-CM | POA: Diagnosis present

## 2016-09-17 DIAGNOSIS — G20 Parkinson's disease: Secondary | ICD-10-CM | POA: Diagnosis present

## 2016-09-17 DIAGNOSIS — G8929 Other chronic pain: Secondary | ICD-10-CM | POA: Diagnosis present

## 2016-09-17 DIAGNOSIS — Z7982 Long term (current) use of aspirin: Secondary | ICD-10-CM

## 2016-09-17 DIAGNOSIS — R296 Repeated falls: Secondary | ICD-10-CM | POA: Diagnosis present

## 2016-09-17 DIAGNOSIS — Z87891 Personal history of nicotine dependence: Secondary | ICD-10-CM | POA: Diagnosis not present

## 2016-09-17 DIAGNOSIS — I480 Paroxysmal atrial fibrillation: Secondary | ICD-10-CM | POA: Diagnosis present

## 2016-09-17 DIAGNOSIS — R9431 Abnormal electrocardiogram [ECG] [EKG]: Secondary | ICD-10-CM | POA: Diagnosis not present

## 2016-09-17 DIAGNOSIS — K819 Cholecystitis, unspecified: Secondary | ICD-10-CM | POA: Diagnosis not present

## 2016-09-17 DIAGNOSIS — E119 Type 2 diabetes mellitus without complications: Secondary | ICD-10-CM | POA: Diagnosis not present

## 2016-09-17 DIAGNOSIS — F03A0 Unspecified dementia, mild, without behavioral disturbance, psychotic disturbance, mood disturbance, and anxiety: Secondary | ICD-10-CM | POA: Diagnosis present

## 2016-09-17 DIAGNOSIS — Z9889 Other specified postprocedural states: Secondary | ICD-10-CM

## 2016-09-17 DIAGNOSIS — Z6825 Body mass index (BMI) 25.0-25.9, adult: Secondary | ICD-10-CM

## 2016-09-17 DIAGNOSIS — E1136 Type 2 diabetes mellitus with diabetic cataract: Secondary | ICD-10-CM | POA: Diagnosis present

## 2016-09-17 DIAGNOSIS — Z79899 Other long term (current) drug therapy: Secondary | ICD-10-CM | POA: Diagnosis not present

## 2016-09-17 DIAGNOSIS — G20C Parkinsonism, unspecified: Secondary | ICD-10-CM | POA: Diagnosis present

## 2016-09-17 DIAGNOSIS — K8000 Calculus of gallbladder with acute cholecystitis without obstruction: Secondary | ICD-10-CM | POA: Diagnosis not present

## 2016-09-17 DIAGNOSIS — R279 Unspecified lack of coordination: Secondary | ICD-10-CM | POA: Diagnosis not present

## 2016-09-17 DIAGNOSIS — I4510 Unspecified right bundle-branch block: Secondary | ICD-10-CM | POA: Diagnosis present

## 2016-09-17 DIAGNOSIS — R109 Unspecified abdominal pain: Secondary | ICD-10-CM | POA: Diagnosis not present

## 2016-09-17 DIAGNOSIS — Z7984 Long term (current) use of oral hypoglycemic drugs: Secondary | ICD-10-CM

## 2016-09-17 DIAGNOSIS — R10819 Abdominal tenderness, unspecified site: Secondary | ICD-10-CM | POA: Diagnosis not present

## 2016-09-17 DIAGNOSIS — M549 Dorsalgia, unspecified: Secondary | ICD-10-CM | POA: Diagnosis present

## 2016-09-17 DIAGNOSIS — E876 Hypokalemia: Secondary | ICD-10-CM | POA: Diagnosis present

## 2016-09-17 DIAGNOSIS — M1990 Unspecified osteoarthritis, unspecified site: Secondary | ICD-10-CM | POA: Diagnosis present

## 2016-09-17 DIAGNOSIS — Z8679 Personal history of other diseases of the circulatory system: Secondary | ICD-10-CM

## 2016-09-17 DIAGNOSIS — R1011 Right upper quadrant pain: Secondary | ICD-10-CM | POA: Diagnosis present

## 2016-09-17 DIAGNOSIS — L89151 Pressure ulcer of sacral region, stage 1: Secondary | ICD-10-CM | POA: Diagnosis present

## 2016-09-17 DIAGNOSIS — L8990 Pressure ulcer of unspecified site, unspecified stage: Secondary | ICD-10-CM | POA: Insufficient documentation

## 2016-09-17 DIAGNOSIS — R071 Chest pain on breathing: Secondary | ICD-10-CM | POA: Diagnosis not present

## 2016-09-17 LAB — COMPREHENSIVE METABOLIC PANEL
ALBUMIN: 3.2 g/dL — AB (ref 3.5–5.0)
ALT: 13 U/L — AB (ref 17–63)
AST: 14 U/L — AB (ref 15–41)
Alkaline Phosphatase: 74 U/L (ref 38–126)
Anion gap: 12 (ref 5–15)
BUN: 15 mg/dL (ref 6–20)
CHLORIDE: 92 mmol/L — AB (ref 101–111)
CO2: 22 mmol/L (ref 22–32)
CREATININE: 0.6 mg/dL — AB (ref 0.61–1.24)
Calcium: 9.4 mg/dL (ref 8.9–10.3)
GFR calc non Af Amer: >60 mL/min (ref >60)
Glucose, Bld: 260 mg/dL — ABNORMAL HIGH (ref 65–99)
Potassium: 3.3 mmol/L — ABNORMAL LOW (ref 3.5–5.1)
SODIUM: 126 mmol/L — AB (ref 135–145)
Total Bilirubin: 1.4 mg/dL — ABNORMAL HIGH (ref 0.3–1.2)
Total Protein: 7.4 g/dL (ref 6.5–8.1)

## 2016-09-17 LAB — CBC WITH DIFFERENTIAL/PLATELET
BASOS ABS: 0 10*3/uL (ref 0.0–0.1)
BASOS PCT: 0 %
EOS ABS: 0 10*3/uL (ref 0.0–0.7)
Eosinophils Relative: 0 %
HCT: 41.4 % (ref 39.0–52.0)
Hemoglobin: 13.9 g/dL (ref 13.0–17.0)
Lymphocytes Relative: 3 %
Lymphs Abs: 0.8 10*3/uL (ref 0.7–4.0)
MCH: 34 pg (ref 26.0–34.0)
MCHC: 33.6 g/dL (ref 30.0–36.0)
MCV: 101.2 fL — ABNORMAL HIGH (ref 78.0–100.0)
Monocytes Absolute: 1.9 10*3/uL — ABNORMAL HIGH (ref 0.1–1.0)
Monocytes Relative: 7 %
Neutro Abs: 24.4 10*3/uL — ABNORMAL HIGH (ref 1.7–7.7)
Neutrophils Relative %: 90 %
PLATELETS: 275 10*3/uL (ref 150–400)
RBC: 4.09 MIL/uL — AB (ref 4.22–5.81)
RDW: 12.9 % (ref 11.5–15.5)
WBC: 27.1 10*3/uL — AB (ref 4.0–10.5)

## 2016-09-17 LAB — LACTIC ACID, PLASMA: LACTIC ACID, VENOUS: 1.3 mmol/L (ref 0.5–1.9)

## 2016-09-17 LAB — LIPASE, BLOOD: Lipase: 15 U/L (ref 11–51)

## 2016-09-17 MED ORDER — SODIUM CHLORIDE 0.9 % IV BOLUS (SEPSIS)
1000.0000 mL | Freq: Once | INTRAVENOUS | Status: AC
  Administered 2016-09-17: 1000 mL via INTRAVENOUS

## 2016-09-17 MED ORDER — PIPERACILLIN-TAZOBACTAM 3.375 G IVPB 30 MIN
3.3750 g | Freq: Once | INTRAVENOUS | Status: AC
  Administered 2016-09-17: 3.375 g via INTRAVENOUS
  Filled 2016-09-17: qty 50

## 2016-09-17 MED ORDER — IOPAMIDOL (ISOVUE-300) INJECTION 61%
INTRAVENOUS | Status: AC
  Administered 2016-09-17: 30 mL
  Filled 2016-09-17: qty 30

## 2016-09-17 MED ORDER — IOPAMIDOL (ISOVUE-300) INJECTION 61%
100.0000 mL | Freq: Once | INTRAVENOUS | Status: AC | PRN
  Administered 2016-09-17: 100 mL via INTRAVENOUS

## 2016-09-17 MED ORDER — ONDANSETRON HCL 4 MG/2ML IJ SOLN
4.0000 mg | Freq: Once | INTRAMUSCULAR | Status: AC
  Administered 2016-09-17: 4 mg via INTRAVENOUS
  Filled 2016-09-17: qty 2

## 2016-09-17 NOTE — ED Provider Notes (Signed)
Stephen Bates   CSN: [id number] Arrival date & time: 09/17/16  1859     History   Chief Complaint Chief Complaint  Patient presents with  . Abdominal Pain    HPI Stephen Bates is a 83 y.o. male.  The history is provided by the patient, medical records and the spouse.  Abdominal Pain   This is a recurrent problem. The current episode started 2 days ago. The problem occurs constantly. The problem has been resolved. The pain is associated with an unknown factor. The pain is located in the RUQ, epigastric region and chest. The pain is moderate. Associated symptoms include diarrhea, nausea and vomiting. Pertinent negatives include fever and hematuria. Nothing aggravates the symptoms. Relieved by: tylenol. Past workup includes CT scan. His past medical history is significant for gallstones.    Past Medical History:  Diagnosis Date  . Arthritis   . Benign neoplasm of colon   . Chronic back pain   . Diabetes ([place])   . Frequent falls 2012  . History of right bundle branch block   . Hypertension   . Lumbar radiculopathy   . Mild dementia   . Neuropathy ([place])   . Prostate cancer ([place])   . Stroke ([place]) 1982  . Weakness of both legs   . Wears glasses     Patient Active Problem List   Diagnosis Date Noted  . History of right bundle branch block   . Diabetes ([place])   . Cholecystitis 08/24/2015  . Acute cholecystitis 08/24/2015  . Malnutrition of moderate degree 05/24/2015  . Pressure ulcer 05/23/2015  . RUQ abdominal pain 05/22/2015  . 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 CAP (community acquired pneumonia) 01/05/2014  . Hyponatremia 01/03/2014  . Ileus ([place]) 01/03/2014  . Dehydration, mild 01/03/2014  . Mild dementia 11/29/2013  . Parkinsonism ([place]) 11/29/2013  . Gait difficulty 11/29/2013  . Neuropathy ([place]) 11/29/2013  . Memory loss 11/29/2013  . Stroke [facility]) history 07/07/1980    Past Surgical History:  Procedure Laterality Date  .  COLONOSCOPY    . ERCP N/A 05/24/2015   Procedure: ENDOSCOPIC RETROGRADE CHOLANGIOPANCREATOGRAPHY;  Surgeon: Rogene Houston, MD;  Location: [facility] OR[facility];  Service: Endoscopy;  Laterality: N/A;  . EYE SURGERY  2010   both cataracts  . INSERTION PROSTATE RADIATION SEED  2001  . kyphoplasty    . MOHS SURGERY  12/12/2008  . SPHINCTEROTOMY N/A 05/24/2015   Procedure: BILIARY SPHINCTEROTOMY;  Surgeon: Rogene Houston, MD;  Location: [facility] ORS;  Service: Endoscopy;  Laterality: N/A;       Home Medications    Prior to Admission medications   Medication Sig Start Date End Date Taking? Authorizing Provider  acetaminophen (TYLENOL) 325 MG tablet Take 650 mg by mouth once as needed for mild pain or moderate pain.   Yes Historical Provider, MD  aspirin EC 81 MG tablet Take 81 mg by mouth daily.   Yes Historical Provider, MD  benazepril (LOTENSIN) 40 MG tablet Take 1 tablet by mouth every morning.  10/04/13  Yes Historical Provider, MD  docusate sodium (COLACE) 100 MG capsule Take 100 mg by mouth daily as needed for mild constipation.    Yes Historical Provider, MD  metFORMIN (GLUCOPHAGE-XR) 500 MG 24 hr tablet Take 500 mg by mouth 2 (two) times daily.  08/22/15  Yes Historical Provider, MD  mirtazapine (REMERON) 30 MG tablet Take 30 mg by mouth at bedtime.   Yes Historical Provider, MD  Multiple Vitamins-Minerals (CENTRUM SILVER  ADULT 50+ PO) Take 1 tablet by mouth every morning.   Yes Historical Provider, MD  polyethylene glycol (MIRALAX / GLYCOLAX) packet Take 17 g by mouth daily as needed for mild constipation.    Yes Historical Provider, MD  triamcinolone cream (KENALOG) 0.1 % Apply 1 application topically 2 (two) times daily. To head 09/09/16  Yes Historical Provider, MD    Family History Family History  Problem Relation Age of Onset  . Bladder Cancer Father   . Bronchopulmonary dysplasia Mother   . Colon cancer Sister     Social History Social History  Substance Use Topics  . Smoking status:  Former Smoker    Quit date: 06/08/1969  . Smokeless tobacco: Never Used  . Alcohol use 1.8 oz/week    3 Glasses of wine per week     Comment: "3 glasses of wine a day"     Allergies   Patient has no known allergies.   Review of Systems Review of Systems  Constitutional: Negative for fever.  Gastrointestinal: Positive for abdominal pain, diarrhea, nausea and vomiting.  Genitourinary: Negative for hematuria.  All other systems reviewed and are negative.    Physical Exam Updated Vital Signs BP (!) 85/48   Pulse 71   Temp 99 F (37.2 C) (Oral)   Resp 16   Ht 5' (1.524 m)   Wt 125 lb (56.7 kg)   SpO2 96%   BMI 24.41 kg/m   Physical Exam  Constitutional: He appears well-developed and well-nourished.  HENT:  Head: Normocephalic and atraumatic.  Eyes: Conjunctivae and EOM are normal.  Neck: Normal range of motion.  Cardiovascular: Normal rate.   Pulmonary/Chest: Effort normal. No respiratory distress.  Abdominal: Soft. He exhibits distension. There is no tenderness. There is no guarding.  Musculoskeletal: Normal range of motion. He exhibits no edema or deformity.  Neurological: He is alert. No cranial nerve deficit. Coordination normal.  Skin: Skin is warm and dry. No rash noted. No erythema.  Multiple  Non healing wounds on scalp and one on RLQ  Nursing Bates and vitals reviewed.    ED Treatments / Results  Labs (all labs ordered are listed, but only abnormal results are displayed) Labs Reviewed  CBC WITH DIFFERENTIAL/PLATELET - Abnormal; Notable for the following:       Result Value   WBC 27.1 (*)    RBC 4.09 (*)    MCV 101.2 (*)    Neutro Abs 24.4 (*)    Monocytes Absolute 1.9 (*)    All other components within normal limits  COMPREHENSIVE METABOLIC PANEL - Abnormal; Notable for the following:    Sodium 126 (*)    Potassium 3.3 (*)    Chloride 92 (*)    Glucose, Bld 260 (*)    Creatinine, Ser 0.60 (*)    Albumin 3.2 (*)    AST 14 (*)    ALT 13 (*)     Total Bilirubin 1.4 (*)    All other components within normal limits  LIPASE, BLOOD  LACTIC ACID, PLASMA    EKG  EKG Interpretation  Date/Time:  Wednesday September 17 2016 19:18:14 EDT Ventricular Rate:  116 PR Interval:    QRS Duration: 144 QT Interval:  333 QTC Calculation: 463 R Axis:   -54 Text Interpretation:  sinus tachycardia Multiple ventricular premature complexes Right bundle branch block aside from rate, similar to previous Confirmed by [facility] MD, Corene Cornea [phone]) on 09/17/2016 9:26:18 PM       Radiology Ct Abdomen Pelvis  W Contrast  Result Date: 09/17/2016 CLINICAL DATA:  Right-sided abdominal pain with history of prostate cancer EXAM: CT ABDOMEN AND PELVIS WITH CONTRAST TECHNIQUE: Multidetector CT imaging of the abdomen and pelvis was performed using the standard protocol following bolus administration of intravenous contrast. CONTRAST:  31mL ISOVUE-300 IOPAMIDOL (ISOVUE-300) INJECTION 61%, 133mL ISOVUE-300 IOPAMIDOL (ISOVUE-300) INJECTION 61% COMPARISON:  08/24/2015, 05/22/2015 FINDINGS: Lower chest: Small right-sided pleural effusion. Atelectasis at the left lung base. Partial consolidation in the right lower lobe, atelectasis versus infiltrate. Coronary artery calcifications. No large pericardial effusion. Hepatobiliary: Mild intra hepatic biliary dilatation. There are multiple calcified stones within the gallbladder lumen. There appears to be gallbladder wall thickening. There is edema and soft tissue stranding in the right upper quadrant. Extrahepatic bile duct does not appear dilated. Pancreas: No peripancreatic inflammation. A cystic density at the pancreatic head measures 16 x 13 mm and appears grossly unchanged. Spleen: Normal in size without focal abnormality. Adrenals/Urinary Tract: Subcentimeter hypodense lesions in the kidneys, too small to further characterize. No hydronephrosis. The bladder is enlarged. Stomach/Bowel: The stomach is nondilated. No dilated small bowel.  Appendix normal. Focal wall thickening and inflammation of the hepatic flexure. Moderate stool throughout the colon. Mild wall thickening of the rectum Vascular/Lymphatic: Non aneurysmal aorta. Atherosclerotic calcifications. No grossly enlarged lymph nodes. Reproductive: Multiple seed implants in the prostate. Other: No free air or free fluid. Musculoskeletal: Vertebral augmentation at T11, T12, and L1 as before. Heterogenous sclerosis and lucency within the bilateral femoral heads does not appear significantly changed. IMPRESSION: 1. Multiple calcified gallstones. There is inflammatory process in the right upper quadrant of the abdomen as evidenced by soft tissue stranding, edema, and focal wall thickening of the hepatic flexure. Suspect that the changes are secondary to cholecystitis with reactive wall thickening of adjacent hepatic flexure. 2. Stable 16 mm cystic lesion at the head of the pancreas 3. Mild rectal wall thickening suggestive of proctitis 4. Small right-sided pleural effusion with atelectasis or infiltrate at the right lower lobe. Electronically Signed   By: Donavan Foil M.D.   On: 09/17/2016 21:44    Procedures Procedures (including critical care time)  Medications Ordered in ED Medications  sodium chloride 0.9 % bolus 1,000 mL (0 mLs Intravenous Stopped 09/17/16 2243)  iopamidol (ISOVUE-300) 61 % injection (30 mLs  Contrast Given 09/17/16 2103)  ondansetron (ZOFRAN) injection 4 mg (4 mg Intravenous Given 09/17/16 1956)  iopamidol (ISOVUE-300) 61 % injection 100 mL (100 mLs Intravenous Contrast Given 09/17/16 2104)  sodium chloride 0.9 % bolus 1,000 mL (1,000 mLs Intravenous New Bag/Given 09/17/16 2242)  piperacillin-tazobactam (ZOSYN) IVPB 3.375 g (0 g Intravenous Stopped 09/17/16 2309)     Initial Impression / Assessment and Plan / ED Course  I have reviewed the triage vital signs and the nursing notes.  Pertinent labs & imaging results that were available during my care of the  patient were reviewed by me and considered in my medical decision making (see chart for details).    Possibly recurrent cholecystitis, will ct for same. No pain now. Will hold on pain meeds.   Ct with evidence of cholecystitis. Elevated wbc. otherwise appears well. D/w Dr. Arnoldo Morale with EGS who will see and help manage in AM. D/W hospitalist who will admit.   Final Clinical Impressions(s) / ED Diagnoses   Final diagnoses:  Cholecystitis     Merrily Pew, MD 09/17/16 2325

## 2016-09-17 NOTE — H&P (Signed)
History and Physical    Stephen Bates KPT:[id number] DOB: April 26, 1927 DOA: 09/17/2016  PCP: Asencion Noble, MD  Patient coming from:  home  Chief Complaint:  Abdominal pain  HPI: Stephen Bates is a 83 y.o. male with medical history significant of CVA, RBBB, prostate cancer, cholecystitis managed medically about a month ago due to overall very poor functional status comes in with 2 days of ruq abdominal pain associated with anorexia and nausea.  Pt is bedbound at home, and lives with his wife.  He has not been eating or drinking very well for over 2 days.  Wife reports that when he left hospital last month he was doing ok, was eating decently but then things changed again a couple of days ago.  He has been complaining of nausea, no vomiting.  No fevers, no diarrhea.  Has been very weak appearing.  Pt found to have cholecystitis again and referred for admission for further management again.    Review of Systems: As per HPI otherwise 10 point review of systems negative.   Past Medical History:  Diagnosis Date  . Arthritis   . Benign neoplasm of colon   . Chronic back pain   . Diabetes ([place])   . Frequent falls 2012  . History of right bundle branch block   . Hypertension   . Lumbar radiculopathy   . Mild dementia   . Neuropathy ([place])   . Prostate cancer ([place])   . Stroke ([place]) 1982  . Weakness of both legs   . Wears glasses     Past Surgical History:  Procedure Laterality Date  . COLONOSCOPY    . ERCP N/A 05/24/2015   Procedure: ENDOSCOPIC RETROGRADE CHOLANGIOPANCREATOGRAPHY;  Surgeon: Rogene Houston, MD;  Location: [facility]S;  Service: Endoscopy;  Laterality: N/A;  . EYE SURGERY  2010   both cataracts  . INSERTION PROSTATE RADIATION SEED  2001  . kyphoplasty    . MOHS SURGERY  12/12/2008  . SPHINCTEROTOMY N/A 05/24/2015   Procedure: BILIARY SPHINCTEROTOMY;  Surgeon: Rogene Houston, MD;  Location: [facility];  Service: Endoscopy;  Laterality: N/A;     reports that he quit smoking  about 47 years ago. He has never used smokeless tobacco. He reports that he drinks about 1.8 oz of alcohol per week . He reports that he does not use drugs.  No Known Allergies  Family History  Problem Relation Age of Onset  . Bladder Cancer Father   . Bronchopulmonary dysplasia Mother   . Colon cancer Sister     Prior to Admission medications   Medication Sig Start Date End Date Taking? Authorizing Provider  acetaminophen (TYLENOL) 325 MG tablet Take 650 mg by mouth once as needed for mild pain or moderate pain.   Yes Historical Provider, MD  aspirin EC 81 MG tablet Take 81 mg by mouth daily.   Yes Historical Provider, MD  benazepril (LOTENSIN) 40 MG tablet Take 1 tablet by mouth every morning.  10/04/13  Yes Historical Provider, MD  docusate sodium (COLACE) 100 MG capsule Take 100 mg by mouth daily as needed for mild constipation.    Yes Historical Provider, MD  metFORMIN (GLUCOPHAGE-XR) 500 MG 24 hr tablet Take 500 mg by mouth 2 (two) times daily.  08/22/15  Yes Historical Provider, MD  mirtazapine (REMERON) 30 MG tablet Take 30 mg by mouth at bedtime.   Yes Historical Provider, MD  Multiple Vitamins-Minerals (CENTRUM SILVER ADULT 50+ PO) Take 1 tablet by mouth every morning.  Yes Historical Provider, MD  polyethylene glycol (MIRALAX / GLYCOLAX) packet Take 17 g by mouth daily as needed for mild constipation.    Yes Historical Provider, MD  triamcinolone cream (KENALOG) 0.1 % Apply 1 application topically 2 (two) times daily. To head 09/09/16  Yes Historical Provider, MD    Physical Exam: Vitals:   09/17/16 2100 09/17/16 2130 09/17/16 2200 09/17/16 2230  BP: 131/66 (!) 99/49 (!) 85/52 (!) 92/48  Pulse: 92 79 77 77  Resp: 20 15 13 15   Temp:      TempSrc:      SpO2: 94% 92% 92% 95%  Weight:      Height:        Constitutional: NAD, calm, comfortable but overall very chronically ill appearing malnourished white male Vitals:   09/17/16 2100 09/17/16 2130 09/17/16 2200 09/17/16 2230    BP: 131/66 (!) 99/49 (!) 85/52 (!) 92/48  Pulse: 92 79 77 77  Resp: 20 15 13 15   Temp:      TempSrc:      SpO2: 94% 92% 92% 95%  Weight:      Height:       Eyes: PERRL, lids and conjunctivae normal ENMT: Mucous membranes are moist. Posterior pharynx clear of any exudate or lesions.Normal dentition.  Neck: normal, supple, no masses, no thyromegaly Respiratory: clear to auscultation bilaterally, no wheezing, no crackles. Normal respiratory effort. No accessory muscle use.  Cardiovascular: Regular rate and rhythm, no murmurs / rubs / gallops. No extremity edema. 2+ pedal pulses. No carotid bruits.  Abdomen: no tenderness, no masses palpated. No hepatosplenomegaly. Bowel sounds positive. Mild distended, no r/g Musculoskeletal: no clubbing / cyanosis. No joint deformity upper and lower extremities. Good ROM, no contractures. Normal muscle tone.  Skin: no rashes, lesions, ulcers. No induration Neurologic: CN 2-12 grossly intact. Sensation intact, DTR normal. overall weak Psychiatric: not agitated, slow to respond    Labs on Admission: I have personally reviewed following labs and imaging studies  CBC:  Recent Labs Lab 09/17/16 1927  WBC 27.1*  NEUTROABS 24.4*  HGB 13.9  HCT 41.4  MCV 101.2*  PLT 262   Basic Metabolic Panel:  Recent Labs Lab 09/17/16 1927  NA 126*  K 3.3*  CL 92*  CO2 22  GLUCOSE 260*  BUN 15  CREATININE 0.60*  CALCIUM 9.4   GFR: Estimated Creatinine Clearance: 46 mL/min (by C-G formula based on SCr of 0.6 mg/dL (L)). Liver Function Tests:  Recent Labs Lab 09/17/16 1927  AST 14*  ALT 13*  ALKPHOS 74  BILITOT 1.4*  PROT 7.4  ALBUMIN 3.2*    Recent Labs Lab 09/17/16 1927  LIPASE 15    Radiological Exams on Admission: Ct Abdomen Pelvis W Contrast  Result Date: 09/17/2016 CLINICAL DATA:  Right-sided abdominal pain with history of prostate cancer EXAM: CT ABDOMEN AND PELVIS WITH CONTRAST TECHNIQUE: Multidetector CT imaging of the abdomen  and pelvis was performed using the standard protocol following bolus administration of intravenous contrast. CONTRAST:  33mL ISOVUE-300 IOPAMIDOL (ISOVUE-300) INJECTION 61%, 152mL ISOVUE-300 IOPAMIDOL (ISOVUE-300) INJECTION 61% COMPARISON:  08/24/2015, 05/22/2015 FINDINGS: Lower chest: Small right-sided pleural effusion. Atelectasis at the left lung base. Partial consolidation in the right lower lobe, atelectasis versus infiltrate. Coronary artery calcifications. No large pericardial effusion. Hepatobiliary: Mild intra hepatic biliary dilatation. There are multiple calcified stones within the gallbladder lumen. There appears to be gallbladder wall thickening. There is edema and soft tissue stranding in the right upper quadrant. Extrahepatic bile duct does not  appear dilated. Pancreas: No peripancreatic inflammation. A cystic density at the pancreatic head measures 16 x 13 mm and appears grossly unchanged. Spleen: Normal in size without focal abnormality. Adrenals/Urinary Tract: Subcentimeter hypodense lesions in the kidneys, too small to further characterize. No hydronephrosis. The bladder is enlarged. Stomach/Bowel: The stomach is nondilated. No dilated small bowel. Appendix normal. Focal wall thickening and inflammation of the hepatic flexure. Moderate stool throughout the colon. Mild wall thickening of the rectum Vascular/Lymphatic: Non aneurysmal aorta. Atherosclerotic calcifications. No grossly enlarged lymph nodes. Reproductive: Multiple seed implants in the prostate. Other: No free air or free fluid. Musculoskeletal: Vertebral augmentation at T11, T12, and L1 as before. Heterogenous sclerosis and lucency within the bilateral femoral heads does not appear significantly changed. IMPRESSION: 1. Multiple calcified gallstones. There is inflammatory process in the right upper quadrant of the abdomen as evidenced by soft tissue stranding, edema, and focal wall thickening of the hepatic flexure. Suspect that the  changes are secondary to cholecystitis with reactive wall thickening of adjacent hepatic flexure. 2. Stable 16 mm cystic lesion at the head of the pancreas 3. Mild rectal wall thickening suggestive of proctitis 4. Small right-sided pleural effusion with atelectasis or infiltrate at the right lower lobe. Electronically Signed   By: Donavan Foil M.D.   On: 09/17/2016 21:44    EKG: Independently reviewed. Sinus tachy, RBBB old c/w to old, occasional PVCs Old chart reviewed  Case discussed with dr Dayna Barker  Assessment/Plan 83 yo male very debilitated chronically comes in with second episode of acute cholecystitis (1st episode a month ago responded to medical management)  Principal Problem:   Acute cholecystitis- iv zosyn.  Keep npo.  Hold anticoagulants, antiplatelet products.  General surgery called dr Arnoldo Morale who will see in am, may need IR ultimately.  Overall very poor prognosis.  Wife aware of this  Active Problems:   Hyponatremia- due to dehydration.  ivf.   RUQ abdominal pain- due to above   Mild dementia- noted   Parkinsonism ([place])- noted   History of right bundle branch block- noted   Diabetes ([place])- SSI q 4 hours while npo   Stroke [facility]) history- noted   DVT prophylaxis: scds Code Status:  Full, may need palliative care consult at some point Family Communication:  Wife at bedside Disposition Plan:  Per day team Consults called:  Dr Arnoldo Morale with general surgery called by EDP dr Dolly Rias Admission status:   admission   Wendall Isabell A MD [facility]  If 7PM-7AM, please contact night-coverage [email or web address] Password [email or web address]  09/17/2016, 11:01 PM

## 2016-09-17 NOTE — ED Triage Notes (Signed)
Patient brought in by EMS with complaint of right sided abdominal pain x 2 days.

## 2016-09-18 ENCOUNTER — Inpatient Hospital Stay (HOSPITAL_COMMUNITY): Payer: Medicare Other

## 2016-09-18 DIAGNOSIS — K810 Acute cholecystitis: Secondary | ICD-10-CM

## 2016-09-18 DIAGNOSIS — R9431 Abnormal electrocardiogram [ECG] [EKG]: Secondary | ICD-10-CM

## 2016-09-18 DIAGNOSIS — L8990 Pressure ulcer of unspecified site, unspecified stage: Secondary | ICD-10-CM | POA: Insufficient documentation

## 2016-09-18 LAB — ECHOCARDIOGRAM COMPLETE
CHL CUP MV DEC (S): 282
E decel time: 282 msec
E/e' ratio: 9.07
FS: 39 % (ref 28–44)
Height: 64 in
IV/PV OW: 0.77
LA diam end sys: 38 mm
LA vol A4C: 41.3 ml
LA vol index: 27.3 mL/m2
LADIAMINDEX: 2.26 cm/m2
LASIZE: 38 mm
LAVOL: 45.9 mL
LV e' LATERAL: 6.53 cm/s
LVEEAVG: 9.07
LVEEMED: 9.07
LVOT area: 3.46 cm2
LVOT diameter: 21 mm
Lateral S' vel: 11.5 cm/s
MVPKAVEL: 76.6 m/s
MVPKEVEL: 59.2 m/s
PW: 12.5 mm — AB (ref 0.6–1.1)
TAPSE: 14.3 mm
TDI e' lateral: 6.53
TDI e' medial: 6.31
Weight: 2232.82 oz

## 2016-09-18 LAB — GLUCOSE, CAPILLARY
GLUCOSE-CAPILLARY: 131 mg/dL — AB (ref 65–99)
GLUCOSE-CAPILLARY: 87 mg/dL (ref 65–99)
Glucose-Capillary: 125 mg/dL — ABNORMAL HIGH (ref 65–99)
Glucose-Capillary: 96 mg/dL (ref 65–99)

## 2016-09-18 LAB — BASIC METABOLIC PANEL
Anion gap: 7 (ref 5–15)
BUN: 13 mg/dL (ref 6–20)
CHLORIDE: 96 mmol/L — AB (ref 101–111)
CO2: 27 mmol/L (ref 22–32)
Calcium: 8.7 mg/dL — ABNORMAL LOW (ref 8.9–10.3)
Creatinine, Ser: 0.66 mg/dL (ref 0.61–1.24)
GFR calc Af Amer: >60 mL/min (ref >60)
GFR calc non Af Amer: >60 mL/min (ref >60)
GLUCOSE: 157 mg/dL — AB (ref 65–99)
POTASSIUM: 3.5 mmol/L (ref 3.5–5.1)
Sodium: 130 mmol/L — ABNORMAL LOW (ref 135–145)

## 2016-09-18 LAB — CBC
HEMATOCRIT: 39.5 % (ref 39.0–52.0)
Hemoglobin: 13.1 g/dL (ref 13.0–17.0)
MCH: 34.1 pg — ABNORMAL HIGH (ref 26.0–34.0)
MCHC: 33.2 g/dL (ref 30.0–36.0)
MCV: 102.9 fL — AB (ref 78.0–100.0)
Platelets: 254 10*3/uL (ref 150–400)
RBC: 3.84 MIL/uL — ABNORMAL LOW (ref 4.22–5.81)
RDW: 13.2 % (ref 11.5–15.5)
WBC: 22.4 10*3/uL — ABNORMAL HIGH (ref 4.0–10.5)

## 2016-09-18 LAB — CBG MONITORING, ED
Glucose-Capillary: 162 mg/dL — ABNORMAL HIGH (ref 65–99)
Glucose-Capillary: 146 mg/dL — ABNORMAL HIGH (ref 65–99)

## 2016-09-18 MED ORDER — PIPERACILLIN-TAZOBACTAM 3.375 G IVPB
3.3750 g | Freq: Three times a day (TID) | INTRAVENOUS | Status: DC
  Administered 2016-09-18 – 2016-09-23 (×18): 3.375 g via INTRAVENOUS
  Filled 2016-09-18 (×17): qty 50

## 2016-09-18 MED ORDER — POTASSIUM CHLORIDE 2 MEQ/ML IV SOLN: 30.0000 meq | Freq: Once | INTRAVENOUS | Status: DC

## 2016-09-18 MED ORDER — POTASSIUM CHLORIDE 10 MEQ/100ML IV SOLN
10.0000 meq | INTRAVENOUS | Status: AC
  Administered 2016-09-18 (×3): 10 meq via INTRAVENOUS
  Filled 2016-09-18: qty 100

## 2016-09-18 MED ORDER — SODIUM CHLORIDE 0.9 % IV SOLN
INTRAVENOUS | Status: AC
  Administered 2016-09-18: 03:00:00 via INTRAVENOUS

## 2016-09-18 MED ORDER — PRO-STAT SUGAR FREE PO LIQD
30.0000 mL | Freq: Two times a day (BID) | ORAL | Status: DC
  Administered 2016-09-19 – 2016-09-24 (×8): 30 mL via ORAL
  Filled 2016-09-18 (×8): qty 30

## 2016-09-18 MED ORDER — SODIUM CHLORIDE 0.9 % IV SOLN
INTRAVENOUS | Status: AC
  Administered 2016-09-18: 15:00:00 via INTRAVENOUS

## 2016-09-18 MED ORDER — MORPHINE SULFATE (PF) 4 MG/ML IV SOLN
4.0000 mg | INTRAVENOUS | Status: DC | PRN
  Administered 2016-09-19 – 2016-09-22 (×3): 4 mg via INTRAVENOUS
  Filled 2016-09-18 (×4): qty 1

## 2016-09-18 MED ORDER — ONDANSETRON HCL 4 MG/2ML IJ SOLN: 4.0000 mg | Freq: Four times a day (QID) | INTRAMUSCULAR | Status: DC | PRN

## 2016-09-18 MED ORDER — BOOST / RESOURCE BREEZE PO LIQD
1.0000 | Freq: Two times a day (BID) | ORAL | Status: DC
  Administered 2016-09-19 – 2016-09-23 (×6): 1 via ORAL

## 2016-09-18 MED ORDER — INSULIN ASPART 100 UNIT/ML ~~LOC~~ SOLN
0.0000 [IU] | SUBCUTANEOUS | Status: DC
  Administered 2016-09-18: 1 [IU] via SUBCUTANEOUS
  Administered 2016-09-19: 3 [IU] via SUBCUTANEOUS
  Administered 2016-09-19: 5 [IU] via SUBCUTANEOUS
  Administered 2016-09-19 – 2016-09-20 (×2): 7 [IU] via SUBCUTANEOUS
  Administered 2016-09-20: 5 [IU] via SUBCUTANEOUS
  Administered 2016-09-20: 4 [IU] via SUBCUTANEOUS
  Administered 2016-09-20 – 2016-09-21 (×2): 2 [IU] via SUBCUTANEOUS
  Administered 2016-09-21 (×2): 5 [IU] via SUBCUTANEOUS
  Administered 2016-09-21 (×2): 2 [IU] via SUBCUTANEOUS
  Administered 2016-09-21: 3 [IU] via SUBCUTANEOUS
  Administered 2016-09-22: 2 [IU] via SUBCUTANEOUS
  Administered 2016-09-22 (×2): 1 [IU] via SUBCUTANEOUS
  Administered 2016-09-23: 2 [IU] via SUBCUTANEOUS
  Administered 2016-09-23: 1 [IU] via SUBCUTANEOUS
  Administered 2016-09-23: 2 [IU] via SUBCUTANEOUS
  Administered 2016-09-23 – 2016-09-24 (×2): 5 [IU] via SUBCUTANEOUS

## 2016-09-18 MED ORDER — CHLORHEXIDINE GLUCONATE 0.12 % MT SOLN
15.0000 mL | Freq: Two times a day (BID) | OROMUCOSAL | Status: DC
  Administered 2016-09-18 – 2016-09-23 (×12): 15 mL via OROMUCOSAL
  Filled 2016-09-18 (×11): qty 15

## 2016-09-18 MED ORDER — ONDANSETRON HCL 4 MG PO TABS
4.0000 mg | ORAL_TABLET | Freq: Four times a day (QID) | ORAL | Status: DC | PRN
  Administered 2016-09-23: 4 mg via ORAL
  Filled 2016-09-18: qty 1

## 2016-09-18 MED ORDER — ORAL CARE MOUTH RINSE
15.0000 mL | Freq: Two times a day (BID) | OROMUCOSAL | Status: DC
Start: 2016-09-18 — End: 2016-09-24
  Administered 2016-09-18 – 2016-09-23 (×7): 15 mL via OROMUCOSAL

## 2016-09-18 NOTE — ED Notes (Signed)
Pt's O2 sat in upper 80's; pt placed on 4L O2 by [place] and sats increased to upper 90's

## 2016-09-18 NOTE — Progress Notes (Signed)
Initial Nutrition Assessment  DOCUMENTATION CODES:  Not applicable  INTERVENTION:  Boost Breeze po BID, each supplement provides 250 kcal and 9 grams of protein  Will order 30 mL Prostat BID, each supplement provides 100 kcal and 15 grams of protein.  Pt reports history of severe constipation, would recommend starting bowel regimen-reliant on prune juice + prunes  NUTRITION DIAGNOSIS:  Inadequate oral intake related to inability to eat, nausea, acute illness (cholecystitis), poor appetite as evidenced by per patient/family report of pt eating almost nothing x 2 days  GOAL:  Patient will meet greater than or equal to 90% of their needs  MONITOR:  PO intake, Supplement acceptance, Diet advancement, Labs  REASON FOR ASSESSMENT:  Malnutrition Screening Tool    ASSESSMENT:  83 y/o male PMHx CVA, RBBB, Prostate Cancer, cholecystitis, CVA, DM, HTN. Presented with 2 days abdominal pain, anorexia, nausea. Had not bee eating/drinking well x 2 days. Worked up for recurrent cholecystitis.   Encounter from late morning  Pt's wife is at bedside. She states that for the last few days, the pt has minimally Eaten. This sounded to be less than 25% of his normal as she reports having trouble getting him just to drink water.   At baseline, the patient eats 2 meals per day and drinks an atkins shake. To stay regular He will eat 5 prunes each morning. Additionally, he drinks prune juice,Tomato juice and Orange juice each breakfast. He took a multivitamin daily. Pt is a diabetic. He says his BG is typically 120 , but recently has been >190.   Pt says his UBW is about 125 lbs. He appears to have gained a substantial amount of weight recently.   At this time, pt was agreeable to Boost breeze or Prostat once diet is advanced to Clear liquids.   Physical Exam: Pt has been bed bound since 2014. As such, his lower body exhibits minimal muscle mass, however his upper body is well nourished.   Labs:BG  120-160, WBC:22.4, Albumin:3.2,  Medications:  IV abx, IVF   Recent Labs Lab 09/17/16 1927 09/18/16 0509  NA 126* 130*  K 3.3* 3.5  CL 92* 96*  CO2 22 27  BUN 15 13  CREATININE 0.60* 0.66  CALCIUM 9.4 8.7*  GLUCOSE 260* 157*   Diet Order:  Diet NPO time specified  Skin: PU stage 1 to sacrum  Last BM:  3/15  Height:  Ht Readings from Last 1 Encounters:  09/18/16 5\' 4"  (1.626 m)   Weight:  Wt Readings from Last 1 Encounters:  09/18/16 139 lb 8.8 oz (63.3 kg)   Wt Readings from Last 10 Encounters:  09/18/16 139 lb 8.8 oz (63.3 kg)  08/26/15 126 lb 4.8 oz (57.3 kg)  05/25/15 124 lb 14.4 oz (56.7 kg)  03/28/14 122 lb (55.3 kg)  03/02/14 122 lb 3.2 oz (55.4 kg)  02/03/14 123 lb (55.8 kg)  01/03/14 127 lb 6.8 oz (57.8 kg)  12/29/13 139 lb (63 kg)  10/18/13 139 lb 8 oz (63.3 kg)  06/08/13 140 lb (63.5 kg)   Ideal Body Weight:  59.1 kg  BMI:  Body mass index is 23.95 kg/m.  Estimated Nutritional Needs:  Kcal:  1600-1800 (25-28 kcal/kg bw) Protein:  75-90 (1.2-1.4 g/kg bw) Fluid:  >1.6 L (25 ml/kg bw)  EDUCATION NEEDS:  No education needs identified at this time  Burtis Junes RD, LDN, [place] Clinical Nutrition Pager: [phone] 09/18/2016 4:59 PM

## 2016-09-18 NOTE — Progress Notes (Signed)
ANTIBIOTIC CONSULT NOTE-Preliminary  Pharmacy Consult for zosyn Indication: intra-abdominal infection  No Known Allergies  Patient Measurements: Height: 5' (152.4 cm) Weight: 125 lb (56.7 kg) IBW/kg (Calculated) : 50 Adjusted Body Weight:   Vital Signs: Temp: 99 F (37.2 C) (03/14 1906) Temp Source: Oral (03/14 1906) BP: 92/48 (03/15 0056) Pulse Rate: 71 (03/14 2300)  Labs:  Recent Labs  09/17/16 1927  WBC 27.1*  HGB 13.9  PLT 275  CREATININE 0.60*    Estimated Creatinine Clearance: 46 mL/min (by C-G formula based on SCr of 0.6 mg/dL (L)).  No results for input(s): VANCOTROUGH, VANCOPEAK, VANCORANDOM, GENTTROUGH, GENTPEAK, GENTRANDOM, TOBRATROUGH, TOBRAPEAK, TOBRARND, AMIKACINPEAK, AMIKACINTROU, AMIKACIN in the last 72 hours.   Microbiology: No results found for this or any previous visit (from the past 720 hour(s)).  Medical History: Past Medical History:  Diagnosis Date  . Arthritis   . Benign neoplasm of colon   . Chronic back pain   . Diabetes ([place])   . Frequent falls 2012  . History of right bundle branch block   . Hypertension   . Lumbar radiculopathy   . Mild dementia   . Neuropathy ([place])   . Prostate cancer ([place])   . Stroke ([place]) 1982  . Weakness of both legs   . Wears glasses     Medications:  Scheduled:  . insulin aspart  0-9 Units Subcutaneous Q4H   Infusions:  . sodium chloride    . piperacillin-tazobactam (ZOSYN)  IV     PRN: morphine injection, ondansetron **OR** ondansetron (ZOFRAN) IV Anti-infectives    Start     Dose/Rate Route Frequency Ordered Stop   09/18/16 0700  piperacillin-tazobactam (ZOSYN) IVPB 3.375 g     3.375 g 12.5 mL/hr over 240 Minutes Intravenous Every 8 hours 09/18/16 0114     09/17/16 2215  piperacillin-tazobactam (ZOSYN) IVPB 3.375 g     3.375 g 100 mL/hr over 30 Minutes Intravenous  Once 09/17/16 2207 09/17/16 2309      Assessment: 83 yo male with PMH CVA, RBBB, prostate cancer and cholecystitis a month  ago.  Admitted for acute cholecystitis starting zosyn.    Plan:  Preliminary review of pertinent patient information completed.  Protocol will be initiated with dose(s) of zosyn 3.375grams Q8 hours.  Forestine Na clinical pharmacist will complete review during morning rounds to assess patient and finalize treatment regimen if needed.  Tamelia Michalowski, [place], RPH 09/18/2016,1:15 AM

## 2016-09-18 NOTE — Consult Note (Signed)
Reason for Consult: Cholecystitis, cholelithiasis Referring Physician: Dr. Asencion Noble  Stephen Bates is an 83 y.o. male.  HPI: Patient is an 83 year old white male with multiple medical problems who I have seen in the past for cholecystitis cholelithiasis who presented with right upper quadrant abdominal pain. He is bed bound at home. He had not been eating well for 2 days. On workup in the emergency room, he was found to have acute cholecystitis with cholelithiasis, hyponatremia. He was admitted to the ICU for further management treatment. This morning, he right upper quadrant abdominal pain. History is limited secondary to a stroke.  Past Medical History:  Diagnosis Date  . Arthritis   . Benign neoplasm of colon   . Chronic back pain   . Diabetes ([place])   . Frequent falls 2012  . History of right bundle branch block   . Hypertension   . Lumbar radiculopathy   . Mild dementia   . Neuropathy ([place])   . Prostate cancer ([place])   . Stroke ([place]) 1982  . Weakness of both legs   . Wears glasses     Past Surgical History:  Procedure Laterality Date  . COLONOSCOPY    . ERCP N/A 05/24/2015   Procedure: ENDOSCOPIC RETROGRADE CHOLANGIOPANCREATOGRAPHY;  Surgeon: Rogene Houston, MD;  Location: [facility];  Service: Endoscopy;  Laterality: N/A;  . EYE SURGERY  2010   both cataracts  . INSERTION PROSTATE RADIATION SEED  2001  . kyphoplasty    . MOHS SURGERY  12/12/2008  . SPHINCTEROTOMY N/A 05/24/2015   Procedure: BILIARY SPHINCTEROTOMY;  Surgeon: Rogene Houston, MD;  Location: [facility];  Service: Endoscopy;  Laterality: N/A;    Family History  Problem Relation Age of Onset  . Bladder Cancer Father   . Bronchopulmonary dysplasia Mother   . Colon cancer Sister     Social History:  reports that he quit smoking about 47 years ago. He has never used smokeless tobacco. He reports that he drinks about 1.8 oz of alcohol per week . He reports that he does not use drugs.  Allergies: No Known  Allergies  Medications:  Prior to Admission:  Prescriptions Prior to Admission  Medication Sig Dispense Refill Last Dose  . acetaminophen (TYLENOL) 325 MG tablet Take 650 mg by mouth once as needed for mild pain or moderate pain.   09/17/2016 at 1700  . aspirin EC 81 MG tablet Take 81 mg by mouth daily.   09/17/2016 at Unknown time  . benazepril (LOTENSIN) 40 MG tablet Take 1 tablet by mouth every morning.    09/17/2016 at Unknown time  . docusate sodium (COLACE) 100 MG capsule Take 100 mg by mouth daily as needed for mild constipation.    Past Week at Unknown time  . metFORMIN (GLUCOPHAGE-XR) 500 MG 24 hr tablet Take 500 mg by mouth 2 (two) times daily.   11 09/17/2016 at Unknown time  . mirtazapine (REMERON) 30 MG tablet Take 30 mg by mouth at bedtime.   09/16/2016 at Unknown time  . Multiple Vitamins-Minerals (CENTRUM SILVER ADULT 50+ PO) Take 1 tablet by mouth every morning.   09/17/2016 at Unknown time  . polyethylene glycol (MIRALAX / GLYCOLAX) packet Take 17 g by mouth daily as needed for mild constipation.    unknown  . triamcinolone cream (KENALOG) 0.1 % Apply 1 application topically 2 (two) times daily. To head  0 09/17/2016 at Unknown time   Scheduled: . insulin aspart  0-9 Units Subcutaneous Q4H  .  piperacillin-tazobactam (ZOSYN)  IV  3.375 g Intravenous Q8H  . potassium chloride  10 mEq Intravenous Q1 Hr x 3    Results for orders placed or performed during the hospital encounter of 09/17/16 (from the past 48 hour(s))  CBC with Differential     Status: Abnormal   Collection Time: 09/17/16  7:27 PM  Result Value Ref Range   WBC 27.1 (H) 4.0 - 10.5 K/uL   RBC 4.09 (L) 4.22 - 5.81 MIL/uL   Hemoglobin 13.9 13.0 - 17.0 g/dL   HCT 41.4 39.0 - 52.0 %   MCV 101.2 (H) 78.0 - 100.0 fL   MCH 34.0 26.0 - 34.0 pg   MCHC 33.6 30.0 - 36.0 g/dL   RDW 12.9 11.5 - 15.5 %   Platelets 275 150 - 400 K/uL   Neutrophils Relative % 90 %   Neutro Abs 24.4 (H) 1.7 - 7.7 K/uL   Lymphocytes Relative 3  %   Lymphs Abs 0.8 0.7 - 4.0 K/uL   Monocytes Relative 7 %   Monocytes Absolute 1.9 (H) 0.1 - 1.0 K/uL   Eosinophils Relative 0 %   Eosinophils Absolute 0.0 0.0 - 0.7 K/uL   Basophils Relative 0 %   Basophils Absolute 0.0 0.0 - 0.1 K/uL  Comprehensive metabolic panel     Status: Abnormal   Collection Time: 09/17/16  7:27 PM  Result Value Ref Range   Sodium 126 (L) 135 - 145 mmol/L   Potassium 3.3 (L) 3.5 - 5.1 mmol/L   Chloride 92 (L) 101 - 111 mmol/L   CO2 22 22 - 32 mmol/L   Glucose, Bld 260 (H) 65 - 99 mg/dL   BUN 15 6 - 20 mg/dL   Creatinine, Ser 0.60 (L) 0.61 - 1.24 mg/dL   Calcium 9.4 8.9 - 10.3 mg/dL   Total Protein 7.4 6.5 - 8.1 g/dL   Albumin 3.2 (L) 3.5 - 5.0 g/dL   AST 14 (L) 15 - 41 U/L   ALT 13 (L) 17 - 63 U/L   Alkaline Phosphatase 74 38 - 126 U/L   Total Bilirubin 1.4 (H) 0.3 - 1.2 mg/dL   GFR calc non Af Amer >60 >60 mL/min   GFR calc Af Amer >60 >60 mL/min    Comment: (NOTE) The eGFR has been calculated using the CKD EPI equation. This calculation has not been validated in all clinical situations. eGFR's persistently <60 mL/min signify possible Chronic Kidney Disease.    Anion gap 12 5 - 15  Lipase, blood     Status: None   Collection Time: 09/17/16  7:27 PM  Result Value Ref Range   Lipase 15 11 - 51 U/L  Lactic acid, plasma     Status: None   Collection Time: 09/17/16  7:29 PM  Result Value Ref Range   Lactic Acid, Venous 1.3 0.5 - 1.9 mmol/L  CBG monitoring, ED     Status: Abnormal   Collection Time: 09/18/16  2:53 AM  Result Value Ref Range   Glucose-Capillary 162 (H) 65 - 99 mg/dL   Comment 1 Notify RN    Comment 2 Document in Chart   CBG monitoring, ED     Status: Abnormal   Collection Time: 09/18/16  4:32 AM  Result Value Ref Range   Glucose-Capillary 146 (H) 65 - 99 mg/dL   Comment 1 Notify RN    Comment 2 Document in Chart   Basic metabolic panel     Status: Abnormal   Collection Time:  09/18/16  5:09 AM  Result Value Ref Range    Sodium 130 (L) 135 - 145 mmol/L   Potassium 3.5 3.5 - 5.1 mmol/L   Chloride 96 (L) 101 - 111 mmol/L   CO2 27 22 - 32 mmol/L   Glucose, Bld 157 (H) 65 - 99 mg/dL   BUN 13 6 - 20 mg/dL   Creatinine, Ser 0.66 0.61 - 1.24 mg/dL   Calcium 8.7 (L) 8.9 - 10.3 mg/dL   GFR calc non Af Amer >60 >60 mL/min   GFR calc Af Amer >60 >60 mL/min    Comment: (NOTE) The eGFR has been calculated using the CKD EPI equation. This calculation has not been validated in all clinical situations. eGFR's persistently <60 mL/min signify possible Chronic Kidney Disease.    Anion gap 7 5 - 15  CBC     Status: Abnormal   Collection Time: 09/18/16  5:09 AM  Result Value Ref Range   WBC 22.4 (H) 4.0 - 10.5 K/uL   RBC 3.84 (L) 4.22 - 5.81 MIL/uL   Hemoglobin 13.1 13.0 - 17.0 g/dL   HCT 39.5 39.0 - 52.0 %   MCV 102.9 (H) 78.0 - 100.0 fL   MCH 34.1 (H) 26.0 - 34.0 pg   MCHC 33.2 30.0 - 36.0 g/dL   RDW 13.2 11.5 - 15.5 %   Platelets 254 150 - 400 K/uL  Glucose, capillary     Status: Abnormal   Collection Time: 09/18/16  7:45 AM  Result Value Ref Range   Glucose-Capillary 131 (H) 65 - 99 mg/dL    Ct Abdomen Pelvis W Contrast  Result Date: 09/17/2016 CLINICAL DATA:  Right-sided abdominal pain with history of prostate cancer EXAM: CT ABDOMEN AND PELVIS WITH CONTRAST TECHNIQUE: Multidetector CT imaging of the abdomen and pelvis was performed using the standard protocol following bolus administration of intravenous contrast. CONTRAST:  49m ISOVUE-300 IOPAMIDOL (ISOVUE-300) INJECTION 61%, 1079mISOVUE-300 IOPAMIDOL (ISOVUE-300) INJECTION 61% COMPARISON:  08/24/2015, 05/22/2015 FINDINGS: Lower chest: Small right-sided pleural effusion. Atelectasis at the left lung base. Partial consolidation in the right lower lobe, atelectasis versus infiltrate. Coronary artery calcifications. No large pericardial effusion. Hepatobiliary: Mild intra hepatic biliary dilatation. There are multiple calcified stones within the gallbladder  lumen. There appears to be gallbladder wall thickening. There is edema and soft tissue stranding in the right upper quadrant. Extrahepatic bile duct does not appear dilated. Pancreas: No peripancreatic inflammation. A cystic density at the pancreatic head measures 16 x 13 mm and appears grossly unchanged. Spleen: Normal in size without focal abnormality. Adrenals/Urinary Tract: Subcentimeter hypodense lesions in the kidneys, too small to further characterize. No hydronephrosis. The bladder is enlarged. Stomach/Bowel: The stomach is nondilated. No dilated small bowel. Appendix normal. Focal wall thickening and inflammation of the hepatic flexure. Moderate stool throughout the colon. Mild wall thickening of the rectum Vascular/Lymphatic: Non aneurysmal aorta. Atherosclerotic calcifications. No grossly enlarged lymph nodes. Reproductive: Multiple seed implants in the prostate. Other: No free air or free fluid. Musculoskeletal: Vertebral augmentation at T11, T12, and L1 as before. Heterogenous sclerosis and lucency within the bilateral femoral heads does not appear significantly changed. IMPRESSION: 1. Multiple calcified gallstones. There is inflammatory process in the right upper quadrant of the abdomen as evidenced by soft tissue stranding, edema, and focal wall thickening of the hepatic flexure. Suspect that the changes are secondary to cholecystitis with reactive wall thickening of adjacent hepatic flexure. 2. Stable 16 mm cystic lesion at the head of the pancreas 3. Mild rectal  wall thickening suggestive of proctitis 4. Small right-sided pleural effusion with atelectasis or infiltrate at the right lower lobe. Electronically Signed   By: Donavan Foil M.D.   On: 09/17/2016 21:44    ROS:  Review of systems not obtained due to patient factors.  Blood pressure (!) 113/59, pulse 65, temperature 98 F (36.7 C), temperature source Axillary, resp. rate 19, height 5' 4"  (1.626 m), weight 139 lb 8.8 oz (63.3 kg), SpO2  97 %. Physical Exam: Pleasant white male in no acute distress. Head is normocephalic, but multiple bandages are on his head due to skin lesions. Eyes are without scleral icterus Lungs clear auscultation with equal breath sounds bilaterally. Heart examination reveals a regular rhythm with skip beats. Possible to 6 systolic ejection murmur Abdomen is soft but rotund. Skin lesions are present along the left aspect of the abdomen. No tenderness is elicited. CT scan report reviewed H&P reviewed  Assessment/Plan: Impression: Recurrent cholecystitis, cholelithiasis, hyponatremia, right bundle branch block, history of CVA. Leukocytosis is improving. His hyponatremia was resolving. Will get 2-D echo of heart. Continue current management. Will discuss with Dr. Willey Blade.  Aviva Signs 09/18/2016, 8:26 AM

## 2016-09-18 NOTE — Progress Notes (Signed)
*  PRELIMINARY RESULTS* Echocardiogram 2D Echocardiogram has been performed.  Leavy Cella 09/18/2016, 9:33 AM

## 2016-09-18 NOTE — Progress Notes (Signed)
Subjective: [name] was admitted yesterday with acute cholecystitis. He states he is feeling better today. He is not feeling pain at present. He had nausea yesterday but no vomiting. He has been symptomatic for 3 days. He last had a bowel movement yesterday. He denies fever.  Objective: Vital signs in last 24 hours: Vitals:   09/18/16 0457 09/18/16 0500 09/18/16 0600 09/18/16 0700  BP:  (!) 108/54 (!) 90/53 (!) 113/59  Pulse:  79 74 65  Resp:  17 15 19   Temp: 98.5 F (36.9 C)     TempSrc: Oral     SpO2:  96% 97% 97%  Weight: 139 lb 8.8 oz (63.3 kg)     Height: 5\' 4"  (1.626 m)      Weight change:   Intake/Output Summary (Last 24 hours) at 09/18/16 0747 Last data filed at 09/18/16 0700  Gross per 24 hour  Intake             1550 ml  Output                0 ml  Net             1550 ml    Physical Exam: Alert. No distress. Lungs clear. Heart regular with no murmurs at approximate 70 bpm on telemetry. Abdomen is distended. Bowel sounds are absent. No palpable organomegaly. No significant abdominal tenderness now. Extremities reveal no edema. He has multiple dressings on his skin including on his head and on his abdomen from recent dermatologic procedures.  Lab Results:    Results for orders placed or performed during the hospital encounter of 09/17/16 (from the past 24 hour(s))  CBC with Differential     Status: Abnormal   Collection Time: 09/17/16  7:27 PM  Result Value Ref Range   WBC 27.1 (H) 4.0 - 10.5 K/uL   RBC 4.09 (L) 4.22 - 5.81 MIL/uL   Hemoglobin 13.9 13.0 - 17.0 g/dL   HCT 41.4 39.0 - 52.0 %   MCV 101.2 (H) 78.0 - 100.0 fL   MCH 34.0 26.0 - 34.0 pg   MCHC 33.6 30.0 - 36.0 g/dL   RDW 12.9 11.5 - 15.5 %   Platelets 275 150 - 400 K/uL   Neutrophils Relative % 90 %   Neutro Abs 24.4 (H) 1.7 - 7.7 K/uL   Lymphocytes Relative 3 %   Lymphs Abs 0.8 0.7 - 4.0 K/uL   Monocytes Relative 7 %   Monocytes Absolute 1.9 (H) 0.1 - 1.0 K/uL   Eosinophils Relative 0 %    Eosinophils Absolute 0.0 0.0 - 0.7 K/uL   Basophils Relative 0 %   Basophils Absolute 0.0 0.0 - 0.1 K/uL  Comprehensive metabolic panel     Status: Abnormal   Collection Time: 09/17/16  7:27 PM  Result Value Ref Range   Sodium 126 (L) 135 - 145 mmol/L   Potassium 3.3 (L) 3.5 - 5.1 mmol/L   Chloride 92 (L) 101 - 111 mmol/L   CO2 22 22 - 32 mmol/L   Glucose, Bld 260 (H) 65 - 99 mg/dL   BUN 15 6 - 20 mg/dL   Creatinine, Ser 0.60 (L) 0.61 - 1.24 mg/dL   Calcium 9.4 8.9 - 10.3 mg/dL   Total Protein 7.4 6.5 - 8.1 g/dL   Albumin 3.2 (L) 3.5 - 5.0 g/dL   AST 14 (L) 15 - 41 U/L   ALT 13 (L) 17 - 63 U/L   Alkaline Phosphatase 74 38 -  126 U/L   Total Bilirubin 1.4 (H) 0.3 - 1.2 mg/dL   GFR calc non Af Amer >60 >60 mL/min   GFR calc Af Amer >60 >60 mL/min   Anion gap 12 5 - 15  Lipase, blood     Status: None   Collection Time: 09/17/16  7:27 PM  Result Value Ref Range   Lipase 15 11 - 51 U/L  Lactic acid, plasma     Status: None   Collection Time: 09/17/16  7:29 PM  Result Value Ref Range   Lactic Acid, Venous 1.3 0.5 - 1.9 mmol/L  CBG monitoring, ED     Status: Abnormal   Collection Time: 09/18/16  2:53 AM  Result Value Ref Range   Glucose-Capillary 162 (H) 65 - 99 mg/dL   Comment 1 Notify RN    Comment 2 Document in Chart   CBG monitoring, ED     Status: Abnormal   Collection Time: 09/18/16  4:32 AM  Result Value Ref Range   Glucose-Capillary 146 (H) 65 - 99 mg/dL   Comment 1 Notify RN    Comment 2 Document in Chart   Basic metabolic panel     Status: Abnormal   Collection Time: 09/18/16  5:09 AM  Result Value Ref Range   Sodium 130 (L) 135 - 145 mmol/L   Potassium 3.5 3.5 - 5.1 mmol/L   Chloride 96 (L) 101 - 111 mmol/L   CO2 27 22 - 32 mmol/L   Glucose, Bld 157 (H) 65 - 99 mg/dL   BUN 13 6 - 20 mg/dL   Creatinine, Ser 0.66 0.61 - 1.24 mg/dL   Calcium 8.7 (L) 8.9 - 10.3 mg/dL   GFR calc non Af Amer >60 >60 mL/min   GFR calc Af Amer >60 >60 mL/min   Anion gap 7 5 - 15   CBC     Status: Abnormal   Collection Time: 09/18/16  5:09 AM  Result Value Ref Range   WBC 22.4 (H) 4.0 - 10.5 K/uL   RBC 3.84 (L) 4.22 - 5.81 MIL/uL   Hemoglobin 13.1 13.0 - 17.0 g/dL   HCT 39.5 39.0 - 52.0 %   MCV 102.9 (H) 78.0 - 100.0 fL   MCH 34.1 (H) 26.0 - 34.0 pg   MCHC 33.2 30.0 - 36.0 g/dL   RDW 13.2 11.5 - 15.5 %   Platelets 254 150 - 400 K/uL  Glucose, capillary     Status: Abnormal   Collection Time: 09/18/16  7:45 AM  Result Value Ref Range   Glucose-Capillary 131 (H) 65 - 99 mg/dL     ABGS No results for input(s): PHART, PO2ART, TCO2, HCO3 in the last 72 hours.  Invalid input(s): PCO2 CULTURES No results found for this or any previous visit (from the past 240 hour(s)). Studies/Results: Ct Abdomen Pelvis W Contrast  Result Date: 09/17/2016 CLINICAL DATA:  Right-sided abdominal pain with history of prostate cancer EXAM: CT ABDOMEN AND PELVIS WITH CONTRAST TECHNIQUE: Multidetector CT imaging of the abdomen and pelvis was performed using the standard protocol following bolus administration of intravenous contrast. CONTRAST:  42mL ISOVUE-300 IOPAMIDOL (ISOVUE-300) INJECTION 61%, 149mL ISOVUE-300 IOPAMIDOL (ISOVUE-300) INJECTION 61% COMPARISON:  08/24/2015, 05/22/2015 FINDINGS: Lower chest: Small right-sided pleural effusion. Atelectasis at the left lung base. Partial consolidation in the right lower lobe, atelectasis versus infiltrate. Coronary artery calcifications. No large pericardial effusion. Hepatobiliary: Mild intra hepatic biliary dilatation. There are multiple calcified stones within the gallbladder lumen. There appears to be gallbladder wall  thickening. There is edema and soft tissue stranding in the right upper quadrant. Extrahepatic bile duct does not appear dilated. Pancreas: No peripancreatic inflammation. A cystic density at the pancreatic head measures 16 x 13 mm and appears grossly unchanged. Spleen: Normal in size without focal abnormality. Adrenals/Urinary  Tract: Subcentimeter hypodense lesions in the kidneys, too small to further characterize. No hydronephrosis. The bladder is enlarged. Stomach/Bowel: The stomach is nondilated. No dilated small bowel. Appendix normal. Focal wall thickening and inflammation of the hepatic flexure. Moderate stool throughout the colon. Mild wall thickening of the rectum Vascular/Lymphatic: Non aneurysmal aorta. Atherosclerotic calcifications. No grossly enlarged lymph nodes. Reproductive: Multiple seed implants in the prostate. Other: No free air or free fluid. Musculoskeletal: Vertebral augmentation at T11, T12, and L1 as before. Heterogenous sclerosis and lucency within the bilateral femoral heads does not appear significantly changed. IMPRESSION: 1. Multiple calcified gallstones. There is inflammatory process in the right upper quadrant of the abdomen as evidenced by soft tissue stranding, edema, and focal wall thickening of the hepatic flexure. Suspect that the changes are secondary to cholecystitis with reactive wall thickening of adjacent hepatic flexure. 2. Stable 16 mm cystic lesion at the head of the pancreas 3. Mild rectal wall thickening suggestive of proctitis 4. Small right-sided pleural effusion with atelectasis or infiltrate at the right lower lobe. Electronically Signed   By: Donavan Foil M.D.   On: 09/17/2016 21:44   Micro Results: No results found for this or any previous visit (from the past 240 hour(s)). Studies/Results: Ct Abdomen Pelvis W Contrast  Result Date: 09/17/2016 CLINICAL DATA:  Right-sided abdominal pain with history of prostate cancer EXAM: CT ABDOMEN AND PELVIS WITH CONTRAST TECHNIQUE: Multidetector CT imaging of the abdomen and pelvis was performed using the standard protocol following bolus administration of intravenous contrast. CONTRAST:  41mL ISOVUE-300 IOPAMIDOL (ISOVUE-300) INJECTION 61%, 121mL ISOVUE-300 IOPAMIDOL (ISOVUE-300) INJECTION 61% COMPARISON:  08/24/2015, 05/22/2015 FINDINGS:  Lower chest: Small right-sided pleural effusion. Atelectasis at the left lung base. Partial consolidation in the right lower lobe, atelectasis versus infiltrate. Coronary artery calcifications. No large pericardial effusion. Hepatobiliary: Mild intra hepatic biliary dilatation. There are multiple calcified stones within the gallbladder lumen. There appears to be gallbladder wall thickening. There is edema and soft tissue stranding in the right upper quadrant. Extrahepatic bile duct does not appear dilated. Pancreas: No peripancreatic inflammation. A cystic density at the pancreatic head measures 16 x 13 mm and appears grossly unchanged. Spleen: Normal in size without focal abnormality. Adrenals/Urinary Tract: Subcentimeter hypodense lesions in the kidneys, too small to further characterize. No hydronephrosis. The bladder is enlarged. Stomach/Bowel: The stomach is nondilated. No dilated small bowel. Appendix normal. Focal wall thickening and inflammation of the hepatic flexure. Moderate stool throughout the colon. Mild wall thickening of the rectum Vascular/Lymphatic: Non aneurysmal aorta. Atherosclerotic calcifications. No grossly enlarged lymph nodes. Reproductive: Multiple seed implants in the prostate. Other: No free air or free fluid. Musculoskeletal: Vertebral augmentation at T11, T12, and L1 as before. Heterogenous sclerosis and lucency within the bilateral femoral heads does not appear significantly changed. IMPRESSION: 1. Multiple calcified gallstones. There is inflammatory process in the right upper quadrant of the abdomen as evidenced by soft tissue stranding, edema, and focal wall thickening of the hepatic flexure. Suspect that the changes are secondary to cholecystitis with reactive wall thickening of adjacent hepatic flexure. 2. Stable 16 mm cystic lesion at the head of the pancreas 3. Mild rectal wall thickening suggestive of proctitis 4. Small right-sided pleural effusion with  atelectasis or  infiltrate at the right lower lobe. Electronically Signed   By: Donavan Foil M.D.   On: 09/17/2016 21:44   Medications:  I have reviewed the patient's current medications Scheduled Meds: . insulin aspart  0-9 Units Subcutaneous Q4H  . piperacillin-tazobactam (ZOSYN)  IV  3.375 g Intravenous Q8H   Continuous Infusions: . sodium chloride 100 mL/hr at 09/18/16 0256   PRN Meds:.morphine injection, ondansetron **OR** ondansetron (ZOFRAN) IV   Assessment/Plan: #1. Acute cholecystitis. Leukocytosis improved from 27.1-22.4. Surgical consult pending. Continue Zosyn. #2. Hyponatremia. Improved from 126 to 130. #3. Hypokalemia. Improved to 3.5. #4. Diabetes. Glucose 157. Continue sliding scale NovoLog. Principal Problem:   Acute cholecystitis Active Problems:   Mild dementia   Parkinsonism ([place])   Hyponatremia   RUQ abdominal pain   History of right bundle branch block   Diabetes ([place])   Stroke ([place]) history   Pressure injury of skin     LOS: 1 day   Bj Morlock 09/18/2016, 7:47 AM

## 2016-09-18 NOTE — Care Management Note (Addendum)
Case Management Note  Patient Details  Name: Stephen Bates MRN: [id number] Date of Birth: 08/19/1926  Subjective/Objective: Adm from home with acute cholecystitis. Surgical consult, hx of cholecystitis/cholelithiasis. Lives with wife, is bed bound. Has a Private aide, 2-3 times a week, payed for OOP.  Reports having a hospital bed and hoyer lift at home. Also has walker and WC. Wife states they use [facility] transportation to get to MD appointments. No oxygen PTA, currently on 1L.  Wife reports having used [facility] for [facility] services in the past, doesn't feel it is needed at this time.         Action/Plan: CM following for needs. Anticipate DC home in care of wife.   Expected Discharge Date:       09/20/2016           Expected Discharge Plan:  Home/Self Care  In-House Referral:     Discharge planning Services  CM Consult  Post Acute Care Choice:    Choice offered to:     DME Arranged:    DME Agency:     HH Arranged:    HH Agency:     Status of Service:  In process, will continue to follow  If discussed at Long Length of Stay Meetings, dates discussed:    Additional Comments:  Destenie Ingber, Chauncey Reading, RN 09/18/2016, 3:04 PM

## 2016-09-19 LAB — BASIC METABOLIC PANEL
ANION GAP: 12 (ref 5–15)
BUN: 13 mg/dL (ref 6–20)
CHLORIDE: 100 mmol/L — AB (ref 101–111)
CO2: 21 mmol/L — ABNORMAL LOW (ref 22–32)
Calcium: 8.6 mg/dL — ABNORMAL LOW (ref 8.9–10.3)
Creatinine, Ser: 0.56 mg/dL — ABNORMAL LOW (ref 0.61–1.24)
GFR calc Af Amer: >60 mL/min (ref >60)
GLUCOSE: 76 mg/dL (ref 65–99)
POTASSIUM: 3.6 mmol/L (ref 3.5–5.1)
SODIUM: 133 mmol/L — AB (ref 135–145)

## 2016-09-19 LAB — HEPATIC FUNCTION PANEL
ALBUMIN: 2.5 g/dL — AB (ref 3.5–5.0)
ALT: 13 U/L — ABNORMAL LOW (ref 17–63)
AST: 15 U/L (ref 15–41)
Alkaline Phosphatase: 72 U/L (ref 38–126)
BILIRUBIN DIRECT: 0.3 mg/dL (ref 0.1–0.5)
BILIRUBIN TOTAL: 1.3 mg/dL — AB (ref 0.3–1.2)
Indirect Bilirubin: 1 mg/dL — ABNORMAL HIGH (ref 0.3–0.9)
Total Protein: 6.1 g/dL — ABNORMAL LOW (ref 6.5–8.1)

## 2016-09-19 LAB — GLUCOSE, CAPILLARY
GLUCOSE-CAPILLARY: 294 mg/dL — AB (ref 65–99)
GLUCOSE-CAPILLARY: 78 mg/dL (ref 65–99)
GLUCOSE-CAPILLARY: 87 mg/dL (ref 65–99)
Glucose-Capillary: 77 mg/dL (ref 65–99)
Glucose-Capillary: 209 mg/dL — ABNORMAL HIGH (ref 65–99)
Glucose-Capillary: 320 mg/dL — ABNORMAL HIGH (ref 65–99)

## 2016-09-19 LAB — CBC
HEMATOCRIT: 36.1 % — AB (ref 39.0–52.0)
HEMOGLOBIN: 12.1 g/dL — AB (ref 13.0–17.0)
MCH: 35 pg — ABNORMAL HIGH (ref 26.0–34.0)
MCHC: 33.5 g/dL (ref 30.0–36.0)
MCV: 104.3 fL — AB (ref 78.0–100.0)
Platelets: 270 10*3/uL (ref 150–400)
RBC: 3.46 MIL/uL — ABNORMAL LOW (ref 4.22–5.81)
RDW: 13.4 % (ref 11.5–15.5)
WBC: 23.2 10*3/uL — AB (ref 4.0–10.5)

## 2016-09-19 LAB — PHOSPHORUS: PHOSPHORUS: 2.8 mg/dL (ref 2.5–4.6)

## 2016-09-19 LAB — MAGNESIUM: Magnesium: 1.9 mg/dL (ref 1.7–2.4)

## 2016-09-19 LAB — MRSA PCR SCREENING: MRSA BY PCR: POSITIVE — AB

## 2016-09-19 MED ORDER — CHLORHEXIDINE GLUCONATE CLOTH 2 % EX PADS
6.0000 | MEDICATED_PAD | Freq: Every day | CUTANEOUS | Status: DC
  Administered 2016-09-19 – 2016-09-24 (×5): 6 via TOPICAL

## 2016-09-19 MED ORDER — MUPIROCIN 2 % EX OINT
TOPICAL_OINTMENT | Freq: Two times a day (BID) | CUTANEOUS | Status: DC
  Administered 2016-09-19 – 2016-09-24 (×10): via NASAL
  Filled 2016-09-19 (×3): qty 22

## 2016-09-19 NOTE — Progress Notes (Signed)
Subjective: Patient denies any nausea, vomiting, or abdominal pain.  Objective: Vital signs in last 24 hours: Temp:  [97.4 F (36.3 C)-98.5 F (36.9 C)] 97.8 F (36.6 C) (03/16 0529) Pulse Rate:  [68-88] 78 (03/16 0529) Resp:  [17-24] 18 (03/16 0529) BP: (97-127)/(44-80) 111/50 (03/16 0529) SpO2:  [91 %-100 %] 100 % (03/16 0529) Weight:  [138 lb 14.2 oz (63 kg)] 138 lb 14.2 oz (63 kg) (03/16 0529) Last BM Date: 09/18/16  Intake/Output from previous day: 03/15 0701 - 03/16 0700 In: 1350 [I.V.:1200; IV Piggyback:150] Out: 1500 [Urine:1500] Intake/Output this shift: No intake/output data recorded.  General appearance: alert, cooperative and no distress GI: Soft, no rigidity noted. Slight decrease in distention noted. I could not elicit any right upper quadrant abdominal pain to palpation.  Lab Results:   Recent Labs  09/18/16 0509 09/19/16 0426  WBC 22.4* 23.2*  HGB 13.1 12.1*  HCT 39.5 36.1*  PLT 254 270   BMET  Recent Labs  09/18/16 0509 09/19/16 0426  NA 130* 133*  K 3.5 3.6  CL 96* 100*  CO2 27 21*  GLUCOSE 157* 76  BUN 13 13  CREATININE 0.66 0.56*  CALCIUM 8.7* 8.6*   PT/INR No results for input(s): LABPROT, INR in the last 72 hours.  Studies/Results: Ct Abdomen Pelvis W Contrast  Result Date: 09/17/2016 CLINICAL DATA:  Right-sided abdominal pain with history of prostate cancer EXAM: CT ABDOMEN AND PELVIS WITH CONTRAST TECHNIQUE: Multidetector CT imaging of the abdomen and pelvis was performed using the standard protocol following bolus administration of intravenous contrast. CONTRAST:  53mL ISOVUE-300 IOPAMIDOL (ISOVUE-300) INJECTION 61%, 131mL ISOVUE-300 IOPAMIDOL (ISOVUE-300) INJECTION 61% COMPARISON:  08/24/2015, 05/22/2015 FINDINGS: Lower chest: Small right-sided pleural effusion. Atelectasis at the left lung base. Partial consolidation in the right lower lobe, atelectasis versus infiltrate. Coronary artery calcifications. No large pericardial  effusion. Hepatobiliary: Mild intra hepatic biliary dilatation. There are multiple calcified stones within the gallbladder lumen. There appears to be gallbladder wall thickening. There is edema and soft tissue stranding in the right upper quadrant. Extrahepatic bile duct does not appear dilated. Pancreas: No peripancreatic inflammation. A cystic density at the pancreatic head measures 16 x 13 mm and appears grossly unchanged. Spleen: Normal in size without focal abnormality. Adrenals/Urinary Tract: Subcentimeter hypodense lesions in the kidneys, too small to further characterize. No hydronephrosis. The bladder is enlarged. Stomach/Bowel: The stomach is nondilated. No dilated small bowel. Appendix normal. Focal wall thickening and inflammation of the hepatic flexure. Moderate stool throughout the colon. Mild wall thickening of the rectum Vascular/Lymphatic: Non aneurysmal aorta. Atherosclerotic calcifications. No grossly enlarged lymph nodes. Reproductive: Multiple seed implants in the prostate. Other: No free air or free fluid. Musculoskeletal: Vertebral augmentation at T11, T12, and L1 as before. Heterogenous sclerosis and lucency within the bilateral femoral heads does not appear significantly changed. IMPRESSION: 1. Multiple calcified gallstones. There is inflammatory process in the right upper quadrant of the abdomen as evidenced by soft tissue stranding, edema, and focal wall thickening of the hepatic flexure. Suspect that the changes are secondary to cholecystitis with reactive wall thickening of adjacent hepatic flexure. 2. Stable 16 mm cystic lesion at the head of the pancreas 3. Mild rectal wall thickening suggestive of proctitis 4. Small right-sided pleural effusion with atelectasis or infiltrate at the right lower lobe. Electronically Signed   By: Donavan Foil M.D.   On: 09/17/2016 21:44    Anti-infectives: Anti-infectives    Start     Dose/Rate Route Frequency Ordered Stop  09/18/16 0700   piperacillin-tazobactam (ZOSYN) IVPB 3.375 g     3.375 g 12.5 mL/hr over 240 Minutes Intravenous Every 8 hours 09/18/16 0114     09/17/16 2215  piperacillin-tazobactam (ZOSYN) IVPB 3.375 g     3.375 g 100 mL/hr over 30 Minutes Intravenous  Once 09/17/16 2207 09/17/16 2309      Assessment/Plan: Impression: Acute cholecystitis, cholelithiasis. Leukocytosis still elevated, though liver enzyme tests are unremarkable. Patient adamantly refuses any surgical intervention. We will continue to monitor leukocytosis. May need percutaneous drainage should leukocytosis persist. We'll reevaluate in a.m. May advance diet as tolerated.  LOS: 2 days    Aviva Signs 09/19/2016

## 2016-09-19 NOTE — Care Management Important Message (Signed)
Important Message  Patient Details  Name: Stephen Bates MRN: [id number] Date of Birth: 21-Sep-1926   Medicare Important Message Given:  Yes    Sherald Barge, RN 09/19/2016, 1:48 PM

## 2016-09-19 NOTE — Progress Notes (Signed)
Subjective: He denies abdominal pain or vomiting. No fever.  Objective: Vital signs in last 24 hours: Vitals:   09/18/16 1600 09/18/16 1642 09/18/16 2005 09/19/16 0529  BP: 110/73  (!) 127/44 (!) 111/50  Pulse: 82  88 78  Resp: 20  18 18   Temp:  97.4 F (36.3 C) 98.2 F (36.8 C) 97.8 F (36.6 C)  TempSrc:  Axillary Oral Oral  SpO2: 93%  100% 100%  Weight:    138 lb 14.2 oz (63 kg)  Height:       Weight change: 13 lb 14.2 oz (6.3 kg)  Intake/Output Summary (Last 24 hours) at 09/19/16 0757 Last data filed at 09/19/16 0529  Gross per 24 hour  Intake             1350 ml  Output             1500 ml  Net             -150 ml    Physical Exam: Alert. No distress. No scleral icterus. Lungs clear. Heart regular with no murmurs. Abdomen tender in the right upper quadrant. Minimal bowel sounds.  Lab Results:    Results for orders placed or performed during the hospital encounter of 09/17/16 (from the past 24 hour(s))  Glucose, capillary     Status: Abnormal   Collection Time: 09/18/16 11:33 AM  Result Value Ref Range   Glucose-Capillary 125 (H) 65 - 99 mg/dL  Glucose, capillary     Status: None   Collection Time: 09/18/16  4:40 PM  Result Value Ref Range   Glucose-Capillary 96 65 - 99 mg/dL  Glucose, capillary     Status: None   Collection Time: 09/18/16  9:29 PM  Result Value Ref Range   Glucose-Capillary 87 65 - 99 mg/dL  Glucose, capillary     Status: None   Collection Time: 09/19/16 12:43 AM  Result Value Ref Range   Glucose-Capillary 78 65 - 99 mg/dL  Glucose, capillary     Status: None   Collection Time: 09/19/16  3:33 AM  Result Value Ref Range   Glucose-Capillary 77 65 - 99 mg/dL   Comment 1 Notify RN    Comment 2 Document in Chart   Basic metabolic panel     Status: Abnormal   Collection Time: 09/19/16  4:26 AM  Result Value Ref Range   Sodium 133 (L) 135 - 145 mmol/L   Potassium 3.6 3.5 - 5.1 mmol/L   Chloride 100 (L) 101 - 111 mmol/L   CO2 21 (L) 22 - 32  mmol/L   Glucose, Bld 76 65 - 99 mg/dL   BUN 13 6 - 20 mg/dL   Creatinine, Ser 0.56 (L) 0.61 - 1.24 mg/dL   Calcium 8.6 (L) 8.9 - 10.3 mg/dL   GFR calc non Af Amer >60 >60 mL/min   GFR calc Af Amer >60 >60 mL/min   Anion gap 12 5 - 15  CBC     Status: Abnormal   Collection Time: 09/19/16  4:26 AM  Result Value Ref Range   WBC 23.2 (H) 4.0 - 10.5 K/uL   RBC 3.46 (L) 4.22 - 5.81 MIL/uL   Hemoglobin 12.1 (L) 13.0 - 17.0 g/dL   HCT 36.1 (L) 39.0 - 52.0 %   MCV 104.3 (H) 78.0 - 100.0 fL   MCH 35.0 (H) 26.0 - 34.0 pg   MCHC 33.5 30.0 - 36.0 g/dL   RDW 13.4 11.5 - 15.5 %  Platelets 270 150 - 400 K/uL  Hepatic function panel     Status: Abnormal   Collection Time: 09/19/16  4:26 AM  Result Value Ref Range   Total Protein 6.1 (L) 6.5 - 8.1 g/dL   Albumin 2.5 (L) 3.5 - 5.0 g/dL   AST 15 15 - 41 U/L   ALT 13 (L) 17 - 63 U/L   Alkaline Phosphatase 72 38 - 126 U/L   Total Bilirubin 1.3 (H) 0.3 - 1.2 mg/dL   Bilirubin, Direct 0.3 0.1 - 0.5 mg/dL   Indirect Bilirubin 1.0 (H) 0.3 - 0.9 mg/dL  Magnesium     Status: None   Collection Time: 09/19/16  4:26 AM  Result Value Ref Range   Magnesium 1.9 1.7 - 2.4 mg/dL  Phosphorus     Status: None   Collection Time: 09/19/16  4:26 AM  Result Value Ref Range   Phosphorus 2.8 2.5 - 4.6 mg/dL  Glucose, capillary     Status: None   Collection Time: 09/19/16  7:42 AM  Result Value Ref Range   Glucose-Capillary 87 65 - 99 mg/dL     ABGS No results for input(s): PHART, PO2ART, TCO2, HCO3 in the last 72 hours.  Invalid input(s): PCO2 CULTURES Recent Results (from the past 240 hour(s))  MRSA PCR Screening     Status: Abnormal   Collection Time: 09/18/16  4:39 AM  Result Value Ref Range Status   MRSA by PCR POSITIVE (A) NEGATIVE Final    Comment: RESULT CALLED TO, READ BACK BY AND VERIFIED WITH: TETREAULT,H AT 0320 ON 09/19/2016 BY ISLLEY,B        The GeneXpert MRSA Assay (FDA approved for NASAL specimens only), is one component of  a comprehensive MRSA colonization surveillance program. It is not intended to diagnose MRSA infection nor to guide or monitor treatment for MRSA infections.    Studies/Results: Ct Abdomen Pelvis W Contrast  Result Date: 09/17/2016 CLINICAL DATA:  Right-sided abdominal pain with history of prostate cancer EXAM: CT ABDOMEN AND PELVIS WITH CONTRAST TECHNIQUE: Multidetector CT imaging of the abdomen and pelvis was performed using the standard protocol following bolus administration of intravenous contrast. CONTRAST:  36mL ISOVUE-300 IOPAMIDOL (ISOVUE-300) INJECTION 61%, 113mL ISOVUE-300 IOPAMIDOL (ISOVUE-300) INJECTION 61% COMPARISON:  08/24/2015, 05/22/2015 FINDINGS: Lower chest: Small right-sided pleural effusion. Atelectasis at the left lung base. Partial consolidation in the right lower lobe, atelectasis versus infiltrate. Coronary artery calcifications. No large pericardial effusion. Hepatobiliary: Mild intra hepatic biliary dilatation. There are multiple calcified stones within the gallbladder lumen. There appears to be gallbladder wall thickening. There is edema and soft tissue stranding in the right upper quadrant. Extrahepatic bile duct does not appear dilated. Pancreas: No peripancreatic inflammation. A cystic density at the pancreatic head measures 16 x 13 mm and appears grossly unchanged. Spleen: Normal in size without focal abnormality. Adrenals/Urinary Tract: Subcentimeter hypodense lesions in the kidneys, too small to further characterize. No hydronephrosis. The bladder is enlarged. Stomach/Bowel: The stomach is nondilated. No dilated small bowel. Appendix normal. Focal wall thickening and inflammation of the hepatic flexure. Moderate stool throughout the colon. Mild wall thickening of the rectum Vascular/Lymphatic: Non aneurysmal aorta. Atherosclerotic calcifications. No grossly enlarged lymph nodes. Reproductive: Multiple seed implants in the prostate. Other: No free air or free fluid.  Musculoskeletal: Vertebral augmentation at T11, T12, and L1 as before. Heterogenous sclerosis and lucency within the bilateral femoral heads does not appear significantly changed. IMPRESSION: 1. Multiple calcified gallstones. There is inflammatory process in the right upper  quadrant of the abdomen as evidenced by soft tissue stranding, edema, and focal wall thickening of the hepatic flexure. Suspect that the changes are secondary to cholecystitis with reactive wall thickening of adjacent hepatic flexure. 2. Stable 16 mm cystic lesion at the head of the pancreas 3. Mild rectal wall thickening suggestive of proctitis 4. Small right-sided pleural effusion with atelectasis or infiltrate at the right lower lobe. Electronically Signed   By: Donavan Foil M.D.   On: 09/17/2016 21:44   Micro Results: Recent Results (from the past 240 hour(s))  MRSA PCR Screening     Status: Abnormal   Collection Time: 09/18/16  4:39 AM  Result Value Ref Range Status   MRSA by PCR POSITIVE (A) NEGATIVE Final    Comment: RESULT CALLED TO, READ BACK BY AND VERIFIED WITH: TETREAULT,H AT 0320 ON 09/19/2016 BY ISLLEY,B        The GeneXpert MRSA Assay (FDA approved for NASAL specimens only), is one component of a comprehensive MRSA colonization surveillance program. It is not intended to diagnose MRSA infection nor to guide or monitor treatment for MRSA infections.    Studies/Results: Ct Abdomen Pelvis W Contrast  Result Date: 09/17/2016 CLINICAL DATA:  Right-sided abdominal pain with history of prostate cancer EXAM: CT ABDOMEN AND PELVIS WITH CONTRAST TECHNIQUE: Multidetector CT imaging of the abdomen and pelvis was performed using the standard protocol following bolus administration of intravenous contrast. CONTRAST:  85mL ISOVUE-300 IOPAMIDOL (ISOVUE-300) INJECTION 61%, 157mL ISOVUE-300 IOPAMIDOL (ISOVUE-300) INJECTION 61% COMPARISON:  08/24/2015, 05/22/2015 FINDINGS: Lower chest: Small right-sided pleural effusion.  Atelectasis at the left lung base. Partial consolidation in the right lower lobe, atelectasis versus infiltrate. Coronary artery calcifications. No large pericardial effusion. Hepatobiliary: Mild intra hepatic biliary dilatation. There are multiple calcified stones within the gallbladder lumen. There appears to be gallbladder wall thickening. There is edema and soft tissue stranding in the right upper quadrant. Extrahepatic bile duct does not appear dilated. Pancreas: No peripancreatic inflammation. A cystic density at the pancreatic head measures 16 x 13 mm and appears grossly unchanged. Spleen: Normal in size without focal abnormality. Adrenals/Urinary Tract: Subcentimeter hypodense lesions in the kidneys, too small to further characterize. No hydronephrosis. The bladder is enlarged. Stomach/Bowel: The stomach is nondilated. No dilated small bowel. Appendix normal. Focal wall thickening and inflammation of the hepatic flexure. Moderate stool throughout the colon. Mild wall thickening of the rectum Vascular/Lymphatic: Non aneurysmal aorta. Atherosclerotic calcifications. No grossly enlarged lymph nodes. Reproductive: Multiple seed implants in the prostate. Other: No free air or free fluid. Musculoskeletal: Vertebral augmentation at T11, T12, and L1 as before. Heterogenous sclerosis and lucency within the bilateral femoral heads does not appear significantly changed. IMPRESSION: 1. Multiple calcified gallstones. There is inflammatory process in the right upper quadrant of the abdomen as evidenced by soft tissue stranding, edema, and focal wall thickening of the hepatic flexure. Suspect that the changes are secondary to cholecystitis with reactive wall thickening of adjacent hepatic flexure. 2. Stable 16 mm cystic lesion at the head of the pancreas 3. Mild rectal wall thickening suggestive of proctitis 4. Small right-sided pleural effusion with atelectasis or infiltrate at the right lower lobe. Electronically Signed    By: Donavan Foil M.D.   On: 09/17/2016 21:44   Medications:  I have reviewed the patient's current medications Scheduled Meds: . chlorhexidine  15 mL Mouth Rinse BID  . Chlorhexidine Gluconate Cloth  6 each Topical Q0600  . feeding supplement  1 Container Oral BID BM  . feeding  supplement (PRO-STAT SUGAR FREE 64)  30 mL Oral BID BM  . insulin aspart  0-9 Units Subcutaneous Q4H  . mouth rinse  15 mL Mouth Rinse q12n4p  . mupirocin ointment   Nasal BID  . piperacillin-tazobactam (ZOSYN)  IV  3.375 g Intravenous Q8H   Continuous Infusions: PRN Meds:.morphine injection, ondansetron **OR** ondansetron (ZOFRAN) IV   Assessment/Plan: #1. Acute cholecystitis. Continue Zosyn. Discussed with Dr. Arnoldo Morale. Patient does not wish have a cholecystectomy. Possible IR candidate. Leukocytosis persists at 23.2. Transaminases are normal. Echo reveals normal left ventricular function with an ejection fraction of 60-65% with mild left ventricular hypertrophy and no wall motion abnormalities. #2. Diabetes. Switch to before meals and at bedtime Accu-Cheks. Clear liquids today. Principal Problem:   Acute cholecystitis Active Problems:   Mild dementia   Parkinsonism ([place])   Hyponatremia   RUQ abdominal pain   History of right bundle branch block   Diabetes ([place])   Stroke ([place]) history   Pressure injury of skin     LOS: 2 days   Myna Freimark 09/19/2016, 7:57 AM

## 2016-09-20 ENCOUNTER — Other Ambulatory Visit: Payer: Self-pay

## 2016-09-20 LAB — CBC
HCT: 37.3 % — ABNORMAL LOW (ref 39.0–52.0)
Hemoglobin: 12.5 g/dL — ABNORMAL LOW (ref 13.0–17.0)
MCH: 34.2 pg — ABNORMAL HIGH (ref 26.0–34.0)
MCHC: 33.5 g/dL (ref 30.0–36.0)
MCV: 101.9 fL — ABNORMAL HIGH (ref 78.0–100.0)
PLATELETS: 308 10*3/uL (ref 150–400)
RBC: 3.66 MIL/uL — AB (ref 4.22–5.81)
RDW: 13.1 % (ref 11.5–15.5)
WBC: 20.4 10*3/uL — AB (ref 4.0–10.5)

## 2016-09-20 LAB — GLUCOSE, CAPILLARY
GLUCOSE-CAPILLARY: 125 mg/dL — AB (ref 65–99)
GLUCOSE-CAPILLARY: 127 mg/dL — AB (ref 65–99)
GLUCOSE-CAPILLARY: 164 mg/dL — AB (ref 65–99)
GLUCOSE-CAPILLARY: 310 mg/dL — AB (ref 65–99)
Glucose-Capillary: 292 mg/dL — ABNORMAL HIGH (ref 65–99)
Glucose-Capillary: 207 mg/dL — ABNORMAL HIGH (ref 65–99)

## 2016-09-20 LAB — TSH: TSH: 0.526 u[IU]/mL (ref 0.350–4.500)

## 2016-09-20 MED ORDER — METOPROLOL TARTRATE 25 MG PO TABS
25.0000 mg | ORAL_TABLET | Freq: Two times a day (BID) | ORAL | Status: DC
  Administered 2016-09-20 – 2016-09-24 (×9): 25 mg via ORAL
  Filled 2016-09-20 (×9): qty 1

## 2016-09-20 MED ORDER — BISACODYL 10 MG RE SUPP
10.0000 mg | Freq: Two times a day (BID) | RECTAL | Status: DC
  Administered 2016-09-20 – 2016-09-23 (×3): 10 mg via RECTAL
  Filled 2016-09-20 (×3): qty 1

## 2016-09-20 MED ORDER — POLYETHYLENE GLYCOL 3350 17 G PO PACK
17.0000 g | PACK | Freq: Every day | ORAL | Status: DC
  Administered 2016-09-20 – 2016-09-23 (×3): 17 g via ORAL
  Filled 2016-09-20 (×4): qty 1

## 2016-09-20 NOTE — Progress Notes (Signed)
**Note De-Identified  Obfuscation** EKG completed; results reported to RN and placed in patient chart.

## 2016-09-20 NOTE — Progress Notes (Signed)
  Subjective: Patient states he still has discomfort in the upper abdomen. He has not had a bowel movement in the last 3 days.  Objective: Vital signs in last 24 hours: Temp:  [98.8 F (37.1 C)-100.2 F (37.9 C)] 99.8 F (37.7 C) (03/17 0645) Pulse Rate:  [43-82] 81 (03/17 0645) Resp:  [14-18] 14 (03/17 0645) BP: (115-134)/(40-55) 124/55 (03/17 0645) SpO2:  [93 %-99 %] 93 % (03/17 0645) Last BM Date: 09/18/16  Intake/Output from previous day: 03/16 0701 - 03/17 0700 In: 840 [P.O.:840] Out: -  Intake/Output this shift: No intake/output data recorded.  General appearance: alert, cooperative, appears stated age and no distress GI: Soft but distended. Occasional bowel sounds appreciated. No rigidity noted. Frail skin noted along left side of abdomen. Mild discomfort to deep palpation in the right upper quadrant.  Lab Results:   Recent Labs  09/19/16 0426 09/20/16 0651  WBC 23.2* 20.4*  HGB 12.1* 12.5*  HCT 36.1* 37.3*  PLT 270 308   BMET  Recent Labs  09/18/16 0509 09/19/16 0426  NA 130* 133*  K 3.5 3.6  CL 96* 100*  CO2 27 21*  GLUCOSE 157* 76  BUN 13 13  CREATININE 0.66 0.56*  CALCIUM 8.7* 8.6*   PT/INR No results for input(s): LABPROT, INR in the last 72 hours.  Studies/Results: No results found.  Anti-infectives: Anti-infectives    Start     Dose/Rate Route Frequency Ordered Stop   09/18/16 0700  piperacillin-tazobactam (ZOSYN) IVPB 3.375 g     3.375 g 12.5 mL/hr over 240 Minutes Intravenous Every 8 hours 09/18/16 0114     09/17/16 2215  piperacillin-tazobactam (ZOSYN) IVPB 3.375 g     3.375 g 100 mL/hr over 30 Minutes Intravenous  Once 09/17/16 2207 09/17/16 2309      Assessment/Plan: Impression: Leukocytosis secondary to cholecystitis slightly improved. Plan: We'll give cathartics. Patient is a high risk surgical candidate and if the leukocytosis persists, may request IR to drain the gallbladder.  LOS: 3 days    Aviva Signs 09/20/2016

## 2016-09-20 NOTE — Progress Notes (Signed)
Subjective: He notes increased abdominal pain this morning. He denies any vomiting. No fever noted. Tolerating clear liquids.  Objective: Vital signs in last 24 hours: Vitals:   09/19/16 0529 09/19/16 1408 09/19/16 2228 09/20/16 0645  BP: (!) 111/50 (!) 134/40 (!) 115/46 (!) 124/55  Pulse: 78 (!) 43 82 81  Resp: 18 18 17 14   Temp: 97.8 F (36.6 C) 98.8 F (37.1 C) 100.2 F (37.9 C) 99.8 F (37.7 C)  TempSrc: Oral Oral Oral Oral  SpO2: 100% 94% 99% 93%  Weight: 138 lb 14.2 oz (63 kg)     Height:       Weight change:   Intake/Output Summary (Last 24 hours) at 09/20/16 0913 Last data filed at 09/19/16 1757  Gross per 24 hour  Intake              600 ml  Output                0 ml  Net              600 ml    Physical Exam: No distress. Lungs clear. Heart irregularly irregular in the 100 range on telemetry. Appears to be in A. fib. Abdomen distended and tender in the right upper quadrant.  Lab Results:    Results for orders placed or performed during the hospital encounter of 09/17/16 (from the past 24 hour(s))  Glucose, capillary     Status: Abnormal   Collection Time: 09/19/16 11:23 AM  Result Value Ref Range   Glucose-Capillary 209 (H) 65 - 99 mg/dL  Glucose, capillary     Status: Abnormal   Collection Time: 09/19/16  4:10 PM  Result Value Ref Range   Glucose-Capillary 294 (H) 65 - 99 mg/dL  Glucose, capillary     Status: Abnormal   Collection Time: 09/19/16  8:00 PM  Result Value Ref Range   Glucose-Capillary 320 (H) 65 - 99 mg/dL   Comment 1 Notify RN    Comment 2 Document in Chart   Glucose, capillary     Status: Abnormal   Collection Time: 09/20/16  1:27 AM  Result Value Ref Range   Glucose-Capillary 125 (H) 65 - 99 mg/dL   Comment 1 Notify RN    Comment 2 Document in Chart   Glucose, capillary     Status: Abnormal   Collection Time: 09/20/16  4:06 AM  Result Value Ref Range   Glucose-Capillary 127 (H) 65 - 99 mg/dL   Comment 1 Notify RN    Comment 2  Document in Chart   CBC     Status: Abnormal   Collection Time: 09/20/16  6:51 AM  Result Value Ref Range   WBC 20.4 (H) 4.0 - 10.5 K/uL   RBC 3.66 (L) 4.22 - 5.81 MIL/uL   Hemoglobin 12.5 (L) 13.0 - 17.0 g/dL   HCT 37.3 (L) 39.0 - 52.0 %   MCV 101.9 (H) 78.0 - 100.0 fL   MCH 34.2 (H) 26.0 - 34.0 pg   MCHC 33.5 30.0 - 36.0 g/dL   RDW 13.1 11.5 - 15.5 %   Platelets 308 150 - 400 K/uL  Glucose, capillary     Status: Abnormal   Collection Time: 09/20/16  7:45 AM  Result Value Ref Range   Glucose-Capillary 164 (H) 65 - 99 mg/dL     ABGS No results for input(s): PHART, PO2ART, TCO2, HCO3 in the last 72 hours.  Invalid input(s): PCO2 CULTURES Recent Results (from the past 240  hour(s))  MRSA PCR Screening     Status: Abnormal   Collection Time: 09/18/16  4:39 AM  Result Value Ref Range Status   MRSA by PCR POSITIVE (A) NEGATIVE Final    Comment: RESULT CALLED TO, READ BACK BY AND VERIFIED WITH: TETREAULT,H AT 0320 ON 09/19/2016 BY ISLLEY,B        The GeneXpert MRSA Assay (FDA approved for NASAL specimens only), is one component of a comprehensive MRSA colonization surveillance program. It is not intended to diagnose MRSA infection nor to guide or monitor treatment for MRSA infections.    Studies/Results: No results found. Micro Results: Recent Results (from the past 240 hour(s))  MRSA PCR Screening     Status: Abnormal   Collection Time: 09/18/16  4:39 AM  Result Value Ref Range Status   MRSA by PCR POSITIVE (A) NEGATIVE Final    Comment: RESULT CALLED TO, READ BACK BY AND VERIFIED WITH: TETREAULT,H AT 0320 ON 09/19/2016 BY ISLLEY,B        The GeneXpert MRSA Assay (FDA approved for NASAL specimens only), is one component of a comprehensive MRSA colonization surveillance program. It is not intended to diagnose MRSA infection nor to guide or monitor treatment for MRSA infections.    Studies/Results: No results found. Medications:  I have reviewed the patient's  current medications Scheduled Meds: . chlorhexidine  15 mL Mouth Rinse BID  . Chlorhexidine Gluconate Cloth  6 each Topical Q0600  . feeding supplement  1 Container Oral BID BM  . feeding supplement (PRO-STAT SUGAR FREE 64)  30 mL Oral BID BM  . insulin aspart  0-9 Units Subcutaneous Q4H  . mouth rinse  15 mL Mouth Rinse q12n4p  . metoprolol tartrate  25 mg Oral BID  . mupirocin ointment   Nasal BID  . piperacillin-tazobactam (ZOSYN)  IV  3.375 g Intravenous Q8H   Continuous Infusions: PRN Meds:.morphine injection, ondansetron **OR** ondansetron (ZOFRAN) IV   Assessment/Plan: #1. Acute cholecystitis. He is reconsidering surgery at this point. His wife would like for him to proceed with surgery. WBC 20. Continue Zosyn. #2. New onset atrial fibrillation. Start metoprolol 25 mg twice daily with his rate in the low 100s now. Check TSH. Echo reveals no significant valvular abnormalities. LV function is normal. #3. Diabetes. Continue NovoLog. Principal Problem:   Acute cholecystitis Active Problems:   Mild dementia   Parkinsonism ([place])   Hyponatremia   RUQ abdominal pain   History of right bundle branch block   Diabetes ([place])   Stroke ([place]) history   Pressure injury of skin     LOS: 3 days   Omunique Pederson 09/20/2016, 9:13 AM

## 2016-09-21 LAB — BASIC METABOLIC PANEL
ANION GAP: 9 (ref 5–15)
BUN: 14 mg/dL (ref 6–20)
CO2: 28 mmol/L (ref 22–32)
Calcium: 8.7 mg/dL — ABNORMAL LOW (ref 8.9–10.3)
Chloride: 99 mmol/L — ABNORMAL LOW (ref 101–111)
Creatinine, Ser: 0.55 mg/dL — ABNORMAL LOW (ref 0.61–1.24)
GFR calc Af Amer: >60 mL/min (ref >60)
Glucose, Bld: 212 mg/dL — ABNORMAL HIGH (ref 65–99)
POTASSIUM: 2.4 mmol/L — AB (ref 3.5–5.1)
SODIUM: 136 mmol/L (ref 135–145)

## 2016-09-21 LAB — CBC
HEMATOCRIT: 37.6 % — AB (ref 39.0–52.0)
HEMOGLOBIN: 12.7 g/dL — AB (ref 13.0–17.0)
MCH: 34.4 pg — ABNORMAL HIGH (ref 26.0–34.0)
MCHC: 33.8 g/dL (ref 30.0–36.0)
MCV: 101.9 fL — ABNORMAL HIGH (ref 78.0–100.0)
Platelets: 361 10*3/uL (ref 150–400)
RBC: 3.69 MIL/uL — ABNORMAL LOW (ref 4.22–5.81)
RDW: 13.1 % (ref 11.5–15.5)
WBC: 18.1 10*3/uL — ABNORMAL HIGH (ref 4.0–10.5)

## 2016-09-21 LAB — GLUCOSE, CAPILLARY
GLUCOSE-CAPILLARY: 218 mg/dL — AB (ref 65–99)
Glucose-Capillary: 178 mg/dL — ABNORMAL HIGH (ref 65–99)
Glucose-Capillary: 184 mg/dL — ABNORMAL HIGH (ref 65–99)
Glucose-Capillary: 184 mg/dL — ABNORMAL HIGH (ref 65–99)
Glucose-Capillary: 279 mg/dL — ABNORMAL HIGH (ref 65–99)
Glucose-Capillary: 292 mg/dL — ABNORMAL HIGH (ref 65–99)

## 2016-09-21 MED ORDER — POTASSIUM CHLORIDE 10 MEQ/100ML IV SOLN
10.0000 meq | INTRAVENOUS | Status: AC
  Administered 2016-09-21 (×6): 10 meq via INTRAVENOUS
  Filled 2016-09-21 (×6): qty 100

## 2016-09-21 NOTE — Progress Notes (Signed)
Subjective: He complains of right-sided abdominal pain when he moves. He has had a fever to 100.2. His bowels have moved once.  Objective: Vital signs in last 24 hours: Vitals:   09/19/16 2228 09/20/16 0645 09/20/16 2300 09/21/16 0506  BP: (!) 115/46 (!) 124/55 (!) 126/56 (!) 122/52  Pulse: 82 81 74 75  Resp: 17 14 19 14   Temp: 100.2 F (37.9 C) 99.8 F (37.7 C) 98.9 F (37.2 C) 100.2 F (37.9 C)  TempSrc: Oral Oral Oral Oral  SpO2: 99% 93% 97% 92%  Weight:      Height:       Weight change:   Intake/Output Summary (Last 24 hours) at 09/21/16 0942 Last data filed at 09/20/16 1828  Gross per 24 hour  Intake             1010 ml  Output                0 ml  Net             1010 ml    Physical Exam: Alert. Lungs clear. Heart regular with no murmurs. Abdomen soft and tender in the right upper quadrant. Extremities reveal no edema.  Lab Results:    Results for orders placed or performed during the hospital encounter of 09/17/16 (from the past 24 hour(s))  Glucose, capillary     Status: Abnormal   Collection Time: 09/20/16 11:22 AM  Result Value Ref Range   Glucose-Capillary 292 (H) 65 - 99 mg/dL  Glucose, capillary     Status: Abnormal   Collection Time: 09/20/16  4:29 PM  Result Value Ref Range   Glucose-Capillary 310 (H) 65 - 99 mg/dL  Glucose, capillary     Status: Abnormal   Collection Time: 09/20/16  7:57 PM  Result Value Ref Range   Glucose-Capillary 207 (H) 65 - 99 mg/dL  Glucose, capillary     Status: Abnormal   Collection Time: 09/21/16 12:44 AM  Result Value Ref Range   Glucose-Capillary 178 (H) 65 - 99 mg/dL   Comment 1 Notify RN    Comment 2 Document in Chart   Glucose, capillary     Status: Abnormal   Collection Time: 09/21/16  5:01 AM  Result Value Ref Range   Glucose-Capillary 184 (H) 65 - 99 mg/dL   Comment 1 Notify RN    Comment 2 Document in Chart   Basic metabolic panel     Status: Abnormal   Collection Time: 09/21/16  7:06 AM  Result Value  Ref Range   Sodium 136 135 - 145 mmol/L   Potassium 2.4 (LL) 3.5 - 5.1 mmol/L   Chloride 99 (L) 101 - 111 mmol/L   CO2 28 22 - 32 mmol/L   Glucose, Bld 212 (H) 65 - 99 mg/dL   BUN 14 6 - 20 mg/dL   Creatinine, Ser 0.55 (L) 0.61 - 1.24 mg/dL   Calcium 8.7 (L) 8.9 - 10.3 mg/dL   GFR calc non Af Amer >60 >60 mL/min   GFR calc Af Amer >60 >60 mL/min   Anion gap 9 5 - 15  CBC     Status: Abnormal   Collection Time: 09/21/16  7:06 AM  Result Value Ref Range   WBC 18.1 (H) 4.0 - 10.5 K/uL   RBC 3.69 (L) 4.22 - 5.81 MIL/uL   Hemoglobin 12.7 (L) 13.0 - 17.0 g/dL   HCT 37.6 (L) 39.0 - 52.0 %   MCV 101.9 (H) 78.0 - 100.0  fL   MCH 34.4 (H) 26.0 - 34.0 pg   MCHC 33.8 30.0 - 36.0 g/dL   RDW 13.1 11.5 - 15.5 %   Platelets 361 150 - 400 K/uL  Glucose, capillary     Status: Abnormal   Collection Time: 09/21/16  7:49 AM  Result Value Ref Range   Glucose-Capillary 184 (H) 65 - 99 mg/dL     ABGS No results for input(s): PHART, PO2ART, TCO2, HCO3 in the last 72 hours.  Invalid input(s): PCO2 CULTURES Recent Results (from the past 240 hour(s))  MRSA PCR Screening     Status: Abnormal   Collection Time: 09/18/16  4:39 AM  Result Value Ref Range Status   MRSA by PCR POSITIVE (A) NEGATIVE Final    Comment: RESULT CALLED TO, READ BACK BY AND VERIFIED WITH: TETREAULT,H AT 0320 ON 09/19/2016 BY ISLLEY,B        The GeneXpert MRSA Assay (FDA approved for NASAL specimens only), is one component of a comprehensive MRSA colonization surveillance program. It is not intended to diagnose MRSA infection nor to guide or monitor treatment for MRSA infections.    Studies/Results: No results found. Micro Results: Recent Results (from the past 240 hour(s))  MRSA PCR Screening     Status: Abnormal   Collection Time: 09/18/16  4:39 AM  Result Value Ref Range Status   MRSA by PCR POSITIVE (A) NEGATIVE Final    Comment: RESULT CALLED TO, READ BACK BY AND VERIFIED WITH: TETREAULT,H AT 0320 ON  09/19/2016 BY ISLLEY,B        The GeneXpert MRSA Assay (FDA approved for NASAL specimens only), is one component of a comprehensive MRSA colonization surveillance program. It is not intended to diagnose MRSA infection nor to guide or monitor treatment for MRSA infections.    Studies/Results: No results found. Medications:  I have reviewed the patient's current medications Scheduled Meds: . bisacodyl  10 mg Rectal BID AC  . chlorhexidine  15 mL Mouth Rinse BID  . Chlorhexidine Gluconate Cloth  6 each Topical Q0600  . feeding supplement  1 Container Oral BID BM  . feeding supplement (PRO-STAT SUGAR FREE 64)  30 mL Oral BID BM  . insulin aspart  0-9 Units Subcutaneous Q4H  . mouth rinse  15 mL Mouth Rinse q12n4p  . metoprolol tartrate  25 mg Oral BID  . mupirocin ointment   Nasal BID  . piperacillin-tazobactam (ZOSYN)  IV  3.375 g Intravenous Q8H  . polyethylene glycol  17 g Oral Daily  . potassium chloride  10 mEq Intravenous Q1 Hr x 6   Continuous Infusions: PRN Meds:.morphine injection, ondansetron **OR** ondansetron (ZOFRAN) IV   Assessment/Plan: #1. Acute cholecystitis. White count improved 18.1. Continue Zosyn. He is now in favor of proceeding with cholecystectomy. Will discuss further with surgery. #2. Atrial fibrillation. He converted to sinus rhythm and appears in sinus rhythm on telemetry now. Heart rate now in the 70s. Continue metoprolol. He is euthyroid with a TSH of 0.5. #3. Diabetes. Glucose this morning was 184. Continue sliding scale NovoLog. #4. Hypokalemia. Potassium dropped from 3.6-2.4. Intravenous replacement was started earlier this morning. Principal Problem:   Acute cholecystitis Active Problems:   Mild dementia   Parkinsonism ([place])   Hyponatremia   RUQ abdominal pain   History of right bundle branch block   Diabetes ([place])   Stroke ([place]) history   Pressure injury of skin     LOS: 4 days   Myria Steenbergen 09/21/2016, 9:42 AM

## 2016-09-21 NOTE — Progress Notes (Signed)
**Note De-Identified  Obfuscation** EKG complete abnormal results reported to RN and placed in patient chart.

## 2016-09-21 NOTE — Progress Notes (Signed)
  Subjective: Patient with fever yesterday evening. Still with right upper quadrant abdominal discomfort.  Objective: Vital signs in last 24 hours: Temp:  [98.9 F (37.2 C)-100.2 F (37.9 C)] 100.2 F (37.9 C) (03/18 0506) Pulse Rate:  [74-75] 75 (03/18 0506) Resp:  [14-19] 14 (03/18 0506) BP: (122-126)/(52-56) 122/52 (03/18 0506) SpO2:  [92 %-97 %] 92 % (03/18 0506) Last BM Date: 09/20/16  Intake/Output from previous day: 03/17 0701 - 03/18 0700 In: 1010 [P.O.:960; IV Piggyback:50] Out: -  Intake/Output this shift: Total I/O In: 240 [P.O.:240] Out: -   General appearance: alert, cooperative, appears stated age and no distress GI: Soft, occasional bowel sounds appreciated. Minimal discomfort in the right upper quadrant to deep palpation. No rigidity noted.  Lab Results:   Recent Labs  09/20/16 0651 09/21/16 0706  WBC 20.4* 18.1*  HGB 12.5* 12.7*  HCT 37.3* 37.6*  PLT 308 361   BMET  Recent Labs  09/19/16 0426 09/21/16 0706  NA 133* 136  K 3.6 2.4*  CL 100* 99*  CO2 21* 28  GLUCOSE 76 212*  BUN 13 14  CREATININE 0.56* 0.55*  CALCIUM 8.6* 8.7*   PT/INR No results for input(s): LABPROT, INR in the last 72 hours.  Studies/Results: No results found.  Anti-infectives: Anti-infectives    Start     Dose/Rate Route Frequency Ordered Stop   09/18/16 0700  piperacillin-tazobactam (ZOSYN) IVPB 3.375 g     3.375 g 12.5 mL/hr over 240 Minutes Intravenous Every 8 hours 09/18/16 0114     09/17/16 2215  piperacillin-tazobactam (ZOSYN) IVPB 3.375 g     3.375 g 100 mL/hr over 30 Minutes Intravenous  Once 09/17/16 2207 09/17/16 2309      Assessment/Plan: Impression: Cholecystitis with cholelithiasis, leukocytosis slowly resolving, but he did have a fever last night. Patient is a high risk surgical candidate, thus will set him up for interventional radiologic drainage of the gallbladder. Patient and wife understand and agreed to the procedure.  LOS: 4 days     Aviva Signs 09/21/2016

## 2016-09-22 ENCOUNTER — Encounter (HOSPITAL_COMMUNITY): Payer: Self-pay

## 2016-09-22 ENCOUNTER — Ambulatory Visit (HOSPITAL_COMMUNITY)
Admit: 2016-09-22 | Discharge: 2016-09-22 | Disposition: A | Discharge status: Home / Self Care | Payer: Medicare Other | Attending: General Surgery | Admitting: General Surgery

## 2016-09-22 VITALS — BP 143/68 | HR 76 | Resp 17

## 2016-09-22 DIAGNOSIS — K810 Acute cholecystitis: Secondary | ICD-10-CM

## 2016-09-22 HISTORY — PX: IR GENERIC HISTORICAL: IMG1180011

## 2016-09-22 LAB — GLUCOSE, CAPILLARY
GLUCOSE-CAPILLARY: 113 mg/dL — AB (ref 65–99)
GLUCOSE-CAPILLARY: 184 mg/dL — AB (ref 65–99)
Glucose-Capillary: 95 mg/dL (ref 65–99)
Glucose-Capillary: 134 mg/dL — ABNORMAL HIGH (ref 65–99)
Glucose-Capillary: 142 mg/dL — ABNORMAL HIGH (ref 65–99)

## 2016-09-22 LAB — CBC
HEMATOCRIT: 36 % — AB (ref 39.0–52.0)
HEMOGLOBIN: 11.8 g/dL — AB (ref 13.0–17.0)
MCH: 33.7 pg (ref 26.0–34.0)
MCHC: 32.8 g/dL (ref 30.0–36.0)
MCV: 102.9 fL — ABNORMAL HIGH (ref 78.0–100.0)
Platelets: 381 10*3/uL (ref 150–400)
RBC: 3.5 MIL/uL — AB (ref 4.22–5.81)
RDW: 13.3 % (ref 11.5–15.5)
WBC: 16.7 10*3/uL — AB (ref 4.0–10.5)

## 2016-09-22 LAB — BASIC METABOLIC PANEL
ANION GAP: 7 (ref 5–15)
BUN: 19 mg/dL (ref 6–20)
CALCIUM: 8.6 mg/dL — AB (ref 8.9–10.3)
CO2: 29 mmol/L (ref 22–32)
Chloride: 103 mmol/L (ref 101–111)
Creatinine, Ser: 0.55 mg/dL — ABNORMAL LOW (ref 0.61–1.24)
GLUCOSE: 105 mg/dL — AB (ref 65–99)
POTASSIUM: 3 mmol/L — AB (ref 3.5–5.1)
Sodium: 139 mmol/L (ref 135–145)

## 2016-09-22 LAB — HEPATIC FUNCTION PANEL
ALT: 36 U/L (ref 17–63)
AST: 33 U/L (ref 15–41)
Albumin: 2.2 g/dL — ABNORMAL LOW (ref 3.5–5.0)
Alkaline Phosphatase: 89 U/L (ref 38–126)
Bilirubin, Direct: 0.3 mg/dL (ref 0.1–0.5)
Indirect Bilirubin: 0.4 mg/dL (ref 0.3–0.9)
TOTAL PROTEIN: 5.9 g/dL — AB (ref 6.5–8.1)
Total Bilirubin: 0.7 mg/dL (ref 0.3–1.2)

## 2016-09-22 LAB — PROTIME-INR
INR: 1.18
PROTHROMBIN TIME: 15.1 s (ref 11.4–15.2)

## 2016-09-22 MED ORDER — FENTANYL CITRATE (PF) 100 MCG/2ML IJ SOLN
INTRAMUSCULAR | Status: AC | PRN
  Administered 2016-09-22 (×4): 25 ug via INTRAVENOUS

## 2016-09-22 MED ORDER — FENTANYL CITRATE (PF) 100 MCG/2ML IJ SOLN
INTRAMUSCULAR | Status: AC
  Filled 2016-09-22: qty 2

## 2016-09-22 MED ORDER — SODIUM CHLORIDE 0.9 % IV SOLN: 30.0000 meq | Freq: Once | INTRAVENOUS | Status: DC

## 2016-09-22 MED ORDER — LIDOCAINE HCL (PF) 1 % IJ SOLN
INTRAMUSCULAR | Status: AC
  Filled 2016-09-22: qty 30

## 2016-09-22 MED ORDER — IOPAMIDOL (ISOVUE-300) INJECTION 61%
INTRAVENOUS | Status: DC
Start: 2016-09-22 — End: 2016-09-23
  Filled 2016-09-22: qty 50

## 2016-09-22 MED ORDER — MIDAZOLAM HCL 2 MG/2ML IJ SOLN
INTRAMUSCULAR | Status: AC
  Filled 2016-09-22: qty 2

## 2016-09-22 MED ORDER — MIDAZOLAM HCL 2 MG/2ML IJ SOLN
INTRAMUSCULAR | Status: AC | PRN
  Administered 2016-09-22 (×2): 0.5 mg via INTRAVENOUS
  Administered 2016-09-22: 1 mg via INTRAVENOUS

## 2016-09-22 MED ORDER — POTASSIUM CHLORIDE 10 MEQ/100ML IV SOLN
10.0000 meq | INTRAVENOUS | Status: AC
  Administered 2016-09-22 (×3): 10 meq via INTRAVENOUS
  Filled 2016-09-22: qty 100

## 2016-09-22 MED ORDER — POTASSIUM CHLORIDE 10 MEQ/100ML IV SOLN
INTRAVENOUS | Status: AC
  Administered 2016-09-22: 10 meq via INTRAVENOUS
  Filled 2016-09-22: qty 100

## 2016-09-22 MED ORDER — LIDOCAINE HCL 1 % IJ SOLN
INTRAMUSCULAR | Status: AC | PRN
Start: 2016-09-22 — End: 2016-09-22
  Administered 2016-09-22: 10 mL

## 2016-09-22 NOTE — Progress Notes (Signed)
Patient returned from IR at [facility].  VS stable.  Dr. Willey Blade called and notified.  Dr. Willey Blade gave order for carb modified diet.

## 2016-09-22 NOTE — Sedation Documentation (Signed)
MD and PA spoke with family, questions answered, wife gave consent. Pt answering mostly appropriately.

## 2016-09-22 NOTE — Sedation Documentation (Signed)
Patient is resting comfortably. 

## 2016-09-22 NOTE — Progress Notes (Signed)
Subjective: He notes no change in his pain. Maximum temperature over 24 hours has been 99.3. Arrangements are in progress for interventional radiology drainage of his gallbladder.  Objective: Vital signs in last 24 hours: Vitals:   09/20/16 2300 09/21/16 0506 09/21/16 1510 09/22/16 0634  BP: (!) 126/56 (!) 122/52 (!) 120/50 124/65  Pulse: 74 75 (!) 44 (!) 56  Resp: 19 14 16 14   Temp: 98.9 F (37.2 C) 100.2 F (37.9 C) 99.3 F (37.4 C) 98.7 F (37.1 C)  TempSrc: Oral Oral Oral Oral  SpO2: 97% 92% 93% 95%  Weight:      Height:       Weight change:   Intake/Output Summary (Last 24 hours) at 09/22/16 0745 Last data filed at 09/22/16 0400  Gross per 24 hour  Intake             1640 ml  Output             1250 ml  Net              390 ml    Physical Exam: Alert. No distress. Lungs clear. Heart regular with no murmurs. Abdomen tender in the right upper quadrant.  Lab Results:    Results for orders placed or performed during the hospital encounter of 09/17/16 (from the past 24 hour(s))  Glucose, capillary     Status: Abnormal   Collection Time: 09/21/16  7:49 AM  Result Value Ref Range   Glucose-Capillary 184 (H) 65 - 99 mg/dL  Glucose, capillary     Status: Abnormal   Collection Time: 09/21/16 11:24 AM  Result Value Ref Range   Glucose-Capillary 279 (H) 65 - 99 mg/dL  Glucose, capillary     Status: Abnormal   Collection Time: 09/21/16  4:31 PM  Result Value Ref Range   Glucose-Capillary 292 (H) 65 - 99 mg/dL  Glucose, capillary     Status: Abnormal   Collection Time: 09/21/16  8:06 PM  Result Value Ref Range   Glucose-Capillary 218 (H) 65 - 99 mg/dL  Glucose, capillary     Status: None   Collection Time: 09/22/16  1:09 AM  Result Value Ref Range   Glucose-Capillary 95 65 - 99 mg/dL   Comment 1 Notify RN    Comment 2 Document in Chart   Hepatic function panel     Status: Abnormal   Collection Time: 09/22/16  4:37 AM  Result Value Ref Range   Total Protein 5.9 (L)  6.5 - 8.1 g/dL   Albumin 2.2 (L) 3.5 - 5.0 g/dL   AST 33 15 - 41 U/L   ALT 36 17 - 63 U/L   Alkaline Phosphatase 89 38 - 126 U/L   Total Bilirubin 0.7 0.3 - 1.2 mg/dL   Bilirubin, Direct 0.3 0.1 - 0.5 mg/dL   Indirect Bilirubin 0.4 0.3 - 0.9 mg/dL  Basic metabolic panel     Status: Abnormal   Collection Time: 09/22/16  4:37 AM  Result Value Ref Range   Sodium 139 135 - 145 mmol/L   Potassium 3.0 (L) 3.5 - 5.1 mmol/L   Chloride 103 101 - 111 mmol/L   CO2 29 22 - 32 mmol/L   Glucose, Bld 105 (H) 65 - 99 mg/dL   BUN 19 6 - 20 mg/dL   Creatinine, Ser 0.55 (L) 0.61 - 1.24 mg/dL   Calcium 8.6 (L) 8.9 - 10.3 mg/dL   GFR calc non Af Amer >60 >60 mL/min   GFR calc  Af Amer >60 >60 mL/min   Anion gap 7 5 - 15  CBC     Status: Abnormal   Collection Time: 09/22/16  4:37 AM  Result Value Ref Range   WBC 16.7 (H) 4.0 - 10.5 K/uL   RBC 3.50 (L) 4.22 - 5.81 MIL/uL   Hemoglobin 11.8 (L) 13.0 - 17.0 g/dL   HCT 36.0 (L) 39.0 - 52.0 %   MCV 102.9 (H) 78.0 - 100.0 fL   MCH 33.7 26.0 - 34.0 pg   MCHC 32.8 30.0 - 36.0 g/dL   RDW 13.3 11.5 - 15.5 %   Platelets 381 150 - 400 K/uL  Glucose, capillary     Status: Abnormal   Collection Time: 09/22/16  4:49 AM  Result Value Ref Range   Glucose-Capillary 113 (H) 65 - 99 mg/dL     ABGS No results for input(s): PHART, PO2ART, TCO2, HCO3 in the last 72 hours.  Invalid input(s): PCO2 CULTURES Recent Results (from the past 240 hour(s))  MRSA PCR Screening     Status: Abnormal   Collection Time: 09/18/16  4:39 AM  Result Value Ref Range Status   MRSA by PCR POSITIVE (A) NEGATIVE Final    Comment: RESULT CALLED TO, READ BACK BY AND VERIFIED WITH: TETREAULT,H AT 0320 ON 09/19/2016 BY ISLLEY,B        The GeneXpert MRSA Assay (FDA approved for NASAL specimens only), is one component of a comprehensive MRSA colonization surveillance program. It is not intended to diagnose MRSA infection nor to guide or monitor treatment for MRSA infections.     Studies/Results: No results found. Micro Results: Recent Results (from the past 240 hour(s))  MRSA PCR Screening     Status: Abnormal   Collection Time: 09/18/16  4:39 AM  Result Value Ref Range Status   MRSA by PCR POSITIVE (A) NEGATIVE Final    Comment: RESULT CALLED TO, READ BACK BY AND VERIFIED WITH: TETREAULT,H AT 0320 ON 09/19/2016 BY ISLLEY,B        The GeneXpert MRSA Assay (FDA approved for NASAL specimens only), is one component of a comprehensive MRSA colonization surveillance program. It is not intended to diagnose MRSA infection nor to guide or monitor treatment for MRSA infections.    Studies/Results: No results found. Medications:  I have reviewed the patient's current medications Scheduled Meds: . bisacodyl  10 mg Rectal BID AC  . chlorhexidine  15 mL Mouth Rinse BID  . Chlorhexidine Gluconate Cloth  6 each Topical Q0600  . feeding supplement  1 Container Oral BID BM  . feeding supplement (PRO-STAT SUGAR FREE 64)  30 mL Oral BID BM  . insulin aspart  0-9 Units Subcutaneous Q4H  . mouth rinse  15 mL Mouth Rinse q12n4p  . metoprolol tartrate  25 mg Oral BID  . mupirocin ointment   Nasal BID  . piperacillin-tazobactam (ZOSYN)  IV  3.375 g Intravenous Q8H  . polyethylene glycol  17 g Oral Daily  . potassium chloride (KCL MULTIRUN) 30 mEq in 265 mL IVPB  30 mEq Intravenous Once   Continuous Infusions: PRN Meds:.morphine injection, ondansetron **OR** ondansetron (ZOFRAN) IV   Assessment/Plan:  #1. Acute cholecystitis. White count improved at 16.7. Continue Zosyn. LFTs unremarkable.  Drainage pending. #2. Hypokalemia. Improved to 3.0. Continue supplementation. #3. Diabetes. Glucose 105 this morning. #4. Paroxysmal atrial fibrillation. EKG reveals normal sinus rhythm now with a right bundle branch block. Principal Problem:   Acute cholecystitis Active Problems:   Mild dementia   Parkinsonism (  [place])   Hyponatremia   RUQ abdominal pain   History of right  bundle branch block   Diabetes ([place])   Stroke [place]) history   Pressure injury of skin     LOS: 5 days   Stephen Bates 09/22/2016, 7:45 AM

## 2016-09-22 NOTE — Sedation Documentation (Signed)
Pt transferred back to [facility] via [place], VS stable, family not here at this time. Report called to [place]

## 2016-09-22 NOTE — Procedures (Signed)
Interventional Radiology Procedure Note  Procedure: Percutaneous cholecystostomy  Complications: None  Estimated Blood Loss: < 10 mL  10 Fr perc choly tube placed in GB lumen.  High GB positioning. Purulent bile in GB.  Sample sent for culture.  Tube to gravity bag drainage.  Venetia Night. Kathlene Cote, M.D Pager:  [phone]

## 2016-09-22 NOTE — Progress Notes (Signed)
ANTIBIOTIC CONSULT NOTE  Pharmacy Consult for zosyn Indication: intra-abdominal infection  No Known Allergies  Patient Measurements: Height: 5\' 4"  (162.6 cm) Weight: 138 lb 14.2 oz (63 kg) IBW/kg (Calculated) : 59.2 Adjusted Body Weight:   Vital Signs: Temp: 98.7 F (37.1 C) (03/19 0634) Temp Source: Oral (03/19 0634) BP: 124/65 (03/19 0634) Pulse Rate: 56 (03/19 0634)  Labs:  Recent Labs  09/20/16 0651 09/21/16 0706 09/22/16 0437  WBC 20.4* 18.1* 16.7*  HGB 12.5* 12.7* 11.8*  PLT 308 361 381  CREATININE  --  0.55* 0.55*    Estimated Creatinine Clearance: 54.5 mL/min (A) (by C-G formula based on SCr of 0.55 mg/dL (L)).  No results for input(s): VANCOTROUGH, VANCOPEAK, VANCORANDOM, GENTTROUGH, GENTPEAK, GENTRANDOM, TOBRATROUGH, TOBRAPEAK, TOBRARND, AMIKACINPEAK, AMIKACINTROU, AMIKACIN in the last 72 hours.   Microbiology: Recent Results (from the past 720 hour(s))  MRSA PCR Screening     Status: Abnormal   Collection Time: 09/18/16  4:39 AM  Result Value Ref Range Status   MRSA by PCR POSITIVE (A) NEGATIVE Final    Comment: RESULT CALLED TO, READ BACK BY AND VERIFIED WITH: TETREAULT,H AT 0320 ON 09/19/2016 BY ISLLEY,B        The GeneXpert MRSA Assay (FDA approved for NASAL specimens only), is one component of a comprehensive MRSA colonization surveillance program. It is not intended to diagnose MRSA infection nor to guide or monitor treatment for MRSA infections.    Medical History: Past Medical History:  Diagnosis Date  . Arthritis   . Benign neoplasm of colon   . Chronic back pain   . Diabetes ([place])   . Frequent falls 2012  . History of right bundle branch block   . Hypertension   . Lumbar radiculopathy   . Mild dementia   . Neuropathy ([place])   . Prostate cancer ([place])   . Stroke ([place]) 1982  . Weakness of both legs   . Wears glasses    Medications:  Scheduled:  . bisacodyl  10 mg Rectal BID AC  . chlorhexidine  15 mL Mouth Rinse BID  .  Chlorhexidine Gluconate Cloth  6 each Topical Q0600  . feeding supplement  1 Container Oral BID BM  . feeding supplement (PRO-STAT SUGAR FREE 64)  30 mL Oral BID BM  . insulin aspart  0-9 Units Subcutaneous Q4H  . mouth rinse  15 mL Mouth Rinse q12n4p  . metoprolol tartrate  25 mg Oral BID  . mupirocin ointment   Nasal BID  . piperacillin-tazobactam (ZOSYN)  IV  3.375 g Intravenous Q8H  . polyethylene glycol  17 g Oral Daily  . potassium chloride  10 mEq Intravenous Q1 Hr x 3   Infusions:   PRN: morphine injection, ondansetron **OR** ondansetron (ZOFRAN) IV Anti-infectives    Start     Dose/Rate Route Frequency Ordered Stop   09/18/16 0700  piperacillin-tazobactam (ZOSYN) IVPB 3.375 g     3.375 g 12.5 mL/hr over 240 Minutes Intravenous Every 8 hours 09/18/16 0114     09/17/16 2215  piperacillin-tazobactam (ZOSYN) IVPB 3.375 g     3.375 g 100 mL/hr over 30 Minutes Intravenous  Once 09/17/16 2207 09/17/16 2309     Assessment: 83 yo male with PMH CVA, RBBB, prostate cancer and cholecystitis a month ago.  Admitted for acute cholecystitis starting zosyn.   Plan:  Continue Zosyn 3.375gm IV q8h, EID Monitor labs, progress, c/s  Hart Robinsons A, RPH 09/22/2016,9:52 AM

## 2016-09-22 NOTE — Progress Notes (Signed)
Patient transported by [facility] to [facility] IR.

## 2016-09-22 NOTE — Consult Note (Signed)
Chief Complaint: acute cholecystitis  Referring Physician:Dr. Aviva Signs  Supervising Physician: Aletta Edouard  Patient Status: [facility] - inpt  HPI: Stephen Bates is an 83 y.o. male with multiple medical problems who was admitted 5 days ago with RUQ abdominal pain.  He had a CT scan that revealed acute cholecystitis with a stone in the neck of his gallbladder.  He has been treated with antibiotic therapy, but spiked a temperature last night of 100.2 and still having abdominal pain.  Because he isn't a great surgical candidate at this time, a request for a percutaneous cholecystostomy drain has been placed.  He was also noted on this admission to have A. Fib, but his most recent EKG reveals sinus rhythm with a RBBB.  The patient presents to [facility] IR for his procedure.    Past Medical History:  Past Medical History:  Diagnosis Date  . Arthritis   . Benign neoplasm of colon   . Chronic back pain   . Diabetes ([place])   . Frequent falls 2012  . History of right bundle branch block   . Hypertension   . Lumbar radiculopathy   . Mild dementia   . Neuropathy ([place])   . Prostate cancer ([place])   . Stroke ([place]) 1982  . Weakness of both legs   . Wears glasses     Past Surgical History:  Past Surgical History:  Procedure Laterality Date  . COLONOSCOPY    . ERCP N/A 05/24/2015   Procedure: ENDOSCOPIC RETROGRADE CHOLANGIOPANCREATOGRAPHY;  Surgeon: Rogene Houston, MD;  Location: [facility]S;  Service: Endoscopy;  Laterality: N/A;  . EYE SURGERY  2010   both cataracts  . INSERTION PROSTATE RADIATION SEED  2001  . kyphoplasty    . MOHS SURGERY  12/12/2008  . SPHINCTEROTOMY N/A 05/24/2015   Procedure: BILIARY SPHINCTEROTOMY;  Surgeon: Rogene Houston, MD;  Location: [facility]S;  Service: Endoscopy;  Laterality: N/A;    Family History:  Family History  Problem Relation Age of Onset  . Bladder Cancer Father   . Bronchopulmonary dysplasia Mother   . Colon cancer Sister     Social History:   reports that he quit smoking about 47 years ago. He has never used smokeless tobacco. He reports that he drinks about 1.8 oz of alcohol per week . He reports that he does not use drugs.  Allergies: No Known Allergies  Medications: Medications reviewed in epic  Please HPI for pertinent positives, otherwise complete 10 system ROS negative.  Mallampati Score: MD Evaluation Airway: WNL Heart: Other (comments) Heart  comments: ectopy noted Abdomen: WNL Chest/ Lungs: WNL ASA  Classification: 3 Mallampati/Airway Score: Two  Physical Exam: BP (!) 136/42   Pulse 82   SpO2 96%   General: pleasant, elderly white male who is laying in bed in NAD HEENT: head is normocephalic, multiple small wounds presents all over his head.  Sclera are noninjected.  PERRL.  Ears and nose without any masses or lesions.  Mouth is pink  Heart: regular with a lot of ectopy.  Normal s1,s2. No obvious murmurs, gallops, or rubs noted.  Palpable radial pulses bilaterally Lungs: CTAB, no wheezes, rhonchi, or rales noted.  Respiratory effort nonlabored, on 3L [place]  Abd: soft, tender in RUQ, ND, +BS, no masses, hernias, or organomegaly Psych: A&O to person, place, and situation, but slightly forgetful to date/time with an appropriate affect.   Labs: Results for orders placed or performed during the hospital encounter of 09/17/16 (from the  past 48 hour(s))  Glucose, capillary     Status: Abnormal   Collection Time: 09/20/16  4:29 PM  Result Value Ref Range   Glucose-Capillary 310 (H) 65 - 99 mg/dL  Glucose, capillary     Status: Abnormal   Collection Time: 09/20/16  7:57 PM  Result Value Ref Range   Glucose-Capillary 207 (H) 65 - 99 mg/dL  Glucose, capillary     Status: Abnormal   Collection Time: 09/21/16 12:44 AM  Result Value Ref Range   Glucose-Capillary 178 (H) 65 - 99 mg/dL   Comment 1 Notify RN    Comment 2 Document in Chart   Glucose, capillary     Status: Abnormal   Collection Time: 09/21/16  5:01 AM    Result Value Ref Range   Glucose-Capillary 184 (H) 65 - 99 mg/dL   Comment 1 Notify RN    Comment 2 Document in Chart   Basic metabolic panel     Status: Abnormal   Collection Time: 09/21/16  7:06 AM  Result Value Ref Range   Sodium 136 135 - 145 mmol/L   Potassium 2.4 (LL) 3.5 - 5.1 mmol/L    Comment: CRITICAL RESULT CALLED TO, READ BACK BY AND VERIFIED WITH: STONE,B AT 0827 ON 09/21/2016 BY MOSLEY,J    Chloride 99 (L) 101 - 111 mmol/L   CO2 28 22 - 32 mmol/L   Glucose, Bld 212 (H) 65 - 99 mg/dL   BUN 14 6 - 20 mg/dL   Creatinine, Ser 0.55 (L) 0.61 - 1.24 mg/dL   Calcium 8.7 (L) 8.9 - 10.3 mg/dL   GFR calc non Af Amer >60 >60 mL/min   GFR calc Af Amer >60 >60 mL/min    Comment: (NOTE) The eGFR has been calculated using the CKD EPI equation. This calculation has not been validated in all clinical situations. eGFR's persistently <60 mL/min signify possible Chronic Kidney Disease.    Anion gap 9 5 - 15  CBC     Status: Abnormal   Collection Time: 09/21/16  7:06 AM  Result Value Ref Range   WBC 18.1 (H) 4.0 - 10.5 K/uL   RBC 3.69 (L) 4.22 - 5.81 MIL/uL   Hemoglobin 12.7 (L) 13.0 - 17.0 g/dL   HCT 37.6 (L) 39.0 - 52.0 %   MCV 101.9 (H) 78.0 - 100.0 fL   MCH 34.4 (H) 26.0 - 34.0 pg   MCHC 33.8 30.0 - 36.0 g/dL   RDW 13.1 11.5 - 15.5 %   Platelets 361 150 - 400 K/uL  Glucose, capillary     Status: Abnormal   Collection Time: 09/21/16  7:49 AM  Result Value Ref Range   Glucose-Capillary 184 (H) 65 - 99 mg/dL  Glucose, capillary     Status: Abnormal   Collection Time: 09/21/16 11:24 AM  Result Value Ref Range   Glucose-Capillary 279 (H) 65 - 99 mg/dL  Glucose, capillary     Status: Abnormal   Collection Time: 09/21/16  4:31 PM  Result Value Ref Range   Glucose-Capillary 292 (H) 65 - 99 mg/dL  Glucose, capillary     Status: Abnormal   Collection Time: 09/21/16  8:06 PM  Result Value Ref Range   Glucose-Capillary 218 (H) 65 - 99 mg/dL  Glucose, capillary     Status: None    Collection Time: 09/22/16  1:09 AM  Result Value Ref Range   Glucose-Capillary 95 65 - 99 mg/dL   Comment 1 Notify RN    Comment 2  Document in Chart   Hepatic function panel     Status: Abnormal   Collection Time: 09/22/16  4:37 AM  Result Value Ref Range   Total Protein 5.9 (L) 6.5 - 8.1 g/dL   Albumin 2.2 (L) 3.5 - 5.0 g/dL   AST 33 15 - 41 U/L   ALT 36 17 - 63 U/L   Alkaline Phosphatase 89 38 - 126 U/L   Total Bilirubin 0.7 0.3 - 1.2 mg/dL   Bilirubin, Direct 0.3 0.1 - 0.5 mg/dL   Indirect Bilirubin 0.4 0.3 - 0.9 mg/dL  Basic metabolic panel     Status: Abnormal   Collection Time: 09/22/16  4:37 AM  Result Value Ref Range   Sodium 139 135 - 145 mmol/L   Potassium 3.0 (L) 3.5 - 5.1 mmol/L    Comment: DELTA CHECK NOTED   Chloride 103 101 - 111 mmol/L   CO2 29 22 - 32 mmol/L   Glucose, Bld 105 (H) 65 - 99 mg/dL   BUN 19 6 - 20 mg/dL   Creatinine, Ser 0.55 (L) 0.61 - 1.24 mg/dL   Calcium 8.6 (L) 8.9 - 10.3 mg/dL   GFR calc non Af Amer >60 >60 mL/min   GFR calc Af Amer >60 >60 mL/min    Comment: (NOTE) The eGFR has been calculated using the CKD EPI equation. This calculation has not been validated in all clinical situations. eGFR's persistently <60 mL/min signify possible Chronic Kidney Disease.    Anion gap 7 5 - 15  CBC     Status: Abnormal   Collection Time: 09/22/16  4:37 AM  Result Value Ref Range   WBC 16.7 (H) 4.0 - 10.5 K/uL   RBC 3.50 (L) 4.22 - 5.81 MIL/uL   Hemoglobin 11.8 (L) 13.0 - 17.0 g/dL   HCT 36.0 (L) 39.0 - 52.0 %   MCV 102.9 (H) 78.0 - 100.0 fL   MCH 33.7 26.0 - 34.0 pg   MCHC 32.8 30.0 - 36.0 g/dL   RDW 13.3 11.5 - 15.5 %   Platelets 381 150 - 400 K/uL  Glucose, capillary     Status: Abnormal   Collection Time: 09/22/16  4:49 AM  Result Value Ref Range   Glucose-Capillary 113 (H) 65 - 99 mg/dL  Protime-INR     Status: None   Collection Time: 09/22/16  7:53 AM  Result Value Ref Range   Prothrombin Time 15.1 11.4 - 15.2 seconds   INR 1.18    Glucose, capillary     Status: Abnormal   Collection Time: 09/22/16  8:00 AM  Result Value Ref Range   Glucose-Capillary 134 (H) 65 - 99 mg/dL    Imaging: No results found.  Assessment/Plan 1. Acute cholecystitis -we will plan to proceed with placement of a percutaneous cholecystostomy drain today.  He will need to follow up in clinic in 5 weeks for a perc drain injection.  It will then need to be determined whether he is a surgical candidate or not to then decide on whether his drain needs to remain in place or if it can be removed in surgery. -labs and vitals reviewed -Risks and Benefits discussed with the patient including, but not limited to bleeding, infection, gallbladder perforation, bile leak, sepsis or even death. All of the patient's questions were answered, patient is agreeable to proceed. Consent signed and in chart.  Thank you for this interesting consult.  I greatly enjoyed meeting DIANDRE MERICA and look forward to participating in their care.  A copy of this report was sent to the requesting provider on this date.  Electronically Signed: Henreitta Cea 09/22/2016, 1:48 PM   I spent a total of 40 Minutes    in face to face in clinical consultation, greater than 50% of which was counseling/coordinating care for acute cholecystitis

## 2016-09-22 NOTE — Progress Notes (Signed)
Patient ID: Stephen Bates, male   DOB: December 29, 1926, 83 y.o.   MRN: [id number]   Request for percutaneous cholecystostomy drain placement rec'd in IR   Approved with Dr Kathlene Cote  Pt to arrive via ambulance to [facility] IR by 12noon today With family To return to [facility] after procedure  RN aware and agreeable

## 2016-09-23 LAB — GLUCOSE, CAPILLARY
GLUCOSE-CAPILLARY: 274 mg/dL — AB (ref 65–99)
GLUCOSE-CAPILLARY: 139 mg/dL — AB (ref 65–99)
Glucose-Capillary: 160 mg/dL — ABNORMAL HIGH (ref 65–99)
Glucose-Capillary: 168 mg/dL — ABNORMAL HIGH (ref 65–99)

## 2016-09-23 MED ORDER — ACETAMINOPHEN 325 MG PO TABS: 650.0000 mg | ORAL_TABLET | ORAL | Status: DC | PRN

## 2016-09-23 MED ORDER — TRAMADOL HCL 50 MG PO TABS
50.0000 mg | ORAL_TABLET | Freq: Four times a day (QID) | ORAL | Status: DC | PRN
  Administered 2016-09-23 (×2): 50 mg via ORAL
  Filled 2016-09-23 (×2): qty 1

## 2016-09-23 MED ORDER — POTASSIUM CHLORIDE CRYS ER 20 MEQ PO TBCR
20.0000 meq | EXTENDED_RELEASE_TABLET | Freq: Four times a day (QID) | ORAL | Status: AC
  Administered 2016-09-23 (×2): 20 meq via ORAL
  Filled 2016-09-23 (×2): qty 1

## 2016-09-23 MED ORDER — POTASSIUM CHLORIDE 20 MEQ/15ML (10%) PO SOLN: 20.0000 meq | Freq: Four times a day (QID) | ORAL | Status: AC

## 2016-09-23 NOTE — Progress Notes (Signed)
Subjective: Patient states he has had some upper abdominal pain overnight. Does not look uncomfortable.  Objective: Vital signs in last 24 hours: Temp:  [98.4 F (36.9 C)-99.7 F (37.6 C)] 99.7 F (37.6 C) (03/20 0414) Pulse Rate:  [69-84] 84 (03/20 0414) Resp:  [14-21] 15 (03/20 0414) BP: (108-153)/(42-90) 138/62 (03/20 0414) SpO2:  [91 %-100 %] 91 % (03/20 0414) Weight:  [146 lb 13.2 oz (66.6 kg)] 146 lb 13.2 oz (66.6 kg) (03/20 0414) Last BM Date: 09/22/16  Intake/Output from previous day: 03/19 0701 - 03/20 0700 In: 480 [P.O.:480] Out: -  Intake/Output this shift: No intake/output data recorded.  General appearance: alert, cooperative and no distress GI: Soft, less distended. Bilious fluid noted in catheter bag. No rigidity noted.  Lab Results:   Recent Labs  09/21/16 0706 09/22/16 0437  WBC 18.1* 16.7*  HGB 12.7* 11.8*  HCT 37.6* 36.0*  PLT 361 381   BMET  Recent Labs  09/21/16 0706 09/22/16 0437  NA 136 139  K 2.4* 3.0*  CL 99* 103  CO2 28 29  GLUCOSE 212* 105*  BUN 14 19  CREATININE 0.55* 0.55*  CALCIUM 8.7* 8.6*   PT/INR  Recent Labs  09/22/16 0753  LABPROT 15.1  INR 1.18    Studies/Results: Ir Perc Cholecystostomy  Result Date: 09/22/2016 INDICATION: Acute cholecystitis and need for percutaneous cholecystostomy tube has the patient is not a candidate for operative cholecystectomy at this time. EXAM: CHOLECYSTOSTOMY CONTRAST:  10 mL Isovue-300 MEDICATIONS: No additional medications. ANESTHESIA/SEDATION: Moderate (conscious) sedation was employed during this procedure. A total of Versed 2.0 mg and Fentanyl 100 mcg was administered intravenously. Moderate Sedation Time: 30 minutes. The patient's level of consciousness and vital signs were monitored continuously by radiology nursing throughout the procedure under my direct supervision. FLUOROSCOPY TIME:  Fluoroscopy Time: 1 minutes.  3.6 mGy. COMPLICATIONS: None immediate. PROCEDURE: Informed  written consent was obtained from the patient after a thorough discussion of the procedural risks, benefits and alternatives. All questions were addressed. Maximal Sterile Barrier Technique was utilized including caps, mask, sterile gowns, sterile gloves, sterile drape, hand hygiene and skin antiseptic. A timeout was performed prior to the initiation of the procedure. Ultrasound was used to localize the gallbladder. Under direct ultrasound guidance, a 21 gauge needle was advanced into the gallbladder lumen. After aspiration of bile, contrast injection was performed under fluoroscopy. A guidewire was then advanced into the gallbladder lumen and a transitional dilator place. The percutaneous tract was dilated over a guidewire and a 10 French drainage catheter advanced into the gallbladder lumen. A bile sample was aspirated and sent for culture analysis. Catheter positioning was confirmed by fluoroscopy after injection with contrast material. The catheter was secured at the skin with a Prolene retention suture and StatLock device. The catheter was flushed and attached to a gravity drainage bag. FINDINGS: After access of the gallbladder, aspiration yielded purulent appearing bile. A sample was sent for culture. After placement of the cholecystostomy tube, there is return of thick, purulent appearing fluid. IMPRESSION: Percutaneous cholecystostomy tube placement for drainage of the gallbladder. There was return of purulent bile from the gallbladder lumen. A sample of this fluid was sent for culture analysis. The cholecystostomy tube was attached to gravity bag drainage. Electronically Signed   By: Aletta Edouard M.D.   On: 09/22/2016 16:33    Anti-infectives: Anti-infectives    Start     Dose/Rate Route Frequency Ordered Stop   09/18/16 0700  piperacillin-tazobactam (ZOSYN) IVPB 3.375 g  3.375 g 12.5 mL/hr over 240 Minutes Intravenous Every 8 hours 09/18/16 0114     09/17/16 2215  piperacillin-tazobactam  (ZOSYN) IVPB 3.375 g     3.375 g 100 mL/hr over 30 Minutes Intravenous  Once 09/17/16 2207 09/17/16 2309      Assessment/Plan: Impression: Status post cholecystostomy tube placement. Patient appears better. White blood cell count pending. Agree with advancing diet.  LOS: 6 days    Aviva Signs 09/23/2016

## 2016-09-23 NOTE — Progress Notes (Signed)
Subjective: Complains of pain overnight. He does not want to use any more morphine as he states it did not help. Maximum temperature is been 99.7.  Objective: Vital signs in last 24 hours: Vitals:   09/22/16 1630 09/22/16 1903 09/22/16 2100 09/23/16 0414  BP: (!) 126/56 (!) 142/55 135/63 138/62  Pulse: 75 78 78 84  Resp:   18 15  Temp: 98.5 F (36.9 C) 98.4 F (36.9 C) 99.3 F (37.4 C) 99.7 F (37.6 C)  TempSrc: Oral Oral Oral Oral  SpO2: 100% 98% 93% 91%  Weight:    146 lb 13.2 oz (66.6 kg)  Height:       Weight change:   Intake/Output Summary (Last 24 hours) at 09/23/16 0741 Last data filed at 09/22/16 1700  Gross per 24 hour  Intake              480 ml  Output                0 ml  Net              480 ml    Physical Exam: Alert and comfortable appearing. Lungs clear. Heart regular with no murmurs. Abdomen less tender in the right upper quadrant.  Lab Results:    Results for orders placed or performed during the hospital encounter of 09/17/16 (from the past 24 hour(s))  Protime-INR     Status: None   Collection Time: 09/22/16  7:53 AM  Result Value Ref Range   Prothrombin Time 15.1 11.4 - 15.2 seconds   INR 1.18   Glucose, capillary     Status: Abnormal   Collection Time: 09/22/16  8:00 AM  Result Value Ref Range   Glucose-Capillary 134 (H) 65 - 99 mg/dL  Glucose, capillary     Status: Abnormal   Collection Time: 09/22/16  4:22 PM  Result Value Ref Range   Glucose-Capillary 142 (H) 65 - 99 mg/dL   Comment 1 Notify RN    Comment 2 Document in Chart   Glucose, capillary     Status: Abnormal   Collection Time: 09/22/16  9:13 PM  Result Value Ref Range   Glucose-Capillary 184 (H) 65 - 99 mg/dL     ABGS No results for input(s): PHART, PO2ART, TCO2, HCO3 in the last 72 hours.  Invalid input(s): PCO2 CULTURES Recent Results (from the past 240 hour(s))  MRSA PCR Screening     Status: Abnormal   Collection Time: 09/18/16  4:39 AM  Result Value Ref Range  Status   MRSA by PCR POSITIVE (A) NEGATIVE Final    Comment: RESULT CALLED TO, READ BACK BY AND VERIFIED WITH: TETREAULT,H AT 0320 ON 09/19/2016 BY ISLLEY,B        The GeneXpert MRSA Assay (FDA approved for NASAL specimens only), is one component of a comprehensive MRSA colonization surveillance program. It is not intended to diagnose MRSA infection nor to guide or monitor treatment for MRSA infections.   Aerobic/Anaerobic Culture (surgical/deep wound)     Status: None (Preliminary result)   Collection Time: 09/22/16  2:59 PM  Result Value Ref Range Status   Specimen Description BILE  Final   Special Requests Normal  Final   Gram Stain   Final    ABUNDANT WBC PRESENT, PREDOMINANTLY PMN ABUNDANT GRAM POSITIVE RODS MODERATE GRAM POSITIVE COCCI IN CLUSTERS MODERATE GRAM POSITIVE COCCI IN PAIRS    Culture PENDING  Incomplete   Report Status PENDING  Incomplete   Studies/Results: Ir Perc  Cholecystostomy  Result Date: 09/22/2016 INDICATION: Acute cholecystitis and need for percutaneous cholecystostomy tube has the patient is not a candidate for operative cholecystectomy at this time. EXAM: CHOLECYSTOSTOMY CONTRAST:  10 mL Isovue-300 MEDICATIONS: No additional medications. ANESTHESIA/SEDATION: Moderate (conscious) sedation was employed during this procedure. A total of Versed 2.0 mg and Fentanyl 100 mcg was administered intravenously. Moderate Sedation Time: 30 minutes. The patient's level of consciousness and vital signs were monitored continuously by radiology nursing throughout the procedure under my direct supervision. FLUOROSCOPY TIME:  Fluoroscopy Time: 1 minutes.  3.6 mGy. COMPLICATIONS: None immediate. PROCEDURE: Informed written consent was obtained from the patient after a thorough discussion of the procedural risks, benefits and alternatives. All questions were addressed. Maximal Sterile Barrier Technique was utilized including caps, mask, sterile gowns, sterile gloves, sterile  drape, hand hygiene and skin antiseptic. A timeout was performed prior to the initiation of the procedure. Ultrasound was used to localize the gallbladder. Under direct ultrasound guidance, a 21 gauge needle was advanced into the gallbladder lumen. After aspiration of bile, contrast injection was performed under fluoroscopy. A guidewire was then advanced into the gallbladder lumen and a transitional dilator place. The percutaneous tract was dilated over a guidewire and a 10 French drainage catheter advanced into the gallbladder lumen. A bile sample was aspirated and sent for culture analysis. Catheter positioning was confirmed by fluoroscopy after injection with contrast material. The catheter was secured at the skin with a Prolene retention suture and StatLock device. The catheter was flushed and attached to a gravity drainage bag. FINDINGS: After access of the gallbladder, aspiration yielded purulent appearing bile. A sample was sent for culture. After placement of the cholecystostomy tube, there is return of thick, purulent appearing fluid. IMPRESSION: Percutaneous cholecystostomy tube placement for drainage of the gallbladder. There was return of purulent bile from the gallbladder lumen. A sample of this fluid was sent for culture analysis. The cholecystostomy tube was attached to gravity bag drainage. Electronically Signed   By: Aletta Edouard M.D.   On: 09/22/2016 16:33   Micro Results: Recent Results (from the past 240 hour(s))  MRSA PCR Screening     Status: Abnormal   Collection Time: 09/18/16  4:39 AM  Result Value Ref Range Status   MRSA by PCR POSITIVE (A) NEGATIVE Final    Comment: RESULT CALLED TO, READ BACK BY AND VERIFIED WITH: TETREAULT,H AT 0320 ON 09/19/2016 BY ISLLEY,B        The GeneXpert MRSA Assay (FDA approved for NASAL specimens only), is one component of a comprehensive MRSA colonization surveillance program. It is not intended to diagnose MRSA infection nor to guide  or monitor treatment for MRSA infections.   Aerobic/Anaerobic Culture (surgical/deep wound)     Status: None (Preliminary result)   Collection Time: 09/22/16  2:59 PM  Result Value Ref Range Status   Specimen Description BILE  Final   Special Requests Normal  Final   Gram Stain   Final    ABUNDANT WBC PRESENT, PREDOMINANTLY PMN ABUNDANT GRAM POSITIVE RODS MODERATE GRAM POSITIVE COCCI IN CLUSTERS MODERATE GRAM POSITIVE COCCI IN PAIRS    Culture PENDING  Incomplete   Report Status PENDING  Incomplete   Studies/Results: Ir Perc Cholecystostomy  Result Date: 09/22/2016 INDICATION: Acute cholecystitis and need for percutaneous cholecystostomy tube has the patient is not a candidate for operative cholecystectomy at this time. EXAM: CHOLECYSTOSTOMY CONTRAST:  10 mL Isovue-300 MEDICATIONS: No additional medications. ANESTHESIA/SEDATION: Moderate (conscious) sedation was employed during this procedure. A total  of Versed 2.0 mg and Fentanyl 100 mcg was administered intravenously. Moderate Sedation Time: 30 minutes. The patient's level of consciousness and vital signs were monitored continuously by radiology nursing throughout the procedure under my direct supervision. FLUOROSCOPY TIME:  Fluoroscopy Time: 1 minutes.  3.6 mGy. COMPLICATIONS: None immediate. PROCEDURE: Informed written consent was obtained from the patient after a thorough discussion of the procedural risks, benefits and alternatives. All questions were addressed. Maximal Sterile Barrier Technique was utilized including caps, mask, sterile gowns, sterile gloves, sterile drape, hand hygiene and skin antiseptic. A timeout was performed prior to the initiation of the procedure. Ultrasound was used to localize the gallbladder. Under direct ultrasound guidance, a 21 gauge needle was advanced into the gallbladder lumen. After aspiration of bile, contrast injection was performed under fluoroscopy. A guidewire was then advanced into the gallbladder  lumen and a transitional dilator place. The percutaneous tract was dilated over a guidewire and a 10 French drainage catheter advanced into the gallbladder lumen. A bile sample was aspirated and sent for culture analysis. Catheter positioning was confirmed by fluoroscopy after injection with contrast material. The catheter was secured at the skin with a Prolene retention suture and StatLock device. The catheter was flushed and attached to a gravity drainage bag. FINDINGS: After access of the gallbladder, aspiration yielded purulent appearing bile. A sample was sent for culture. After placement of the cholecystostomy tube, there is return of thick, purulent appearing fluid. IMPRESSION: Percutaneous cholecystostomy tube placement for drainage of the gallbladder. There was return of purulent bile from the gallbladder lumen. A sample of this fluid was sent for culture analysis. The cholecystostomy tube was attached to gravity bag drainage. Electronically Signed   By: Aletta Edouard M.D.   On: 09/22/2016 16:33   Medications:  I have reviewed the patient's current medications Scheduled Meds: . bisacodyl  10 mg Rectal BID AC  . chlorhexidine  15 mL Mouth Rinse BID  . Chlorhexidine Gluconate Cloth  6 each Topical Q0600  . feeding supplement  1 Container Oral BID BM  . feeding supplement (PRO-STAT SUGAR FREE 64)  30 mL Oral BID BM  . insulin aspart  0-9 Units Subcutaneous Q4H  . mouth rinse  15 mL Mouth Rinse q12n4p  . metoprolol tartrate  25 mg Oral BID  . mupirocin ointment   Nasal BID  . piperacillin-tazobactam (ZOSYN)  IV  3.375 g Intravenous Q8H  . polyethylene glycol  17 g Oral Daily  . potassium chloride  20 mEq Oral Q6H   Or  . potassium chloride  20 mEq Oral Q6H   Continuous Infusions: PRN Meds:.acetaminophen, ondansetron **OR** ondansetron (ZOFRAN) IV, traMADol   Assessment/Plan: #1. Acute cholecystitis. Status post cholecystostomy with drainage of purulent bilious fluid. Continue Zosyn.  Culture pending. Discussed with surgery. We'll discontinue morphine and use tramadol or Tylenol if needed for pain. #2. Diabetes. Fasting glucose pending. Glucose 184 last night. Continue carb modified diet. #3. Hypokalemia. Supplement additionally today and recheck tomorrow. #4. Paroxysmal atrial fibrillation. Maintaining sinus rhythm. Principal Problem:   Acute cholecystitis Active Problems:   Mild dementia   Parkinsonism ([place])   Hyponatremia   RUQ abdominal pain   History of right bundle branch block   Diabetes ([place])   Stroke ([place]) history   Pressure injury of skin     LOS: 6 days   Venezia Sargeant 09/23/2016, 7:41 [id number]

## 2016-09-23 NOTE — Progress Notes (Addendum)
Pt has had frequent PVC's q 3 or 4 beats.  He has been given morphine once this shift for severe pain. He states that he does not want any more because it does not do him any good.  Dr Ilsa Iha on call for Dr Willey Blade informed.

## 2016-09-24 ENCOUNTER — Other Ambulatory Visit (HOSPITAL_COMMUNITY): Payer: Self-pay | Admitting: General Surgery

## 2016-09-24 DIAGNOSIS — K810 Acute cholecystitis: Secondary | ICD-10-CM

## 2016-09-24 LAB — BASIC METABOLIC PANEL
Anion gap: 5 (ref 5–15)
BUN: 13 mg/dL (ref 6–20)
CALCIUM: 8.6 mg/dL — AB (ref 8.9–10.3)
CO2: 30 mmol/L (ref 22–32)
CREATININE: 0.51 mg/dL — AB (ref 0.61–1.24)
Chloride: 101 mmol/L (ref 101–111)
GFR calc Af Amer: >60 mL/min (ref >60)
Glucose, Bld: 152 mg/dL — ABNORMAL HIGH (ref 65–99)
Potassium: 3.3 mmol/L — ABNORMAL LOW (ref 3.5–5.1)
SODIUM: 136 mmol/L (ref 135–145)

## 2016-09-24 LAB — CBC
HCT: 36.3 % — ABNORMAL LOW (ref 39.0–52.0)
Hemoglobin: 11.9 g/dL — ABNORMAL LOW (ref 13.0–17.0)
MCH: 34 pg (ref 26.0–34.0)
MCHC: 32.8 g/dL (ref 30.0–36.0)
MCV: 103.7 fL — ABNORMAL HIGH (ref 78.0–100.0)
PLATELETS: 429 10*3/uL — AB (ref 150–400)
RBC: 3.5 MIL/uL — ABNORMAL LOW (ref 4.22–5.81)
RDW: 13.4 % (ref 11.5–15.5)
WBC: 14.6 10*3/uL — AB (ref 4.0–10.5)

## 2016-09-24 LAB — MAGNESIUM: MAGNESIUM: 1.8 mg/dL (ref 1.7–2.4)

## 2016-09-24 LAB — GLUCOSE, CAPILLARY
GLUCOSE-CAPILLARY: 115 mg/dL — AB (ref 65–99)
GLUCOSE-CAPILLARY: 296 mg/dL — AB (ref 65–99)

## 2016-09-24 MED ORDER — POTASSIUM CHLORIDE ER 10 MEQ PO TBCR: 10.0000 meq | EXTENDED_RELEASE_TABLET | Freq: Two times a day (BID) | ORAL | 0 refills | Status: DC

## 2016-09-24 MED ORDER — AMOXICILLIN-POT CLAVULANATE 875-125 MG PO TABS: 1.0000 | ORAL_TABLET | Freq: Two times a day (BID) | ORAL | 0 refills | Status: DC

## 2016-09-24 NOTE — Care Management Note (Signed)
Case Management Note  Patient Details  Name: Stephen Bates MRN: [id number] Date of Birth: [birth year]  Expected Discharge Date:  09/24/16               Expected Discharge Plan:  [place]  In-House Referral:  NA  Discharge planning Services  CM Consult  Post Acute Care Choice:  Home Health Choice offered to:  Patient    HH Arranged:  RN [facility] Agency:  [place]  Status of Service:  Completed, signed off  Additional Comments: Pt discharging home today. [place] nursing ordered. Pt requests [facility] as he has used them before. Pt/wife aware HH has 48hrs to make first visit. Blake Divine, of [facility], aware of referral and will obtain info from chart. EMS arranged for transport home. No DME needs.   Sherald Barge, RN 09/24/2016, 9:22 AM

## 2016-09-24 NOTE — Care Management Important Message (Signed)
Important Message  Patient Details  Name: MAKIH STEFANKO MRN: [id number] Date of Birth: 1927-03-08   Medicare Important Message Given:  Yes    Sherald Barge, RN 09/24/2016, 9:19 AM

## 2016-09-24 NOTE — Progress Notes (Signed)
Patient discharged home with instructions given to wife and daughter on medications and follow up visits, both family members verbalized understanding. Prescriptions sent to Pharmacy of choice documented on AVS.  Transported by [facility]  To home.

## 2016-09-24 NOTE — Discharge Summary (Signed)
Physician Discharge Summary  Stephen Bates [id number] DOB: 1926-10-27 DOA: 09/17/2016   Admit date: 09/17/2016 Discharge date: 09/24/2016  Discharge Diagnoses:  Principal Problem:   Acute cholecystitis Active Problems:   Mild dementia   Parkinsonism ([place])   Hyponatremia   RUQ abdominal pain   History of right bundle branch block   Diabetes ([place])   Stroke ([place]) history   Pressure injury of skin    Wt Readings from Last 3 Encounters:  09/24/16 146 lb 2.6 oz (66.3 kg)  08/26/15 126 lb 4.8 oz (57.3 kg)  05/25/15 124 lb 14.4 oz (56.7 kg)     Hospital Course:  This patient is an 83 year old bedbound male with diabetes  who presented with abdominal pain. He had a marked leukocytosis and findings of cholecystitis on CT. He was treated with Zosyn. He was seen in surgical consultation by Dr. Arnoldo Morale. He required only cystostomy tube placement by interventional radiology draining purulent bile. His leukocytosis has improved. His fever has resolved. His abdominal pain has resolved. He was much improved and stable for discharge on the morning of March 21. He will be followed with home health nursing for management of his tube.  Diabetes was treated with sliding scale NovoLog. Metformin was held.  He developed paroxysmal atrial fibrillation felt due to physiologic stress which resolved spontaneously. He was treated with metoprolol. He was euthyroid with a normal TSH. His echo revealed normal left ventricular function with no significant valvular abnormalities.  Hypokalemia was treated with supplemental potassium. He will require additional potassium supplements for another 5 days after discharge.  He will be rechecked at home in the near future. He will continue metformin for his diabetes. He'll continue benazepril and aspirin.   Discharge Instructions  Discharge Instructions    Diet - low sodium heart healthy    Complete by:  As directed    Increase activity slowly    Complete by:   As directed      Allergies as of 09/24/2016   No Known Allergies     Medication List    TAKE these medications   acetaminophen 325 MG tablet Commonly known as:  TYLENOL Take 650 mg by mouth once as needed for mild pain or moderate pain.   amoxicillin-clavulanate 875-125 MG tablet Commonly known as:  AUGMENTIN Take 1 tablet by mouth 2 (two) times daily.   aspirin EC 81 MG tablet Take 81 mg by mouth daily.   benazepril 40 MG tablet Commonly known as:  LOTENSIN Take 1 tablet by mouth every morning.   CENTRUM SILVER ADULT 50+ PO Take 1 tablet by mouth every morning.   docusate sodium 100 MG capsule Commonly known as:  COLACE Take 100 mg by mouth daily as needed for mild constipation.   metFORMIN 500 MG 24 hr tablet Commonly known as:  GLUCOPHAGE-XR Take 500 mg by mouth 2 (two) times daily.   mirtazapine 30 MG tablet Commonly known as:  REMERON Take 30 mg by mouth at bedtime.   polyethylene glycol packet Commonly known as:  MIRALAX / GLYCOLAX Take 17 g by mouth daily as needed for mild constipation.   potassium chloride 10 MEQ tablet Commonly known as:  K-DUR Take 1 tablet (10 mEq total) by mouth 2 (two) times daily.   triamcinolone cream 0.1 % Commonly known as:  KENALOG Apply 1 application topically 2 (two) times daily. To head        Aurel Nguyen 09/24/2016

## 2016-09-26 ENCOUNTER — Telehealth: Payer: Self-pay | Admitting: Physician Assistant

## 2016-09-26 DIAGNOSIS — K810 Acute cholecystitis: Secondary | ICD-10-CM | POA: Diagnosis not present

## 2016-09-26 DIAGNOSIS — Z85038 Personal history of other malignant neoplasm of large intestine: Secondary | ICD-10-CM | POA: Diagnosis not present

## 2016-09-26 DIAGNOSIS — E1142 Type 2 diabetes mellitus with diabetic polyneuropathy: Secondary | ICD-10-CM | POA: Diagnosis not present

## 2016-09-26 DIAGNOSIS — I480 Paroxysmal atrial fibrillation: Secondary | ICD-10-CM | POA: Diagnosis not present

## 2016-09-26 DIAGNOSIS — F0280 Dementia in other diseases classified elsewhere without behavioral disturbance: Secondary | ICD-10-CM | POA: Diagnosis not present

## 2016-09-26 DIAGNOSIS — F17219 Nicotine dependence, cigarettes, with unspecified nicotine-induced disorders: Secondary | ICD-10-CM | POA: Diagnosis not present

## 2016-09-26 DIAGNOSIS — G20 Parkinson's disease: Secondary | ICD-10-CM | POA: Diagnosis not present

## 2016-09-26 DIAGNOSIS — Z438 Encounter for attention to other artificial openings: Secondary | ICD-10-CM | POA: Diagnosis not present

## 2016-09-26 DIAGNOSIS — Z4800 Encounter for change or removal of nonsurgical wound dressing: Secondary | ICD-10-CM | POA: Diagnosis not present

## 2016-09-26 DIAGNOSIS — Z5181 Encounter for therapeutic drug level monitoring: Secondary | ICD-10-CM | POA: Diagnosis not present

## 2016-09-26 DIAGNOSIS — Z7984 Long term (current) use of oral hypoglycemic drugs: Secondary | ICD-10-CM | POA: Diagnosis not present

## 2016-09-26 DIAGNOSIS — S31609D Unspecified open wound of abdominal wall, unspecified quadrant with penetration into peritoneal cavity, subsequent encounter: Secondary | ICD-10-CM | POA: Diagnosis not present

## 2016-09-26 DIAGNOSIS — S0100XD Unspecified open wound of scalp, subsequent encounter: Secondary | ICD-10-CM | POA: Diagnosis not present

## 2016-09-26 DIAGNOSIS — I10 Essential (primary) hypertension: Secondary | ICD-10-CM | POA: Diagnosis not present

## 2016-09-26 DIAGNOSIS — Z7401 Bed confinement status: Secondary | ICD-10-CM | POA: Diagnosis not present

## 2016-09-26 DIAGNOSIS — I4510 Unspecified right bundle-branch block: Secondary | ICD-10-CM | POA: Diagnosis not present

## 2016-09-26 DIAGNOSIS — Z8546 Personal history of malignant neoplasm of prostate: Secondary | ICD-10-CM | POA: Diagnosis not present

## 2016-09-26 NOTE — Telephone Encounter (Signed)
Telephone call.  Patient reports continued nausea and requested Rx for Zofran 8 mg  Called into [facility] in [place]. One PO q 8 hours prn Nausea  #20 no refills.  Also asked about flushing perc chole. No need as long as it is draining well.  WENDY S BLAIR PA-C 09/26/2016 12:01 PM

## 2016-09-27 LAB — AEROBIC/ANAEROBIC CULTURE (SURGICAL/DEEP WOUND): SPECIAL REQUESTS: NORMAL

## 2016-09-27 LAB — AEROBIC/ANAEROBIC CULTURE W GRAM STAIN (SURGICAL/DEEP WOUND)

## 2016-09-29 ENCOUNTER — Emergency Department (HOSPITAL_COMMUNITY): Payer: Medicare Other

## 2016-09-29 ENCOUNTER — Encounter (HOSPITAL_COMMUNITY): Payer: Self-pay

## 2016-09-29 ENCOUNTER — Inpatient Hospital Stay (HOSPITAL_COMMUNITY)
Admission: EM | Admit: 2016-09-29 | Discharge: 2016-10-15 | DRG: 871 | Disposition: A | Discharge status: Home / Self Care | Payer: Medicare Other | Attending: Internal Medicine | Admitting: Internal Medicine

## 2016-09-29 DIAGNOSIS — Z7984 Long term (current) use of oral hypoglycemic drugs: Secondary | ICD-10-CM | POA: Diagnosis not present

## 2016-09-29 DIAGNOSIS — J90 Pleural effusion, not elsewhere classified: Secondary | ICD-10-CM

## 2016-09-29 DIAGNOSIS — Z8679 Personal history of other diseases of the circulatory system: Secondary | ICD-10-CM

## 2016-09-29 DIAGNOSIS — I454 Nonspecific intraventricular block: Secondary | ICD-10-CM | POA: Diagnosis present

## 2016-09-29 DIAGNOSIS — E876 Hypokalemia: Secondary | ICD-10-CM | POA: Diagnosis present

## 2016-09-29 DIAGNOSIS — E119 Type 2 diabetes mellitus without complications: Secondary | ICD-10-CM

## 2016-09-29 DIAGNOSIS — Z6820 Body mass index (BMI) 20.0-20.9, adult: Secondary | ICD-10-CM

## 2016-09-29 DIAGNOSIS — R6521 Severe sepsis with septic shock: Secondary | ICD-10-CM | POA: Diagnosis not present

## 2016-09-29 DIAGNOSIS — Z9049 Acquired absence of other specified parts of digestive tract: Secondary | ICD-10-CM

## 2016-09-29 DIAGNOSIS — Y95 Nosocomial condition: Secondary | ICD-10-CM | POA: Diagnosis not present

## 2016-09-29 DIAGNOSIS — G214 Vascular parkinsonism: Secondary | ICD-10-CM | POA: Diagnosis not present

## 2016-09-29 DIAGNOSIS — E785 Hyperlipidemia, unspecified: Secondary | ICD-10-CM | POA: Diagnosis present

## 2016-09-29 DIAGNOSIS — E440 Moderate protein-calorie malnutrition: Secondary | ICD-10-CM | POA: Diagnosis not present

## 2016-09-29 DIAGNOSIS — J189 Pneumonia, unspecified organism: Secondary | ICD-10-CM

## 2016-09-29 DIAGNOSIS — R402411 Glasgow coma scale score 13-15, in the field [EMT or ambulance]: Secondary | ICD-10-CM | POA: Diagnosis not present

## 2016-09-29 DIAGNOSIS — Z87891 Personal history of nicotine dependence: Secondary | ICD-10-CM

## 2016-09-29 DIAGNOSIS — J948 Other specified pleural conditions: Secondary | ICD-10-CM | POA: Diagnosis not present

## 2016-09-29 DIAGNOSIS — M40209 Unspecified kyphosis, site unspecified: Secondary | ICD-10-CM | POA: Diagnosis present

## 2016-09-29 DIAGNOSIS — D649 Anemia, unspecified: Secondary | ICD-10-CM | POA: Diagnosis present

## 2016-09-29 DIAGNOSIS — R1310 Dysphagia, unspecified: Secondary | ICD-10-CM | POA: Diagnosis present

## 2016-09-29 DIAGNOSIS — Z9889 Other specified postprocedural states: Secondary | ICD-10-CM | POA: Diagnosis not present

## 2016-09-29 DIAGNOSIS — M5416 Radiculopathy, lumbar region: Secondary | ICD-10-CM | POA: Diagnosis present

## 2016-09-29 DIAGNOSIS — G8191 Hemiplegia, unspecified affecting right dominant side: Secondary | ICD-10-CM | POA: Diagnosis present

## 2016-09-29 DIAGNOSIS — E11649 Type 2 diabetes mellitus with hypoglycemia without coma: Secondary | ICD-10-CM | POA: Diagnosis present

## 2016-09-29 DIAGNOSIS — Z7189 Other specified counseling: Secondary | ICD-10-CM | POA: Diagnosis not present

## 2016-09-29 DIAGNOSIS — M549 Dorsalgia, unspecified: Secondary | ICD-10-CM | POA: Diagnosis present

## 2016-09-29 DIAGNOSIS — R0602 Shortness of breath: Secondary | ICD-10-CM | POA: Diagnosis not present

## 2016-09-29 DIAGNOSIS — M1990 Unspecified osteoarthritis, unspecified site: Secondary | ICD-10-CM | POA: Diagnosis present

## 2016-09-29 DIAGNOSIS — Z515 Encounter for palliative care: Secondary | ICD-10-CM | POA: Diagnosis not present

## 2016-09-29 DIAGNOSIS — Z66 Do not resuscitate: Secondary | ICD-10-CM | POA: Diagnosis present

## 2016-09-29 DIAGNOSIS — Z431 Encounter for attention to gastrostomy: Secondary | ICD-10-CM | POA: Diagnosis not present

## 2016-09-29 DIAGNOSIS — R000 Tachycardia, unspecified: Secondary | ICD-10-CM | POA: Diagnosis present

## 2016-09-29 DIAGNOSIS — Z7982 Long term (current) use of aspirin: Secondary | ICD-10-CM | POA: Diagnosis not present

## 2016-09-29 DIAGNOSIS — A419 Sepsis, unspecified organism: Principal | ICD-10-CM

## 2016-09-29 DIAGNOSIS — R64 Cachexia: Secondary | ICD-10-CM | POA: Diagnosis not present

## 2016-09-29 DIAGNOSIS — Z800 Family history of malignant neoplasm of digestive organs: Secondary | ICD-10-CM

## 2016-09-29 DIAGNOSIS — G20 Parkinson's disease: Secondary | ICD-10-CM | POA: Diagnosis present

## 2016-09-29 DIAGNOSIS — Z7401 Bed confinement status: Secondary | ICD-10-CM | POA: Diagnosis not present

## 2016-09-29 DIAGNOSIS — R630 Anorexia: Secondary | ICD-10-CM | POA: Diagnosis not present

## 2016-09-29 DIAGNOSIS — J9600 Acute respiratory failure, unspecified whether with hypoxia or hypercapnia: Secondary | ICD-10-CM

## 2016-09-29 DIAGNOSIS — Z8546 Personal history of malignant neoplasm of prostate: Secondary | ICD-10-CM | POA: Diagnosis not present

## 2016-09-29 DIAGNOSIS — I6789 Other cerebrovascular disease: Secondary | ICD-10-CM | POA: Diagnosis not present

## 2016-09-29 DIAGNOSIS — G9341 Metabolic encephalopathy: Secondary | ICD-10-CM | POA: Diagnosis present

## 2016-09-29 DIAGNOSIS — J9601 Acute respiratory failure with hypoxia: Secondary | ICD-10-CM | POA: Diagnosis not present

## 2016-09-29 DIAGNOSIS — R52 Pain, unspecified: Secondary | ICD-10-CM | POA: Diagnosis not present

## 2016-09-29 DIAGNOSIS — E46 Unspecified protein-calorie malnutrition: Secondary | ICD-10-CM | POA: Diagnosis not present

## 2016-09-29 DIAGNOSIS — G9340 Encephalopathy, unspecified: Secondary | ICD-10-CM | POA: Diagnosis not present

## 2016-09-29 DIAGNOSIS — Z4659 Encounter for fitting and adjustment of other gastrointestinal appliance and device: Secondary | ICD-10-CM

## 2016-09-29 DIAGNOSIS — K8000 Calculus of gallbladder with acute cholecystitis without obstruction: Secondary | ICD-10-CM | POA: Diagnosis present

## 2016-09-29 DIAGNOSIS — F0390 Unspecified dementia without behavioral disturbance: Secondary | ICD-10-CM | POA: Diagnosis present

## 2016-09-29 DIAGNOSIS — G8929 Other chronic pain: Secondary | ICD-10-CM | POA: Diagnosis present

## 2016-09-29 DIAGNOSIS — Z79899 Other long term (current) drug therapy: Secondary | ICD-10-CM | POA: Diagnosis not present

## 2016-09-29 DIAGNOSIS — R05 Cough: Secondary | ICD-10-CM | POA: Diagnosis not present

## 2016-09-29 DIAGNOSIS — R110 Nausea: Secondary | ICD-10-CM | POA: Diagnosis not present

## 2016-09-29 DIAGNOSIS — E1165 Type 2 diabetes mellitus with hyperglycemia: Secondary | ICD-10-CM | POA: Diagnosis present

## 2016-09-29 DIAGNOSIS — R4701 Aphasia: Secondary | ICD-10-CM | POA: Diagnosis present

## 2016-09-29 DIAGNOSIS — I639 Cerebral infarction, unspecified: Secondary | ICD-10-CM | POA: Diagnosis not present

## 2016-09-29 DIAGNOSIS — B999 Unspecified infectious disease: Secondary | ICD-10-CM | POA: Diagnosis not present

## 2016-09-29 DIAGNOSIS — Z7901 Long term (current) use of anticoagulants: Secondary | ICD-10-CM

## 2016-09-29 DIAGNOSIS — E1140 Type 2 diabetes mellitus with diabetic neuropathy, unspecified: Secondary | ICD-10-CM | POA: Diagnosis present

## 2016-09-29 DIAGNOSIS — F03A0 Unspecified dementia, mild, without behavioral disturbance, psychotic disturbance, mood disturbance, and anxiety: Secondary | ICD-10-CM | POA: Diagnosis present

## 2016-09-29 DIAGNOSIS — I480 Paroxysmal atrial fibrillation: Secondary | ICD-10-CM | POA: Diagnosis present

## 2016-09-29 DIAGNOSIS — I482 Chronic atrial fibrillation: Secondary | ICD-10-CM | POA: Diagnosis not present

## 2016-09-29 DIAGNOSIS — R6520 Severe sepsis without septic shock: Secondary | ICD-10-CM | POA: Diagnosis not present

## 2016-09-29 DIAGNOSIS — Z8673 Personal history of transient ischemic attack (TIA), and cerebral infarction without residual deficits: Secondary | ICD-10-CM | POA: Diagnosis not present

## 2016-09-29 DIAGNOSIS — R296 Repeated falls: Secondary | ICD-10-CM | POA: Diagnosis present

## 2016-09-29 DIAGNOSIS — Z4682 Encounter for fitting and adjustment of non-vascular catheter: Secondary | ICD-10-CM | POA: Diagnosis not present

## 2016-09-29 DIAGNOSIS — I10 Essential (primary) hypertension: Secondary | ICD-10-CM | POA: Diagnosis present

## 2016-09-29 DIAGNOSIS — Z8052 Family history of malignant neoplasm of bladder: Secondary | ICD-10-CM

## 2016-09-29 DIAGNOSIS — E138 Other specified diabetes mellitus with unspecified complications: Secondary | ICD-10-CM | POA: Diagnosis not present

## 2016-09-29 LAB — CBC WITH DIFFERENTIAL/PLATELET
BASOS PCT: 0 %
Basophils Absolute: 0 10*3/uL (ref 0.0–0.1)
Eosinophils Absolute: 0 10*3/uL (ref 0.0–0.7)
Eosinophils Relative: 0 %
HEMATOCRIT: 35.7 % — AB (ref 39.0–52.0)
HEMOGLOBIN: 11.5 g/dL — AB (ref 13.0–17.0)
Lymphocytes Relative: 9 %
Lymphs Abs: 0.9 10*3/uL (ref 0.7–4.0)
MCH: 33.8 pg (ref 26.0–34.0)
MCHC: 32.2 g/dL (ref 30.0–36.0)
MCV: 105 fL — ABNORMAL HIGH (ref 78.0–100.0)
MONO ABS: 0.7 10*3/uL (ref 0.1–1.0)
Monocytes Relative: 7 %
NEUTROS ABS: 8.8 10*3/uL — AB (ref 1.7–7.7)
NEUTROS PCT: 84 %
Platelets: 687 10*3/uL — ABNORMAL HIGH (ref 150–400)
RBC: 3.4 MIL/uL — ABNORMAL LOW (ref 4.22–5.81)
RDW: 13.2 % (ref 11.5–15.5)
WBC: 10.4 10*3/uL (ref 4.0–10.5)

## 2016-09-29 LAB — BLOOD GAS, ARTERIAL
ACID-BASE DEFICIT: 1.2 mmol/L (ref 0.0–2.0)
Acid-base deficit: 0.2 mmol/L (ref 0.0–2.0)
Acid-base deficit: 2.8 mmol/L — ABNORMAL HIGH (ref 0.0–2.0)
Bicarbonate: 23.8 mmol/L (ref 20.0–28.0)
Bicarbonate: 22.1 mmol/L (ref 20.0–28.0)
Bicarbonate: 23.5 mmol/L (ref 20.0–28.0)
DRAWN BY: 277331
DRAWN BY: 277331
Drawn by: 21310
FIO2: 100
FIO2: 0.4
FIO2: 40
MECHVT: 480 mL
MECHVT: 480 mL
O2 CONTENT: 15 L/min
O2 SAT: 97.1 %
O2 Saturation: 99.5 %
O2 Saturation: 97.1 %
PEEP/CPAP: 5 cmH2O
PEEP: 5 cmH2O
PH ART: 7.334 — AB (ref 7.350–7.450)
PH ART: 7.396 (ref 7.350–7.450)
PO2 ART: 99.1 mmHg (ref 83.0–108.0)
Patient temperature: 37
Patient temperature: 36.6
Patient temperature: 37
RATE: 14 resp/min
RATE: 14 resp/min
pCO2 arterial: 48.1 mmHg — ABNORMAL HIGH (ref 32.0–48.0)
pCO2 arterial: 37.8 mmHg (ref 32.0–48.0)
pCO2 arterial: 38.3 mmHg (ref 32.0–48.0)
pH, Arterial: 7.375 (ref 7.350–7.450)
pO2, Arterial: 224 mmHg — ABNORMAL HIGH (ref 83.0–108.0)
pO2, Arterial: 100 mmHg (ref 83.0–108.0)

## 2016-09-29 LAB — COMPREHENSIVE METABOLIC PANEL
ALBUMIN: 2.4 g/dL — AB (ref 3.5–5.0)
ALT: 18 U/L (ref 17–63)
AST: 16 U/L (ref 15–41)
Alkaline Phosphatase: 61 U/L (ref 38–126)
Anion gap: 12 (ref 5–15)
BILIRUBIN TOTAL: 1.9 mg/dL — AB (ref 0.3–1.2)
BUN: 13 mg/dL (ref 6–20)
CO2: 25 mmol/L (ref 22–32)
Calcium: 8.7 mg/dL — ABNORMAL LOW (ref 8.9–10.3)
Chloride: 102 mmol/L (ref 101–111)
Creatinine, Ser: 0.61 mg/dL (ref 0.61–1.24)
GFR calc Af Amer: >60 mL/min (ref >60)
GFR calc non Af Amer: >60 mL/min (ref >60)
GLUCOSE: 229 mg/dL — AB (ref 65–99)
POTASSIUM: 4 mmol/L (ref 3.5–5.1)
Sodium: 139 mmol/L (ref 135–145)
TOTAL PROTEIN: 6.4 g/dL — AB (ref 6.5–8.1)

## 2016-09-29 LAB — URINALYSIS, ROUTINE W REFLEX MICROSCOPIC
Bilirubin Urine: NEGATIVE
Glucose, UA: 50 mg/dL — AB
Hgb urine dipstick: NEGATIVE
KETONES UR: 80 mg/dL — AB
Nitrite: NEGATIVE
PROTEIN: 30 mg/dL — AB
Specific Gravity, Urine: 1.017 (ref 1.005–1.030)
pH: 6 (ref 5.0–8.0)

## 2016-09-29 LAB — LACTIC ACID, PLASMA
Lactic Acid, Venous: 0.9 mmol/L (ref 0.5–1.9)
Lactic Acid, Venous: 2.8 mmol/L (ref 0.5–1.9)

## 2016-09-29 LAB — TROPONIN I: TROPONIN I: 0.07 ng/mL — AB (ref <0.03)

## 2016-09-29 LAB — AMMONIA: AMMONIA: 34 umol/L (ref 9–35)

## 2016-09-29 LAB — BRAIN NATRIURETIC PEPTIDE: B Natriuretic Peptide: 1149 pg/mL — ABNORMAL HIGH (ref 0.0–100.0)

## 2016-09-29 LAB — INFLUENZA PANEL BY PCR (TYPE A & B)
INFLAPCR: NEGATIVE
INFLBPCR: NEGATIVE

## 2016-09-29 MED ORDER — NOREPINEPHRINE 4 MG/250ML-% IV SOLN
INTRAVENOUS | Status: AC
Start: 2016-09-29 — End: 2016-09-29
  Filled 2016-09-29: qty 250

## 2016-09-29 MED ORDER — DEXTROSE 5 % IV SOLN
0.0000 ug/min | INTRAVENOUS | Status: DC
  Administered 2016-09-29 (×2): 10 ug/min via INTRAVENOUS
  Administered 2016-09-30: 11 ug/min via INTRAVENOUS
  Administered 2016-09-30: 10 ug/min via INTRAVENOUS
  Administered 2016-10-01: 6 ug/min via INTRAVENOUS
  Filled 2016-09-29 (×4): qty 4

## 2016-09-29 MED ORDER — MIDAZOLAM HCL 2 MG/2ML IJ SOLN
1.0000 mg | INTRAMUSCULAR | Status: DC | PRN
  Administered 2016-09-29 (×2): 1 mg via INTRAVENOUS
  Filled 2016-09-29: qty 2

## 2016-09-29 MED ORDER — SODIUM CHLORIDE 0.9 % IV BOLUS (SEPSIS)
1000.0000 mL | Freq: Once | INTRAVENOUS | Status: AC
  Administered 2016-09-29: 1000 mL via INTRAVENOUS

## 2016-09-29 MED ORDER — FENTANYL BOLUS VIA INFUSION
25.0000 ug | INTRAVENOUS | Status: DC | PRN
  Administered 2016-09-30 (×3): 25 ug via INTRAVENOUS
  Filled 2016-09-29: qty 25

## 2016-09-29 MED ORDER — VANCOMYCIN HCL IN DEXTROSE 1-5 GM/200ML-% IV SOLN
1000.0000 mg | Freq: Once | INTRAVENOUS | Status: AC
  Administered 2016-09-29: 1000 mg via INTRAVENOUS
  Filled 2016-09-29: qty 200

## 2016-09-29 MED ORDER — ROCURONIUM BROMIDE 50 MG/5ML IV SOLN
INTRAVENOUS | Status: AC | PRN
  Administered 2016-09-29: 100 mg via INTRAVENOUS

## 2016-09-29 MED ORDER — MIDAZOLAM HCL 2 MG/2ML IJ SOLN: 1.0000 mg | INTRAMUSCULAR | Status: DC | PRN

## 2016-09-29 MED ORDER — DEXTROSE-NACL 5-0.45 % IV SOLN
INTRAVENOUS | Status: DC
  Administered 2016-09-29 – 2016-09-30 (×2): via INTRAVENOUS

## 2016-09-29 MED ORDER — SODIUM CHLORIDE 0.9 % IV SOLN
25.0000 ug/h | INTRAVENOUS | Status: DC
  Administered 2016-09-29: 25 ug/h via INTRAVENOUS
  Filled 2016-09-29 (×2): qty 50

## 2016-09-29 MED ORDER — FENTANYL CITRATE (PF) 100 MCG/2ML IJ SOLN
50.0000 ug | Freq: Once | INTRAMUSCULAR | Status: AC
  Administered 2016-09-29: 50 ug via INTRAVENOUS
  Filled 2016-09-29: qty 2

## 2016-09-29 MED ORDER — ETOMIDATE 2 MG/ML IV SOLN
INTRAVENOUS | Status: AC | PRN
  Administered 2016-09-29: 30 mg via INTRAVENOUS

## 2016-09-29 MED ORDER — PIPERACILLIN-TAZOBACTAM 3.375 G IVPB 30 MIN
3.3750 g | Freq: Once | INTRAVENOUS | Status: AC
  Administered 2016-09-29: 3.375 g via INTRAVENOUS
  Filled 2016-09-29: qty 50

## 2016-09-29 MED ORDER — PHENYLEPHRINE 40 MCG/ML (10ML) SYRINGE FOR IV PUSH (FOR BLOOD PRESSURE SUPPORT)
200.0000 ug | PREFILLED_SYRINGE | Freq: Once | INTRAVENOUS | Status: AC
  Administered 2016-09-29: 120 ug via INTRAVENOUS
  Filled 2016-09-29: qty 10

## 2016-09-29 MED ORDER — SODIUM CHLORIDE 0.9 % IV SOLN
0.0000 ug/min | INTRAVENOUS | Status: DC
  Filled 2016-09-29: qty 4

## 2016-09-29 NOTE — ED Provider Notes (Signed)
[place] [facility] DEPT Provider Note   CSN: [id number] Arrival date & time: 09/29/16  1432     History   Chief Complaint Chief Complaint  Patient presents with  . Shortness of Breath    HPI Stephen Bates is a 83 y.o. male.  The history is provided by a caregiver, medical records and the EMS personnel.   History obtained from his daughter. Patient was in the hospital until a few days ago for cholecystitis. He had a drain placed. Home and had been improving however had had intermittent altered mental status but overall was doing well. Yesterday the daughter went home and today she started to methadone he has on right since she came over to see him and he was found on floor minimally responsive. EMS got there and found to be hypoxic and started on oxygen and initially his responsiveness improved however on arrival here he is not able to help with history. After discussion with the daughter she states that she's tried to have end-of-life discussion with him and the wife muscle times a day both desire to be full code and would want to be on life support if needed.  LEVEL V CAVEAT 2/2 ALTERED MENTAL STATUS  Past Medical History:  Diagnosis Date  . Arthritis   . Benign neoplasm of colon   . Chronic back pain   . Diabetes ([place])   . Frequent falls 2012  . History of right bundle branch block   . Hypertension   . Lumbar radiculopathy   . Mild dementia   . Neuropathy ([place])   . Prostate cancer ([place])   . Stroke ([place]) 1982  . Weakness of both legs   . Wears glasses     Patient Active Problem List   Diagnosis Date Noted  . Pressure injury of skin 09/18/2016  . History of right bundle branch block   . Diabetes ([place])   . Cholecystitis 08/24/2015  . Acute cholecystitis 08/24/2015  . Malnutrition of moderate degree 05/24/2015  . Pressure ulcer 05/23/2015  . RUQ abdominal pain 05/22/2015  . 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 CAP (community acquired pneumonia) 01/05/2014  .  Hyponatremia 01/03/2014  . Ileus ([place]) 01/03/2014  . Dehydration, mild 01/03/2014  . Mild dementia 11/29/2013  . Parkinsonism ([place]) 11/29/2013  . Gait difficulty 11/29/2013  . Neuropathy ([place]) 11/29/2013  . Memory loss 11/29/2013  . Stroke [facility]) history 07/07/1980    Past Surgical History:  Procedure Laterality Date  . COLONOSCOPY    . ERCP N/A 05/24/2015   Procedure: ENDOSCOPIC RETROGRADE CHOLANGIOPANCREATOGRAPHY;  Surgeon: Rogene Houston, MD;  Location: [facility] ORS;  Service: Endoscopy;  Laterality: N/A;  . EYE SURGERY  2010   both cataracts  . INSERTION PROSTATE RADIATION SEED  2001  . IR GENERIC HISTORICAL  09/22/2016   IR PERC CHOLECYSTOSTOMY 09/22/2016 Aletta Edouard, MD [facility]-INTERV RAD  . kyphoplasty    . MOHS SURGERY  12/12/2008  . SPHINCTEROTOMY N/A 05/24/2015   Procedure: BILIARY SPHINCTEROTOMY;  Surgeon: Rogene Houston, MD;  Location: [facility] ORS;  Service: Endoscopy;  Laterality: N/A;       Home Medications    Prior to Admission medications   Medication Sig Start Date End Date Taking? Authorizing Provider  acetaminophen (TYLENOL) 325 MG tablet Take 650 mg by mouth once as needed for mild pain or moderate pain.    Historical Provider, MD  amoxicillin-clavulanate (AUGMENTIN) 875-125 MG tablet Take 1 tablet by mouth 2 (two) times daily. 09/24/16   Asencion Noble, MD  aspirin EC 81 MG tablet Take 81 mg by mouth daily.    Historical Provider, MD  benazepril (LOTENSIN) 40 MG tablet Take 1 tablet by mouth every morning.  10/04/13   Historical Provider, MD  docusate sodium (COLACE) 100 MG capsule Take 100 mg by mouth daily as needed for mild constipation.     Historical Provider, MD  metFORMIN (GLUCOPHAGE-XR) 500 MG 24 hr tablet Take 500 mg by mouth 2 (two) times daily.  08/22/15   Historical Provider, MD  mirtazapine (REMERON) 30 MG tablet Take 30 mg by mouth at bedtime.    Historical Provider, MD  Multiple Vitamins-Minerals (CENTRUM SILVER ADULT 50+ PO) Take 1 tablet by mouth every  morning.    Historical Provider, MD  polyethylene glycol (MIRALAX / GLYCOLAX) packet Take 17 g by mouth daily as needed for mild constipation.     Historical Provider, MD  potassium chloride (K-DUR) 10 MEQ tablet Take 1 tablet (10 mEq total) by mouth 2 (two) times daily. 09/24/16   Asencion Noble, MD  triamcinolone cream (KENALOG) 0.1 % Apply 1 application topically 2 (two) times daily. To head 09/09/16   Historical Provider, MD    Family History Family History  Problem Relation Age of Onset  . Bladder Cancer Father   . Bronchopulmonary dysplasia Mother   . Colon cancer Sister     Social History Social History  Substance Use Topics  . Smoking status: Former Smoker    Quit date: 06/08/1969  . Smokeless tobacco: Never Used  . Alcohol use 1.8 oz/week    3 Glasses of wine per week     Comment: "3 glasses of wine a day"     Allergies   Patient has no known allergies.   Review of Systems Review of Systems  Unable to perform ROS: Mental status change  Constitutional: Positive for activity change.   LEVEL V CAVEAT 2/2 ALTERED MENTAL STATUS  Physical Exam Updated Vital Signs BP (!) 143/82   Pulse 88   Temp 97.7 F (36.5 C) (Other (Comment)) Comment (Src): temp foley  Resp 15   SpO2 (P) 100%   Physical Exam  Constitutional: He appears lethargic.  HENT:  Head: Normocephalic.  Multiple wounds on his head without evidence of infection  Eyes: Conjunctivae and EOM are normal.  Neck:  Significantly decreased ROM of neck  Cardiovascular: An irregular rhythm present. Tachycardia present.   Pulmonary/Chest: Accessory muscle usage present. Tachypnea noted. He is in respiratory distress. He has decreased breath sounds. He has rhonchi. He has rales.  Abdominal: Soft. He exhibits no distension.  Area around ruq drain c/d/i  Musculoskeletal: He exhibits no tenderness.  Severe contractdions of neck, legs  Neurological: He appears lethargic. GCS eye subscore is 2. GCS verbal subscore is 3.  GCS motor subscore is 4.  Skin: Skin is warm and dry.  Nursing note and vitals reviewed.    ED Treatments / Results  Labs (all labs ordered are listed, but only abnormal results are displayed) Labs Reviewed  CBC WITH DIFFERENTIAL/PLATELET - Abnormal; Notable for the following:       Result Value   RBC 3.40 (*)    Hemoglobin 11.5 (*)    HCT 35.7 (*)    MCV 105.0 (*)    Platelets 687 (*)    Neutro Abs 8.8 (*)    All other components within normal limits  COMPREHENSIVE METABOLIC PANEL - Abnormal; Notable for the following:    Glucose, Bld 229 (*)    Calcium 8.7 (*)  Total Protein 6.4 (*)    Albumin 2.4 (*)    Total Bilirubin 1.9 (*)    All other components within normal limits  URINALYSIS, ROUTINE W REFLEX MICROSCOPIC - Abnormal; Notable for the following:    Glucose, UA 50 (*)    Ketones, ur 80 (*)    Protein, ur 30 (*)    Leukocytes, UA TRACE (*)    Bacteria, UA RARE (*)    Squamous Epithelial / LPF 0-5 (*)    All other components within normal limits  BLOOD GAS, ARTERIAL - Abnormal; Notable for the following:    pH, Arterial 7.334 (*)    pCO2 arterial 48.1 (*)    pO2, Arterial 224 (*)    All other components within normal limits  CULTURE, BLOOD (ROUTINE X 2)  CULTURE, BLOOD (ROUTINE X 2)  LACTIC ACID, PLASMA  AMMONIA  LACTIC ACID, PLASMA  INFLUENZA PANEL BY PCR (TYPE A & B)    EKG  EKG Interpretation  Date/Time:  Monday September 29 2016 14:58:48 EDT Ventricular Rate:  111 PR Interval:    QRS Duration: 128 QT Interval:  367 QTC Calculation: 499 R Axis:   -133 Text Interpretation:  Atrial fibrillation Right bundle branch block Confirmed by Mareesa Gathright MD, Corene Cornea [id number]) on 09/29/2016 5:24:32 PM       Radiology Dg Chest Port 1 View  Result Date: 09/29/2016 CLINICAL DATA:  Intubation. EXAM: PORTABLE CHEST 1 VIEW COMPARISON:  05/23/2015 . FINDINGS: Endotracheal tube tip noted at the carina. Proximal repositioning of 3 cm should be considered. NG tube noted with tip  at the level of the lower esophagus/ upper stomach. Distal positioning of approximately 10 cm should be considered. Diffuse bilateral pulmonary infiltrates, right side greater than left. Bilateral pleural effusions. Cardiomegaly. Prior vertebroplasties. IMPRESSION: 1. Endotracheal tube tip noted at the carina. Proximal repositioning of approximately 3 cm should be considered. NG tube noted with tip at the level of the lower esophagus/upper stomach, distal position approximately 10 cm should be considered. 2. Diffuse bilateral pulmonary infiltrates right side greater than left. Bilateral pleural effusions. 3. Cardiomegaly. Critical Value/emergent results were called by telephone at the time of interpretation on 09/29/2016 at 5:01 pm to Dr. Merrily Pew , who verbally acknowledged these results. Electronically Signed   By: Marcello Moores  Register   On: 09/29/2016 17:05    Procedures .Central Line Date/Time: 09/29/2016 5:29 PM Performed by: Merrily Pew Authorized by: Merrily Pew   Consent:    Consent obtained:  Written   Consent given by:  Healthcare agent   Risks discussed:  Arterial puncture, bleeding, infection, incorrect placement and nerve damage   Alternatives discussed:  No treatment and delayed treatment Pre-procedure details:    Hand hygiene: Hand hygiene performed prior to insertion     Sterile barrier technique: All elements of maximal sterile technique followed     Skin preparation:  ChloraPrep   Skin preparation agent: Skin preparation agent completely dried prior to procedure   Anesthesia (see MAR for exact dosages):    Anesthesia method:  None Procedure details:    Location:  R femoral   Site selection rationale:  Severe neck kyphosis and contracture.   Patient position:  Flat   Procedural supplies:  Triple lumen   Catheter size:  7.5 Fr   Ultrasound guidance: yes     Sterile ultrasound techniques: Sterile gel and sterile probe covers were used     Number of attempts:  1    Successful placement: yes   Post-procedure  details:    Post-procedure:  Dressing applied and line sutured   Assessment:  Blood return through all ports and free fluid flow   Patient tolerance of procedure:  Tolerated well, no immediate complications Procedure Name: Intubation Date/Time: 09/29/2016 5:30 PM Performed by: Merrily Pew Pre-anesthesia Checklist: Patient identified and Timeout performed Oxygen Delivery Method: Simple face mask Preoxygenation: Pre-oxygenation with 100% oxygen Intubation Type: Rapid sequence Ventilation: Mask ventilation without difficulty Laryngoscope Size: Glidescope and 3 Grade View: Grade III Tube type: Subglottic suction tube Tube size: 7.5 mm Number of attempts: 1 Placement Confirmation: ETT inserted through vocal cords under direct vision and Breath sounds checked- equal and bilateral Secured at: 25 cm Tube secured with: ETT holder Dental Injury: Teeth and Oropharynx as per pre-operative assessment  Difficulty Due To: Difficulty was anticipated and Difficult Airway- due to reduced neck mobility Future Recommendations: Recommend- awake intubation Comments: Patient was easily intubated with glidescope, however if no glidescope or fiber optic scope available, I would be very hesitant to intubate with direct laryngoscopy.       CRITICAL CARE Performed by: Merrily Pew Total critical care time: 45 minutes Critical care time was exclusive of separately billable procedures and treating other patients. Critical care was necessary to treat or prevent imminent or life-threatening deterioration. Critical care was time spent personally by me on the following activities: development of treatment plan with patient and/or surrogate as well as nursing, discussions with consultants, evaluation of patient's response to treatment, examination of patient, obtaining history from patient or surrogate, ordering and performing treatments and interventions, ordering and  review of laboratory studies, ordering and review of radiographic studies, pulse oximetry and re-evaluation of patient's condition.   Medications Ordered in ED Medications  vancomycin (VANCOCIN) IVPB 1000 mg/200 mL premix (1,000 mg Intravenous New Bag/Given 09/29/16 1628)  norepinephrine (LEVOPHED) 4 mg in dextrose 5 % 250 mL (0.016 mg/mL) infusion (10 mcg/min Intravenous New Bag/Given 09/29/16 1630)  fentaNYL (SUBLIMAZE) injection 50 mcg (not administered)  fentaNYL (SUBLIMAZE) 2,500 mcg in sodium chloride 0.9 % 250 mL (10 mcg/mL) infusion (not administered)  fentaNYL (SUBLIMAZE) bolus via infusion 25 mcg (not administered)  midazolam (VERSED) injection 1 mg (not administered)  midazolam (VERSED) injection 1 mg (not administered)  sodium chloride 0.9 % bolus 1,000 mL (0 mLs Intravenous Stopped 09/29/16 1612)  piperacillin-tazobactam (ZOSYN) IVPB 3.375 g (0 g Intravenous Stopped 09/29/16 1652)  sodium chloride 0.9 % bolus 1,000 mL (1,000 mLs Intravenous New Bag/Given 09/29/16 1530)  PHENYLephrine 40 mcg/ml in normal saline Adult IV Push Syringe (120 mcg Intravenous Given 09/29/16 1557)  etomidate (AMIDATE) injection (30 mg Intravenous Given 09/29/16 1558)  rocuronium (ZEMURON) injection (100 mg Intravenous Given 09/29/16 1600)     Initial Impression / Assessment and Plan / ED Course  I have reviewed the triage vital signs and the nursing notes.  Pertinent labs & imaging results that were available during my care of the patient were reviewed by me and considered in my medical decision making (see chart for details).     Recently discharged from hospital 2/2 cholecystitis where drain was placed. Had been improving but now altered.  Initially patient talking some and protecting his airway but progressively became hypotensive and more lethargic in ER. Started on broad spectrum abx/pressors/ventilator/fluids as above.  Delayed bed assignmient/admission secondary to multiple discussions back and  forth between hospitalists at [place] [facility] to decide where the patient was best to be cared for. Family wanted everything done as apparently that had  been the wishes of mr. Witucki multiple times recently when asked. Ultimately decided to admit to [facility].   Final Clinical Impressions(s) / ED Diagnoses   Final diagnoses:  Septic shock ([place])  Healthcare-associated pneumonia  Acute respiratory failure with hypoxia (H[facility])  Encephalopathy    New Prescriptions New Prescriptions   No medications on file     Merrily Pew, MD 10/01/16 [phone]

## 2016-09-29 NOTE — ED Notes (Signed)
Carlynn Herald, daughter.  [phone]

## 2016-09-29 NOTE — ED Notes (Addendum)
Pt daughter, signed consent form for central line and intubation.  Time out performed prior to administration of first sedation medication.

## 2016-09-29 NOTE — ED Notes (Signed)
CRITICAL VALUE ALERT  Critical value received:  Lactic 2.8, troponin 0.07 Date of notification:  09/29/16  Time of notification: 1901  Critical value read back:Yes.    Nurse who received alert:  Dellis Anes  MD notified (1st page):  Dr. Dayna Barker

## 2016-09-29 NOTE — Sedation Documentation (Signed)
100 mcg given of phenylephrine given by Parthenia Ames via verbal order Dr. Dayna Barker.

## 2016-09-29 NOTE — ED Notes (Signed)
Dr. Dayna Barker attempted OG placement, coiling occurred according to MD and tube removed.

## 2016-09-29 NOTE — Progress Notes (Addendum)
**Note De-Identified  Obfuscation** Sputum collected and sent to lab.  ETT pulled back 1cm; currently 24 at lip.

## 2016-09-29 NOTE — Sedation Documentation (Signed)
Dr. Dayna Barker at bedside beginning central line insertion.

## 2016-09-29 NOTE — Progress Notes (Signed)
[place] [facility] Progress Note Patient Name: JAVIER GELL DOB: [birth year] MRN: [id number]   Date of Service  09/29/2016  HPI/Events of Note  Discussed with Dr. Shanon Brow; family and PCP request transfer to [facility] ICU  eICU Interventions  Will accept in our ICU     Intervention Category Minor Interventions: Communication with other healthcare providers and/or family  Simonne Maffucci 09/29/2016, 9:43 PM

## 2016-09-29 NOTE — ED Notes (Signed)
Time out and pre-procedure documentation performed prior to etomidate or roc administration.  Consent for procedure at bedside and signed by daughter.

## 2016-09-29 NOTE — Progress Notes (Signed)
[facility] Progress Note Patient Name: Stephen Bates DOB: 1927/03/16 MRN: [id number]   Date of Service  09/29/2016  HPI/Events of Note  Case discussed with Drs. Mesner, Karilyn Cota, chart reviewed, images reviewed. Stephen Bates is an unfortunate gentleman with dementia, history of strok, bedbound status recently admitted with cholecysytits requiring percutaneous draining.  During that hospitalization multiple progress notes document poor overall prognosis and need for palliative care involvement.  Now back in with septic shock, acute respiratory failure from pneumonia requiring mechanical ventilatory support and vasopressors for his critical illness.    eICU Interventions  Have explained to Drs. Lorin Mercy and Shanon Brow that we will accept Stephen Bates at [facility].  However given the high uncertainty of medical benefit from aggressive ICU level care, burden of transport I have asked that the physicians at [facility] discuss disposition with the patient's family.  We will be happy to admit if felt more appropriate for care to be provided in [place].     Intervention Category Minor Interventions: Communication with other healthcare providers and/or family  Simonne Maffucci 09/29/2016, 7:31 PM

## 2016-09-29 NOTE — Sedation Documentation (Signed)
100 mcg phenylephrine given by jennifer kendrick, rn via verbal order Dr. Dayna Barker.

## 2016-09-29 NOTE — ED Notes (Signed)
Dr. Shanon Brow aware that pt does not have NG/OG tube at this time.

## 2016-09-29 NOTE — Progress Notes (Signed)
Pt off ventilator and transferred over to [facility] ventilator for transport to [facility]. Report called to therapist Coralyn Mark earlier tonight.

## 2016-09-29 NOTE — Progress Notes (Signed)
After multiple discussions with both daughters and discussing pts overall very poor prognosis , and discussing with PCP dr Willey Blade, family has decided they would like transfer to [place].  Have discussed with dr [place] [facility] at [facility] who has accepted pt.  Admit to ICU bed at [facility] has been requested.  Not charge.

## 2016-09-29 NOTE — ED Notes (Signed)
Dr. Dayna Barker gave verbal order to begin levophed drip through central line in right femoral.

## 2016-09-29 NOTE — ED Notes (Addendum)
26mcg phenylephrine given to pt by jennifer kendrick via verbal order dr. Dayna Barker for hypotension

## 2016-09-29 NOTE — ED Notes (Signed)
Dr. Dayna Barker gave verbal order to discontinue CT chest.  CT notified.

## 2016-09-29 NOTE — Sedation Documentation (Signed)
Medication dose calculated and verified for roc 100 mg and etomidate 30 mg. Dose verified with jennifer kendrick,rn

## 2016-09-29 NOTE — ED Triage Notes (Signed)
Patient is approximately 10 days post cholecystectomy and presents to day with shortness of breath and altered mental status. EMS found patient to be hypoxic with a saturation of 92% and rhonchi ausculated bil over the lung fields. Patient has a wet cough that is non productive.

## 2016-09-30 ENCOUNTER — Inpatient Hospital Stay (HOSPITAL_COMMUNITY): Payer: Medicare Other

## 2016-09-30 DIAGNOSIS — R6521 Severe sepsis with septic shock: Secondary | ICD-10-CM

## 2016-09-30 DIAGNOSIS — A419 Sepsis, unspecified organism: Principal | ICD-10-CM

## 2016-09-30 DIAGNOSIS — J9601 Acute respiratory failure with hypoxia: Secondary | ICD-10-CM

## 2016-09-30 LAB — GLUCOSE, CAPILLARY
GLUCOSE-CAPILLARY: 266 mg/dL — AB (ref 65–99)
GLUCOSE-CAPILLARY: 234 mg/dL — AB (ref 65–99)
GLUCOSE-CAPILLARY: 109 mg/dL — AB (ref 65–99)
GLUCOSE-CAPILLARY: 109 mg/dL — AB (ref 65–99)
GLUCOSE-CAPILLARY: 127 mg/dL — AB (ref 65–99)
GLUCOSE-CAPILLARY: 156 mg/dL — AB (ref 65–99)
Glucose-Capillary: 124 mg/dL — ABNORMAL HIGH (ref 65–99)
Glucose-Capillary: 154 mg/dL — ABNORMAL HIGH (ref 65–99)
Glucose-Capillary: 156 mg/dL — ABNORMAL HIGH (ref 65–99)
Glucose-Capillary: 182 mg/dL — ABNORMAL HIGH (ref 65–99)
Glucose-Capillary: 199 mg/dL — ABNORMAL HIGH (ref 65–99)
Glucose-Capillary: 215 mg/dL — ABNORMAL HIGH (ref 65–99)
Glucose-Capillary: 223 mg/dL — ABNORMAL HIGH (ref 65–99)
Glucose-Capillary: 251 mg/dL — ABNORMAL HIGH (ref 65–99)
Glucose-Capillary: 357 mg/dL — ABNORMAL HIGH (ref 65–99)
Glucose-Capillary: 82 mg/dL (ref 65–99)
Glucose-Capillary: 92 mg/dL (ref 65–99)

## 2016-09-30 LAB — BASIC METABOLIC PANEL
ANION GAP: 7 (ref 5–15)
Anion gap: 6 (ref 5–15)
BUN: 7 mg/dL (ref 6–20)
BUN: 6 mg/dL (ref 6–20)
CALCIUM: 7.8 mg/dL — AB (ref 8.9–10.3)
CO2: 26 mmol/L (ref 22–32)
CO2: 23 mmol/L (ref 22–32)
CREATININE: 0.52 mg/dL — AB (ref 0.61–1.24)
Calcium: 7.7 mg/dL — ABNORMAL LOW (ref 8.9–10.3)
Chloride: 103 mmol/L (ref 101–111)
Chloride: 103 mmol/L (ref 101–111)
Creatinine, Ser: 0.65 mg/dL (ref 0.61–1.24)
Glucose, Bld: 311 mg/dL — ABNORMAL HIGH (ref 65–99)
Glucose, Bld: 152 mg/dL — ABNORMAL HIGH (ref 65–99)
POTASSIUM: 2.9 mmol/L — AB (ref 3.5–5.1)
Potassium: 4 mmol/L (ref 3.5–5.1)
SODIUM: 135 mmol/L (ref 135–145)
Sodium: 133 mmol/L — ABNORMAL LOW (ref 135–145)

## 2016-09-30 LAB — PHOSPHORUS
PHOSPHORUS: 1.1 mg/dL — AB (ref 2.5–4.6)
Phosphorus: 2.8 mg/dL (ref 2.5–4.6)
Phosphorus: 2.2 mg/dL — ABNORMAL LOW (ref 2.5–4.6)

## 2016-09-30 LAB — POCT I-STAT 3, ART BLOOD GAS (G3+)
ACID-BASE DEFICIT: 1 mmol/L (ref 0.0–2.0)
Bicarbonate: 24.4 mmol/L (ref 20.0–28.0)
O2 SAT: 97 %
PH ART: 7.34 — AB (ref 7.350–7.450)
Patient temperature: 98.6
TCO2: 26 mmol/L (ref 0–100)
pCO2 arterial: 45.2 mmHg (ref 32.0–48.0)
pO2, Arterial: 99 mmHg (ref 83.0–108.0)

## 2016-09-30 LAB — CBC
HCT: 31 % — ABNORMAL LOW (ref 39.0–52.0)
HCT: 33.9 % — ABNORMAL LOW (ref 39.0–52.0)
Hemoglobin: 10 g/dL — ABNORMAL LOW (ref 13.0–17.0)
Hemoglobin: 10.6 g/dL — ABNORMAL LOW (ref 13.0–17.0)
MCH: 33.8 pg (ref 26.0–34.0)
MCH: 32.6 pg (ref 26.0–34.0)
MCHC: 32.3 g/dL (ref 30.0–36.0)
MCHC: 31.3 g/dL (ref 30.0–36.0)
MCV: 104.7 fL — ABNORMAL HIGH (ref 78.0–100.0)
MCV: 104.3 fL — ABNORMAL HIGH (ref 78.0–100.0)
PLATELETS: 476 10*3/uL — AB (ref 150–400)
PLATELETS: 561 10*3/uL — AB (ref 150–400)
RBC: 2.96 MIL/uL — AB (ref 4.22–5.81)
RBC: 3.25 MIL/uL — ABNORMAL LOW (ref 4.22–5.81)
RDW: 13.6 % (ref 11.5–15.5)
RDW: 13.4 % (ref 11.5–15.5)
WBC: 12.3 10*3/uL — ABNORMAL HIGH (ref 4.0–10.5)
WBC: 13.5 10*3/uL — ABNORMAL HIGH (ref 4.0–10.5)

## 2016-09-30 LAB — MRSA PCR SCREENING: MRSA BY PCR: NEGATIVE

## 2016-09-30 LAB — CREATININE, SERUM
Creatinine, Ser: 0.51 mg/dL — ABNORMAL LOW (ref 0.61–1.24)
GFR calc Af Amer: >60 mL/min (ref >60)
GFR calc non Af Amer: >60 mL/min (ref >60)

## 2016-09-30 LAB — MAGNESIUM
MAGNESIUM: 1.7 mg/dL (ref 1.7–2.4)
MAGNESIUM: 2.1 mg/dL (ref 1.7–2.4)
Magnesium: 2.2 mg/dL (ref 1.7–2.4)

## 2016-09-30 LAB — LACTIC ACID, PLASMA: Lactic Acid, Venous: 2.6 mmol/L (ref 0.5–1.9)

## 2016-09-30 LAB — CORTISOL: CORTISOL PLASMA: 12.1 ug/dL

## 2016-09-30 MED ORDER — ORAL CARE MOUTH RINSE
15.0000 mL | Freq: Four times a day (QID) | OROMUCOSAL | Status: DC
  Administered 2016-09-30 – 2016-10-03 (×12): 15 mL via OROMUCOSAL

## 2016-09-30 MED ORDER — POTASSIUM PHOSPHATES 15 MMOLE/5ML IV SOLN
30.0000 mmol | Freq: Once | INTRAVENOUS | Status: AC
  Administered 2016-09-30: 30 mmol via INTRAVENOUS
  Filled 2016-09-30: qty 10

## 2016-09-30 MED ORDER — HEPARIN SODIUM (PORCINE) 5000 UNIT/ML IJ SOLN
5000.0000 [IU] | Freq: Three times a day (TID) | INTRAMUSCULAR | Status: DC
  Administered 2016-09-30 – 2016-10-04 (×14): 5000 [IU] via SUBCUTANEOUS
  Filled 2016-09-30 (×15): qty 1

## 2016-09-30 MED ORDER — INSULIN GLARGINE 100 UNIT/ML ~~LOC~~ SOLN
10.0000 [IU] | SUBCUTANEOUS | Status: DC
  Administered 2016-09-30: 10 [IU] via SUBCUTANEOUS
  Filled 2016-09-30 (×3): qty 0.1

## 2016-09-30 MED ORDER — INSULIN ASPART 100 UNIT/ML ~~LOC~~ SOLN: 2.0000 [IU] | SUBCUTANEOUS | Status: DC

## 2016-09-30 MED ORDER — VANCOMYCIN HCL 500 MG IV SOLR
500.0000 mg | Freq: Two times a day (BID) | INTRAVENOUS | Status: DC
  Administered 2016-09-30 – 2016-10-02 (×6): 500 mg via INTRAVENOUS
  Filled 2016-09-30 (×7): qty 500

## 2016-09-30 MED ORDER — DEXTROSE IN LACTATED RINGERS 5 % IV SOLN: INTRAVENOUS | Status: DC

## 2016-09-30 MED ORDER — CHLORHEXIDINE GLUCONATE 0.12% ORAL RINSE (MEDLINE KIT)
15.0000 mL | Freq: Two times a day (BID) | OROMUCOSAL | Status: DC
  Administered 2016-09-30 – 2016-10-02 (×6): 15 mL via OROMUCOSAL

## 2016-09-30 MED ORDER — FENTANYL 2500MCG IN NS 250ML (10MCG/ML) PREMIX INFUSION
25.0000 ug/h | INTRAVENOUS | Status: DC
  Administered 2016-09-30: 100 ug/h via INTRAVENOUS
  Filled 2016-09-30: qty 250

## 2016-09-30 MED ORDER — VITAL HIGH PROTEIN PO LIQD
1000.0000 mL | ORAL | Status: DC
  Administered 2016-09-30: 1000 mL
  Administered 2016-09-30: 17:00:00

## 2016-09-30 MED ORDER — SODIUM CHLORIDE 0.9 % IV BOLUS (SEPSIS)
1000.0000 mL | Freq: Once | INTRAVENOUS | Status: AC
  Administered 2016-09-30: 1000 mL via INTRAVENOUS

## 2016-09-30 MED ORDER — SODIUM CHLORIDE 0.9 % IV SOLN
INTRAVENOUS | Status: DC
  Administered 2016-09-30: 1.6 [IU]/h via INTRAVENOUS
  Filled 2016-09-30: qty 2.5

## 2016-09-30 MED ORDER — POTASSIUM CHLORIDE 20 MEQ/15ML (10%) PO SOLN
40.0000 meq | Freq: Once | ORAL | Status: AC
  Administered 2016-09-30: 40 meq
  Filled 2016-09-30: qty 30

## 2016-09-30 MED ORDER — INSULIN ASPART 100 UNIT/ML ~~LOC~~ SOLN
2.0000 [IU] | SUBCUTANEOUS | Status: DC
  Administered 2016-09-30 – 2016-10-01 (×4): 4 [IU] via SUBCUTANEOUS

## 2016-09-30 MED ORDER — ENOXAPARIN SODIUM 40 MG/0.4ML ~~LOC~~ SOLN: 40.0000 mg | SUBCUTANEOUS | Status: DC

## 2016-09-30 MED ORDER — SODIUM CHLORIDE 0.9 % IV SOLN: 250.0000 mL | INTRAVENOUS | Status: DC | PRN

## 2016-09-30 MED ORDER — LACTATED RINGERS IV BOLUS (SEPSIS)
750.0000 mL | Freq: Once | INTRAVENOUS | Status: AC
Start: 2016-09-30 — End: 2016-09-30
  Administered 2016-09-30: 750 mL via INTRAVENOUS

## 2016-09-30 MED ORDER — SODIUM CHLORIDE 0.9 % IV SOLN
0.4000 ug/kg/h | INTRAVENOUS | Status: DC
  Administered 2016-09-30 (×2): 0.4 ug/kg/h via INTRAVENOUS
  Administered 2016-10-01: 0.8 ug/kg/h via INTRAVENOUS
  Filled 2016-09-30 (×3): qty 2

## 2016-09-30 MED ORDER — CEFTAZIDIME 2 G IJ SOLR
2.0000 g | Freq: Two times a day (BID) | INTRAMUSCULAR | Status: DC
  Administered 2016-09-30 – 2016-10-02 (×6): 2 g via INTRAVENOUS
  Filled 2016-09-30 (×6): qty 2

## 2016-09-30 MED ORDER — FAMOTIDINE IN NACL 20-0.9 MG/50ML-% IV SOLN
20.0000 mg | INTRAVENOUS | Status: DC
  Administered 2016-09-30: 20 mg via INTRAVENOUS
  Filled 2016-09-30: qty 50

## 2016-09-30 MED ORDER — PRO-STAT SUGAR FREE PO LIQD
30.0000 mL | Freq: Two times a day (BID) | ORAL | Status: DC
  Administered 2016-09-30: 30 mL
  Filled 2016-09-30 (×2): qty 30

## 2016-09-30 MED ORDER — INSULIN ASPART 100 UNIT/ML ~~LOC~~ SOLN
6.0000 [IU] | Freq: Once | SUBCUTANEOUS | Status: AC
  Administered 2016-09-30: 6 [IU] via SUBCUTANEOUS

## 2016-09-30 MED ORDER — VITAL AF 1.2 CAL PO LIQD
1000.0000 mL | ORAL | Status: DC
  Administered 2016-09-30 – 2016-10-01 (×2): 1000 mL

## 2016-09-30 MED ORDER — LACTATED RINGERS IV SOLN
INTRAVENOUS | Status: DC
  Administered 2016-09-30 – 2016-10-01 (×2): via INTRAVENOUS

## 2016-09-30 MED ORDER — MAGNESIUM SULFATE 2 GM/50ML IV SOLN
2.0000 g | Freq: Once | INTRAVENOUS | Status: AC
  Administered 2016-09-30: 2 g via INTRAVENOUS
  Filled 2016-09-30: qty 50

## 2016-09-30 NOTE — H&P (Signed)
PULMONARY / CRITICAL CARE MEDICINE   Name: Stephen Bates MRN: [id number] DOB: Jul 29, 1926    ADMISSION DATE:  09/29/2016 CONSULTATION DATE:  09/29/2016  REFERRING MD:  Dr. Shanon Brow: [facility] Hospitalist  CHIEF COMPLAINT:  SOB  HISTORY OF PRESENT ILLNESS:   83 year old male with PMH as below, which is significant for DM, bedbound, dementia, prostate Ca, and CVA. He was also recently admitted 3/14 > 3/21 to [facility] for cholecystitis treated with IV antibiotics (Zosyn) and cystostomy placement. Hospital course complicated by PAF, which resolved spontaneously. He was discharged to home with home health nursing. 3/26 he presented to ED after being found unresponsive at home. He remained unresponsive in ED and was intubated for airway protection. He was also started on norepinephrine for shock. CXR concerning for multifocal PNA. [facility] was transferred to [facility].   PAST MEDICAL HISTORY :  He  has a past medical history of Arthritis; Benign neoplasm of colon; Chronic back pain; Diabetes ([place]); Frequent falls (2012); History of right bundle branch block; Hypertension; Lumbar radiculopathy; Mild dementia; Neuropathy ([place]); Prostate cancer ([place]); Stroke [facility]) (1982); Weakness of both legs; and Wears glasses.  PAST SURGICAL HISTORY: He  has a past surgical history that includes Eye surgery (2010); Colonoscopy; Insertion prostate radiation seed (2001); Mohs surgery (12/12/2008); kyphoplasty; ERCP (N/A, 05/24/2015); sphincterotomy (N/A, 05/24/2015); and ir generic historical (09/22/2016).  No Known Allergies  No current facility-administered medications on file prior to encounter.    Current Outpatient Prescriptions on File Prior to Encounter  Medication Sig  . aspirin EC 81 MG tablet Take 81 mg by mouth daily.  . mirtazapine (REMERON) 30 MG tablet Take 30 mg by mouth at bedtime.  Marland Kitchen acetaminophen (TYLENOL) 325 MG tablet Take 650 mg by mouth once as needed for mild pain or moderate pain.   Marland Kitchen amoxicillin-clavulanate (AUGMENTIN) 875-125 MG tablet Take 1 tablet by mouth 2 (two) times daily.  . benazepril (LOTENSIN) 40 MG tablet Take 1 tablet by mouth every morning.   . docusate sodium (COLACE) 100 MG capsule Take 100 mg by mouth daily as needed for mild constipation.   . metFORMIN (GLUCOPHAGE-XR) 500 MG 24 hr tablet Take 500 mg by mouth 2 (two) times daily.   . Multiple Vitamins-Minerals (CENTRUM SILVER ADULT 50+ PO) Take 1 tablet by mouth every morning.  . polyethylene glycol (MIRALAX / GLYCOLAX) packet Take 17 g by mouth daily as needed for mild constipation.   . potassium chloride (K-DUR) 10 MEQ tablet Take 1 tablet (10 mEq total) by mouth 2 (two) times daily.  Marland Kitchen triamcinolone cream (KENALOG) 0.1 % Apply 1 application topically 2 (two) times daily. To head    FAMILY HISTORY:  His indicated that his mother is deceased. He indicated that his father is deceased. He indicated that one of his two sisters is deceased. He indicated that his brother is alive. He indicated that his child is alive.    SOCIAL HISTORY: He  reports that he quit smoking about 47 years ago. He has never used smokeless tobacco. He reports that he drinks about 1.8 oz of alcohol per week . He reports that he does not use drugs.  REVIEW OF SYSTEMS:   Unable as patient is encephalopathic and intubated  SUBJECTIVE:    VITAL SIGNS: BP (!) 106/53   Pulse (!) 55   Temp 99.1 F (37.3 C)   Resp 12   SpO2 98%   HEMODYNAMICS:    VENTILATOR SETTINGS: Vent Mode: PRVC  FiO2 (%):  [40 %] 40 % Set Rate:  [14 bmp] 14 bmp Vt Set:  [480 mL-520 mL] 480 mL PEEP:  [5 cmH20] 5 cmH20 Plateau Pressure:  [16 cmH20-20 cmH20] 16 cmH20  INTAKE / OUTPUT: I/O last 3 completed shifts: In: -  Out: 375 [Urine:375]  PHYSICAL EXAMINATION: General:  Elderly male awake and agitated on vent Neuro:  Awake, alert, agitated. Follows commands HEENT:  /AT, PERRL, No JVD Cardiovascular: Mild tachy, regular rhythm, no  MRG Lungs:  Coarse Abdomen:  Soft, non-tender, non-distended Musculoskeletal:  No acute deformity or ROM limitation Skin: Grossly intact. Scaly scalp. Dressing in place from recent skin biopsy  LABS:  BMET  Recent Labs Lab 09/24/16 0554 09/29/16 1500  NA 136 139  K 3.3* 4.0  CL 101 102  CO2 30 25  BUN 13 13  CREATININE 0.51* 0.61  GLUCOSE 152* 229*    Electrolytes  Recent Labs Lab 09/24/16 0554 09/29/16 1500  CALCIUM 8.6* 8.7*  MG 1.8  --     CBC  Recent Labs Lab 09/24/16 0554 09/29/16 1500  WBC 14.6* 10.4  HGB 11.9* 11.5*  HCT 36.3* 35.7*  PLT 429* 687*    Coag's No results for input(s): APTT, INR in the last 168 hours.  Sepsis Markers  Recent Labs Lab 09/29/16 1500 09/29/16 1741  LATICACIDVEN 0.9 2.8*    ABG  Recent Labs Lab 09/29/16 1520 09/29/16 1730 09/29/16 2245  PHART 7.334* 7.375 7.396  PCO2ART 48.1* 37.8 38.3  PO2ART 224* 99.1 100.0    Liver Enzymes  Recent Labs Lab 09/29/16 1500  AST 16  ALT 18  ALKPHOS 61  BILITOT 1.9*  ALBUMIN 2.4*    Cardiac Enzymes  Recent Labs Lab 09/29/16 1726  TROPONINI 0.07*    Glucose  Recent Labs Lab 09/23/16 1142 09/23/16 1631 09/23/16 1944 09/24/16 0015 09/24/16 0748 09/30/16 0046  GLUCAP 274* 168* 139* 296* 115* 357*    Imaging Dg Chest Portable 1 View  Result Date: 09/29/2016 CLINICAL DATA:  NG tube verification EXAM: PORTABLE CHEST 1 VIEW COMPARISON:  09/29/2016 FINDINGS: Endotracheal tube 2.7 cm above the carina. Nasogastric tube with the tip projecting over the stomach, but the proximal port is above the esophagogastric junction. Recommend advancing the tube 10 cm. Bilateral small pleural effusions. Bilateral interstitial thickening. Right mid and lower lung airspace disease. Mild left lower lobe airspace disease. No pneumothorax. No acute osseous abnormality. IMPRESSION: 1. Endotracheal tube 2.7 cm above the carina. 2. Nasogastric tube with the tip projecting over the  stomach, but the proximal port is above the esophagogastric junction. Recommend advancing the tube 10 cm. 3. Findings concerning for pulmonary edema versus multi lobar pneumonia. Electronically Signed   By: Kathreen Devoid   On: 09/29/2016 17:42   Dg Chest Port 1 View  Result Date: 09/29/2016 CLINICAL DATA:  Intubation. EXAM: PORTABLE CHEST 1 VIEW COMPARISON:  05/23/2015 . FINDINGS: Endotracheal tube tip noted at the carina. Proximal repositioning of 3 cm should be considered. NG tube noted with tip at the level of the lower esophagus/ upper stomach. Distal positioning of approximately 10 cm should be considered. Diffuse bilateral pulmonary infiltrates, right side greater than left. Bilateral pleural effusions. Cardiomegaly. Prior vertebroplasties. IMPRESSION: 1. Endotracheal tube tip noted at the carina. Proximal repositioning of approximately 3 cm should be considered. NG tube noted with tip at the level of the lower esophagus/upper stomach, distal position approximately 10 cm should be considered. 2. Diffuse bilateral pulmonary infiltrates right side greater than left. Bilateral  pleural effusions. 3. Cardiomegaly. Critical Value/emergent results were called by telephone at the time of interpretation on 09/29/2016 at 5:01 pm to Dr. Merrily Pew , who verbally acknowledged these results. Electronically Signed   By: Marcello Moores  Register   On: 09/29/2016 17:05     STUDIES:    CULTURES: BCx2 3/26 > Tracheal aspirate 3/27 >  ANTIBIOTICS: Ceftaz 3/27 > Vancomycin 3/27 >  SIGNIFICANT EVENTS:   LINES/TUBES: ETT 3/26 > CVL 3/26 >  DISCUSSION:   ASSESSMENT / PLAN:  PULMONARY A: Acute hypoxemic respiratory failure HCAP ? R pleural effusion  P:   Full vent support ABG wnl, repeat in AM CXR in AM VAP bundle May need CT or ultrasound evaluation of R base.  CARDIOVASCULAR A:  Shock: septic, improved H/o HTN  P:  Telemetry monitoring Norepinephrine as needed to maintain MAP > 91mmHg  (currently off) Holding home benazepril Ensure lactic clearing  RENAL A:   No acute issues  P:   Follow CBC  GASTROINTESTINAL A:   Recent cholecystitis - cystostomy drain in place.  P:   NPO Observe drain Protonix IV for SUP  HEMATOLOGIC A:   Anemia > chronic. HGB at baseline.  P:  Heparin SQ for VTE ppx Follow CBC  INFECTIOUS A:   HCAP  Recent cholecystitis  P:   ABX as above Follow cultures  ENDOCRINE A:   DM  P:   Bolus SSI and CBG monitoring q4  NEUROLOGIC A:   Acute metabolic encephalopathy P:   RASS goal: -1 Fentanyl infusion PRN versed   FAMILY  - Updates: no family available. EDp had [place] discussion with family. Daughter has treid to convince parents to be DNR, but they both endorse full code.   - Inter-disciplinary family meet or Palliative Care meeting due by:  4/3  Georgann Housekeeper, AGACNP-BC [place] [facility]/Critical [facility] Pager [phone] or [phone]  09/30/2016 1:22 AM  STAFF NOTE: Linwood Dibbles, MD FACP have personally reviewed patient's available data, including medical history, events of note, physical examination and test results as part of my evaluation. I have discussed with resident/NP and other care providers such as pharmacist, RN and RRT. In addition, I personally evaluated patient and elicited key findings of: lesions on head old, ronchi and reduced coarse bs rt, drain abdo wnl, soft abdomen, no r/g, chronic venous stasis on legs, pcxr c/w rt PNA, r/o effusion, clinical picture of sepsis at [facility] and shock improved levophed requirements, a picture also of hypovolemia , lactic noted , needs continued fluids and repeat lactic, I did repeat assessment sepsis, MAP goal smet about to dc levophed as he is improving, maintain nosocomial ABX, may need CT chest assessment to esnure effusion contribution>?, ensure BC, sputum and drain re cultured, keep pos balance, will need goals of care discussions in am when family arrives,  would like to dc fem line ASAP, NO aline needed, abdo soft, low threhsold CT abdfo pelvis if declines, will advance OGt and repeat xray also, no family at bedside The patient is critically ill with multiple organ systems failure and requires high complexity decision making for assessment and support, frequent evaluation and titration of therapies, application of advanced monitoring technologies and extensive interpretation of multiple databases.   Critical Care Time devoted to patient care services described in this note is 45 Minutes. This time reflects time of care of this signee: Merrie Roof, MD FACP. This critical care time does not reflect procedure time, or teaching time or supervisory time of PA/NP/Med  student/Med Resident etc but could involve care discussion time. Rest per NP/medical resident whose note is outlined above and that I agree with   Lavon Paganini. Titus Mould, MD, [place] Pgr: [place] [facility] 09/30/2016 2:29 AM

## 2016-09-30 NOTE — Progress Notes (Signed)
PULMONARY / CRITICAL CARE MEDICINE   Name: Stephen Bates MRN: [id number] DOB: 09/18/1926    ADMISSION DATE:  09/29/2016 CONSULTATION DATE:  09/29/2016  REFERRING MD:  Dr. Shanon Brow: [facility] Hospitalist  CHIEF COMPLAINT:  SOB  HISTORY OF PRESENT ILLNESS:   83 year old male with PMH as below, which is significant for DM, bedbound, dementia, prostate Ca, and CVA. He was also recently admitted 3/14 > 3/21 to [facility] for cholecystitis treated with IV antibiotics (Zosyn) and cystostomy placement. Hospital course complicated by PAF, which resolved spontaneously. He was discharged to home with home health nursing. 3/26 he presented to ED after being found unresponsive at home. He remained unresponsive in ED and was intubated for airway protection. He was also started on norepinephrine for shock. CXR concerning for multifocal PNA. [facility] was [facility].    SUBJECTIVE:  Sedated on vent   VITAL SIGNS: BP (!) 94/52   Pulse 73   Temp 99.3 F (37.4 C)   Resp 14   Ht 5\' 4"  (1.626 m)   Wt 134 lb 14.7 oz (61.2 kg)   SpO2 99%   BMI 23.16 kg/m   HEMODYNAMICS:    VENTILATOR SETTINGS: Vent Mode: PRVC FiO2 (%):  [40 %] 40 % Set Rate:  [14 bmp] 14 bmp Vt Set:  [480 mL-520 mL] 480 mL PEEP:  [5 cmH20] 5 cmH20 Plateau Pressure:  [16 cmH20-21 cmH20] 20 cmH20  INTAKE / OUTPUT: I/O last 3 completed shifts: In: 2053.3 [I.V.:1343.3; IV Piggyback:710] Out: 1055 [Urine:955; Other:100]   General appearance:  83 year old  Male, sedated on vent, chronically ill appearing  Eyes: anicteric sclerae, moist conjunctivae; PERRL, EOMI bilaterally. Mouth:  membranes and no mucosal ulcerations; normal hard and soft palate Orally intubated Neck: Trachea midline; neck supple, no JVD Lungs/chest: diffuse rhonchi, decreased on right with normal respiratory effort and no intercostal retractions CV: RRR, no MRGs  Abdomen: Soft, non-tender; no masses or HSM Perg drain w/ bilious draingage   Extremities: No sig peripheral edema or extremity lymphadenopathy Skin: Normal temperature, turgor and texture; multiple skin tears & areas of ecchymosis Neuro/Psych: follows commands. Agitated at times.  LABS:  BMET  Recent Labs Lab 09/24/16 0554 09/29/16 1500 09/30/16 0321  NA 136 139 135  K 3.3* 4.0 2.9*  CL 101 102 103  CO2 30 25 26   BUN 13 13 7   CREATININE 0.51* 0.61 0.65  GLUCOSE 152* 229* 311*    Electrolytes  Recent Labs Lab 09/24/16 0554 09/29/16 1500 09/30/16 0321  CALCIUM 8.6* 8.7* 7.7*  MG 1.8  --  1.7  PHOS  --   --  1.1*    CBC  Recent Labs Lab 09/24/16 0554 09/29/16 1500 09/30/16 0321  WBC 14.6* 10.4 12.3*  HGB 11.9* 11.5* 10.0*  HCT 36.3* 35.7* 31.0*  PLT 429* 687* 476*    Coag's No results for input(s): APTT, INR in the last 168 hours.  Sepsis Markers  Recent Labs Lab 09/29/16 1500 09/29/16 1741 09/30/16 0322  LATICACIDVEN 0.9 2.8* 2.6*    ABG  Recent Labs Lab 09/29/16 1730 09/29/16 2245 09/30/16 0345  PHART 7.375 7.396 7.340*  PCO2ART 37.8 38.3 45.2  PO2ART 99.1 100.0 99.0    Liver Enzymes  Recent Labs Lab 09/29/16 1500  AST 16  ALT 18  ALKPHOS 61  BILITOT 1.9*  ALBUMIN 2.4*    Cardiac Enzymes  Recent Labs Lab 09/29/16 1726  TROPONINI 0.07*    Glucose  Recent  Labs Lab 09/24/16 0748 09/30/16 0046 09/30/16 0344 09/30/16 0633 09/30/16 0731 09/30/16 0831  GLUCAP 115* 357* 266* 215* 251* 234*    Imaging Dg Chest Portable 1 View  Result Date: 09/29/2016 CLINICAL DATA:  NG tube verification EXAM: PORTABLE CHEST 1 VIEW COMPARISON:  09/29/2016 FINDINGS: Endotracheal tube 2.7 cm above the carina. Nasogastric tube with the tip projecting over the stomach, but the proximal port is above the esophagogastric junction. Recommend advancing the tube 10 cm. Bilateral small pleural effusions. Bilateral interstitial thickening. Right mid and lower lung airspace disease. Mild left lower lobe airspace disease. No  pneumothorax. No acute osseous abnormality. IMPRESSION: 1. Endotracheal tube 2.7 cm above the carina. 2. Nasogastric tube with the tip projecting over the stomach, but the proximal port is above the esophagogastric junction. Recommend advancing the tube 10 cm. 3. Findings concerning for pulmonary edema versus multi lobar pneumonia. Electronically Signed   By: Kathreen Devoid   On: 09/29/2016 17:42   Dg Chest Port 1 View  Result Date: 09/29/2016 CLINICAL DATA:  Intubation. EXAM: PORTABLE CHEST 1 VIEW COMPARISON:  05/23/2015 . FINDINGS: Endotracheal tube tip noted at the carina. Proximal repositioning of 3 cm should be considered. NG tube noted with tip at the level of the lower esophagus/ upper stomach. Distal positioning of approximately 10 cm should be considered. Diffuse bilateral pulmonary infiltrates, right side greater than left. Bilateral pleural effusions. Cardiomegaly. Prior vertebroplasties. IMPRESSION: 1. Endotracheal tube tip noted at the carina. Proximal repositioning of approximately 3 cm should be considered. NG tube noted with tip at the level of the lower esophagus/upper stomach, distal position approximately 10 cm should be considered. 2. Diffuse bilateral pulmonary infiltrates right side greater than left. Bilateral pleural effusions. 3. Cardiomegaly. Critical Value/emergent results were called by telephone at the time of interpretation on 09/29/2016 at 5:01 pm to Dr. Merrily Pew , who verbally acknowledged these results. Electronically Signed   By: Marcello Moores  Register   On: 09/29/2016 17:05   Dg Abd Portable 1v  Result Date: 09/30/2016 CLINICAL DATA:  83 year old male with orogastric tube placement. Subsequent encounter. EXAM: PORTABLE ABDOMEN - 1 VIEW COMPARISON:  09/30/2016. FINDINGS: Nasogastric tube has been advanced. Tip and side hole in the region of the left upper quadrant probably within the gastric fundus. Pigtail catheter right upper quadrant. Right femoral line tip just the right of  the lower L5 vertebra. Cement augmentation T11, T12 and L1. Consolidation right lung. Nonspecific bowel gas pattern. IMPRESSION: Nasogastric tube tip and side hole left upper quadrant probably within the region of the gastric fundus. Electronically Signed   By: Genia Del M.D.   On: 09/30/2016 06:52   Dg Abd Portable 1v  Result Date: 09/30/2016 CLINICAL DATA:  Orogastric tube repositioning.  Initial encounter. EXAM: PORTABLE ABDOMEN - 1 VIEW COMPARISON:  Chest radiograph performed 09/29/2016, and CT of the abdomen and pelvis from 09/17/2016 FINDINGS: The patient's enteric tube is noted ending overlying the distal esophagus, on correlation with prior CT. The visualized bowel gas pattern is grossly unremarkable. A cholecystostomy tube is noted. No free intra-abdominal air is identified, though evaluation for free air is limited on a single supine view. No acute osseous abnormalities are seen. The patient is status post vertebroplasty at multiple levels along the lower thoracic spine. IMPRESSION: Enteric tube noted ending overlying the distal esophagus, on correlation with prior CT. This could be advanced at least 8 cm, as deemed clinically appropriate. Electronically Signed   By: Garald Balding M.D.   On: 09/30/2016  01:14     STUDIES:    CULTURES: BCx2 3/26 > Tracheal aspirate 3/27 > Body fluid culture 3/27: rare GPC pairs>>>  ANTIBIOTICS: Ceftaz 3/27 > Vancomycin 3/27 >  SIGNIFICANT EVENTS:   LINES/TUBES: ETT 3/26 > CVL 3/26 >  DISCUSSION:   ASSESSMENT / PLAN:  Acute Hypoxic respiratory failure in setting of HCAP +/- R>L effusions Plan Cont full vent support Bedside US, thora (diagnostic/therapeutic if sig fluid). If clear loculations will need CT chest first Cont VAP bundle Cont abx  Septic shock d/t HCAP AND Acute Cholecystitis  -SV w/ 35% change during PLR assessment.  Plan Fluid challenge and repeat PLR Cont levo for MAP > 65 Holding home antihypertensives Ck  cortisol  Cont Zosyn and vanc started 3/26  At risk for AKI Hypokalemia & hypophosphatemia  Plan Cont IVFs (changing to LR) Replace K Trend lytes  Recent cholecystitis  Plan Cont perc drainage Cont abx  Protonix   Chronic anemia w/out s/sx bleeding Plan Cont cbc [place] heparin  Transfuse per protocol   DM w/ hyperglycemia  Plan Insulin gtt  Acute encephalopathy  -some ? Of dementia. Need to verify functional and cognitive baseline Plan PAD protocol  DVT prophylaxis: [place] heparin SUP: ppi Diet: start tubefeeeds Activity: bedrest Disposition : ICU   Discussion Septic shock d/t HCAP and cholecystitis (fluid w/ GPC). Still volume responsive.  -cont IVFs until SV <10% w/ PLR, cont broad spec abx, wean pressors. [place] right chest look for large effusion. If clearly has fibrinous changes will CT chest AND also consider CT abd.   My critical care time 65 minutes.   Erick Colace ACNP-BC [place] Pager # [phone] OR # [phone] if no answer    09/30/2016 9:30 AM

## 2016-09-30 NOTE — Progress Notes (Signed)
Initial Nutrition Assessment  DOCUMENTATION CODES:   Not applicable  INTERVENTION:    Vital AF 1.2 at 50 ml/h (1200 ml per day)   Provides 1440 kcal, 90 gm protein, 973 ml free water daily  NUTRITION DIAGNOSIS:   Inadequate oral intake related to inability to eat as evidenced by NPO status.  GOAL:   Patient will meet greater than or equal to 90% of their needs  MONITOR:   Vent status, TF tolerance, Labs, Skin, I & O's  REASON FOR ASSESSMENT:   Consult Enteral/tube feeding initiation and management  ASSESSMENT:   83 year old male with PMH of DM, bedbound, dementia, prostate Ca, and CVA. He presented to [facility] ED after being found unresponsive at home. He remained unresponsive in ED and was intubated for airway protection. CXR concerning for multifocal PNA. [facility] was [facility].   Received MD Consult for TF initiation and management. Unable to complete Nutrition-Focused physical exam at this time. Procedure ongoing in room.  Patient is currently intubated on ventilator support MV: 6.3 L/min Temp (24hrs), Avg:98.9 F (37.2 C), Min:97.7 F (36.5 C), Max:99.3 F (37.4 C)  Propofol: None Labs reviewed: sodium 133 (L) CBG's: [id number] Medications reviewed and include IV potassium phosphate.  Diet Order:  Diet NPO time specified  Skin:  Wound (see comment) (stage I and II to sacrum)  Last BM:  PTA  Height:   Ht Readings from Last 1 Encounters:  09/30/16 5\' 4"  (1.626 m)    Weight:   Wt Readings from Last 1 Encounters:  09/30/16 134 lb 14.7 oz (61.2 kg)    Ideal Body Weight:  59.1 kg  BMI:  Body mass index is 23.16 kg/m.  Estimated Nutritional Needs:   Kcal:  1400  Protein:  80-90 gm  Fluid:  1.6-1.8 L  EDUCATION NEEDS:   No education needs identified at this time  Molli Barrows, [place], [place], [place] Pager [phone] After Hours Pager [phone]

## 2016-09-30 NOTE — Progress Notes (Signed)
[facility] Progress Note Patient Name: Stephen Bates DOB: 1926-07-26 MRN: [id number]   Date of Service  09/30/2016  HPI/Events of Note  Patient transitioned off insulin infusion  eICU Interventions  Start SSI     Intervention Category Evaluation Type: Other  Tavaria Mackins 09/30/2016, 8:08 PM

## 2016-09-30 NOTE — Progress Notes (Signed)
Pt became agitated and tachypnea, more frequent PVC's.  Pt placed back on full vent support.

## 2016-09-30 NOTE — Progress Notes (Signed)
Pharmacy Antibiotic Note  Stephen Bates is a 83 y.o. male admitted on 09/29/2016 from [facility] ED with HCAP. (Noted pt on Zosyn from 3/14-3/20 at [facility] for acute cholecystitis - d/c 3/21). Pharmacy has been consulted for Ceftazidime and Vancomycin dosing.  Pt also received Zosyn 3.375gm and Vanc 1gm ~1600 at [facility] ED  Plan: Ceftazidime 1gm IV q12h Vancomycin 500mg  IV q12h Will f/u micro data, renal function, and pt's clinical condition Vanc trough prn    Temp (24hrs), Avg:98.7 F (37.1 C), Min:97.7 F (36.5 C), Max:99.1 F (37.3 C)   Recent Labs Lab 09/24/16 0554 09/29/16 1500 09/29/16 1741  WBC 14.6* 10.4  --   CREATININE 0.51* 0.61  --   LATICACIDVEN  --  0.9 2.8*    Estimated Creatinine Clearance: 54.5 mL/min (by C-G formula based on SCr of 0.61 mg/dL).    No Known Allergies  Antimicrobials this admission: 3/26 Zosyn x 1 3/26 Vanc >>  3/27 Stephen Bates >>  Dose adjustments this admission: n/a  Microbiology results: 3/26 BCx x2:  3/26 Trach asp:     Thank you for allowing pharmacy to be a part of this patient's care.  Stephen Bates, PharmD, BCPS Clinical pharmacist, pager [phone] 09/30/2016 1:05 AM

## 2016-09-30 NOTE — Progress Notes (Signed)
[place] Physician Progress Note and Electrolyte Replacement  Patient Name: Stephen Bates DOB: [birth year] MRN: [id number]  Date of Service  09/30/2016   HPI/Events of Note    Recent Labs Lab 09/24/16 0554 09/29/16 1500 09/30/16 0321  NA 136 139 135  K 3.3* 4.0 2.9*  CL 101 102 103  CO2 30 25 26   GLUCOSE 152* 229* 311*  BUN 13 13 7   CREATININE 0.51* 0.61 0.65  CALCIUM 8.6* 8.7* 7.7*  MG 1.8  --  1.7  PHOS  --   --  1.1*    Estimated Creatinine Clearance: 54.5 mL/min (by C-G formula based on SCr of 0.65 mg/dL).  Intake/Output      03/26 0701 - 03/27 0700   I.V. (mL/kg) 972.3 (15.9)   IV Piggyback 50   Total Intake(mL/kg) 1022.3 (16.7)   Urine (mL/kg/hr) 880   Other 100   Total Output 980   Net +42.3        - I/O DETAILED x 24h    Total I/O In: 1022.3 [I.V.:972.3; IV Piggyback:50] Out: 605 [Urine:505; Other:100] - I/O THIS SHIFT    ASSESSMENT hyperglycemiua Hypokalemia Hypomagnesemia Hypophosphatemia   eICURN Interventions  Reduce d5 rate Replete all 3   ASSESSMENT: MAJOR ELECTROLYTE      Dr. Brand Males, M.D., [facility].C.P Pulmonary and Critical Care Medicine Staff Physician [place] [facility] Pager: [phone], If no answer or between  15:00h - 7:00h: call [phone]  09/30/2016 5:47 AM

## 2016-10-01 ENCOUNTER — Inpatient Hospital Stay (HOSPITAL_COMMUNITY): Payer: Medicare Other

## 2016-10-01 DIAGNOSIS — G9340 Encephalopathy, unspecified: Secondary | ICD-10-CM

## 2016-10-01 DIAGNOSIS — J90 Pleural effusion, not elsewhere classified: Secondary | ICD-10-CM

## 2016-10-01 DIAGNOSIS — J189 Pneumonia, unspecified organism: Secondary | ICD-10-CM

## 2016-10-01 LAB — MAGNESIUM
MAGNESIUM: 1.9 mg/dL (ref 1.7–2.4)
Magnesium: 1.9 mg/dL (ref 1.7–2.4)

## 2016-10-01 LAB — COMPREHENSIVE METABOLIC PANEL
ALBUMIN: 1.7 g/dL — AB (ref 3.5–5.0)
ALK PHOS: 55 U/L (ref 38–126)
ALT: 14 U/L — AB (ref 17–63)
AST: 19 U/L (ref 15–41)
Anion gap: 5 (ref 5–15)
BILIRUBIN TOTAL: 0.5 mg/dL (ref 0.3–1.2)
BUN: 5 mg/dL — ABNORMAL LOW (ref 6–20)
CALCIUM: 7.9 mg/dL — AB (ref 8.9–10.3)
CO2: 27 mmol/L (ref 22–32)
CREATININE: 0.48 mg/dL — AB (ref 0.61–1.24)
Chloride: 102 mmol/L (ref 101–111)
GFR calc Af Amer: >60 mL/min (ref >60)
GFR calc non Af Amer: >60 mL/min (ref >60)
GLUCOSE: 205 mg/dL — AB (ref 65–99)
Potassium: 4 mmol/L (ref 3.5–5.1)
SODIUM: 134 mmol/L — AB (ref 135–145)
TOTAL PROTEIN: 5.2 g/dL — AB (ref 6.5–8.1)

## 2016-10-01 LAB — CBC
HCT: 33.5 % — ABNORMAL LOW (ref 39.0–52.0)
Hemoglobin: 10.3 g/dL — ABNORMAL LOW (ref 13.0–17.0)
MCH: 32 pg (ref 26.0–34.0)
MCHC: 30.7 g/dL (ref 30.0–36.0)
MCV: 104 fL — ABNORMAL HIGH (ref 78.0–100.0)
PLATELETS: 508 10*3/uL — AB (ref 150–400)
RBC: 3.22 MIL/uL — ABNORMAL LOW (ref 4.22–5.81)
RDW: 13.4 % (ref 11.5–15.5)
WBC: 11.4 10*3/uL — ABNORMAL HIGH (ref 4.0–10.5)

## 2016-10-01 LAB — GLUCOSE, CAPILLARY
GLUCOSE-CAPILLARY: 313 mg/dL — AB (ref 65–99)
GLUCOSE-CAPILLARY: 310 mg/dL — AB (ref 65–99)
GLUCOSE-CAPILLARY: 216 mg/dL — AB (ref 65–99)
Glucose-Capillary: 161 mg/dL — ABNORMAL HIGH (ref 65–99)
Glucose-Capillary: 196 mg/dL — ABNORMAL HIGH (ref 65–99)

## 2016-10-01 LAB — POCT I-STAT 3, ART BLOOD GAS (G3+)
ACID-BASE EXCESS: 1 mmol/L (ref 0.0–2.0)
Bicarbonate: 25.7 mmol/L (ref 20.0–28.0)
O2 Saturation: 97 %
PH ART: 7.406 (ref 7.350–7.450)
TCO2: 27 mmol/L (ref 0–100)
pCO2 arterial: 41 mmHg (ref 32.0–48.0)
pO2, Arterial: 91 mmHg (ref 83.0–108.0)

## 2016-10-01 LAB — PHOSPHORUS
Phosphorus: 2.2 mg/dL — ABNORMAL LOW (ref 2.5–4.6)
Phosphorus: 1.8 mg/dL — ABNORMAL LOW (ref 2.5–4.6)

## 2016-10-01 MED ORDER — INSULIN ASPART 100 UNIT/ML ~~LOC~~ SOLN
0.0000 [IU] | SUBCUTANEOUS | Status: DC
  Administered 2016-10-01: 7 [IU] via SUBCUTANEOUS
  Administered 2016-10-01: 15 [IU] via SUBCUTANEOUS
  Administered 2016-10-03 (×2): 3 [IU] via SUBCUTANEOUS

## 2016-10-01 MED ORDER — DEXTROSE 50 % IV SOLN
INTRAVENOUS | Status: AC
  Administered 2016-10-01: 50 mL
  Filled 2016-10-01: qty 50

## 2016-10-01 MED ORDER — FAMOTIDINE 40 MG/5ML PO SUSR
20.0000 mg | Freq: Two times a day (BID) | ORAL | Status: DC
  Administered 2016-10-01: 20 mg
  Filled 2016-10-01 (×5): qty 2.5

## 2016-10-01 MED ORDER — POTASSIUM CHLORIDE 20 MEQ/15ML (10%) PO SOLN
40.0000 meq | Freq: Once | ORAL | Status: AC
  Administered 2016-10-01: 40 meq
  Filled 2016-10-01: qty 30

## 2016-10-01 MED ORDER — INSULIN GLARGINE 100 UNIT/ML ~~LOC~~ SOLN
20.0000 [IU] | SUBCUTANEOUS | Status: DC
  Administered 2016-10-01: 20 [IU] via SUBCUTANEOUS
  Filled 2016-10-01 (×2): qty 0.2

## 2016-10-01 MED ORDER — SODIUM CHLORIDE 0.9% FLUSH
10.0000 mL | Freq: Two times a day (BID) | INTRAVENOUS | Status: DC
  Administered 2016-10-03 – 2016-10-12 (×7): 10 mL

## 2016-10-01 MED ORDER — INSULIN ASPART 100 UNIT/ML ~~LOC~~ SOLN
5.0000 [IU] | SUBCUTANEOUS | Status: DC
  Administered 2016-10-01 (×2): 5 [IU] via SUBCUTANEOUS

## 2016-10-01 MED ORDER — FUROSEMIDE 10 MG/ML IJ SOLN
40.0000 mg | Freq: Once | INTRAMUSCULAR | Status: AC
  Administered 2016-10-01: 40 mg via INTRAVENOUS
  Filled 2016-10-01: qty 4

## 2016-10-01 MED ORDER — MIDAZOLAM HCL 2 MG/2ML IJ SOLN
2.0000 mg | Freq: Once | INTRAMUSCULAR | Status: AC
  Administered 2016-10-02: 2 mg via INTRAVENOUS
  Filled 2016-10-01: qty 2

## 2016-10-01 MED ORDER — ACETAMINOPHEN 325 MG PO TABS
650.0000 mg | ORAL_TABLET | Freq: Four times a day (QID) | ORAL | Status: DC | PRN
  Filled 2016-10-01: qty 2

## 2016-10-01 MED ORDER — NOREPINEPHRINE 4 MG/250ML-% IV SOLN
0.0000 ug/min | INTRAVENOUS | Status: DC
  Administered 2016-10-02: 2 ug/min via INTRAVENOUS
  Filled 2016-10-01 (×2): qty 250

## 2016-10-01 MED ORDER — CHLORHEXIDINE GLUCONATE CLOTH 2 % EX PADS
6.0000 | MEDICATED_PAD | Freq: Every day | CUTANEOUS | Status: DC
  Administered 2016-10-02 – 2016-10-14 (×13): 6 via TOPICAL

## 2016-10-01 MED ORDER — SODIUM CHLORIDE 0.9% FLUSH
10.0000 mL | INTRAVENOUS | Status: DC | PRN
  Administered 2016-10-04 – 2016-10-08 (×3): 10 mL
  Filled 2016-10-01 (×3): qty 40

## 2016-10-01 MED ORDER — BISACODYL 10 MG RE SUPP
10.0000 mg | Freq: Once | RECTAL | Status: AC
  Administered 2016-10-01: 10 mg via RECTAL
  Filled 2016-10-01: qty 1

## 2016-10-01 NOTE — Progress Notes (Signed)
RT transported patient to CT and back without any complications. 

## 2016-10-01 NOTE — Progress Notes (Signed)
PULMONARY / CRITICAL CARE MEDICINE   Name: Stephen Bates MRN: [id number] DOB: 04/07/1927    ADMISSION DATE:  09/29/2016 CONSULTATION DATE:  09/29/2016  REFERRING MD:  Dr. Shanon Brow: [facility] Hospitalist  CHIEF COMPLAINT:  SOB  HISTORY OF PRESENT ILLNESS:   83 year old male with PMH as below, which is significant for DM, bedbound, dementia, prostate Ca, and CVA. He was also recently admitted 3/14 > 3/21 to [facility] for cholecystitis treated with IV antibiotics (Zosyn) and cystostomy placement. Hospital course complicated by PAF, which resolved spontaneously. He was discharged to home with home health nursing. 3/26 he presented to ED after being found unresponsive at home. He remained unresponsive in ED and was intubated for airway protection. He was also started on norepinephrine for shock. CXR concerning for multifocal PNA. He was transferred to [facility] for ICU care.    SUBJECTIVE:  Off pressors and sedating meds   VITAL SIGNS: BP (!) 159/76   Pulse (!) 55   Temp 99.7 F (37.6 C)   Resp 14   Ht 5\' 4"  (1.626 m)   Wt 151 lb 7.3 oz (68.7 kg)   SpO2 95%   BMI 26.00 kg/m   HEMODYNAMICS:    VENTILATOR SETTINGS: Vent Mode: PRVC FiO2 (%):  [40 %] 40 % Set Rate:  [14 bmp] 14 bmp Vt Set:  [480 mL] 480 mL PEEP:  [5 cmH20] 5 cmH20 Pressure Support:  [5 cmH20] 5 cmH20 Plateau Pressure:  [19 cmH20-21 cmH20] 21 cmH20  INTAKE / OUTPUT: I/O last 3 completed shifts: In: 5495.7 [I.V.:3645.7; NG/GT:840; IV Piggyback:1010] Out: 6222 [LNLGX:2119; Drains:250; Other:100]   General appearance:  Frail 83 Year old  Male, sedated on vent but getting more awake off gtts Eyes: anicteric sclera, moist conjunctivae; PERRL, EOMI bilaterally. Mouth:  membranes and no mucosal ulcerations; normal hard and soft palate, orally intubated  Neck: Trachea midline; neck supple, no JVD Lungs/chest: occ rhonchi. Decreased R>L,  with normal respiratory effort and no intercostal retractions CV: RRR, no  MRGs  Abdomen: Soft, distended no masses or HSM Extremities: + peripheral edema Skin: Normal temperature, turgor and texture; no rash, ulcers or subcutaneous nodules Neuro/Psych: agitated at times. Moves all ext but weak. Can't get him to f/c LABS:  BMET  Recent Labs Lab 09/30/16 0321 09/30/16 1054 09/30/16 1713 10/01/16 0444  NA 135 133*  --  134*  K 2.9* 4.0  --  4.0  CL 103 103  --  102  CO2 26 23  --  27  BUN 7 6  --  5*  CREATININE 0.65 0.52* 0.51* 0.48*  GLUCOSE 311* 152*  --  205*    Electrolytes  Recent Labs Lab 09/30/16 0321 09/30/16 1054 09/30/16 1713 10/01/16 0444  CALCIUM 7.7* 7.8*  --  7.9*  MG 1.7 2.1 2.2 1.9  PHOS 1.1* 2.8 2.2* 2.2*    CBC  Recent Labs Lab 09/30/16 0321 09/30/16 1713 10/01/16 0444  WBC 12.3* 13.5* 11.4*  HGB 10.0* 10.6* 10.3*  HCT 31.0* 33.9* 33.5*  PLT 476* 561* 508*    Coag's No results for input(s): APTT, INR in the last 168 hours.  Sepsis Markers  Recent Labs Lab 09/29/16 1500 09/29/16 1741 09/30/16 0322  LATICACIDVEN 0.9 2.8* 2.6*    ABG  Recent Labs Lab 09/29/16 2245 09/30/16 0345 10/01/16 0331  PHART 7.396 7.340* 7.406  PCO2ART 38.3 45.2 41.0  PO2ART 100.0 99.0 91.0    Liver Enzymes  Recent Labs Lab 09/29/16 1500 10/01/16 0444  AST 16 19  ALT 18 14*  ALKPHOS 61 55  BILITOT 1.9* 0.5  ALBUMIN 2.4* 1.7*    Cardiac Enzymes  Recent Labs Lab 09/29/16 1726  TROPONINI 0.07*    Glucose  Recent Labs Lab 09/30/16 1808 09/30/16 1911 09/30/16 2015 09/30/16 2326 10/01/16 0337 10/01/16 0832  GLUCAP 156* 154* 199* 156* 161* 196*    Imaging Dg Chest Port 1 View  Result Date: 10/01/2016 CLINICAL DATA:  Respiratory failure. EXAM: PORTABLE CHEST 1 VIEW COMPARISON:  09/29/2016. FINDINGS: Interim advancement of NG tube, its tip is below left hemidiaphragm. Endotracheal tube in stable position Heart size stable. Persistent bilateral pulmonary infiltrates and bilateral pleural effusions. Low  lung volumes. No interim change . No pneumothorax. Prior vertebroplasties. IMPRESSION: 1. Interim advancement of NG tube, its tip is below left hemidiaphragm. Endotracheal tube in stable position. 2. Persistent bilateral pulmonary infiltrates and bilateral pleural effusions, right side greater than left. Low lung volumes. No interim change. Electronically Signed   By: Marcello Moores  Register   On: 10/01/2016 06:55  bilateral infiltrates. Right>left. A little better aeration when c/w prior film    STUDIES:    CULTURES: BCx2 3/26 > Tracheal aspirate 3/27 mod wbc, no orgs>>> Body fluid culture 3/27: rare GPC pairs>>>  ANTIBIOTICS: Ceftaz 3/27 > Vancomycin 3/27 >  SIGNIFICANT EVENTS: 3/27 admitted w/ septic shock d/t HCAP and on-going cholecystitis. Got additional volume + 3.3 liters. Weaned pressors to off. Looked at right chest for thora. Agitation and body habitus made window for safe sampling small. Decided to hold off.  3/28 wean sedating gtts to off. Weaning on PSV.   LINES/TUBES: ETT 3/26 > CVL 3/26 >  DISCUSSION:   ASSESSMENT / PLAN:  Acute Hypoxic respiratory failure in setting of HCAP +/- R>L effusions -aeration a little better -has small to moderate right effusion. Ideally should be sampled but body habitus and agitation make this risky  Plan Full vent support; but will initiate PSV as tolerated Consider repeat US vs Non-contrast CT chest after extubation Cont BDs Cont abx (as below) Lasix x 1 w/ KCL  Septic shock d/t HCAP AND Acute Cholecystitis  -sputum no orgs to date - wbc going the right direction -GPC growing from perc drain -as of 3/28 shock seems more r/t sedation than sepsis. He's 3.3 liters positive.  Plan Wean sedation to off.  Wean levo for MAP > 65 Holding antihypertensives KVO IVFs f/u culture data vanc 3/26>>> fortaz 3/26>>> Will hold off on solucortef replacement as coming of pressors.   PAF. ? r/t levo? New and in and out of NSR Plan Cont  tele Stop levo At 7 & bedbound not a AC candidate   Recent cholecystitis s/p perc drain placed 3/19 Plan Keep Perc drain  f/u culture data Cont abx   Chronic anemia w/out s/sx bleeding Plan Trend CBC Transfuse per protocol    DM w/ hyperglycemia  Plan Hyperglycemia protocol   Acute encephalopathy   w/ h/o mild dementia Plan Dc fent gtt Dc precedex gtt PRN fent only  Abd distention Plan Dulcolax x 1  DVT prophylaxis: heparin  SUP: H2B Diet: tubefeeds Activity: bedrest Disposition : icu.    Discussion Looking better. WBC count is decreasing. CXR a little better. Now off sedating gtts.  Having brief runs of AF but given his dementia, age and bedbound state he is not a AC candidate  For today: Lasix x1 Wean as tolerated] Give LOC Stop cont sedation Re-assess for extubation this afternoon.   My critical  care time 35 minutes  Erick Colace ACNP-BC [place] Pager # [phone] OR # [phone] if no answer    10/01/2016 9:42 AM

## 2016-10-01 NOTE — Progress Notes (Signed)
Wasted 120 mL Fentanyl with Rosine Door, RN

## 2016-10-01 NOTE — Progress Notes (Signed)
Peripherally Inserted Central Catheter/Midline Placement  The IV Nurse has discussed with the patient and/or persons authorized to consent for the patient, the purpose of this procedure and the potential benefits and risks involved with this procedure.  The benefits include less needle sticks, lab draws from the catheter, and the patient may be discharged home with the catheter. Risks include, but not limited to, infection, bleeding, blood clot (thrombus formation), and puncture of an artery; nerve damage and irregular heartbeat and possibility to perform a PICC exchange if needed/ordered by physician.  Alternatives to this procedure were also discussed.  Bard Power PICC patient education guide, fact sheet on infection prevention and patient information card has been provided to patient /or left at bedside.    PICC/Midline Placement Documentation        Stephen Bates 10/01/2016, 6:50 PM

## 2016-10-02 ENCOUNTER — Inpatient Hospital Stay (HOSPITAL_COMMUNITY): Payer: Medicare Other

## 2016-10-02 DIAGNOSIS — J90 Pleural effusion, not elsewhere classified: Secondary | ICD-10-CM

## 2016-10-02 LAB — GLUCOSE, CAPILLARY
GLUCOSE-CAPILLARY: 107 mg/dL — AB (ref 65–99)
GLUCOSE-CAPILLARY: 107 mg/dL — AB (ref 65–99)
GLUCOSE-CAPILLARY: 132 mg/dL — AB (ref 65–99)
GLUCOSE-CAPILLARY: 189 mg/dL — AB (ref 65–99)
GLUCOSE-CAPILLARY: 45 mg/dL — AB (ref 65–99)
GLUCOSE-CAPILLARY: 45 mg/dL — AB (ref 65–99)
GLUCOSE-CAPILLARY: 50 mg/dL — AB (ref 65–99)
GLUCOSE-CAPILLARY: 87 mg/dL (ref 65–99)
Glucose-Capillary: 118 mg/dL — ABNORMAL HIGH (ref 65–99)
Glucose-Capillary: 129 mg/dL — ABNORMAL HIGH (ref 65–99)
Glucose-Capillary: 55 mg/dL — ABNORMAL LOW (ref 65–99)
Glucose-Capillary: 84 mg/dL (ref 65–99)
Glucose-Capillary: 97 mg/dL (ref 65–99)

## 2016-10-02 LAB — COMPREHENSIVE METABOLIC PANEL
ALK PHOS: 57 U/L (ref 38–126)
ALT: 13 U/L — AB (ref 17–63)
AST: 21 U/L (ref 15–41)
Albumin: 1.7 g/dL — ABNORMAL LOW (ref 3.5–5.0)
Anion gap: 5 (ref 5–15)
BILIRUBIN TOTAL: 0.5 mg/dL (ref 0.3–1.2)
BUN: 5 mg/dL — AB (ref 6–20)
CALCIUM: 7.8 mg/dL — AB (ref 8.9–10.3)
CHLORIDE: 97 mmol/L — AB (ref 101–111)
CO2: 34 mmol/L — ABNORMAL HIGH (ref 22–32)
CREATININE: 0.36 mg/dL — AB (ref 0.61–1.24)
Glucose, Bld: 51 mg/dL — ABNORMAL LOW (ref 65–99)
Potassium: 3 mmol/L — ABNORMAL LOW (ref 3.5–5.1)
Sodium: 136 mmol/L (ref 135–145)
TOTAL PROTEIN: 5.2 g/dL — AB (ref 6.5–8.1)

## 2016-10-02 LAB — LACTATE DEHYDROGENASE, PLEURAL OR PERITONEAL FLUID: LD FL: 67 U/L — AB (ref 3–23)

## 2016-10-02 LAB — BODY FLUID CELL COUNT WITH DIFFERENTIAL
Lymphs, Fluid: 52 %
MONOCYTE-MACROPHAGE-SEROUS FLUID: 10 % — AB (ref 50–90)
Neutrophil Count, Fluid: 38 % — ABNORMAL HIGH (ref 0–25)
WBC FLUID: 100 uL (ref 0–1000)

## 2016-10-02 LAB — CBC
HEMATOCRIT: 32 % — AB (ref 39.0–52.0)
Hemoglobin: 10.3 g/dL — ABNORMAL LOW (ref 13.0–17.0)
MCH: 32.7 pg (ref 26.0–34.0)
MCHC: 32.2 g/dL (ref 30.0–36.0)
MCV: 101.6 fL — AB (ref 78.0–100.0)
PLATELETS: 461 10*3/uL — AB (ref 150–400)
RBC: 3.15 MIL/uL — AB (ref 4.22–5.81)
RDW: 13.2 % (ref 11.5–15.5)
WBC: 13.7 10*3/uL — AB (ref 4.0–10.5)

## 2016-10-02 LAB — CULTURE, RESPIRATORY W GRAM STAIN: Special Requests: NORMAL

## 2016-10-02 LAB — VANCOMYCIN, TROUGH: Vancomycin Tr: 8 ug/mL — ABNORMAL LOW (ref 15–20)

## 2016-10-02 LAB — PROTEIN, PLEURAL OR PERITONEAL FLUID

## 2016-10-02 LAB — CULTURE, RESPIRATORY: CULTURE: NORMAL

## 2016-10-02 LAB — LACTIC ACID, PLASMA: LACTIC ACID, VENOUS: 2.3 mmol/L — AB (ref 0.5–1.9)

## 2016-10-02 MED ORDER — DEXTROSE 50 % IV SOLN
INTRAVENOUS | Status: AC
  Administered 2016-10-02: 50 mL
  Filled 2016-10-02: qty 50

## 2016-10-02 MED ORDER — VANCOMYCIN HCL IN DEXTROSE 1-5 GM/200ML-% IV SOLN
1000.0000 mg | Freq: Two times a day (BID) | INTRAVENOUS | Status: DC
  Administered 2016-10-03: 1000 mg via INTRAVENOUS
  Filled 2016-10-02: qty 200

## 2016-10-02 MED ORDER — VANCOMYCIN HCL 500 MG IV SOLR
500.0000 mg | Freq: Once | INTRAVENOUS | Status: AC
  Administered 2016-10-02: 500 mg via INTRAVENOUS
  Filled 2016-10-02 (×2): qty 500

## 2016-10-02 MED ORDER — POTASSIUM CHLORIDE 2 MEQ/ML IV SOLN
30.0000 meq | Freq: Once | INTRAVENOUS | Status: AC
  Administered 2016-10-02: 30 meq via INTRAVENOUS
  Filled 2016-10-02: qty 15

## 2016-10-02 MED ORDER — CEFTAZIDIME 2 G IJ SOLR
2.0000 g | Freq: Three times a day (TID) | INTRAMUSCULAR | Status: DC
  Administered 2016-10-02 – 2016-10-09 (×21): 2 g via INTRAVENOUS
  Filled 2016-10-02 (×23): qty 2

## 2016-10-02 MED ORDER — DEXTROSE IN LACTATED RINGERS 5 % IV SOLN
INTRAVENOUS | Status: DC
  Administered 2016-10-02: 03:00:00 via INTRAVENOUS

## 2016-10-02 MED ORDER — FUROSEMIDE 10 MG/ML IJ SOLN
40.0000 mg | Freq: Once | INTRAMUSCULAR | Status: AC
  Administered 2016-10-02: 40 mg via INTRAVENOUS
  Filled 2016-10-02: qty 4

## 2016-10-02 MED ORDER — DEXTROSE 10 % IV SOLN
INTRAVENOUS | Status: DC
  Administered 2016-10-02 – 2016-10-06 (×4): via INTRAVENOUS

## 2016-10-02 NOTE — Procedures (Signed)
Extubation Procedure Note  Patient Details:   Name: Stephen Bates DOB: 08/22/1926 MRN: [id number]   Airway Documentation:     Evaluation  O2 sats: stable throughout Complications: No apparent complications Patient did tolerate procedure well. Bilateral Breath Sounds: Clear, Diminished   No   Positive cuff leak noted. Pt placed on [place] 4L with humidity, not speaking but nods head.  Pt not able to use IS at this time.   Bayard Beaver 10/02/2016, 4:44 PM

## 2016-10-02 NOTE — Progress Notes (Signed)
Nutrition Follow-up  DOCUMENTATION CODES:   Non-severe (moderate) malnutrition in context of chronic illness  INTERVENTION:  -If unable to extubate, plan to replace OG/NG tube; recommend resuming current TF regimen (vital 1.2 at 50 ml/hr) -If extubated, once diet advanced discussed nutritional supplementation with daughters. Plan to start [place] [facility]) on meal trays and Ensure supplement -Once diet advanced, recommend downgrading diet consistency to Dysphagia (II or II if pt can tolerate, or puree if needed) due to poor dentition and inability to chew most foods   NUTRITION DIAGNOSIS:   Malnutrition (Moderate) related to  (dementia, bed bound, poor dention/difficulty chewing) as evidenced by energy intake < or equal to 75% for > or equal to 1 month, mild depletion of body fat, moderate depletions of muscle mass.  GOAL:   Patient will meet greater than or equal to 90% of their needs  MONITOR:   Vent status, TF tolerance, Labs, Skin, I & O's  REASON FOR ASSESSMENT:   Consult Enteral/tube feeding initiation and management  ASSESSMENT:   83 year old male with PMH of DM, bedbound, dementia, prostate Ca, and CVA. He presented to [facility] ED after being found unresponsive at home. He remained unresponsive in ED and was intubated for airway protection. CXR concerning for multifocal PNA. He was transferred to [facility] for ICU care.   Pt remains on vent support, possible extubation today per Philipp Deputy. Pt alert, off sedation on visit today. Daughters at bedside; they report pt with very poor oral intake for the past year due to poor dentition. Pt has only been able to eat soft foods, mostly living on potato soup, sugar free ice cream and Atkins protein shakes. Pt has plans for dental extraction and dentures but has been unable to have this done thus far.  Pt has been bed bound for 3 years, lives at home with wife who provides the care for him Family unsure if pt has lost weight recently,  wt loss not evident per weight encounters  NG tube out over night, TF on hold. Per Izola Price RN, plan to replace OG/NG tube today if pt not extubated and resume TF, Hypoglycemic episodes after TF held, started on D10 infusion  Nutrition-Focused physical exam completed. Findings are mild/moderate fat depletion, mild/moderate to severe muscle depletion, and mild edema. Severe muscle depletion in LE likely due to bed bound status x 3 years. Pt does utilize upper extremeties and feeds himself etc.  Labs: potassium 3.0 (supplemented), CBGs 45-189 Meds:  D10 at 40 ml/hr (while TF on hold due to hypoglycemia)  Diet Order:  Diet NPO time specified  Skin:  Wound (see comment) (stage I and II to sacrum)  Last BM:  PTA  Height:   Ht Readings from Last 1 Encounters:  09/30/16 5\' 4"  (1.626 m)    Weight:   Wt Readings from Last 1 Encounters:  10/02/16 143 lb 4.8 oz (65 kg)    Ideal Body Weight:  59.1 kg  BMI:  Body mass index is 24.6 kg/m.  Estimated Nutritional Needs:   Kcal:  1400  Protein:  80-90 gm  Fluid:  1.6-1.8 L  EDUCATION NEEDS:   No education needs identified at this time  [place], [place], LDN [phone] Pager  [phone] Weekend/On-Call Pager

## 2016-10-02 NOTE — Procedures (Signed)
Thoracentesis Procedure Note  Pre-operative Diagnosis: pleural effusion, right  Post-operative Diagnosis: same  Indications: rt pleural effusion, vent dependent  Procedure Details  Consent: Informed consent was obtained. Risks of the procedure were discussed including: infection, bleeding, pain, pneumothorax.  Under sterile conditions the patient was positioned. Betadine solution and sterile drapes were utilized.  2% buffered lidocaine was used to anesthetize the rt 7th rib space. Fluid was obtained without any difficulties and minimal blood loss.  A dressing was applied to the wound and wound care instructions were provided.   Findings 1100 ml of clear pleural fluid was obtained. A sample was sent to Pathology for cytology and cell counts, as well as for infection analysis.  Complications:  None; patient tolerated the procedure well.          Condition: stable  Plan A follow up chest x-ray was ordered. Bed Rest for 2 hours. Tylenol 650 mg. for pain.  Attending Attestation: I performed the procedure.  Rigoberto Noel MD

## 2016-10-02 NOTE — Progress Notes (Signed)
Pharmacy Antibiotic Note  Stephen Bates is a 83 y.o. male admitted on 09/29/2016 from [facility] ED with HCAP. (Noted pt on Zosyn from 3/14-3/20 at [facility] for acute cholecystitis - d/c 3/21). Pharmacy has been consulted for Ceftazidime and Vancomycin dosing.  Patient's renal function has improved, WBC increasing and Tmax of 100.8.  Vancomycin trough low tonight at 8. Will adjust.  Plan: Continue Ceftazidime 2gm IV q8h Increase Vancomycin to 1000 mg IV q12h with extra 500mg  given now Will f/u micro data, renal function, and pt's clinical condition   Height: 5\' 4"  (162.6 cm) Weight: 143 lb 4.8 oz (65 kg) IBW/kg (Calculated) : 59.2  Temp (24hrs), Avg:99.9 F (37.7 C), Min:99 F (37.2 C), Max:101.5 F (38.6 C)   Recent Labs Lab 09/29/16 1500 09/29/16 1741 09/30/16 0321 09/30/16 0322 09/30/16 1054 09/30/16 1713 10/01/16 0444 10/01/16 2335 10/02/16 0300 10/02/16 1630  WBC 10.4  --  12.3*  --   --  13.5* 11.4*  --  13.7*  --   CREATININE 0.61  --  0.65  --  0.52* 0.51* 0.48*  --  0.36*  --   LATICACIDVEN 0.9 2.8*  --  2.6*  --   --   --  2.3*  --   --   VANCOTROUGH  --   --   --   --   --   --   --   --   --  8*    Estimated Creatinine Clearance: 54.5 mL/min (A) (by C-G formula based on SCr of 0.36 mg/dL (L)).    No Known Allergies  Erin Hearing PharmD., BCPS Clinical Pharmacist Pager [phone] 10/02/2016 6:32 PM

## 2016-10-02 NOTE — Progress Notes (Signed)
Pharmacy Antibiotic Note  Stephen Bates is a 83 y.o. male admitted on 09/29/2016 from [facility] ED with HCAP. (Noted pt on Zosyn from 3/14-3/20 at [facility] for acute cholecystitis - d/c 3/21). Pharmacy has been consulted for Ceftazidime and Vancomycin dosing.  Patient's renal function has improved, WBC increasing and Tmax of 100.8.  Plan: Ceftazidime 2gm IV q8h Vancomycin 500mg  IV q12h Will f/u micro data, renal function, and pt's clinical condition Vancomycin trough tonight  Height: 5\' 4"  (162.6 cm) Weight: 143 lb 4.8 oz (65 kg) IBW/kg (Calculated) : 59.2  Temp (24hrs), Avg:100.3 F (37.9 C), Min:99.1 F (37.3 C), Max:101.5 F (38.6 C)   Recent Labs Lab 09/29/16 1500 09/29/16 1741 09/30/16 0321 09/30/16 0322 09/30/16 1054 09/30/16 1713 10/01/16 0444 10/01/16 2335 10/02/16 0300  WBC 10.4  --  12.3*  --   --  13.5* 11.4*  --  13.7*  CREATININE 0.61  --  0.65  --  0.52* 0.51* 0.48*  --  0.36*  LATICACIDVEN 0.9 2.8*  --  2.6*  --   --   --  2.3*  --     Estimated Creatinine Clearance: 54.5 mL/min (A) (by C-G formula based on SCr of 0.36 mg/dL (L)).    No Known Allergies   Andrey Cota. Diona Foley, PharmD, BCPS Clinical Pharmacist [phone] 10/02/2016 12:07 PM

## 2016-10-02 NOTE — Care Management Note (Addendum)
Case Management Note  Patient Details  Name: Stephen Bates MRN: [id number] Date of Birth: 12-Feb-1927  Subjective/Objective:    Pt admitted with SOB                  Action/Plan:  PTA from home - recent discharge with [facility] with [facility].   Pt is on the ventilator.  Cm will continue to follow for discharge needs   Addendum 4/6 09:45 Stephen Collet RN CM patient being followed by Palliative Medicine. Currently home hospice being considered for DC, final plans not determined by family, deciding on tube feeding and continue to meet with palliative team. Family not at bedside at this time, CM left list of home hospice providers in room. CM will continue to follow.    Expected Discharge Date:                  Expected Discharge Plan:  [place]  In-House Referral:     Discharge planning Services  CM Consult  Post Acute Care Choice:    Choice offered to:     DME Arranged:    DME Agency:     HH Arranged:    HH Agency:     Status of Service:     If discussed at [employer], dates discussed:    Additional Comments:  Stephen Labrador, RN 10/02/2016, 3:08 PM

## 2016-10-02 NOTE — Progress Notes (Signed)
PULMONARY / CRITICAL CARE MEDICINE   Name: Stephen Bates MRN: [id number] DOB: March 06, 1927    ADMISSION DATE:  09/29/2016 CONSULTATION DATE:  09/29/2016  REFERRING MD:  Dr. Shanon Brow: [facility] Hospitalist  CHIEF COMPLAINT:  SOB  HISTORY OF PRESENT ILLNESS:   83 year old male with PMH as below, which is significant for DM, bedbound, dementia, prostate Ca, and CVA. He was also recently admitted 3/14 > 3/21 to [facility] for cholecystitis treated with IV antibiotics (Zosyn) and cystostomy placement. Hospital course complicated by PAF, which resolved spontaneously. He was discharged to home with home health nursing. 3/26 he presented to ED after being found unresponsive at home. He remained unresponsive in ED and was intubated for airway protection. He was also started on norepinephrine for shock. CXR concerning for multifocal PNA. He was transferred to [facility] for ICU care.    SUBJECTIVE:  Low gr febrile Off pressors , last dose versed at [place] Good UO with lasix NG came out overnight  VITAL SIGNS: BP 135/63   Pulse 91   Temp 99.1 F (37.3 C)   Resp 20   Ht 5\' 4"  (1.626 m)   Wt 143 lb 4.8 oz (65 kg)   SpO2 98%   BMI 24.60 kg/m   HEMODYNAMICS:    VENTILATOR SETTINGS: Vent Mode: PSV;CPAP FiO2 (%):  [40 %] 40 % Set Rate:  [14 bmp] 14 bmp Vt Set:  [480 mL] 480 mL PEEP:  [5 cmH20] 5 cmH20 Pressure Support:  [10 cmH20] 10 cmH20 Plateau Pressure:  [18 cmH20-19 cmH20] 19 cmH20  INTAKE / OUTPUT: I/O last 3 completed shifts: In: 3040 [I.V.:1621.7; NG/GT:918.3; IV Piggyback:500] Out: 6962 [Urine:4725; Drains:305]   General appearance:  Frail elderly  on vent  Eyes: anicteric sclera, moist conjunctivae; PERRL, EOMI bilaterally. Mouth:  membranes and no mucosal ulcerations; normal hard and soft palate, orally intubated  Neck: Trachea midline; neck supple, no JVD Lungs/chest: occ rhonchi. Decreased R>L,  with normal respiratory effort and no intercostal retractions CV: RRR, no  MRGs  Abdomen: Soft, distended no masses or HSM Extremities: + peripheral edema Skin: Normal temperature, turgor and texture; no rash, ulcers or subcutaneous nodules Neuro/Psych: agitated at times. Moves all ext but weak. occ follows commands LABS:  BMET  Recent Labs Lab 09/30/16 1054 09/30/16 1713 10/01/16 0444 10/02/16 0300  NA 133*  --  134* 136  K 4.0  --  4.0 3.0*  CL 103  --  102 97*  CO2 23  --  27 34*  BUN 6  --  5* 5*  CREATININE 0.52* 0.51* 0.48* 0.36*  GLUCOSE 152*  --  205* 51*    Electrolytes  Recent Labs Lab 09/30/16 1054 09/30/16 1713 10/01/16 0444 10/01/16 1709 10/02/16 0300  CALCIUM 7.8*  --  7.9*  --  7.8*  MG 2.1 2.2 1.9 1.9  --   PHOS 2.8 2.2* 2.2* 1.8*  --     CBC  Recent Labs Lab 09/30/16 1713 10/01/16 0444 10/02/16 0300  WBC 13.5* 11.4* 13.7*  HGB 10.6* 10.3* 10.3*  HCT 33.9* 33.5* 32.0*  PLT 561* 508* 461*    Coag's No results for input(s): APTT, INR in the last 168 hours.  Sepsis Markers  Recent Labs Lab 09/29/16 1741 09/30/16 0322 10/01/16 2335  LATICACIDVEN 2.8* 2.6* 2.3*    ABG  Recent Labs Lab 09/29/16 2245 09/30/16 0345 10/01/16 0331  PHART 7.396 7.340* 7.406  PCO2ART 38.3 45.2 41.0  PO2ART 100.0 99.0 91.0  Liver Enzymes  Recent Labs Lab 09/29/16 1500 10/01/16 0444 10/02/16 0300  AST 16 19 21   ALT 18 14* 13*  ALKPHOS 61 55 57  BILITOT 1.9* 0.5 0.5  ALBUMIN 2.4* 1.7* 1.7*    Cardiac Enzymes  Recent Labs Lab 09/29/16 1726  TROPONINI 0.07*    Glucose  Recent Labs Lab 10/01/16 2321 10/01/16 2337 10/02/16 0307 10/02/16 0352 10/02/16 0842 10/02/16 0936  GLUCAP 45* 189* 45* 118* 55* 132*    Imaging Ct Chest Wo Contrast  Result Date: 10/01/2016 CLINICAL DATA:  Follow-up pleural effusions. Prior thoracentesis. Initial encounter. EXAM: CT CHEST WITHOUT CONTRAST TECHNIQUE: Multidetector CT imaging of the chest was performed following the standard protocol without IV contrast.  COMPARISON:  Chest radiograph performed earlier today at 7:04 p.m. FINDINGS: Cardiovascular: Diffuse coronary artery calcifications are seen. The heart remains normal in size. The left PICC is noted ending about the proximal right atrium. Scattered calcification is seen along the thoracic aorta and proximal great vessels. Mediastinum/Nodes: Visualized mediastinal nodes are borderline normal in size. Trace pericardial fluid remains within normal limits. The thyroid gland are grossly unremarkable. No axillary lymphadenopathy is seen. Lungs/Pleura: Moderate to large right and small left pleural effusions are noted, with partial consolidation of both lower lung lobes. Mild peripheral airspace opacities are noted bilaterally, possibly reflecting a mild infectious process. No pneumothorax is seen. No masses are identified. Upper Abdomen: A cholecystostomy tube is noted. The gallbladder is decompressed and not well characterized. A 2.9 cm cystic focus is noted along the anterior aspect of the liver. A calcified granuloma is seen within the liver. The spleen is grossly unremarkable. Trace ascites is seen tracking about the liver and spleen. The visualized portions of the pancreas, adrenal glands and kidneys are within normal limits. Nonspecific perinephric stranding is noted bilaterally. Scattered calcification is seen along the proximal abdominal aorta and its branches. Musculoskeletal: No acute osseous abnormalities are identified. Anterior bridging osteophytes are seen along the thoracic spine. The patient is status post vertebroplasty along the lower thoracic spine, at T11-L1. The visualized musculature is unremarkable in appearance. IMPRESSION: 1. Moderate to large right and small left pleural effusions, with partial consolidation of both lower lung lobes. Mild peripheral airspace opacities seen bilaterally, possibly reflecting a mild infectious process. 2. Trace ascites noted tracking about the liver and spleen. 3.  Diffuse coronary artery calcifications seen. 4. Scattered aortic atherosclerosis. 5. 2.9 cm cystic focus along the anterior aspect of the liver. This is nonspecific but may reflect a cyst. Electronically Signed   By: Garald Balding M.D.   On: 10/01/2016 22:24   Dg Chest Port 1 View  Result Date: 10/02/2016 CLINICAL DATA:  Pneumonia EXAM: PORTABLE CHEST 1 VIEW COMPARISON:  10/01/2016 FINDINGS: Cardiac shadow is stable. Endotracheal tube and left-sided PICC line are noted and stable. Bilateral pleural effusions are noted right greater than left with associated consolidation. The overall appearance is stable. Cholecystostomy tube is noted in the right upper quadrant. No bony abnormality is noted. IMPRESSION: Stable appearance of the chest with bilateral effusions and bibasilar consolidation. Electronically Signed   By: Inez Catalina M.D.   On: 10/02/2016 07:32   Dg Chest Port 1 View  Result Date: 10/01/2016 CLINICAL DATA:  Confirm line placement. EXAM: PORTABLE CHEST 1 VIEW COMPARISON:  Chest radiograph October 01, 2016 FINDINGS: LEFT PICC distal tip projects in proximal RIGHT atrium. Endotracheal tube tip projects 3.4 cm above the carina. Nasogastric tube past proximal stomach, distal tip not imaged. RIGHT upper quadrant pigtail  drainage catheter. Cardiac silhouette is likely mildly enlarged, predominately obscured by bilateral pleural effusions and patchy airspace opacities. Mediastinal silhouette is nonsuspicious, calcified aortic knob. No pneumothorax. Old RIGHT rib fractures. IMPRESSION: LEFT PICC distal tip projects in RIGHT atrium, recommend 1-2 cm of retraction. No apparent change in remaining life support lines. Similar bilateral pleural effusions and patchy airspace opacities. Electronically Signed   By: Elon Alas M.D.   On: 10/01/2016 19:25   Dg Abd Portable 1v  Result Date: 10/01/2016 CLINICAL DATA:  Orogastric tube placement EXAM: PORTABLE ABDOMEN - 1 VIEW COMPARISON:  Portable exam 1948  hours compared to 09/30/2016 FINDINGS: Tip of nasogastric tube projects over mid stomach. Bowel gas pattern normal. Bones demineralized with prior vertebroplasties at T11, T12 and L1. IMPRESSION: Tip of orogastric tube projects over mid stomach. Electronically Signed   By: Lavonia Dana M.D.   On: 10/01/2016 20:02  bilateral infiltrates. Right>left. A little better aeration when c/w prior film    STUDIES:  CTc hest 3/29 >> BL effusions, Rt >> Lt, BLL ASD, trace ascites  CULTURES: BCx2 3/26 > Tracheal aspirate 3/27 mod wbc, no orgs>>> Body fluid culture 3/27: rare GPC pairs>>>  ANTIBIOTICS: Ceftaz 3/27 > Vancomycin 3/27 >  SIGNIFICANT EVENTS: 3/27 admitted w/ septic shock d/t HCAP and on-going cholecystitis. Got additional volume + 3.3 liters. Weaned pressors to off. Looked at right chest for thora. Agitation and body habitus made window for safe sampling small. Decided to hold off.  3/28 wean sedating gtts to off. Weaning on PSV.   LINES/TUBES: ETT 3/26 > CVL 3/26 >  DISCUSSION:  Improved, but BL effusions persist , tolerating PS 5/5    ASSESSMENT / PLAN:  Acute Hypoxic respiratory failure in setting of HCAP +/- R>L effusions -has small to moderate right effusion. Ideally should be sampled but body habitus, kyphosis & vent make this risky  Plan SBTs with goal extuabtion Consider repeat US guided thora post extuabtion Cont BDs   Septic shock d/t HCAP AND Acute Cholecystitis  -resolved -sputum no orgs to date -GPC growing from perc drain  Plan Off levo  Holding antihypertensives f/u culture data vanc 3/26>>> fortaz 3/26>>>  Hypokalemia - repleted  PAF. ? New and in and out of NSR Plan Cont tele At 27 & bedbound not a AC candidate  Rate control strategy with BB as needed  Recent cholecystitis s/p perc drain placed 3/19 Plan Keep Perc drain  f/u culture data Cont abx   Chronic anemia w/out s/sx bleeding Plan Trend CBC Transfuse per protocol    DM w/  hyperglycemia , now hypoglycemic 3/29 since Tfs held & on lantus Plan SSI Hold lantus & TF coverage Add D10   Acute encephalopathy   w/ h/o mild dementia Plan Dc fent gtt Dc precedex gtt PRN fent only  Abd distention Plan Had BM 3/28 with Dulcolax   DVT prophylaxis: heparin  SUP: H2B Diet:  Activity: bedrest Disposition : icu.   Updated daughters daily, Limited code status issued, no reintubation   My critical care time 35 minutes   Kara Mead MD. FCCP. [facility] Pager [phone] If no response call [phone]     10/02/2016 12:02 PM

## 2016-10-03 ENCOUNTER — Inpatient Hospital Stay (HOSPITAL_COMMUNITY): Payer: Medicare Other

## 2016-10-03 LAB — CBC
HCT: 31.2 % — ABNORMAL LOW (ref 39.0–52.0)
Hemoglobin: 9.8 g/dL — ABNORMAL LOW (ref 13.0–17.0)
MCH: 31.9 pg (ref 26.0–34.0)
MCHC: 31.4 g/dL (ref 30.0–36.0)
MCV: 101.6 fL — ABNORMAL HIGH (ref 78.0–100.0)
PLATELETS: 403 10*3/uL — AB (ref 150–400)
RBC: 3.07 MIL/uL — AB (ref 4.22–5.81)
RDW: 13.3 % (ref 11.5–15.5)
WBC: 10.8 10*3/uL — AB (ref 4.0–10.5)

## 2016-10-03 LAB — MAGNESIUM: MAGNESIUM: 1.7 mg/dL (ref 1.7–2.4)

## 2016-10-03 LAB — BODY FLUID CULTURE: Gram Stain: NONE SEEN

## 2016-10-03 LAB — BASIC METABOLIC PANEL
ANION GAP: 6 (ref 5–15)
BUN: 5 mg/dL — ABNORMAL LOW (ref 6–20)
CO2: 35 mmol/L — ABNORMAL HIGH (ref 22–32)
Calcium: 7.9 mg/dL — ABNORMAL LOW (ref 8.9–10.3)
Chloride: 95 mmol/L — ABNORMAL LOW (ref 101–111)
Creatinine, Ser: 0.36 mg/dL — ABNORMAL LOW (ref 0.61–1.24)
GFR calc Af Amer: >60 mL/min (ref >60)
Glucose, Bld: 108 mg/dL — ABNORMAL HIGH (ref 65–99)
POTASSIUM: 3 mmol/L — AB (ref 3.5–5.1)
Sodium: 136 mmol/L (ref 135–145)

## 2016-10-03 LAB — GLUCOSE, CAPILLARY
GLUCOSE-CAPILLARY: 136 mg/dL — AB (ref 65–99)
GLUCOSE-CAPILLARY: 128 mg/dL — AB (ref 65–99)
GLUCOSE-CAPILLARY: 131 mg/dL — AB (ref 65–99)
Glucose-Capillary: 95 mg/dL (ref 65–99)
Glucose-Capillary: 117 mg/dL — ABNORMAL HIGH (ref 65–99)

## 2016-10-03 LAB — PHOSPHORUS: Phosphorus: 2.6 mg/dL (ref 2.5–4.6)

## 2016-10-03 MED ORDER — INSULIN ASPART 100 UNIT/ML ~~LOC~~ SOLN
0.0000 [IU] | SUBCUTANEOUS | Status: DC
  Administered 2016-10-03 (×2): 1 [IU] via SUBCUTANEOUS
  Administered 2016-10-04: 2 [IU] via SUBCUTANEOUS
  Administered 2016-10-04 (×2): 1 [IU] via SUBCUTANEOUS
  Administered 2016-10-04 – 2016-10-05 (×3): 2 [IU] via SUBCUTANEOUS
  Administered 2016-10-05: 3 [IU] via SUBCUTANEOUS
  Administered 2016-10-05: 2 [IU] via SUBCUTANEOUS
  Administered 2016-10-05: 1 [IU] via SUBCUTANEOUS
  Administered 2016-10-05: 2 [IU] via SUBCUTANEOUS
  Administered 2016-10-05: 1 [IU] via SUBCUTANEOUS
  Administered 2016-10-06: 2 [IU] via SUBCUTANEOUS
  Administered 2016-10-06 – 2016-10-07 (×6): 1 [IU] via SUBCUTANEOUS
  Administered 2016-10-07: 2 [IU] via SUBCUTANEOUS
  Administered 2016-10-07: 1 [IU] via SUBCUTANEOUS
  Administered 2016-10-08 (×2): 2 [IU] via SUBCUTANEOUS
  Administered 2016-10-08 – 2016-10-09 (×5): 1 [IU] via SUBCUTANEOUS
  Administered 2016-10-09: 2 [IU] via SUBCUTANEOUS
  Administered 2016-10-10 (×2): 1 [IU] via SUBCUTANEOUS
  Administered 2016-10-10 (×3): 2 [IU] via SUBCUTANEOUS
  Administered 2016-10-11: 1 [IU] via SUBCUTANEOUS
  Administered 2016-10-11 (×2): 3 [IU] via SUBCUTANEOUS
  Administered 2016-10-11 (×2): 1 [IU] via SUBCUTANEOUS
  Administered 2016-10-12 (×2): 2 [IU] via SUBCUTANEOUS
  Administered 2016-10-12 (×2): 1 [IU] via SUBCUTANEOUS
  Administered 2016-10-12: 2 [IU] via SUBCUTANEOUS
  Administered 2016-10-12: 1 [IU] via SUBCUTANEOUS
  Administered 2016-10-13: 2 [IU] via SUBCUTANEOUS
  Administered 2016-10-13 – 2016-10-14 (×6): 1 [IU] via SUBCUTANEOUS
  Administered 2016-10-14 (×2): 2 [IU] via SUBCUTANEOUS
  Administered 2016-10-14 (×2): 1 [IU] via SUBCUTANEOUS
  Administered 2016-10-14 – 2016-10-15 (×2): 2 [IU] via SUBCUTANEOUS
  Administered 2016-10-15: 1 [IU] via SUBCUTANEOUS

## 2016-10-03 MED ORDER — ACETAMINOPHEN 325 MG PO TABS
650.0000 mg | ORAL_TABLET | Freq: Four times a day (QID) | ORAL | Status: DC | PRN
Start: 2016-10-03 — End: 2016-10-14

## 2016-10-03 MED ORDER — MIRTAZAPINE 30 MG PO TABS
30.0000 mg | ORAL_TABLET | Freq: Every day | ORAL | Status: DC
  Administered 2016-10-05 – 2016-10-13 (×8): 30 mg via ORAL
  Filled 2016-10-03 (×10): qty 1

## 2016-10-03 MED ORDER — DOCUSATE SODIUM 100 MG PO CAPS: 100.0000 mg | ORAL_CAPSULE | Freq: Every day | ORAL | Status: DC | PRN

## 2016-10-03 MED ORDER — POLYETHYLENE GLYCOL 3350 17 G PO PACK
17.0000 g | PACK | Freq: Every day | ORAL | Status: DC | PRN
  Filled 2016-10-03: qty 1

## 2016-10-03 MED ORDER — ORAL CARE MOUTH RINSE
15.0000 mL | Freq: Two times a day (BID) | OROMUCOSAL | Status: DC
  Administered 2016-10-03 – 2016-10-14 (×20): 15 mL via OROMUCOSAL

## 2016-10-03 MED ORDER — SODIUM CHLORIDE 0.9 % IV SOLN
Freq: Once | INTRAVENOUS | Status: AC
  Administered 2016-10-03: 06:00:00 via INTRAVENOUS
  Filled 2016-10-03: qty 1000

## 2016-10-03 MED ORDER — POTASSIUM CHLORIDE CRYS ER 20 MEQ PO TBCR
20.0000 meq | EXTENDED_RELEASE_TABLET | Freq: Two times a day (BID) | ORAL | Status: AC
  Filled 2016-10-03: qty 1

## 2016-10-03 MED ORDER — ACETAMINOPHEN 325 MG PO TABS: 650.0000 mg | ORAL_TABLET | Freq: Once | ORAL | Status: DC | PRN

## 2016-10-03 MED ORDER — DOCUSATE SODIUM 100 MG PO CAPS
100.0000 mg | ORAL_CAPSULE | Freq: Two times a day (BID) | ORAL | Status: DC
  Administered 2016-10-06 – 2016-10-09 (×5): 100 mg via ORAL
  Filled 2016-10-03 (×10): qty 1

## 2016-10-03 NOTE — Progress Notes (Addendum)
Pt's CT head showed stroke. Pt's DNR. MD on call was paged to ask if we are doing stroke work up? Pt has a foley since 3/26. MD was asked if we should we d/c it or cont to monitor output. Will continue to monitor.  At 8:30 pm, MD returned call. He advised to continue to monitor pt. No interventions for stroke work up the overnight per MD. Continue fluid and NPO per MD. This RN will do neuro checks and NIH throughout the night. MD advised to keep the foley in for urinary retention. Will continue to monitor.

## 2016-10-03 NOTE — Progress Notes (Addendum)
  Speech Language Pathology Treatment: Dysphagia  Patient Details Name: Stephen Bates MRN: [id number] DOB: 22-Apr-1927 Today's Date: 10/03/2016 Time: 1550-1606 SLP Time Calculation (min) (ACUTE ONLY): 16 min  Assessment / Plan / Recommendation Clinical Impression  Followed up per daughters' request to determine if pt more amenable to eating/drinking.  Speaking more spontaneously this afternoon, still limited to single words.  Pt consumed single sips of water, which consistently evoked coughing.  Cough is weak.  Declined further POs - purees/solids/thickened liquids.  Discussed with his daughters. Recommend NPO given concerns for airway protection. SLP will f/u next date for ongoing assessment.    HPI HPI: 83 year old male with PMH significant for DM, bedbound, dementia, prostate Ca, and CVA. He was also recently admitted 3/14 >3/21 to [facility] for cholecystitis treated with IV abx and cystostomy placement. Hospital course complicated by PAF, which resolved spontaneously. He was discharged to home with home health nursing. 3/26 he presented to ED after being found unresponsive at home. He remained unresponsive in ED and was intubated for airway protection. He was transferred to [facility] for ICU care.  ETT 3/26-3/29.  Dx acute hypoxic resp failure in setting of HCAP, septic shock - resolved.  Increased weakness, gaze preference warranted head CT, which revealed acute vs subacute left MCA CVA.       SLP Plan  Continue with current plan of care       Recommendations  Diet recommendations: NPO                Oral Care Recommendations: Oral care QID SLP Visit Diagnosis: Dysphagia, unspecified (R13.10) Plan: Continue with current plan of care       GO               Stephen Bates, [place] CCC/SLP Pager [phone]  Stephen Bates 10/03/2016, 4:07 PM

## 2016-10-03 NOTE — Progress Notes (Signed)
[place] Progress Note Patient Name: Stephen Bates DOB: 02/03/1927 MRN: [id number]   Date of Service  10/03/2016  HPI/Events of Note  Hypokalemia  eICU Interventions  Potassium replaced     Intervention Category Intermediate Interventions: Electrolyte abnormality - evaluation and management  DETERDING,ELIZABETH 10/03/2016, 5:47 AM

## 2016-10-03 NOTE — Evaluation (Addendum)
Physical Therapy Evaluation/ Discharge Patient Details Name: Stephen Bates MRN: [id number] DOB: [birth year] Today's Date: 10/03/2016   History of Present Illness  83 yo admitted with SOB, sepsis, Right pleural effusion with ETT 3/26-3/29. PMHx: DM, dementia, prostate CA, CVA, non ambulatory since head injury in 2014  Clinical Impression  Pt with flat affect, no verbal responses throughout session other than limited yes or no. Pt with grossly functional PROM of bil UE with tight hips bil and able to assist with AAROM. Pt with heavy reliance on caregivers at baseline and non-ambulatory for several years. Pt remains total assist for all mobility with decreased strength and function who will benefit from SNF due to extensive assist, decreased functional mobility and current lack of caregiver due to wife illness. Recommend ROM daily with nursing assist but all other therapy needs may be addressed at SNF.     Follow Up Recommendations SNF;Supervision/Assistance - 24 hour    Equipment Recommendations  None recommended by PT    Recommendations for Other Services       Precautions / Restrictions Precautions Precautions: Fall      Mobility  Bed Mobility Overal bed mobility: Needs Assistance Bed Mobility: Rolling Rolling: Total assist         General bed mobility comments: pt resisting with attempts at rolling right with cues, assist and facilitation for trunk elevation and rotation to roll. With rolling left pt not resistant but remains total assist with very limited assist to rotate trunk to initiate rolling with facilitation  Transfers                    Ambulation/Gait                Stairs            Wheelchair Mobility    Modified Rankin (Stroke Patients Only)       Balance                                             Pertinent Vitals/Pain Pain Assessment:  (PAINAD= 0)    Home Living Family/patient expects to be discharged  to:: Private residence Living Arrangements: Spouse/significant other Available Help at Discharge: Family;Personal care attendant;Available 24 hours/day Type of Home: House Home Access: Ramped entrance     Home Layout: One level Home Equipment: Walker - 2 wheels;Hospital bed;Wheelchair - [employer];Other (comment) (hoyer lift)      Prior Function Level of Independence: Needs assistance   Gait / Transfers Assistance Needed: per daughter pt is total assist with hoyer lift for OOB to chair for appointments only, mod assist for rolling and otherwise totally dependent           Hand Dominance        Extremity/Trunk Assessment   Upper Extremity Assessment Upper Extremity Assessment: Generalized weakness (pt with grossly functional PROM with 2-/5 strength)    Lower Extremity Assessment Lower Extremity Assessment: Generalized weakness (pt with bil tight hip abductors, 2-/5 gross strength assessment with decreased hip flexion )       Communication      Cognition Arousal/Alertness: Awake/alert Behavior During Therapy: Flat affect Overall Cognitive Status: No family/caregiver present to determine baseline cognitive functioning  General Comments: pt non verbal throughout session other than stating "yes" and "no" one time each but not to appropriate questions      General Comments      Exercises General Exercises - Upper Extremity Shoulder Flexion: AAROM;Both;Supine;10 reps Elbow Flexion: AAROM;Both;Supine;10 reps Elbow Extension: AAROM;10 reps;Both;Supine General Exercises - Lower Extremity Heel Slides: AAROM;Both;Supine;10 reps Hip ABduction/ADduction: AAROM;Both;Supine;10 reps   Assessment/Plan    PT Assessment All further PT needs can be met in the next venue of care  PT Problem List Decreased strength;Decreased mobility;Decreased activity tolerance;Decreased cognition;Decreased skin integrity       PT  Treatment Interventions      PT Goals (Current goals can be found in the Care Plan section)  Acute Rehab PT Goals PT Goal Formulation: All assessment and education complete, DC therapy    Frequency     Barriers to discharge Decreased caregiver support wife currently admitted in hospital    Co-evaluation               End of Session   Activity Tolerance: Patient tolerated treatment well Patient left: in bed;with call bell/phone within reach;with bed alarm set Nurse Communication: Mobility status PT Visit Diagnosis: Muscle weakness (generalized) (M62.81)    Time: 3779-3968 PT Time Calculation (min) (ACUTE ONLY): 13 min   Charges:   PT Evaluation $PT Eval Moderate Complexity: 1 Procedure     PT G Codes:        Elwyn Reach, PT [phone]   [place] 10/03/2016, 12:00 PM

## 2016-10-03 NOTE — Evaluation (Signed)
Clinical/Bedside Swallow Evaluation Patient Details  Name: Stephen Bates MRN: [id number] Date of Birth: 07-Jun-1927  Today's Date: 10/03/2016 Time: SLP Start Time (ACUTE ONLY): 1058 SLP Stop Time (ACUTE ONLY): 1113 SLP Time Calculation (min) (ACUTE ONLY): 15 min  Past Medical History:  Past Medical History:  Diagnosis Date  . Arthritis   . Benign neoplasm of colon   . Chronic back pain   . Diabetes ([place])   . Frequent falls 2012  . History of right bundle branch block   . Hypertension   . Lumbar radiculopathy   . Mild dementia   . Neuropathy ([place])   . Prostate cancer ([place])   . Stroke ([place]) 1982  . Weakness of both legs   . Wears glasses    Past Surgical History:  Past Surgical History:  Procedure Laterality Date  . COLONOSCOPY    . ERCP N/A 05/24/2015   Procedure: ENDOSCOPIC RETROGRADE CHOLANGIOPANCREATOGRAPHY;  Surgeon: Rogene Houston, MD;  Location: [facility];  Service: Endoscopy;  Laterality: N/A;  . EYE SURGERY  2010   both cataracts  . INSERTION PROSTATE RADIATION SEED  2001  . IR GENERIC HISTORICAL  09/22/2016   IR PERC CHOLECYSTOSTOMY 09/22/2016 Aletta Edouard, MD [facility]-INTER[facility]  . kyphoplasty    . MOHS SURGERY  12/12/2008  . SPHINCTEROTOMY N/A 05/24/2015   Procedure: BILIARY SPHINCTEROTOMY;  Surgeon: Rogene Houston, MD;  Location: [facility]S;  Service: Endoscopy;  Laterality: N/A;   HPI:  83 year old male with PMH significant for DM, bedbound, dementia, prostate Ca, and CVA. He was also recently admitted 3/14 >3/21 to [facility] for cholecystitis treated with IV abx and cystostomy placement. Hospital course complicated by PAF, which resolved spontaneously. He was discharged to home with home health nursing. 3/26 he presented to ED after being found unresponsive at home. He remained unresponsive in ED and was intubated for airway protection. He was transferred to [facility] for ICU care.  ETT 3/26-3/29.  Dx acute hypoxic resp failure in setting of HCAP, septic shock -  resolved.   Assessment / Plan / Recommendation Clinical Impression  Pt minimally participatory.  Not following commands; limited spontaneous voicing. Alert.  Accepted only minimal ice chips with delayed throat-clearing/cough; refused further trials of water or additional POs.  Daughters present with many questions about swallowing.  Continue NPO status for now - SLP will f/u later this pm or next date to determine readiness for POs.    SLP Visit Diagnosis: Dysphagia, unspecified (R13.10)    Aspiration Risk       Diet Recommendation   NPO       Other  Recommendations Oral Care Recommendations: Oral care QID   Follow up Recommendations  (tba)      Frequency and Duration min 2x/week  2 weeks       Prognosis Prognosis for Safe Diet Advancement: Guarded      Swallow Study   General Date of Onset: 09/29/16 HPI: 83 year old male with PMH significant for DM, bedbound, dementia, prostate Ca, and CVA. He was also recently admitted 3/14 >3/21 to [facility] for cholecystitis treated with IV abx and cystostomy placement. Hospital course complicated by PAF, which resolved spontaneously. He was discharged to home with home health nursing. 3/26 he presented to ED after being found unresponsive at home. He remained unresponsive in ED and was intubated for airway protection. He was transferred to [facility] for ICU care.  ETT 3/26-3/29.  Dx acute hypoxic resp failure in setting  of HCAP, septic shock - resolved. Type of Study: Bedside Swallow Evaluation Previous Swallow Assessment: no Diet Prior to this Study: NPO Temperature Spikes Noted: No Respiratory Status: Nasal cannula History of Recent Intubation: Yes Length of Intubations (days): 3 days Date extubated: 10/02/16 Behavior/Cognition: Alert Oral Cavity Assessment: Within Functional Limits Oral Care Completed by SLP: No Oral Cavity - Dentition: Adequate natural dentition Vision:  (difficult to assess) Self-Feeding Abilities: Total  assist Patient Positioning: Upright in bed Baseline Vocal Quality: Low vocal intensity Volitional Cough: Cognitively unable to elicit Volitional Swallow: Unable to elicit    Oral/Motor/Sensory Function Overall Oral Motor/Sensory Function:  (symmetry at rest)   Ice Chips Ice chips: Within functional limits Other Comments: delayed cough   Thin Liquid Thin Liquid: Not tested (refused)    Nectar Thick Nectar Thick Liquid: Not tested   Honey Thick Honey Thick Liquid: Not tested   Puree Puree: Not tested   Solid   GO   Solid: Not tested        [name] 10/03/2016,11:24 AM Estill Bamberg L. Tivis Ringer, [place] CCC/SLP Pager [phone]

## 2016-10-03 NOTE — Progress Notes (Signed)
Pt. Arrived to unit 5W. Pt. Alert but unable to answer orientation questions. Pt. Speech incomprehensible, gaze favoring one side, weak grip on left side, absent grip on right side with drift. Called family to inquire about patient's baseline. Daughters at bedside observed pt. Behavior with second nursing assessment and said this was different from patient's baseline. MD notified.

## 2016-10-03 NOTE — Progress Notes (Signed)
[facility] TEAM 1 - Stepdown/ICU TEAM  ABASS MISENER  HQP:[id number] DOB: 1927/06/18 DOA: 09/29/2016 PCP: Asencion Noble, MD    Brief Narrative:  83 year old male with hx of DM, dementia, prostate CA, and CVA who is bedbound. He was admitted 3/14 > 3/21 to [facility] for cholecystitis treated with IV antibiotics and cystostomy placement. Hospital course complicated by PAF, which resolved spontaneously. He was discharged to home with home health nursing. 3/26 he returned to the ED after being found unresponsive at home. He remained unresponsive in ED and was intubated for airway protection. He required norepinephrine for shock. CXR was concerning for multifocal PNA. He was transferred to [facility] for ICU care.   Significant Events: 3/27 admitted w/ septic shock d/t HCAP and on-going cholecystitis. Got additional volume + 3.3 liters. Weaned pressors to off. Looked at right chest for thora. Agitation and body habitus made window for safe sampling small. Decided to hold off.  3/28 weaned sedating gtts to off. Weaning on PSV.  3/30 [facility] assumed care   Subjective: The pt is alert but confused.  He is not able to provide a reliable hx.  He does not appear to be in resp distress or suffering w/ any uncontrolled pain.    Assessment & Plan:  Septic shock with Acute Hypoxic respiratory failure due to HCAP w/ R>L effusions Improving clinically - cont empiric abx - follow effusions w/ serial CXRs - wean O2 as able   Recent acute cholecystitis s/p perc drain placed 3/19 Perc drain to remain - no new info from culture   Hypokalemia Replace and follow - Mg is normal   Newly appreciated PAF in and out of NSR - follow on tele   Chronic anemia w/out s/sx bleeding Hgb stable - follow   DM  CBG currently well controlled   Acute encephalopathy -  mild chronic dementia Minimize sedating meds - follow clinically - check Z99 and folic acid levels given macrocytosis   DVT prophylaxis: SQ heparin  Code  Status: DNR - NO CODE Family Communication: no family present at time of exam  Disposition Plan: transfer to tele bed - begin PT/OT   Consultants:  PCCM  Antimicrobials:  Ceftaz 3/27 > Vancomycin 3/27 >  Objective: Blood pressure (!) 112/59, pulse 72, temperature 98.1 F (36.7 C), resp. rate (!) 22, height 5\' 4"  (1.626 m), weight 62.6 kg (138 lb 0.1 oz), SpO2 100 %.  Intake/Output Summary (Last 24 hours) at 10/03/16 0933 Last data filed at 10/03/16 0900  Gross per 24 hour  Intake           2878.9 ml  Output             2520 ml  Net            358.9 ml   Filed Weights   10/01/16 0500 10/02/16 0500 10/03/16 0500  Weight: 68.7 kg (151 lb 7.3 oz) 65 kg (143 lb 4.8 oz) 62.6 kg (138 lb 0.1 oz)    Examination: General: No acute respiratory distress at rest but requiring 4L [place] O2 Lungs: poor air movement B bases - no wheezing  Cardiovascular: Regular rate and rhythm without murmur  Abdomen: Nontender, moderately distended, soft, bowel sounds hypoactive, no rebound, no ascites, no appreciable mass Extremities: No significant cyanosis, clubbing, or edema bilateral lower extremities  CBC:  Recent Labs Lab 09/29/16 1500 09/30/16 0321 09/30/16 1713 10/01/16 0444 10/02/16 0300 10/03/16 0338  WBC 10.4 12.3* 13.5* 11.4* 13.7* 10.8*  NEUTROABS 8.8*  --   --   --   --   --   HGB 11.5* 10.0* 10.6* 10.3* 10.3* 9.8*  HCT 35.7* 31.0* 33.9* 33.5* 32.0* 31.2*  MCV 105.0* 104.7* 104.3* 104.0* 101.6* 101.6*  PLT 687* 476* 561* 508* 461* 440*   Basic Metabolic Panel:  Recent Labs Lab 09/30/16 0321 09/30/16 1054 09/30/16 1713 10/01/16 0444 10/01/16 1709 10/02/16 0300 10/03/16 0338  NA 135 133*  --  134*  --  136 136  K 2.9* 4.0  --  4.0  --  3.0* 3.0*  CL 103 103  --  102  --  97* 95*  CO2 26 23  --  27  --  34* 35*  GLUCOSE 311* 152*  --  205*  --  51* 108*  BUN 7 6  --  5*  --  5* 5*  CREATININE 0.65 0.52* 0.51* 0.48*  --  0.36* 0.36*  CALCIUM 7.7* 7.8*  --  7.9*  --  7.8*  7.9*  MG 1.7 2.1 2.2 1.9 1.9  --  1.7  PHOS 1.1* 2.8 2.2* 2.2* 1.8*  --  2.6   GFR: Estimated Creatinine Clearance: 54.5 mL/min (A) (by C-G formula based on SCr of 0.36 mg/dL (L)).  Liver Function Tests:  Recent Labs Lab 09/29/16 1500 10/01/16 0444 10/02/16 0300  AST 16 19 21   ALT 18 14* 13*  ALKPHOS 61 55 57  BILITOT 1.9* 0.5 0.5  PROT 6.4* 5.2* 5.2*  ALBUMIN 2.4* 1.7* 1.7*    Recent Labs Lab 09/29/16 1520  AMMONIA 34    Cardiac Enzymes:  Recent Labs Lab 09/29/16 1726  TROPONINI 0.07*    HbA1C: Hgb A1c MFr Bld  Date/Time Value Ref Range Status  08/24/2015 06:51 AM 6.1 (H) 4.8 - 5.6 % Final    Comment:    (NOTE)         Pre-diabetes: 5.7 - 6.4         Diabetes: >6.4         Glycemic control for adults with diabetes: <7.0   05/22/2015 02:22 PM 6.3 (H) 4.8 - 5.6 % Final    Comment:    (NOTE)         Pre-diabetes: 5.7 - 6.4         Diabetes: >6.4         Glycemic control for adults with diabetes: <7.0     CBG:  Recent Labs Lab 10/02/16 1639 10/02/16 1928 10/03/16 0001 10/03/16 0331 10/03/16 0841  GLUCAP 87 107* 129* 95 136*    Recent Results (from the past 240 hour(s))  Blood culture (routine x 2)     Status: None (Preliminary result)   Collection Time: 09/29/16  3:20 PM  Result Value Ref Range Status   Specimen Description BLOOD RIGHT ARM  Final   Special Requests BOTTLES DRAWN AEROBIC AND ANAEROBIC 6CC  Final   Culture NO GROWTH 4 DAYS  Final   Report Status PENDING  Incomplete  Blood culture (routine x 2)     Status: None (Preliminary result)   Collection Time: 09/29/16  3:20 PM  Result Value Ref Range Status   Specimen Description BLOOD RIGHT WRIST  Final   Special Requests BOTTLES DRAWN AEROBIC AND ANAEROBIC 6CC  Final   Culture NO GROWTH 4 DAYS  Final   Report Status PENDING  Incomplete  Culture, respiratory (NON-Expectorated)     Status: None   Collection Time: 09/29/16  5:28 PM  Result Value Ref  Range Status   Specimen  Description TRACHEAL ASPIRATE  Final   Special Requests Normal  Final   Gram Stain   Final    MODERATE WBC PRESENT,BOTH PMN AND MONONUCLEAR NO ORGANISMS SEEN    Culture   Final    Consistent with normal respiratory flora. Performed at [facility], [place] [address], [place], [place] [address]    Report Status 10/02/2016 FINAL  Final  MRSA PCR Screening     Status: None   Collection Time: 09/30/16  1:11 AM  Result Value Ref Range Status   MRSA by PCR NEGATIVE NEGATIVE Final    Comment:        The GeneXpert MRSA Assay (FDA approved for NASAL specimens only), is one component of a comprehensive MRSA colonization surveillance program. It is not intended to diagnose MRSA infection nor to guide or monitor treatment for MRSA infections.   Body fluid culture     Status: None (Preliminary result)   Collection Time: 09/30/16  2:40 AM  Result Value Ref Range Status   Specimen Description GALL BLADDER  Final   Special Requests NONE  Final   Gram Stain NO WBC SEEN RARE GRAM POSITIVE COCCI IN PAIRS   Final   Culture CULTURE REINCUBATED FOR BETTER GROWTH  Final   Report Status PENDING  Incomplete  Body fluid culture     Status: None (Preliminary result)   Collection Time: 10/02/16  2:23 PM  Result Value Ref Range Status   Specimen Description FLUID RIGHT PLEURAL  Final   Special Requests NONE  Final   Gram Stain   Final    ABUNDANT WBC PRESENT,BOTH PMN AND MONONUCLEAR NO ORGANISMS SEEN    Culture PENDING  Incomplete   Report Status PENDING  Incomplete     Scheduled Meds: . cefTAZidime (FORTAZ)  IV  2 g Intravenous Q8H  . Chlorhexidine Gluconate Cloth  6 each Topical Daily  . famotidine  20 mg Per Tube BID  . heparin  5,000 Units Subcutaneous Q8H  . insulin aspart  0-20 Units Subcutaneous Q4H  . mouth rinse  15 mL Mouth Rinse BID  . sodium chloride flush  10-40 mL Intracatheter Q12H  . vancomycin  1,000 mg Intravenous Q12H   Continuous Infusions: . dextrose 40 mL/hr at  10/03/16 0600  . feeding supplement (VITAL AF 1.2 CAL) Stopped (10/01/16 1930)  . lactated ringers 10 mL/hr at 10/02/16 1200     LOS: 4 days   Cherene Altes, MD [facility] Office  [phone] Pager - Text Page per Amion as per below:  On-Call/Text Page:      [email or web address]      password [email or web address]  If 7PM-7AM, please contact night-coverage [email or web address] Password [facility] 10/03/2016, 9:33 AM

## 2016-10-03 NOTE — Consult Note (Signed)
Referring Physician: Dr. Thereasa Solo    Chief Complaint: Subacute left parietal lobe ischemic infarction  HPI: Stephen Bates is an 83 y.o. male with multiple medical problems who was admitted on 3/27 after being found unresponsive at home. He was diagnosed with septic shock and acute hypoxic respiratory failure secondary to HCAP. He was intubated and initially managed in the ICU. Hospital problems have included newly diagnosed PAF and acute encephalopathy on his baseline of dementia. His condition improved, he was extubated and transferred to [address] [place]. A CT head was obtained, revealing a subacute medium sized left parietal lobe ischemic infarction. Neurology was consulted for further recommendations.    Past Medical History:  Diagnosis Date  . Arthritis   . Benign neoplasm of colon   . Chronic back pain   . Diabetes ([place])   . Frequent falls 2012  . History of right bundle branch block   . Hypertension   . Lumbar radiculopathy   . Mild dementia   . Neuropathy ([place])   . Prostate cancer ([place])   . Stroke ([place]) 1982  . Weakness of both legs   . Wears glasses     Past Surgical History:  Procedure Laterality Date  . COLONOSCOPY    . ERCP N/A 05/24/2015   Procedure: ENDOSCOPIC RETROGRADE CHOLANGIOPANCREATOGRAPHY;  Surgeon: Rogene Houston, MD;  Location: [facility];  Service: Endoscopy;  Laterality: N/A;  . EYE SURGERY  2010   both cataracts  . INSERTION PROSTATE RADIATION SEED  2001  . IR GENERIC HISTORICAL  09/22/2016   IR PERC CHOLECYSTOSTOMY 09/22/2016 Aletta Edouard, MD [facility]  . kyphoplasty    . MOHS SURGERY  12/12/2008  . SPHINCTEROTOMY N/A 05/24/2015   Procedure: BILIARY SPHINCTEROTOMY;  Surgeon: Rogene Houston, MD;  Location: [facility];  Service: Endoscopy;  Laterality: N/A;    Family History  Problem Relation Age of Onset  . Bladder Cancer Father   . Bronchopulmonary dysplasia Mother   . Colon cancer Sister    Social History:  reports that he quit smoking about 47 years  ago. He has never used smokeless tobacco. He reports that he drinks about 1.8 oz of alcohol per week . He reports that he does not use drugs.  Allergies: No Known Allergies  Medications:   Current Facility-Administered Medications:  .  acetaminophen (TYLENOL) tablet 650 mg, 650 mg, Oral, Q6H PRN, Cherene Altes, MD .  cefTAZidime (FORTAZ) 2 g in dextrose 5 % 50 mL IVPB, 2 g, Intravenous, Q8H, Rebecka Apley, RPH, 2 g at 10/03/16 1840 .  Chlorhexidine Gluconate Cloth 2 % PADS 6 each, 6 each, Topical, Daily, Raylene Miyamoto, MD, 6 each at 10/03/16 1000 .  dextrose 10 % infusion, , Intravenous, Continuous, Kara Mead V, MD, Last Rate: 40 mL/hr at 10/03/16 0600 .  docusate sodium (COLACE) capsule 100 mg, 100 mg, Oral, BID, Cherene Altes, MD, Stopped at 10/03/16 1030 .  heparin injection 5,000 Units, 5,000 Units, Subcutaneous, Q8H, Corey Harold, NP, 5,000 Units at 10/03/16 1517 .  insulin aspart (novoLOG) injection 0-9 Units, 0-9 Units, Subcutaneous, Q4H, Cherene Altes, MD, 1 Units at 10/03/16 1249 .  MEDLINE mouth rinse, 15 mL, Mouth Rinse, BID, Raylene Miyamoto, MD, 15 mL at 10/03/16 0918 .  mirtazapine (REMERON) tablet 30 mg, 30 mg, Oral, QHS, Cherene Altes, MD .  polyethylene glycol (MIRALAX / GLYCOLAX) packet 17 g, 17 g, Oral, Daily PRN, Cherene Altes, MD .  potassium chloride SA (K-DUR,KLOR-CON)  CR tablet 20 mEq, 20 mEq, Oral, BID, Cherene Altes, MD, Stopped at 10/03/16 1030 .  sodium chloride flush (NS) 0.9 % injection 10-40 mL, 10-40 mL, Intracatheter, Q12H, Raylene Miyamoto, MD, 10 mL at 10/03/16 0919 .  sodium chloride flush (NS) 0.9 % injection 10-40 mL, 10-40 mL, Intracatheter, PRN, Raylene Miyamoto, MD   ROS: Unable to obtain due to aphasia and confusion  Physical Examination: Blood pressure (!) 109/49, pulse 81, temperature 98.4 F (36.9 C), temperature source Oral, resp. rate (!) 22, height 5' 5"  (1.651 m), weight 63.1 kg (139 lb 1.6 oz), SpO2 97  %.  HEENT: Several eschars on scalp. Large dressing also noted.  Lungs: Respirations unlabored Ext: Warm and well-perfused  Neurologic Examination: Mental Status: Awake. Receptive and expressive aphasia with nonsensical agrammatical replies to questions. Attempts to cooperate and is pleasant/non-agitated. Follows about 20% of simple motor commands.  Cranial Nerves: II:  Blinks to threat in right and left visual fields. Pupils reactive bilaterally with mild asymmetry noted.  III,IV, VI: ptosis not present, will track examiner horizontally with conjugate EOM; there is a saccadic quality to his pursuits. No nystagmus.  V,VII: Does not smile to command; face symmetric in this context. Reacts to less to tactile stimuli on right relative to left.  VIII: hearing grossly intact to auditory stimuli IX,X: Hypophonic speech XI: Unable to formally test XII: Tongue is midline with mouth open.  Motor/Sensory: Does not follow most motor commands. Diffusely weak. Handgrip on right is weaker than left. Right arm resists examiner with less force than on left and drops to bed sooner than left after passive elevation. Decreased reaction to tactile stimuli applied to right arm relative to left. Right leg moves with 2-3/5 strength with noxious. Left leg moves with 3-4/5 strength to noxious.  Deep Tendon Reflexes:  0 achilles, 1+ patellae, 1+ brachioradialis and biceps bilaterally.  Right toe tonically upgoing. Left toe downgoing.  Cerebellar: Does not follow commands for testing.  Gait: Unable to test  Results for orders placed or performed during the hospital encounter of 09/29/16 (from the past 48 hour(s))  Glucose, capillary     Status: None   Collection Time: 10/01/16  8:45 PM  Result Value Ref Range   Glucose-Capillary 97 65 - 99 mg/dL  Glucose, capillary     Status: Abnormal   Collection Time: 10/01/16 11:13 PM  Result Value Ref Range   Glucose-Capillary 50 (L) 65 - 99 mg/dL   Comment 1 Notify RN     Comment 2 Document in Chart   Glucose, capillary     Status: Abnormal   Collection Time: 10/01/16 11:21 PM  Result Value Ref Range   Glucose-Capillary 45 (L) 65 - 99 mg/dL   Comment 1 Notify RN    Comment 2 Document in Chart   Lactic acid, plasma     Status: Abnormal   Collection Time: 10/01/16 11:35 PM  Result Value Ref Range   Lactic Acid, Venous 2.3 (HH) 0.5 - 1.9 mmol/L    Comment: CRITICAL RESULT CALLED TO, READ BACK BY AND VERIFIED WITH: SMITH V,RN 10/02/16 0115 WAYK   Glucose, capillary     Status: Abnormal   Collection Time: 10/01/16 11:37 PM  Result Value Ref Range   Glucose-Capillary 189 (H) 65 - 99 mg/dL  CBC     Status: Abnormal   Collection Time: 10/02/16  3:00 AM  Result Value Ref Range   WBC 13.7 (H) 4.0 - 10.5 K/uL   RBC 3.15 (  L) 4.22 - 5.81 MIL/uL   Hemoglobin 10.3 (L) 13.0 - 17.0 g/dL   HCT 32.0 (L) 39.0 - 52.0 %   MCV 101.6 (H) 78.0 - 100.0 fL   MCH 32.7 26.0 - 34.0 pg   MCHC 32.2 30.0 - 36.0 g/dL   RDW 13.2 11.5 - 15.5 %   Platelets 461 (H) 150 - 400 K/uL  Comprehensive metabolic panel     Status: Abnormal   Collection Time: 10/02/16  3:00 AM  Result Value Ref Range   Sodium 136 135 - 145 mmol/L   Potassium 3.0 (L) 3.5 - 5.1 mmol/L    Comment: DELTA CHECK NOTED   Chloride 97 (L) 101 - 111 mmol/L   CO2 34 (H) 22 - 32 mmol/L   Glucose, Bld 51 (L) 65 - 99 mg/dL   BUN 5 (L) 6 - 20 mg/dL   Creatinine, Ser 0.36 (L) 0.61 - 1.24 mg/dL   Calcium 7.8 (L) 8.9 - 10.3 mg/dL   Total Protein 5.2 (L) 6.5 - 8.1 g/dL   Albumin 1.7 (L) 3.5 - 5.0 g/dL   AST 21 15 - 41 U/L   ALT 13 (L) 17 - 63 U/L   Alkaline Phosphatase 57 38 - 126 U/L   Total Bilirubin 0.5 0.3 - 1.2 mg/dL   GFR calc non Af Amer >60 >60 mL/min   GFR calc Af Amer >60 >60 mL/min    Comment: (NOTE) The eGFR has been calculated using the CKD EPI equation. This calculation has not been validated in all clinical situations. eGFR's persistently <60 mL/min signify possible Chronic Kidney Disease.     Anion gap 5 5 - 15  Glucose, capillary     Status: Abnormal   Collection Time: 10/02/16  3:07 AM  Result Value Ref Range   Glucose-Capillary 45 (L) 65 - 99 mg/dL   Comment 1 Notify RN    Comment 2 Document in Chart   Glucose, capillary     Status: Abnormal   Collection Time: 10/02/16  3:52 AM  Result Value Ref Range   Glucose-Capillary 118 (H) 65 - 99 mg/dL   Comment 1 Notify RN    Comment 2 Document in Chart   Glucose, capillary     Status: Abnormal   Collection Time: 10/02/16  8:42 AM  Result Value Ref Range   Glucose-Capillary 55 (L) 65 - 99 mg/dL   Comment 1 Notify RN    Comment 2 Document in Chart   Glucose, capillary     Status: Abnormal   Collection Time: 10/02/16  9:36 AM  Result Value Ref Range   Glucose-Capillary 132 (H) 65 - 99 mg/dL   Comment 1 Document in Chart   Glucose, capillary     Status: None   Collection Time: 10/02/16 12:28 PM  Result Value Ref Range   Glucose-Capillary 84 65 - 99 mg/dL  Body fluid culture     Status: None (Preliminary result)   Collection Time: 10/02/16  2:23 PM  Result Value Ref Range   Specimen Description FLUID RIGHT PLEURAL    Special Requests NONE    Gram Stain      ABUNDANT WBC PRESENT,BOTH PMN AND MONONUCLEAR NO ORGANISMS SEEN    Culture NO GROWTH < 24 HOURS    Report Status PENDING   Lactate dehydrogenase (CSF, pleural or peritoneal fluid)     Status: Abnormal   Collection Time: 10/02/16  2:24 PM  Result Value Ref Range   LD, Fluid 67 (H) 3 - 23  U/L    Comment: (NOTE) Results should be evaluated in conjunction with serum values    Fluid Type-FLDH FLUID     Comment: ABDOMEN PLEURAL CORRECTED ON 03/29 AT 1707: PREVIOUSLY REPORTED AS Pleural Fld   Body fluid cell count with differential     Status: Abnormal   Collection Time: 10/02/16  2:24 PM  Result Value Ref Range   Fluid Type-FCT FLUID     Comment: ABDOMEN PLEURAL    Color, Fluid YELLOW YELLOW   Appearance, Fluid HAZY (A) CLEAR   WBC, Fluid 100 0 - 1,000 cu mm    Neutrophil Count, Fluid 38 (H) 0 - 25 %   Lymphs, Fluid 52 %   Monocyte-Macrophage-Serous Fluid 10 (L) 50 - 90 %   Other Cells, Fluid FEW MESOTHELIAL CELLS %  Protein, pleural or peritoneal fluid     Status: None   Collection Time: 10/02/16  2:24 PM  Result Value Ref Range   Total protein, fluid <3.0 g/dL    Comment: (NOTE) No normal range established for this test Results should be evaluated in conjunction with serum values    Fluid Type-FTP FLUID     Comment: ABDOMEN PLEURAL CORRECTED ON 03/29 AT 1707: PREVIOUSLY REPORTED AS Pleural Fld   Vancomycin, trough     Status: Abnormal   Collection Time: 10/02/16  4:30 PM  Result Value Ref Range   Vancomycin Tr 8 (L) 15 - 20 ug/mL  Glucose, capillary     Status: None   Collection Time: 10/02/16  4:39 PM  Result Value Ref Range   Glucose-Capillary 87 65 - 99 mg/dL   Comment 1 Document in Chart   Glucose, capillary     Status: Abnormal   Collection Time: 10/02/16  7:28 PM  Result Value Ref Range   Glucose-Capillary 107 (H) 65 - 99 mg/dL   Comment 1 Notify RN    Comment 2 Document in Chart   Glucose, capillary     Status: Abnormal   Collection Time: 10/03/16 12:01 AM  Result Value Ref Range   Glucose-Capillary 129 (H) 65 - 99 mg/dL   Comment 1 Notify RN    Comment 2 Document in Chart   Glucose, capillary     Status: None   Collection Time: 10/03/16  3:31 AM  Result Value Ref Range   Glucose-Capillary 95 65 - 99 mg/dL   Comment 1 Notify RN    Comment 2 Document in Chart   CBC     Status: Abnormal   Collection Time: 10/03/16  3:38 AM  Result Value Ref Range   WBC 10.8 (H) 4.0 - 10.5 K/uL   RBC 3.07 (L) 4.22 - 5.81 MIL/uL   Hemoglobin 9.8 (L) 13.0 - 17.0 g/dL   HCT 31.2 (L) 39.0 - 52.0 %   MCV 101.6 (H) 78.0 - 100.0 fL   MCH 31.9 26.0 - 34.0 pg   MCHC 31.4 30.0 - 36.0 g/dL   RDW 13.3 11.5 - 15.5 %   Platelets 403 (H) 150 - 400 K/uL  Basic metabolic panel     Status: Abnormal   Collection Time: 10/03/16  3:38 AM   Result Value Ref Range   Sodium 136 135 - 145 mmol/L   Potassium 3.0 (L) 3.5 - 5.1 mmol/L   Chloride 95 (L) 101 - 111 mmol/L   CO2 35 (H) 22 - 32 mmol/L   Glucose, Bld 108 (H) 65 - 99 mg/dL   BUN 5 (L) 6 - 20 mg/dL  Creatinine, Ser 0.36 (L) 0.61 - 1.24 mg/dL   Calcium 7.9 (L) 8.9 - 10.3 mg/dL   GFR calc non Af Amer >60 >60 mL/min   GFR calc Af Amer >60 >60 mL/min    Comment: (NOTE) The eGFR has been calculated using the CKD EPI equation. This calculation has not been validated in all clinical situations. eGFR's persistently <60 mL/min signify possible Chronic Kidney Disease.    Anion gap 6 5 - 15  Magnesium     Status: None   Collection Time: 10/03/16  3:38 AM  Result Value Ref Range   Magnesium 1.7 1.7 - 2.4 mg/dL  Phosphorus     Status: None   Collection Time: 10/03/16  3:38 AM  Result Value Ref Range   Phosphorus 2.6 2.5 - 4.6 mg/dL  Glucose, capillary     Status: Abnormal   Collection Time: 10/03/16  8:41 AM  Result Value Ref Range   Glucose-Capillary 136 (H) 65 - 99 mg/dL   Comment 1 Notify RN    Comment 2 Document in Chart   Glucose, capillary     Status: Abnormal   Collection Time: 10/03/16 12:30 PM  Result Value Ref Range   Glucose-Capillary 128 (H) 65 - 99 mg/dL   Comment 1 Notify RN    Comment 2 Document in Chart   Glucose, capillary     Status: Abnormal   Collection Time: 10/03/16  5:49 PM  Result Value Ref Range   Glucose-Capillary 117 (H) 65 - 99 mg/dL   Ct Head Wo Contrast  Result Date: 10/03/2016 CLINICAL DATA:  Acute stroke EXAM: CT HEAD WITHOUT CONTRAST TECHNIQUE: Contiguous axial images were obtained from the base of the skull through the vertex without intravenous contrast. COMPARISON:  None. FINDINGS: Brain: Large area of low attenuation with effacement of the normal gray-white differentiation in the left parietal lobe most concerning for an acute or subacute cerebral infarct. No evidence of acute hemorrhage, extra-axial collection,  ventriculomegaly, or mass effect. Generalized cerebral atrophy. Periventricular white matter low attenuation likely secondary to microangiopathy. Vascular: Cerebrovascular atherosclerotic calcifications are noted. Skull: Negative for fracture or focal lesion. Sinuses/Orbits: Visualized portions of the orbits are unremarkable. Mild right maxillary sinus mucosal thickening. Small right mastoid effusion. Other: None. IMPRESSION: 1. Acute versus subacute nonhemorrhagic left parietal lobe (left MCA territory) infarct. Electronically Signed   By: Kathreen Devoid   On: 10/03/2016 15:12   Ct Chest Wo Contrast  Result Date: 10/01/2016 CLINICAL DATA:  Follow-up pleural effusions. Prior thoracentesis. Initial encounter. EXAM: CT CHEST WITHOUT CONTRAST TECHNIQUE: Multidetector CT imaging of the chest was performed following the standard protocol without IV contrast. COMPARISON:  Chest radiograph performed earlier today at 7:04 p.m. FINDINGS: Cardiovascular: Diffuse coronary artery calcifications are seen. The heart remains normal in size. The left PICC is noted ending about the proximal right atrium. Scattered calcification is seen along the thoracic aorta and proximal great vessels. Mediastinum/Nodes: Visualized mediastinal nodes are borderline normal in size. Trace pericardial fluid remains within normal limits. The thyroid gland are grossly unremarkable. No axillary lymphadenopathy is seen. Lungs/Pleura: Moderate to large right and small left pleural effusions are noted, with partial consolidation of both lower lung lobes. Mild peripheral airspace opacities are noted bilaterally, possibly reflecting a mild infectious process. No pneumothorax is seen. No masses are identified. Upper Abdomen: A cholecystostomy tube is noted. The gallbladder is decompressed and not well characterized. A 2.9 cm cystic focus is noted along the anterior aspect of the liver. A calcified granuloma is  seen within the liver. The spleen is grossly  unremarkable. Trace ascites is seen tracking about the liver and spleen. The visualized portions of the pancreas, adrenal glands and kidneys are within normal limits. Nonspecific perinephric stranding is noted bilaterally. Scattered calcification is seen along the proximal abdominal aorta and its branches. Musculoskeletal: No acute osseous abnormalities are identified. Anterior bridging osteophytes are seen along the thoracic spine. The patient is status post vertebroplasty along the lower thoracic spine, at T11-L1. The visualized musculature is unremarkable in appearance. IMPRESSION: 1. Moderate to large right and small left pleural effusions, with partial consolidation of both lower lung lobes. Mild peripheral airspace opacities seen bilaterally, possibly reflecting a mild infectious process. 2. Trace ascites noted tracking about the liver and spleen. 3. Diffuse coronary artery calcifications seen. 4. Scattered aortic atherosclerosis. 5. 2.9 cm cystic focus along the anterior aspect of the liver. This is nonspecific but may reflect a cyst. Electronically Signed   By: Garald Balding M.D.   On: 10/01/2016 22:24   Dg Chest Port 1 View  Result Date: 10/03/2016 CLINICAL DATA:  Acute respiratory failure EXAM: PORTABLE CHEST 1 VIEW COMPARISON:  10/02/2016 FINDINGS: Cardiac shadow is within normal limits. Left-sided PICC line is again seen and stable. Slight increase in the degree of left basilar atelectasis is noted with small effusion. The overall inspiratory effort is poor with crowding of the vascular markings. No acute bony abnormality is seen. IMPRESSION: Slight increase in the degree of left basilar atelectasis and effusion. The overall inspiratory effort is poor which may account for some these changes. Electronically Signed   By: Inez Catalina M.D.   On: 10/03/2016 07:21   Dg Chest Port 1 View  Result Date: 10/02/2016 CLINICAL DATA:  Post right thoracentesis EXAM: PORTABLE CHEST 1 VIEW COMPARISON:   10/02/2016 FINDINGS: Cardiomediastinal silhouette is stable. Stable endotracheal tube position. Left arm PICC line is unchanged in position. Improvement in aeration in right base with resolved right pleural effusion. Minimal residual right basilar atelectasis. Persistent small left pleural effusion with left basilar atelectasis or infiltrate. No pulmonary edema. There is no pneumothorax. Cholecystostomy catheter in right upper quadrant is stable in position. IMPRESSION: Stable endotracheal tube position. Left arm PICC line is unchanged in position. Improvement in aeration in right base with resolved right pleural effusion. Minimal residual right basilar atelectasis. Persistent small left pleural effusion with left basilar atelectasis or infiltrate. No pulmonary edema. There is no pneumothorax. Electronically Signed   By: Lahoma Crocker M.D.   On: 10/02/2016 15:06   Dg Chest Port 1 View  Result Date: 10/02/2016 CLINICAL DATA:  Pneumonia EXAM: PORTABLE CHEST 1 VIEW COMPARISON:  10/01/2016 FINDINGS: Cardiac shadow is stable. Endotracheal tube and left-sided PICC line are noted and stable. Bilateral pleural effusions are noted right greater than left with associated consolidation. The overall appearance is stable. Cholecystostomy tube is noted in the right upper quadrant. No bony abnormality is noted. IMPRESSION: Stable appearance of the chest with bilateral effusions and bibasilar consolidation. Electronically Signed   By: Inez Catalina M.D.   On: 10/02/2016 07:32    Assessment: 83 y.o. male with medium-sized subacute nonhemorrhagic left parietal lobe ischemic infarction 1. Exam reveals dense receptive and expressive aphasia, right sided weakness and right sided sensory deficit. 2. PAF. Embolus from cardiac source is the most likely etiology for the patient's stroke.  3. Echocardiogram from 3/15 revealed mild LVH, normal systolic function, no regional wall motion abnormalities and grade 1 diastolic dysfunction. No  thrombus was noted.  4. Dementia. CT head also reveals prominent cerebral atrophy.  Plan: 1. HgbA1c, fasting lipid panel 2. If able to tolerate, obtain MRI and MRA of the brain without contrast 3. PT consult, OT consult, Speech consult 4. Carotid dopplers 5. Anticoagulation is indicated for secondary stroke prevention given PAF. If falls risk or bleeding are concerns, next best option would be to start the patient on ASA.  6. Given age, statin risks likely outweigh potential benefits.  7. Telemetry monitoring 8. Frequent neuro checks 9. BP management. Out of permissive HTN time window 10. Consider a trial of Aricept  @Electronically  signed: Dr. Kerney Elbe 10/03/2016, 8:39 PM

## 2016-10-04 LAB — RETICULOCYTES
RBC.: 3.09 MIL/uL — AB (ref 4.22–5.81)
RETIC COUNT ABSOLUTE: 68 10*3/uL (ref 19.0–186.0)
RETIC CT PCT: 2.2 % (ref 0.4–3.1)

## 2016-10-04 LAB — GLUCOSE, CAPILLARY
GLUCOSE-CAPILLARY: 108 mg/dL — AB (ref 65–99)
GLUCOSE-CAPILLARY: 142 mg/dL — AB (ref 65–99)
GLUCOSE-CAPILLARY: 151 mg/dL — AB (ref 65–99)
GLUCOSE-CAPILLARY: 157 mg/dL — AB (ref 65–99)
Glucose-Capillary: 136 mg/dL — ABNORMAL HIGH (ref 65–99)
Glucose-Capillary: 173 mg/dL — ABNORMAL HIGH (ref 65–99)
Glucose-Capillary: 186 mg/dL — ABNORMAL HIGH (ref 65–99)

## 2016-10-04 LAB — CBC
HCT: 31.6 % — ABNORMAL LOW (ref 39.0–52.0)
Hemoglobin: 10 g/dL — ABNORMAL LOW (ref 13.0–17.0)
MCH: 32.4 pg (ref 26.0–34.0)
MCHC: 31.6 g/dL (ref 30.0–36.0)
MCV: 102.3 fL — ABNORMAL HIGH (ref 78.0–100.0)
Platelets: 405 10*3/uL — ABNORMAL HIGH (ref 150–400)
RBC: 3.09 MIL/uL — ABNORMAL LOW (ref 4.22–5.81)
RDW: 13.6 % (ref 11.5–15.5)
WBC: 9.5 10*3/uL (ref 4.0–10.5)

## 2016-10-04 LAB — COMPREHENSIVE METABOLIC PANEL
ALBUMIN: 1.7 g/dL — AB (ref 3.5–5.0)
ALT: 15 U/L — ABNORMAL LOW (ref 17–63)
ANION GAP: 7 (ref 5–15)
AST: 20 U/L (ref 15–41)
Alkaline Phosphatase: 67 U/L (ref 38–126)
BILIRUBIN TOTAL: 0.5 mg/dL (ref 0.3–1.2)
BUN: <5 mg/dL — ABNORMAL LOW (ref 6–20)
CO2: 31 mmol/L (ref 22–32)
Calcium: 8.3 mg/dL — ABNORMAL LOW (ref 8.9–10.3)
Chloride: 96 mmol/L — ABNORMAL LOW (ref 101–111)
Creatinine, Ser: 0.37 mg/dL — ABNORMAL LOW (ref 0.61–1.24)
GFR calc Af Amer: >60 mL/min (ref >60)
Glucose, Bld: 130 mg/dL — ABNORMAL HIGH (ref 65–99)
POTASSIUM: 3.6 mmol/L (ref 3.5–5.1)
Sodium: 134 mmol/L — ABNORMAL LOW (ref 135–145)
TOTAL PROTEIN: 5.6 g/dL — AB (ref 6.5–8.1)

## 2016-10-04 LAB — CULTURE, BLOOD (ROUTINE X 2)
CULTURE: NO GROWTH
Culture: NO GROWTH

## 2016-10-04 LAB — IRON AND TIBC
Iron: 44 ug/dL — ABNORMAL LOW (ref 45–182)
Saturation Ratios: 29 % (ref 17.9–39.5)
TIBC: 150 ug/dL — ABNORMAL LOW (ref 250–450)
UIBC: 106 ug/dL

## 2016-10-04 LAB — FOLATE: FOLATE: 17.3 ng/mL (ref >5.9)

## 2016-10-04 LAB — FERRITIN: Ferritin: 323 ng/mL (ref 24–336)

## 2016-10-04 LAB — VITAMIN B12: VITAMIN B 12: 1598 pg/mL — AB (ref 180–914)

## 2016-10-04 MED ORDER — WARFARIN SODIUM 2.5 MG PO TABS
2.5000 mg | ORAL_TABLET | Freq: Once | ORAL | Status: AC
  Administered 2016-10-04: 2.5 mg via ORAL
  Filled 2016-10-04: qty 1

## 2016-10-04 MED ORDER — WARFARIN - PHARMACIST DOSING INPATIENT
Freq: Every day | Status: DC
  Administered 2016-10-06: 17:00:00

## 2016-10-04 MED ORDER — HEPARIN (PORCINE) IN NACL 100-0.45 UNIT/ML-% IJ SOLN
1200.0000 [IU]/h | INTRAMUSCULAR | Status: DC
  Administered 2016-10-04: 750 [IU]/h via INTRAVENOUS
  Administered 2016-10-05: 850 [IU]/h via INTRAVENOUS
  Administered 2016-10-06 – 2016-10-08 (×3): 1200 [IU]/h via INTRAVENOUS
  Filled 2016-10-04 (×5): qty 250

## 2016-10-04 MED ORDER — VANCOMYCIN HCL IN DEXTROSE 1-5 GM/200ML-% IV SOLN
1000.0000 mg | Freq: Two times a day (BID) | INTRAVENOUS | Status: DC
  Administered 2016-10-04 – 2016-10-06 (×6): 1000 mg via INTRAVENOUS
  Filled 2016-10-04 (×7): qty 200

## 2016-10-04 NOTE — Progress Notes (Signed)
Pharmacy Antibiotic Note  Stephen Bates is a 83 y.o. male admitted on 09/29/2016 from [facility] ED with HCAP. (Noted pt on Zosyn from 3/14-3/20 at [facility] for acute cholecystitis - d/c 3/21). Pharmacy consulted for vanc/ceftaz dosing.  Today is day 6 of therapy. Tmax is down to 98.7 and wbc is wnl. Culture from gallbladder is growing rare MRSE. Vanc was discontinued yesterday, but MD wishes to restart and continue ceftazidime.   Plan: Continue Ceftazidime 2gm IV q8h Restart vancomycin 1g IV q12h Will f/u micro data, renal function, and pt's clinical condition   Height: 5\' 5"  (165.1 cm) Weight: 140 lb 4.8 oz (63.6 kg) IBW/kg (Calculated) : 61.5  Temp (24hrs), Avg:98.4 F (36.9 C), Min:98.1 F (36.7 C), Max:98.7 F (37.1 C)   Recent Labs Lab 09/29/16 1500 09/29/16 1741  09/30/16 0322  09/30/16 1713 10/01/16 0444 10/01/16 2335 10/02/16 0300 10/02/16 1630 10/03/16 0338 10/04/16 0514  WBC 10.4  --   < >  --   --  13.5* 11.4*  --  13.7*  --  10.8* 9.5  CREATININE 0.61  --   < >  --   < > 0.51* 0.48*  --  0.36*  --  0.36* 0.37*  LATICACIDVEN 0.9 2.8*  --  2.6*  --   --   --  2.3*  --   --   --   --   VANCOTROUGH  --   --   --   --   --   --   --   --   --  8*  --   --   < > = values in this interval not displayed.  Estimated Creatinine Clearance: 56.6 mL/min (A) (by C-G formula based on SCr of 0.37 mg/dL (L)).    No Known Allergies   3/29 VT 8>>inc to 1g q12   3/26 Zosyn x 1 3/26 Vanc >> 3/30 3/27 Fortaz >>  3/26 BCx x2: ngtd 3/26 Trach asp: ngtd 3/27 gallbladder fluid: rare MRSE  Dierdre Harness, [place], PharmD Clinical Pharmacy Resident [phone] (Pager) 10/04/2016 9:01 AM

## 2016-10-04 NOTE — Progress Notes (Signed)
STROKE TEAM PROGRESS NOTE   HISTORY OF PRESENT ILLNESS (per record) Stephen Bates is an 83 y.o. male with multiple medical problems who was admitted on 3/27 after being found unresponsive at home. He was diagnosed with septic shock and acute hypoxic respiratory failure secondary to HCAP. He was intubated and initially managed in the ICU. Hospital problems have included newly diagnosed PAF and acute encephalopathy on his baseline of dementia. His condition improved, he was extubated and transferred to [address] [place]. A CT head was obtained, revealing a subacute medium sized left parietal lobe ischemic infarction. Neurology was consulted for further recommendations.     SUBJECTIVE (INTERVAL HISTORY) His family  Is not  at the bedside.  He has no complaints but is aphasic  OBJECTIVE Temp:  [98.2 F (36.8 C)-98.7 F (37.1 C)] 98.2 F (36.8 C) (03/31 0458) Pulse Rate:  [72-86] 72 (03/31 0458) Cardiac Rhythm: Heart block;Bundle branch block (03/31 0748) Resp:  [17-22] 17 (03/31 0458) BP: (109-128)/(49-58) 120/50 (03/31 0458) SpO2:  [97 %-100 %] 97 % (03/31 0458) Weight:  [63.1 kg (139 lb 1.6 oz)-63.6 kg (140 lb 4.8 oz)] 63.6 kg (140 lb 4.8 oz) (03/31 0458)  CBC:  Recent Labs Lab 09/29/16 1500  10/03/16 0338 10/04/16 0514  WBC 10.4  < > 10.8* 9.5  NEUTROABS 8.8*  --   --   --   HGB 11.5*  < > 9.8* 10.0*  HCT 35.7*  < > 31.2* 31.6*  MCV 105.0*  < > 101.6* 102.3*  PLT 687*  < > 403* 405*  < > = values in this interval not displayed.  Basic Metabolic Panel:  Recent Labs Lab 10/01/16 1709  10/03/16 0338 10/04/16 0514  NA  --   < > 136 134*  K  --   < > 3.0* 3.6  CL  --   < > 95* 96*  CO2  --   < > 35* 31  GLUCOSE  --   < > 108* 130*  BUN  --   < > 5* <5*  CREATININE  --   < > 0.36* 0.37*  CALCIUM  --   < > 7.9* 8.3*  MG 1.9  --  1.7  --   PHOS 1.8*  --  2.6  --   < > = values in this interval not displayed.  Lipid Panel: No results found for: CHOL, TRIG, HDL, CHOLHDL, VLDL,  LDLCALC HgbA1c:  Lab Results  Component Value Date   HGBA1C 6.1 (H) 08/24/2015   Urine Drug Screen: No results found for: LABOPIA, COCAINSCRNUR, LABBENZ, AMPHETMU, THCU, LABBARB    IMAGING  Ct Head Wo Contrast 10/03/2016 1. Acute versus subacute nonhemorrhagic left parietal lobe (left MCA territory) infarct.     Dg Chest Port 1 View 10/03/2016 Slight increase in the degree of left basilar atelectasis and effusion. The overall inspiratory effort is poor which may account for some these changes.      Dg Chest Port 1 View 10/02/2016 Stable endotracheal tube position. Left arm PICC line is unchanged in position. Improvement in aeration in right base with resolved right pleural effusion. Minimal residual right basilar atelectasis. Persistent small left pleural effusion with left basilar atelectasis or infiltrate. No pulmonary edema. There is no pneumothorax.     PHYSICAL EXAM Obese elderly caucasian male not in distress. . Afebrile. Head is nontraumatic. Neck is supple without bruit.    Cardiac exam no murmur or gallop. Lungs are clear to auscultation. Distal pulses are well felt.  Neurological Exam :  Awake alert globally aphasic with receptive more than expressive aphasia. Speech at times nonsensical and difficult to understand. He does not follow commands except for occasional midline commands only. He blinks to threat on the left but not on the right side. Right lower facial weakness. Tongue midline. Eye movements appear to be full range. Tongue midline. Motor system exam right hemiparesis. Withdraws left-sided against gravity. Deep tendon reflexes are depressed on the right and 1+ on the left. Right plantar upgoing left downgoing.   ASSESSMENT/PLAN [name] is a 83 y.o. male with history of previous stroke, assay cancer, dementia, hypertension, diabetes, frequent falls, recent cholecystitis, and newly diagnosed PAF presenting with unresponsiveness, septic shock,  respiratory failure and pneumonia. He did not receive IV t-PA due to unknown time of onset.  Stroke: Acute versus subacute left MCA territory infarct - embolic 2/2 atrial fibrillation.  Resultant   global aphasia and right hemiparesis   MRI - not performed  MRA - not performed  Carotid Doppler - not performed.  2D Echo - EF 60 - 65 %. No cardiac source of emboli identified.  LDL - not performed  HgbA1c - not performed  VTE prophylaxis - IV Heparin / warfarin / SCDs Diet NPO time specified  aspirin 81 mg daily prior to admission, now on heparin IV and warfarin daily  Patient counseled to be compliant with his antithrombotic medications  Ongoing aggressive stroke risk factor management  Therapy recommendations: SNF recommended  Disposition: Pending  Hypertension  Stable  Permissive hypertension (OK if < 220/120) but gradually normalize in 5-7 days  Long-term BP goal normotensive  Hyperlipidemia  Home meds: no lipid lowering medications PTA.  LDL not performed, goal < 70  Continue statin at discharge  Diabetes  HgbA1c not performed, goal < 7.0  Unc / Controlled  Other Stroke Risk Factors  Advanced age  The patient quit smoking 47 years ago.  ETOH use - 1.8 ozs per week.  Hx stroke/TIA   Other Active Problems  Recent cholecystitis  - Vancomycin / Fortaz  Pneumonia  Anemia - 10.0 / 31.6  Na 134  DNR  Hospital day # 5  I have personally examined this patient, reviewed notes, independently viewed imaging studies, participated in medical decision making and plan of care.ROS completed by me personally and pertinent positives fully documented  I have made any additions or clarifications directly to the above note. He presented with aphasia and right hemiparesis due to embolic left MCA infarct from atrial fibrillation. His prognosis is guarded. No family available for discussion. Greater than 50% time during this 25 minute visit was spent on  coordination of care for his embolic stroke. And discussion with Dr. Judie Bonus, MD Medical Director [place] Pager: [phone] 10/04/2016 4:53 PM   To contact Stroke Continuity provider, please refer to [email or web address] After hours, contact General Neurology

## 2016-10-04 NOTE — Progress Notes (Signed)
  Speech Language Pathology Treatment: Dysphagia  Patient Details Name: Stephen Bates MRN: [id number] DOB: December 22, 1926 Today's Date: 10/04/2016 Time: 4268-3419 SLP Time Calculation (min) (ACUTE ONLY): 13 min  Assessment / Plan / Recommendation Clinical Impression  Patient seen for dysphagia treatment, no family present in room. Pt responds verbally to SLP greeting, other spontaneous vocalizations inappropriate, does not follow verbal commands, follows visual commands inconsistently. Initially declines all PO; SLP presented ice chip via teaspoon, pt begins to manipulate, states, "No". Suspect delayed swallow initiation, vocal quality is wet suggestive of reduced airway protection. Declines additional teaspoon or cup sip of thin liquid. Accepts 2 boluses pureed texture via teaspoon, noted with oral holding, prolonged oral manipulation, suspect delayed swallow initiation. Immediate cough suggestive of reduced airway protection. Pt accepts single sip of thin liquid via straw, again with suspected delayed initiation, wet vocal quality. Recommend continuing NPO, with oral care QID. Pt with no nutritional support; may benefit from palliative consult to clarify goals of care. SLP will continue to follow.     HPI HPI: 83 year old male with PMH significant for DM, bedbound, dementia, prostate Ca, and CVA. He was also recently admitted 3/14 >3/21 to [facility] for cholecystitis treated with IV abx and cystostomy placement. Hospital course complicated by PAF, which resolved spontaneously. He was discharged to home with home health nursing. 3/26 he presented to ED after being found unresponsive at home. He remained unresponsive in ED and was intubated for airway protection. He was transferred to [facility] for ICU care.  ETT 3/26-3/29.  Dx acute hypoxic resp failure in setting of HCAP, septic shock - resolved.  Changes in behavior with weakness, gaze preference; CT 3/30 Acute versus subacute nonhemorrhagic left  parietal lobe (left MCA      SLP Plan  Continue with current plan of care       Recommendations  Diet recommendations: NPO                Oral Care Recommendations: Oral care QID Follow up Recommendations: Other (comment) (tbd) SLP Visit Diagnosis: Dysphagia, unspecified (R13.10) Plan: Continue with current plan of care       [place], MS CF-SLP Speech-Language Pathologist [phone]  Aliene Altes 10/04/2016, 9:21 AM

## 2016-10-04 NOTE — Progress Notes (Addendum)
Stephen Bates  NIO:[id number] DOB: 09/03/1926 DOA: 09/29/2016 PCP: Asencion Noble, MD    Brief Narrative:  83 year old male with hx of DM, dementia, prostate CA, and CVA who is bedbound. He was admitted 3/14 > 3/21 to [facility] for cholecystitis treated with IV antibiotics and cystostomy placement. Hospital course complicated by PAF, which resolved spontaneously. He was discharged to home with home health nursing. 3/26 he returned to the ED after being found unresponsive at home. He remained unresponsive in ED and was intubated for airway protection. He required norepinephrine for shock. CXR was concerning for multifocal PNA. He was transferred to [facility] for ICU care. He was transferred to hospitalist service on 10/03/2016 , Unfortunately on imaging he was found to have acute CVA on 09/16/2016.  Has been transferred under my care on 10/04/2016  Significant Events: 3/27 admitted w/ septic shock d/t HCAP and on-going cholecystitis. Got additional volume + 3.3 liters. Weaned pressors to off. Looked at right chest for thora. Agitation and body habitus made window for safe sampling small. Decided to hold off.  3/28 weaned sedating gtts to off. Weaning on PSV.  3/30 [facility] assumed care   Subjective:  Patient remains in bed still quite confused, unreliable historian, does not appear to be in any distress.   Assessment & Plan:  Septic shock with Acute Hypoxic respiratory failure due to HCAP w/ R>L effusions Improving clinically - cont empiric abx - follow effusions w/ serial CXRs - wean O2 as able, note currently he is on vancomycin and Fortaz, repeat chest x-ray on 10/05/2016 and readdress if antibiotics need to be continued.  Recent acute cholecystitis s/p perc drain placed 3/19 Perc drain to remain - begin any fluid growing coag-negative staph, currently on combination of vancomycin and Fortaz. Pharmacy requested to continue vancomycin and Fortaz for 10/04/2016. Now afebrile, no leukocytosis.  No antibiotics needed from this standpoint now. Outpatient general surgery follow-up.  Newly appreciated PAF in and out of NSR - follow on tele   Acute CVA.  Has expressive aphasia along with mild right-sided weakness, we will currently placed on heparin drip along with Coumadin overlap, which is ongoing surgical problems Coumadin probably will be the best option as easily reversible, neuro on board, stroke workup per neuro. Recent echo from few weeks ago appears nonacute.  Dysphagia. Due to stroke. Speech following. Currently nothing by mouth with gentle hydration.   Hypokalemia Replace and follow - Mg is normal   Chronic anemia w/out s/sx bleeding Hgb stable - follow   DM  CBG currently well controlled  CBG (last 3)   Recent Labs  10/04/16 0455 10/04/16 0814 10/04/16 1155  GLUCAP 108* 142* 186*   Lab Results  Component Value Date   HGBA1C 6.1 (H) 08/24/2015    Acute encephalopathy -  mild chronic dementia, Acute CVA Minimize sedating meds - follow clinically - Stable G18 and folic acid levels.    Due to advanced age, now stroke, sepsis, dysphagia, aphasia. Will involve palliative care for goals of care. Currently DO NOT RESUSCITATE.    DVT prophylaxis: Hep/Coumadin  Code Status: DNR - NO CODE Family Communication: no family present at time of exam  Disposition Plan: transfer to tele bed - begin PT/OT    Consultants:  PCCM, Neuro  Anti-infectives    Start     Dose/Rate Route Frequency Ordered Stop   10/03/16 0800  vancomycin (VANCOCIN) IVPB 1000 mg/200 mL premix  Status:  Discontinued  1,000 mg 200 mL/hr over 60 Minutes Intravenous Every 12 hours 10/02/16 1834 10/03/16 0958   10/02/16 2000  vancomycin (VANCOCIN) 500 mg in sodium chloride 0.9 % 100 mL IVPB     500 mg 100 mL/hr over 60 Minutes Intravenous  Once 10/02/16 1834 10/02/16 2133   10/02/16 1700  cefTAZidime (FORTAZ) 2 g in dextrose 5 % 50 mL IVPB     2 g 100 mL/hr over 30 Minutes Intravenous  Every 8 hours 10/02/16 1207     09/30/16 0500  vancomycin (VANCOCIN) 500 mg in sodium chloride 0.9 % 100 mL IVPB  Status:  Discontinued     500 mg 100 mL/hr over 60 Minutes Intravenous Every 12 hours 09/30/16 0112 10/02/16 1834   09/30/16 0130  cefTAZidime (FORTAZ) 2 g in dextrose 5 % 50 mL IVPB  Status:  Discontinued     2 g 100 mL/hr over 30 Minutes Intravenous Every 12 hours 09/30/16 0112 10/02/16 1207   09/29/16 1530  piperacillin-tazobactam (ZOSYN) IVPB 3.375 g     3.375 g 100 mL/hr over 30 Minutes Intravenous  Once 09/29/16 1521 09/29/16 1652   09/29/16 1530  vancomycin (VANCOCIN) IVPB 1000 mg/200 mL premix     1,000 mg 200 mL/hr over 60 Minutes Intravenous  Once 09/29/16 1521 09/29/16 1735      Objective: Blood pressure (!) 120/50, pulse 72, temperature 98.2 F (36.8 C), temperature source Axillary, resp. rate 17, height 5\' 5"  (1.651 m), weight 63.6 kg (140 lb 4.8 oz), SpO2 97 %.  Intake/Output Summary (Last 24 hours) at 10/04/16 0828 Last data filed at 10/04/16 7253  Gross per 24 hour  Intake          1165.33 ml  Output              550 ml  Net           615.33 ml   Filed Weights   10/03/16 0500 10/03/16 1357 10/04/16 0458  Weight: 62.6 kg (138 lb 0.1 oz) 63.1 kg (139 lb 1.6 oz) 63.6 kg (140 lb 4.8 oz)    Examination: General: No acute respiratory distress at rest but requiring 4L [place] O2 Lungs: poor air movement B bases - no wheezing  Cardiovascular: Regular rate and rhythm without murmur  Abdomen: Nontender, moderately distended, soft, bowel sounds hypoactive, no rebound, no ascites, no appreciable mass Extremities: No significant cyanosis, clubbing, or edema bilateral lower extremities Neuro - does have expressive aphasia hard to examine due to delirium, mild right-sided weakness,  CBC:  Recent Labs Lab 09/29/16 1500  09/30/16 1713 10/01/16 0444 10/02/16 0300 10/03/16 0338 10/04/16 0514  WBC 10.4  < > 13.5* 11.4* 13.7* 10.8* 9.5  NEUTROABS 8.8*  --   --    --   --   --   --   HGB 11.5*  < > 10.6* 10.3* 10.3* 9.8* 10.0*  HCT 35.7*  < > 33.9* 33.5* 32.0* 31.2* 31.6*  MCV 105.0*  < > 104.3* 104.0* 101.6* 101.6* 102.3*  PLT 687*  < > 561* 508* 461* 403* 405*  < > = values in this interval not displayed. Basic Metabolic Panel:  Recent Labs Lab 09/30/16 1054 09/30/16 1713 10/01/16 0444 10/01/16 1709 10/02/16 0300 10/03/16 0338 10/04/16 0514  NA 133*  --  134*  --  136 136 134*  K 4.0  --  4.0  --  3.0* 3.0* 3.6  CL 103  --  102  --  97* 95* 96*  CO2 23  --  27  --  34* 35* 31  GLUCOSE 152*  --  205*  --  51* 108* 130*  BUN 6  --  5*  --  5* 5* <5*  CREATININE 0.52* 0.51* 0.48*  --  0.36* 0.36* 0.37*  CALCIUM 7.8*  --  7.9*  --  7.8* 7.9* 8.3*  MG 2.1 2.2 1.9 1.9  --  1.7  --   PHOS 2.8 2.2* 2.2* 1.8*  --  2.6  --    GFR: Estimated Creatinine Clearance: 56.6 mL/min (A) (by C-G formula based on SCr of 0.37 mg/dL (L)).  Liver Function Tests:  Recent Labs Lab 09/29/16 1500 10/01/16 0444 10/02/16 0300 10/04/16 0514  AST 16 19 21 20   ALT 18 14* 13* 15*  ALKPHOS 61 55 57 67  BILITOT 1.9* 0.5 0.5 0.5  PROT 6.4* 5.2* 5.2* 5.6*  ALBUMIN 2.4* 1.7* 1.7* 1.7*    Recent Labs Lab 09/29/16 1520  AMMONIA 34    Cardiac Enzymes:  Recent Labs Lab 09/29/16 1726  TROPONINI 0.07*    HbA1C: Hgb A1c MFr Bld  Date/Time Value Ref Range Status  08/24/2015 06:51 AM 6.1 (H) 4.8 - 5.6 % Final    Comment:    (NOTE)         Pre-diabetes: 5.7 - 6.4         Diabetes: >6.4         Glycemic control for adults with diabetes: <7.0   05/22/2015 02:22 PM 6.3 (H) 4.8 - 5.6 % Final    Comment:    (NOTE)         Pre-diabetes: 5.7 - 6.4         Diabetes: >6.4         Glycemic control for adults with diabetes: <7.0     CBG:  Recent Labs Lab 10/03/16 1749 10/03/16 2131 10/04/16 0017 10/04/16 0455 10/04/16 0814  GLUCAP 117* 131* 136* 108* 142*    Recent Results (from the past 240 hour(s))  Blood culture (routine x 2)     Status:  None (Preliminary result)   Collection Time: 09/29/16  3:20 PM  Result Value Ref Range Status   Specimen Description BLOOD RIGHT ARM  Final   Special Requests BOTTLES DRAWN AEROBIC AND ANAEROBIC 6CC  Final   Culture NO GROWTH 4 DAYS  Final   Report Status PENDING  Incomplete  Blood culture (routine x 2)     Status: None (Preliminary result)   Collection Time: 09/29/16  3:20 PM  Result Value Ref Range Status   Specimen Description BLOOD RIGHT WRIST  Final   Special Requests BOTTLES DRAWN AEROBIC AND ANAEROBIC 6CC  Final   Culture NO GROWTH 4 DAYS  Final   Report Status PENDING  Incomplete  Culture, respiratory (NON-Expectorated)     Status: None   Collection Time: 09/29/16  5:28 PM  Result Value Ref Range Status   Specimen Description TRACHEAL ASPIRATE  Final   Special Requests Normal  Final   Gram Stain   Final    MODERATE WBC PRESENT,BOTH PMN AND MONONUCLEAR NO ORGANISMS SEEN    Culture   Final    Consistent with normal respiratory flora. Performed at [facility], [place] [address] [place], [place] [address]    Report Status 10/02/2016 FINAL  Final  MRSA PCR Screening     Status: None   Collection Time: 09/30/16  1:11 AM  Result Value Ref Range Status   MRSA by PCR NEGATIVE NEGATIVE Final  Comment:        The GeneXpert MRSA Assay (FDA approved for NASAL specimens only), is one component of a comprehensive MRSA colonization surveillance program. It is not intended to diagnose MRSA infection nor to guide or monitor treatment for MRSA infections.   Body fluid culture     Status: None   Collection Time: 09/30/16  2:40 AM  Result Value Ref Range Status   Specimen Description GALL BLADDER  Final   Special Requests NONE  Final   Gram Stain NO WBC SEEN RARE GRAM POSITIVE COCCI IN PAIRS   Final   Culture RARE STAPHYLOCOCCUS SPECIES (COAGULASE NEGATIVE)  Final   Report Status 10/03/2016 FINAL  Final   Organism ID, Bacteria STAPHYLOCOCCUS SPECIES (COAGULASE NEGATIVE)   Final      Susceptibility   Staphylococcus species (coagulase negative) - MIC*    CIPROFLOXACIN >=8 RESISTANT Resistant     ERYTHROMYCIN >=8 RESISTANT Resistant     GENTAMICIN <=0.5 SENSITIVE Sensitive     OXACILLIN >=4 RESISTANT Resistant     TETRACYCLINE 2 SENSITIVE Sensitive     VANCOMYCIN 1 SENSITIVE Sensitive     TRIMETH/SULFA 80 RESISTANT Resistant     CLINDAMYCIN >=8 RESISTANT Resistant     RIFAMPIN <=0.5 SENSITIVE Sensitive     Inducible Clindamycin NEGATIVE Sensitive     * RARE STAPHYLOCOCCUS SPECIES (COAGULASE NEGATIVE)  Body fluid culture     Status: None (Preliminary result)   Collection Time: 10/02/16  2:23 PM  Result Value Ref Range Status   Specimen Description FLUID RIGHT PLEURAL  Final   Special Requests NONE  Final   Gram Stain   Final    ABUNDANT WBC PRESENT,BOTH PMN AND MONONUCLEAR NO ORGANISMS SEEN    Culture NO GROWTH < 24 HOURS  Final   Report Status PENDING  Incomplete     Scheduled Meds: . cefTAZidime (FORTAZ)  IV  2 g Intravenous Q8H  . Chlorhexidine Gluconate Cloth  6 each Topical Daily  . docusate sodium  100 mg Oral BID  . heparin  5,000 Units Subcutaneous Q8H  . insulin aspart  0-9 Units Subcutaneous Q4H  . mouth rinse  15 mL Mouth Rinse BID  . mirtazapine  30 mg Oral QHS  . potassium chloride  20 mEq Oral BID  . sodium chloride flush  10-40 mL Intracatheter Q12H   Continuous Infusions: . dextrose 40 mL/hr at 10/04/16 0500     LOS: 5 days    Signature  Thurnell Lose M.D on 10/04/2016 at 8:28 AM  Between 7am to 7pm - Pager - [phone] ( page via [facility], text pages only, please mention full 10 digit call back number).  After 7pm go to [email or web address] - password [facility]

## 2016-10-04 NOTE — Progress Notes (Signed)
ANTICOAGULATION CONSULT NOTE - Initial Consult  Pharmacy Consult for Heparin and warfarin  Indication: atrial fibrillation and acute CVA  No Known Allergies  Patient Measurements: Height: 5\' 5"  (165.1 cm) Weight: 140 lb 4.8 oz (63.6 kg) IBW/kg (Calculated) : 61.5   Vital Signs: Temp: 98.2 F (36.8 C) (03/31 0458) Temp Source: Axillary (03/31 0458) BP: 120/50 (03/31 0458) Pulse Rate: 72 (03/31 0458)  Labs:  Recent Labs  10/02/16 0300 10/03/16 0338 10/04/16 0514  HGB 10.3* 9.8* 10.0*  HCT 32.0* 31.2* 31.6*  PLT 461* 403* 405*  CREATININE 0.36* 0.36* 0.37*    Estimated Creatinine Clearance: 56.6 mL/min (A) (by C-G formula based on SCr of 0.37 mg/dL (L)).   Medical History: Past Medical History:  Diagnosis Date  . Arthritis   . Benign neoplasm of colon   . Chronic back pain   . Diabetes ([place])   . Frequent falls 2012  . History of right bundle branch block   . Hypertension   . Lumbar radiculopathy   . Mild dementia   . Neuropathy ([place])   . Prostate cancer ([place])   . Stroke ([place]) 1982  . Weakness of both legs   . Wears glasses     Assessment: 83 yo male admitted and found to have atrial fibrillation and acute CVA. CT with no evidence of bleed. Pharmacy asked to initiate heparin and warfarin.   Goal of Therapy:  Heparin level goal 0.3-0.5 Monitor platelets by anticoagulation protocol: Yes   Plan:  1. Begin heparin infusion at 750 units/hr 2. Heparin level 8 hours after starting infusion and daily 3. Warfarin 2.5 mg x 1 this evening  4. Daily INR  Vincenza Hews, PharmD, BCPS 10/04/2016, 1:07 PM

## 2016-10-05 DIAGNOSIS — Z515 Encounter for palliative care: Secondary | ICD-10-CM

## 2016-10-05 DIAGNOSIS — E138 Other specified diabetes mellitus with unspecified complications: Secondary | ICD-10-CM

## 2016-10-05 DIAGNOSIS — I639 Cerebral infarction, unspecified: Secondary | ICD-10-CM

## 2016-10-05 LAB — BASIC METABOLIC PANEL
Anion gap: 5 (ref 5–15)
CALCIUM: 8.4 mg/dL — AB (ref 8.9–10.3)
CO2: 33 mmol/L — ABNORMAL HIGH (ref 22–32)
CREATININE: 0.35 mg/dL — AB (ref 0.61–1.24)
Chloride: 94 mmol/L — ABNORMAL LOW (ref 101–111)
GFR calc Af Amer: >60 mL/min (ref >60)
GLUCOSE: 146 mg/dL — AB (ref 65–99)
Potassium: 2.9 mmol/L — ABNORMAL LOW (ref 3.5–5.1)
SODIUM: 132 mmol/L — AB (ref 135–145)

## 2016-10-05 LAB — CBC
HEMATOCRIT: 30.9 % — AB (ref 39.0–52.0)
Hemoglobin: 9.9 g/dL — ABNORMAL LOW (ref 13.0–17.0)
MCH: 32.2 pg (ref 26.0–34.0)
MCHC: 32 g/dL (ref 30.0–36.0)
MCV: 100.7 fL — ABNORMAL HIGH (ref 78.0–100.0)
PLATELETS: 383 10*3/uL (ref 150–400)
RBC: 3.07 MIL/uL — ABNORMAL LOW (ref 4.22–5.81)
RDW: 13.4 % (ref 11.5–15.5)
WBC: 9.7 10*3/uL (ref 4.0–10.5)

## 2016-10-05 LAB — GLUCOSE, CAPILLARY
GLUCOSE-CAPILLARY: 183 mg/dL — AB (ref 65–99)
Glucose-Capillary: 131 mg/dL — ABNORMAL HIGH (ref 65–99)
Glucose-Capillary: 146 mg/dL — ABNORMAL HIGH (ref 65–99)
Glucose-Capillary: 213 mg/dL — ABNORMAL HIGH (ref 65–99)
Glucose-Capillary: 152 mg/dL — ABNORMAL HIGH (ref 65–99)

## 2016-10-05 LAB — PROTIME-INR
INR: 1.19
PROTHROMBIN TIME: 15.2 s (ref 11.4–15.2)

## 2016-10-05 LAB — MAGNESIUM: Magnesium: 1.8 mg/dL (ref 1.7–2.4)

## 2016-10-05 LAB — HEPARIN LEVEL (UNFRACTIONATED)
HEPARIN UNFRACTIONATED: 0.24 [IU]/mL — AB (ref 0.30–0.70)
HEPARIN UNFRACTIONATED: 0.19 [IU]/mL — AB (ref 0.30–0.70)
Heparin Unfractionated: 0.28 IU/mL — ABNORMAL LOW (ref 0.30–0.70)

## 2016-10-05 MED ORDER — WARFARIN SODIUM 2.5 MG PO TABS
2.5000 mg | ORAL_TABLET | Freq: Once | ORAL | Status: AC
  Administered 2016-10-05: 2.5 mg via ORAL
  Filled 2016-10-05: qty 1

## 2016-10-05 MED ORDER — SODIUM CHLORIDE 0.9 % IV SOLN
30.0000 meq | INTRAVENOUS | Status: AC
  Administered 2016-10-05 (×2): 30 meq via INTRAVENOUS
  Filled 2016-10-05 (×3): qty 15

## 2016-10-05 MED ORDER — MAGNESIUM SULFATE 2 GM/50ML IV SOLN
2.0000 g | Freq: Once | INTRAVENOUS | Status: AC
  Administered 2016-10-05: 2 g via INTRAVENOUS
  Filled 2016-10-05: qty 50

## 2016-10-05 NOTE — Progress Notes (Signed)
ANTICOAGULATION CONSULT NOTE   Pharmacy Consult for Heparin Indication: atrial fibrillation and acute CVA  No Known Allergies  Patient Measurements: Height: 5\' 5"  (165.1 cm) Weight: 140 lb 4.8 oz (63.6 kg) IBW/kg (Calculated) : 61.5   Vital Signs: Temp: 98.6 F (37 C) (04/01 1649) Temp Source: Oral (04/01 1649) BP: 137/63 (04/01 1649) Pulse Rate: 84 (04/01 1649)  Labs:  Recent Labs  10/03/16 0338 10/04/16 0514 10/05/16 0046 10/05/16 0458 10/05/16 0934 10/05/16 1815  HGB 9.8* 10.0*  --  9.9*  --   --   HCT 31.2* 31.6*  --  30.9*  --   --   PLT 403* 405*  --  383  --   --   LABPROT  --   --   --  15.2  --   --   INR  --   --   --  1.19  --   --   HEPARINUNFRC  --   --  0.19*  --  0.28* 0.24*  CREATININE 0.36* 0.37*  --  0.35*  --   --     Estimated Creatinine Clearance: 56.6 mL/min (A) (by C-G formula based on SCr of 0.35 mg/dL (L)).   Medical History: Past Medical History:  Diagnosis Date  . Arthritis   . Benign neoplasm of colon   . Chronic back pain   . Diabetes ([place])   . Frequent falls 2012  . History of right bundle branch block   . Hypertension   . Lumbar radiculopathy   . Mild dementia   . Neuropathy ([place])   . Prostate cancer ([place])   . Stroke ([place]) 1982  . Weakness of both legs   . Wears glasses     Assessment: 83 yo male admitted and found to have atrial fibrillation and acute CVA. Heparin level slightly SUBtherapeutic, will make conservative adjustment due to low goal range.     Goal of Therapy:  Heparin level goal 0.3-0.5 Monitor platelets by anticoagulation protocol: Yes    Plan:  1. Increase heparin infusion to 950 units/hr 2. Daily HL, CBC 3. Next level with AM labs    Hughes Better, PharmD, BCPS Clinical Pharmacist 10/05/2016 7:24 PM

## 2016-10-05 NOTE — Progress Notes (Signed)
Stephen Bates  WUJ:[id number] DOB: 04/27/1927 DOA: 09/29/2016 PCP: Asencion Noble, MD    Brief Narrative:  83 year old male with hx of DM, dementia, prostate CA, and CVA who is bedbound. He was admitted 3/14 > 3/21 to [facility] for cholecystitis treated with IV antibiotics and cystostomy placement. Hospital course complicated by PAF, which resolved spontaneously. He was discharged to home with home health nursing. 3/26 he returned to the ED after being found unresponsive at home. He remained unresponsive in ED and was intubated for airway protection. He required norepinephrine for shock. CXR was concerning for multifocal PNA. He was transferred to [facility] for ICU care. He was transferred to hospitalist service on 10/03/2016  Unfortunately on imaging he was found to have acute CVA on 10/03/2016.   Significant Events: 3/27 admitted w/ septic shock d/t HCAP and on-going cholecystitis. Got additional volume + 3.3 liters. Weaned pressors to off. Looked at right chest for thora. Agitation and body habitus made window for safe sampling small. Decided to hold off.  3/28 weaned sedating gtts to off. Weaning on PSV.  3/30 [facility] assumed care - found to have CVA  Subjective:  Patient remains in bed still quite confused, unreliable historian-- says yes to all questions  Assessment & Plan:  Septic shock with Acute Hypoxic respiratory failure due to HCAP w/ R>L effusions Improving clinically - cont empiric abx - follow effusions w/ serial CXRs - wean O2 as able, note currently he is on vancomycin and Fortaz, -awaiting repeat chest x-ray on 10/05/2016 once back will readdress if antibiotics need to be continued.  Recent acute cholecystitis s/p perc drain placed 3/19 Perc drain to remain - begin any fluid growing coag-negative staph, currently on combination of vancomycin and Fortaz. -continue vancomycin and Fortaz for 10/04/2016.  - Outpatient general surgery follow-up.  Newly appreciated PAF in and  out of NSR - follow on tele   Acute CVA.  -Has expressive aphasia along with mild right-sided weakness -Coumadin probably will be the best option as easily reversible -neuro on board-Recent echo from few weeks ago appears nonacute: IV heparin to PO coumadin bridge -6.1 HgbA1C in February  Dysphagia. Due to stroke. Speech following-- will start DYS 1 with aspiration precautions-- patient currently refusing PO  Hypokalemia Replace IV -recheck in AM -added Mg to labs- replete  Chronic anemia w/out s/sx bleeding Hgb stable - follow   DM  -SSI -on D10 since thursday  Acute encephalopathy -  mild chronic dementia, Acute CVA Minimize sedating meds - follow clinically - Stable N56 and folic acid levels.    Due to advanced age, now stroke, sepsis, dysphagia, aphasia. Will involve palliative care for goals of care. Currently DO NOT RESUSCITATE.    DVT prophylaxis: Hep/Coumadin  Code Status: DNR - NO CODE Family Communication: called daughter [name]-- wife of patient on 3W-would like updates please Disposition Plan: PT/OT-- SNF-- family would prefer to take home with 24 hour care   Consultants:  PCCM, Neuro  Anti-infectives    Start     Dose/Rate Route Frequency Ordered Stop   10/04/16 1000  vancomycin (VANCOCIN) IVPB 1000 mg/200 mL premix     1,000 mg 200 mL/hr over 60 Minutes Intravenous Every 12 hours 10/04/16 0902     10/03/16 0800  vancomycin (VANCOCIN) IVPB 1000 mg/200 mL premix  Status:  Discontinued     1,000 mg 200 mL/hr over 60 Minutes Intravenous Every 12 hours 10/02/16 1834 10/03/16 0958   10/02/16 2000  vancomycin (VANCOCIN) 500 mg in sodium chloride 0.9 % 100 mL IVPB     500 mg 100 mL/hr over 60 Minutes Intravenous  Once 10/02/16 1834 10/02/16 2133   10/02/16 1700  cefTAZidime (FORTAZ) 2 g in dextrose 5 % 50 mL IVPB     2 g 100 mL/hr over 30 Minutes Intravenous Every 8 hours 10/02/16 1207     09/30/16 0500  vancomycin (VANCOCIN) 500 mg in sodium chloride 0.9  % 100 mL IVPB  Status:  Discontinued     500 mg 100 mL/hr over 60 Minutes Intravenous Every 12 hours 09/30/16 0112 10/02/16 1834   09/30/16 0130  cefTAZidime (FORTAZ) 2 g in dextrose 5 % 50 mL IVPB  Status:  Discontinued     2 g 100 mL/hr over 30 Minutes Intravenous Every 12 hours 09/30/16 0112 10/02/16 1207   09/29/16 1530  piperacillin-tazobactam (ZOSYN) IVPB 3.375 g     3.375 g 100 mL/hr over 30 Minutes Intravenous  Once 09/29/16 1521 09/29/16 1652   09/29/16 1530  vancomycin (VANCOCIN) IVPB 1000 mg/200 mL premix     1,000 mg 200 mL/hr over 60 Minutes Intravenous  Once 09/29/16 1521 09/29/16 1735      Objective: Blood pressure (!) 113/39, pulse (!) 55, temperature 97.7 F (36.5 C), temperature source Axillary, resp. rate 18, height 5\' 5"  (1.651 m), weight 63.6 kg (140 lb 4.8 oz), SpO2 94 %.  Intake/Output Summary (Last 24 hours) at 10/05/16 1011 Last data filed at 10/05/16 0532  Gross per 24 hour  Intake          1554.05 ml  Output             1475 ml  Net            79.05 ml   Filed Weights   10/03/16 1357 10/04/16 0458 10/05/16 0459  Weight: 63.1 kg (139 lb 1.6 oz) 63.6 kg (140 lb 4.8 oz) 63.6 kg (140 lb 4.8 oz)    Examination: General: appears ill- on 4L O2 Lungs: poor effort - no wheezing  Cardiovascular: Regular rate and rhythm without murmur  Abdomen: Nontender, moderately distended, soft, bowel sounds hypoactive, no rebound, no ascites, no appreciable mass Extremities: No significant cyanosis, clubbing, or edema bilateral lower extremities Neuro - does have expressive aphasia-- not able to cooperate with full exam Foley in place  CBC:  Recent Labs Lab 09/29/16 1500  10/01/16 0444 10/02/16 0300 10/03/16 0338 10/04/16 0514 10/05/16 0458  WBC 10.4  < > 11.4* 13.7* 10.8* 9.5 9.7  NEUTROABS 8.8*  --   --   --   --   --   --   HGB 11.5*  < > 10.3* 10.3* 9.8* 10.0* 9.9*  HCT 35.7*  < > 33.5* 32.0* 31.2* 31.6* 30.9*  MCV 105.0*  < > 104.0* 101.6* 101.6* 102.3*  100.7*  PLT 687*  < > 508* 461* 403* 405* 383  < > = values in this interval not displayed. Basic Metabolic Panel:  Recent Labs Lab 09/30/16 1054 09/30/16 1713 10/01/16 0444 10/01/16 1709 10/02/16 0300 10/03/16 0338 10/04/16 0514 10/05/16 0458  NA 133*  --  134*  --  136 136 134* 132*  K 4.0  --  4.0  --  3.0* 3.0* 3.6 2.9*  CL 103  --  102  --  97* 95* 96* 94*  CO2 23  --  27  --  34* 35* 31 33*  GLUCOSE 152*  --  205*  --  51*  108* 130* 146*  BUN 6  --  5*  --  5* 5* <5* <5*  CREATININE 0.52* 0.51* 0.48*  --  0.36* 0.36* 0.37* 0.35*  CALCIUM 7.8*  --  7.9*  --  7.8* 7.9* 8.3* 8.4*  MG 2.1 2.2 1.9 1.9  --  1.7  --   --   PHOS 2.8 2.2* 2.2* 1.8*  --  2.6  --   --    GFR: Estimated Creatinine Clearance: 56.6 mL/min (A) (by C-G formula based on SCr of 0.35 mg/dL (L)).  Liver Function Tests:  Recent Labs Lab 09/29/16 1500 10/01/16 0444 10/02/16 0300 10/04/16 0514  AST 16 19 21 20   ALT 18 14* 13* 15*  ALKPHOS 61 55 57 67  BILITOT 1.9* 0.5 0.5 0.5  PROT 6.4* 5.2* 5.2* 5.6*  ALBUMIN 2.4* 1.7* 1.7* 1.7*    Recent Labs Lab 09/29/16 1520  AMMONIA 34    Cardiac Enzymes:  Recent Labs Lab 09/29/16 1726  TROPONINI 0.07*    HbA1C: Hgb A1c MFr Bld  Date/Time Value Ref Range Status  08/24/2015 06:51 AM 6.1 (H) 4.8 - 5.6 % Final    Comment:    (NOTE)         Pre-diabetes: 5.7 - 6.4         Diabetes: >6.4         Glycemic control for adults with diabetes: <7.0   05/22/2015 02:22 PM 6.3 (H) 4.8 - 5.6 % Final    Comment:    (NOTE)         Pre-diabetes: 5.7 - 6.4         Diabetes: >6.4         Glycemic control for adults with diabetes: <7.0     CBG:  Recent Labs Lab 10/04/16 1720 10/04/16 2115 10/04/16 2350 10/05/16 0401 10/05/16 0836  GLUCAP 151* 173* 157* 131* 146*    Recent Results (from the past 240 hour(s))  Blood culture (routine x 2)     Status: None   Collection Time: 09/29/16  3:20 PM  Result Value Ref Range Status   Specimen  Description BLOOD RIGHT ARM  Final   Special Requests BOTTLES DRAWN AEROBIC AND ANAEROBIC 6CC  Final   Culture NO GROWTH 5 DAYS  Final   Report Status 10/04/2016 FINAL  Final  Blood culture (routine x 2)     Status: None   Collection Time: 09/29/16  3:20 PM  Result Value Ref Range Status   Specimen Description BLOOD RIGHT WRIST  Final   Special Requests BOTTLES DRAWN AEROBIC AND ANAEROBIC 6CC  Final   Culture NO GROWTH 5 DAYS  Final   Report Status 10/04/2016 FINAL  Final  Culture, respiratory (NON-Expectorated)     Status: None   Collection Time: 09/29/16  5:28 PM  Result Value Ref Range Status   Specimen Description TRACHEAL ASPIRATE  Final   Special Requests Normal  Final   Gram Stain   Final    MODERATE WBC PRESENT,BOTH PMN AND MONONUCLEAR NO ORGANISMS SEEN    Culture   Final    Consistent with normal respiratory flora. Performed at [facility], [place] [address] [place], [place] [address]    Report Status 10/02/2016 FINAL  Final  MRSA PCR Screening     Status: None   Collection Time: 09/30/16  1:11 AM  Result Value Ref Range Status   MRSA by PCR NEGATIVE NEGATIVE Final    Comment:  The GeneXpert MRSA Assay (FDA approved for NASAL specimens only), is one component of a comprehensive MRSA colonization surveillance program. It is not intended to diagnose MRSA infection nor to guide or monitor treatment for MRSA infections.   Body fluid culture     Status: None   Collection Time: 09/30/16  2:40 AM  Result Value Ref Range Status   Specimen Description GALL BLADDER  Final   Special Requests NONE  Final   Gram Stain NO WBC SEEN RARE GRAM POSITIVE COCCI IN PAIRS   Final   Culture RARE STAPHYLOCOCCUS SPECIES (COAGULASE NEGATIVE)  Final   Report Status 10/03/2016 FINAL  Final   Organism ID, Bacteria STAPHYLOCOCCUS SPECIES (COAGULASE NEGATIVE)  Final      Susceptibility   Staphylococcus species (coagulase negative) - MIC*    CIPROFLOXACIN >=8 RESISTANT  Resistant     ERYTHROMYCIN >=8 RESISTANT Resistant     GENTAMICIN <=0.5 SENSITIVE Sensitive     OXACILLIN >=4 RESISTANT Resistant     TETRACYCLINE 2 SENSITIVE Sensitive     VANCOMYCIN 1 SENSITIVE Sensitive     TRIMETH/SULFA 80 RESISTANT Resistant     CLINDAMYCIN >=8 RESISTANT Resistant     RIFAMPIN <=0.5 SENSITIVE Sensitive     Inducible Clindamycin NEGATIVE Sensitive     * RARE STAPHYLOCOCCUS SPECIES (COAGULASE NEGATIVE)  Body fluid culture     Status: None (Preliminary result)   Collection Time: 10/02/16  2:23 PM  Result Value Ref Range Status   Specimen Description FLUID RIGHT PLEURAL  Final   Special Requests NONE  Final   Gram Stain   Final    ABUNDANT WBC PRESENT,BOTH PMN AND MONONUCLEAR NO ORGANISMS SEEN    Culture NO GROWTH 3 DAYS  Final   Report Status PENDING  Incomplete     Scheduled Meds: . cefTAZidime (FORTAZ)  IV  2 g Intravenous Q8H  . Chlorhexidine Gluconate Cloth  6 each Topical Daily  . docusate sodium  100 mg Oral BID  . insulin aspart  0-9 Units Subcutaneous Q4H  . magnesium sulfate 1 - 4 g bolus IVPB  2 g Intravenous Once  . mouth rinse  15 mL Mouth Rinse BID  . mirtazapine  30 mg Oral QHS  . potassium chloride (KCL MULTIRUN) 30 mEq in 265 mL IVPB  30 mEq Intravenous Q4H  . sodium chloride flush  10-40 mL Intracatheter Q12H  . vancomycin  1,000 mg Intravenous Q12H  . Warfarin - Pharmacist Dosing Inpatient   Does not apply q1800   Continuous Infusions: . dextrose 40 mL/hr at 10/05/16 0534  . heparin 850 Units/hr (10/05/16 0221)     LOS: 6 days    Signature  Cheskel Silverio U Raymonde Hamblin  DO on 10/05/2016 at 10:11 AM  Between 7am to 7pm - Pager - [phone] ( page via [facility], text pages only, please mention full 10 digit call back number).  After 7pm go to [email or web address] - password [facility]

## 2016-10-05 NOTE — Progress Notes (Signed)
STROKE TEAM PROGRESS NOTE   HISTORY OF PRESENT ILLNESS (per record) Stephen Bates is an 83 y.o. male with multiple medical problems who was admitted on 3/27 after being found unresponsive at home. He was diagnosed with septic shock and acute hypoxic respiratory failure secondary to HCAP. He was intubated and initially managed in the ICU. Hospital problems have included newly diagnosed PAF and acute encephalopathy on his baseline of dementia. His condition improved, he was extubated and transferred to [address] [place]. A CT head was obtained, revealing a subacute medium sized left parietal lobe ischemic infarction. Neurology was consulted for further recommendations.     SUBJECTIVE (INTERVAL HISTORY) His family  Is not  at the bedside.  He   remains aphasic  OBJECTIVE Temp:  [97.7 F (36.5 C)-98.5 F (36.9 C)] 97.7 F (36.5 C) (04/01 0529) Pulse Rate:  [55-76] 55 (04/01 0529) Cardiac Rhythm: Heart block;Bundle branch block (04/01 0700) Resp:  [18] 18 (04/01 0529) BP: (113-126)/(39-55) 113/39 (04/01 0529) SpO2:  [94 %-100 %] 94 % (04/01 0529) Weight:  [63.6 kg (140 lb 4.8 oz)] 63.6 kg (140 lb 4.8 oz) (04/01 0459)  CBC:  Recent Labs Lab 09/29/16 1500  10/04/16 0514 10/05/16 0458  WBC 10.4  < > 9.5 9.7  NEUTROABS 8.8*  --   --   --   HGB 11.5*  < > 10.0* 9.9*  HCT 35.7*  < > 31.6* 30.9*  MCV 105.0*  < > 102.3* 100.7*  PLT 687*  < > 405* 383  < > = values in this interval not displayed.  Basic Metabolic Panel:  Recent Labs Lab 10/01/16 1709  10/03/16 0338 10/04/16 0514 10/05/16 0458  NA  --   < > 136 134* 132*  K  --   < > 3.0* 3.6 2.9*  CL  --   < > 95* 96* 94*  CO2  --   < > 35* 31 33*  GLUCOSE  --   < > 108* 130* 146*  BUN  --   < > 5* <5* <5*  CREATININE  --   < > 0.36* 0.37* 0.35*  CALCIUM  --   < > 7.9* 8.3* 8.4*  MG 1.9  --  1.7  --   --   PHOS 1.8*  --  2.6  --   --   < > = values in this interval not displayed.  Lipid Panel: No results found for: CHOL, TRIG, HDL,  CHOLHDL, VLDL, LDLCALC HgbA1c:  Lab Results  Component Value Date   HGBA1C 6.1 (H) 08/24/2015   Urine Drug Screen: No results found for: LABOPIA, COCAINSCRNUR, LABBENZ, AMPHETMU, THCU, LABBARB    IMAGING  Ct Head Wo Contrast 10/03/2016 1. Acute versus subacute nonhemorrhagic left parietal lobe (left MCA territory) infarct.     Dg Chest Port 1 View 10/03/2016 Slight increase in the degree of left basilar atelectasis and effusion. The overall inspiratory effort is poor which may account for some these changes.      Dg Chest Port 1 View 10/02/2016 Stable endotracheal tube position. Left arm PICC line is unchanged in position. Improvement in aeration in right base with resolved right pleural effusion. Minimal residual right basilar atelectasis. Persistent small left pleural effusion with left basilar atelectasis or infiltrate. No pulmonary edema. There is no pneumothorax.     PHYSICAL EXAM Obese elderly caucasian male not in distress. . Afebrile. Head is nontraumatic. Neck is supple without bruit.    Cardiac exam no murmur or gallop. Lungs  are clear to auscultation. Distal pulses are well felt. Neurological Exam :  Awake alert globally aphasic with receptive more than expressive aphasia. Speech at times nonsensical and difficult to understand. He does not follow commands except for occasional midline commands only. He blinks to threat on the left but not on the right side. Right lower facial weakness. Tongue midline. Eye movements appear to be full range. Tongue midline. Motor system exam right hemiparesis. Withdraws left-sided against gravity. Deep tendon reflexes are depressed on the right and 1+ on the left. Right plantar upgoing left downgoing.   ASSESSMENT/PLAN [name] is a 83 y.o. male with history of previous stroke, assay cancer, dementia, hypertension, diabetes, frequent falls, recent cholecystitis, and newly diagnosed PAF presenting with unresponsiveness, septic  shock, respiratory failure and pneumonia. He did not receive IV t-PA due to unknown time of onset.  Stroke: Acute versus subacute left MCA territory infarct - embolic 2/2 atrial fibrillation.  Resultant   global aphasia and right hemiparesis   MRI - not performed  MRA - not performed  Carotid Doppler - not performed.  2D Echo - EF 60 - 65 %. No cardiac source of emboli identified.  LDL - not performed  HgbA1c - not performed  VTE prophylaxis - IV Heparin / warfarin / SCDs Diet NPO time specified  aspirin 81 mg daily prior to admission, now on heparin IV and warfarin daily  Patient counseled to be compliant with his antithrombotic medications  Ongoing aggressive stroke risk factor management  Therapy recommendations: SNF recommended  Disposition: Pending  Hypertension  Stable  Permissive hypertension (OK if < 220/120) but gradually normalize in 5-7 days  Long-term BP goal normotensive  Hyperlipidemia  Home meds: no lipid lowering medications PTA.  LDL not performed, goal < 70  Continue statin at discharge  Diabetes  HgbA1c not performed, goal < 7.0  Unc / Controlled  Other Stroke Risk Factors  Advanced age  The patient quit smoking 47 years ago.  ETOH use - 1.8 ozs per week.  Hx stroke/TIA   Other Active Problems  Recent cholecystitis  - Vancomycin / Fortaz  Pneumonia  Anemia - 10.0 / 31.6  Na 134  DNR  Hospital day # 6   Continue IV heparin bridge till INR is optimal  on warfarin. Transfer to nursing home when stable and bed available. Stroke team will sign off. Kindly call for questions. Antony Contras, MD  To contact Stroke Continuity provider, please refer to [email or web address] After hours, contact General Neurology

## 2016-10-05 NOTE — Progress Notes (Signed)
[facility] for Heparin and warfarin  Indication: atrial fibrillation and acute CVA  No Known Allergies  Patient Measurements: Height: 5\' 5"  (165.1 cm) Weight: 140 lb 4.8 oz (63.6 kg) IBW/kg (Calculated) : 61.5   Vital Signs: Temp: 97.7 F (36.5 C) (04/01 0529) Temp Source: Axillary (04/01 0529) BP: 113/39 (04/01 0529) Pulse Rate: 55 (04/01 0529)  Labs:  Recent Labs  10/03/16 0338 10/04/16 0514 10/05/16 0046 10/05/16 0458 10/05/16 0934  HGB 9.8* 10.0*  --  9.9*  --   HCT 31.2* 31.6*  --  30.9*  --   PLT 403* 405*  --  383  --   LABPROT  --   --   --  15.2  --   INR  --   --   --  1.19  --   HEPARINUNFRC  --   --  0.19*  --  0.28*  CREATININE 0.36* 0.37*  --  0.35*  --     Estimated Creatinine Clearance: 56.6 mL/min (A) (by C-G formula based on SCr of 0.35 mg/dL (L)).   Medical History: Past Medical History:  Diagnosis Date  . Arthritis   . Benign neoplasm of colon   . Chronic back pain   . Diabetes ([place])   . Frequent falls 2012  . History of right bundle branch block   . Hypertension   . Lumbar radiculopathy   . Mild dementia   . Neuropathy ([place])   . Prostate cancer ([place])   . Stroke ([place]) 1982  . Weakness of both legs   . Wears glasses     Assessment: 83 yo male admitted and found to have atrial fibrillation and acute CVA. Heparin level this am of 0.28 is slightly below desired range of 0.3 - 0.5. INR 1.19.   Goal of Therapy:  Heparin level goal 0.3-0.5 Monitor platelets by anticoagulation protocol: Yes   Plan:  1. Continue heparin infusion at 850 units/hr 2. Recheck heparin level later this evening 3. Warfarin 2.5 mg x 1 this evening  4. Daily INR  Vincenza Hews, PharmD, BCPS 10/05/2016, 11:31 AM

## 2016-10-05 NOTE — Progress Notes (Signed)
ANTICOAGULATION CONSULT NOTE - Follow Up Consult  Pharmacy Consult for Heparin  Indication: atrial fibrillation and stroke  No Known Allergies  Patient Measurements: Height: 5\' 5"  (165.1 cm) Weight: 140 lb 4.8 oz (63.6 kg) IBW/kg (Calculated) : 61.5  Labs:  Recent Labs  10/02/16 0300 10/03/16 0338 10/04/16 0514 10/05/16 0046  HGB 10.3* 9.8* 10.0*  --   HCT 32.0* 31.2* 31.6*  --   PLT 461* 403* 405*  --   HEPARINUNFRC  --   --   --  0.19*  CREATININE 0.36* 0.36* 0.37*  --     Estimated Creatinine Clearance: 56.6 mL/min (A) (by C-G formula based on SCr of 0.37 mg/dL (L)).  Assessment: 83 y/o M on heparin for afib/CVA. Initial heparin level is sub-therapeutic at 0.19. No issues per RN.   Goal of Therapy:  Heparin level 0.3-0.7 units/ml Monitor platelets by anticoagulation protocol: Yes   Plan:  -Inc heparin to 850 units/hr -1000 HL  Hymen Arnett 10/05/2016,1:36 AM

## 2016-10-05 NOTE — Consult Note (Signed)
Consultation Note Date: 10/05/2016   Patient Name: Stephen Bates  DOB: 1926/11/22  MRN: [id number]  Age / Sex: 83 y.o., male  PCP: Asencion Noble, MD Referring Physician: Geradine Girt, DO  Reason for Consultation: Establishing goals of care, Hospice Evaluation and Psychosocial/spiritual support  HPI/Patient Profile: 83 y.o. male  with past medical history of Diabetes, dementia , prostate cancer, CVA, admitted on 09/29/2016 after being found unresponsive at home. He remained unresponsive in the emergency room and was intubated for airway protection. He required norepinephrine for shock. Chest x-ray concerning for multifocal pneumonia. He was transferred to the ICU then subsequently  stabilized and transferred to the hospitalist service. Imaging reflects the patient has had a new acute CVA. Patient was recently admitted to [facility] from 09/17/2016 to 09/24/2016 with acute cholecystitis. He now has a choleo cystostomy drain  Clinical Assessment and Goals of Care: Patient is alert. He can say a few words clearly, mostly "yes and no". He will sometimes perseverate on an answer. Many of his words are unintelligible. Per chart review patient is aspirating and was placed on a dysphagia 1 diet but has shown little interest in eating. He is pocketing food as well as lack of awareness how to eat. He has moderate to severe upper airway secretions currently  Patient is married and unfortunately she is also in the hospital on 3 W. I spoke with one of her daughters, Debroah Baller who lives in [place] by phone. There is another daughter [name], who is coming into town today and will be in the area for the next day or 2 to assist with decisions regarding her mother and father. They are hopeful for recovery for their mother, back to her baseline. Spent time discussing disease progression secondary to dementia with patient's  daughter Debroah Baller. Debroah Baller initially stated that the diagnosis of dementia was a mistake in his chart. That he in the past was mistakenly diagnosed with Parkinson's disease and was started on Sinemet which made things worse. This was subsequently stopped but pt did not improve.The disease trajectory she describes to me however seems to be related to an underlying diagnosis of dementia. She describes a clinical decline, when looking back at things,  over 3 years. She states even before admission on 3/26 her father had quit walking, had been having falls, and decreased appetite. She brought up the subject of feeding tubes which we also discussed the risks associated with those, particularly in dementia population. I explained to her that this does not rid him of risk of aspiration nor would nutrition improve his neurological status. Also shared with Debroah Baller that her father would likely meet hospice criteria  Pt's spouse, Quintez Maselli with the assistance of 2 daughters [name] and [name] are pt's surrogate decision makers    SUMMARY OF RECOMMENDATIONS   DNR/DNI Palliative Care to arrange mtg with daughter [name] and mother while in the hospital to review [place] specifically feeding tubes, dementia,   disease trajectory, as well as hospice hopefully, 10/06/16  Code  Status/Advance Care Planning:  DNR    Symptom Management:   Secretions: Patient has pneumonia, but is likely aspirating as well. Could try Robinul IV 0.2 every 4 hours as needed for excess secretions  Palliative Prophylaxis:   Aspiration, Bowel Regimen, Delirium Protocol, Eye Care, Frequent Pain Assessment, Oral Care and Turn Reposition  Additional Recommendations (Limitations, Scope, Preferences):  Full Scope Treatment except for DNR/DNI  Psycho-social/Spiritual:   Desire for further Chaplaincy support:no  Additional Recommendations: Education on Hospice  Prognosis:   < 6 months in the setting of acute CVA, dysphagia, aspiration,  functional decline as exhibited by bed bound status, poor by mouth intake prior to stroke (albumin 1.7) and recent cholecystitis  Discharge Planning: To Be Determined      Primary Diagnoses: Present on Admission: . 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 Mild dementia . Severe sepsis with septic shock ([place]) . Stroke [facility]) history . Malnutrition of moderate degree . HCAP (healthcare-associated pneumonia) . Septic shock ([place]) . Acute hypoxemic respiratory failure ([place])   I have reviewed the medical record, interviewed the patient and family, and examined the patient. The following aspects are pertinent.  Past Medical History:  Diagnosis Date  . Arthritis   . Benign neoplasm of colon   . Chronic back pain   . Diabetes ([place])   . Frequent falls 2012  . History of right bundle branch block   . Hypertension   . Lumbar radiculopathy   . Mild dementia   . Neuropathy ([place])   . Prostate cancer ([place])   . Stroke ([place]) 1982  . Weakness of both legs   . Wears glasses    Social History   Social History  . Marital status: Married    Spouse name: Mary  . Number of children: 2  . Years of education: HS   Occupational History  . Retired Physiological scientist   Social History Main Topics  . Smoking status: Former Smoker    Quit date: 06/08/1969  . Smokeless tobacco: Never Used  . Alcohol use 1.8 oz/week    3 Glasses of wine per week     Comment: "3 glasses of wine a day"  . Drug use: No  . Sexual activity: Not Asked   Other Topics Concern  . None   Social History Narrative   Lives with wife in one story home.  2 daughters.  Education: 11th grade.  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         Family History  Problem Relation Age of Onset  . Bladder Cancer Father   . Bronchopulmonary dysplasia Mother   . Colon cancer Sister    Scheduled Meds: . cefTAZidime (FORTAZ)  IV  2 g Intravenous Q8H  . Chlorhexidine Gluconate Cloth  6 each Topical Daily  .  docusate sodium  100 mg Oral BID  . insulin aspart  0-9 Units Subcutaneous Q4H  . mouth rinse  15 mL Mouth Rinse BID  . mirtazapine  30 mg Oral QHS  . potassium chloride (KCL MULTIRUN) 30 mEq in 265 mL IVPB  30 mEq Intravenous Q4H  . sodium chloride flush  10-40 mL Intracatheter Q12H  . vancomycin  1,000 mg Intravenous Q12H  . warfarin  2.5 mg Oral ONCE-1800  . Warfarin - Pharmacist Dosing Inpatient   Does not apply q1800   Continuous Infusions: . dextrose 40 mL/hr at 10/05/16 0534  . heparin 850 Units/hr (10/05/16 0221)   PRN Meds:.acetaminophen, polyethylene glycol, sodium  chloride flush Medications Prior to Admission:  Prior to Admission medications   Medication Sig Start Date End Date Taking? Authorizing Provider  acetaminophen (TYLENOL) 325 MG tablet Take 650 mg by mouth once as needed for mild pain or moderate pain.   Yes Historical Provider, MD  amoxicillin-clavulanate (AUGMENTIN) 875-125 MG tablet Take 1 tablet by mouth 2 (two) times daily. 09/24/16  Yes Asencion Noble, MD  aspirin EC 81 MG tablet Take 81 mg by mouth daily.   Yes Historical Provider, MD  benazepril (LOTENSIN) 40 MG tablet Take 1 tablet by mouth every morning.  10/04/13  Yes Historical Provider, MD  Cholecalciferol (VITAMIN D PO) Take 1 capsule by mouth daily.   Yes Historical Provider, MD  docusate sodium (COLACE) 100 MG capsule Take 100 mg by mouth daily as needed for mild constipation.    Yes Historical Provider, MD  metFORMIN (GLUCOPHAGE-XR) 500 MG 24 hr tablet Take 500 mg by mouth 2 (two) times daily.  08/22/15  Yes Historical Provider, MD  mirtazapine (REMERON) 30 MG tablet Take 30 mg by mouth at bedtime.   Yes Historical Provider, MD  Multiple Vitamins-Minerals (CENTRUM SILVER ADULT 50+ PO) Take 1 tablet by mouth every morning.   Yes Historical Provider, MD  oseltamivir (TAMIFLU) 75 MG capsule Take 75 mg by mouth 2 (two) times daily. 5 day course 09/27/16  Yes Historical Provider, MD  polyethylene glycol (MIRALAX /  GLYCOLAX) packet Take 17 g by mouth daily as needed for mild constipation.    Yes Historical Provider, MD  potassium chloride (K-DUR) 10 MEQ tablet Take 1 tablet (10 mEq total) by mouth 2 (two) times daily. 09/24/16  Yes Asencion Noble, MD  prochlorperazine (COMPAZINE) 10 MG tablet Take by mouth every 8 (eight) hours as needed for nausea or vomiting.  09/26/16  Yes Historical Provider, MD  triamcinolone cream (KENALOG) 0.1 % Apply 1 application topically 2 (two) times daily. To head 09/09/16  Yes Historical Provider, MD   No Known Allergies Review of Systems  Unable to perform ROS: Acuity of condition    Physical Exam  Constitutional:  Cachectic, frail elderly man  HENT:  Dressing to top of head  Neck: Normal range of motion.  Cardiovascular:  Irregular  Pulmonary/Chest: Effort normal.  Very moist sounding upper airway  Musculoskeletal: Normal range of motion.  Patient is no longer ambulatory  Neurological: He is alert.  New CVA with right-sided weakness, aphasic, probably aspirating  Skin: Skin is warm and dry.  Psychiatric:  Unable to test  Nursing note and vitals reviewed.   Vital Signs: BP (!) 113/39 (BP Location: Right Arm)   Pulse (!) 55   Temp 97.7 F (36.5 C) (Axillary)   Resp 18   Ht 5\' 5"  (1.651 m)   Wt 63.6 kg (140 lb 4.8 oz)   SpO2 94%   BMI 23.35 kg/m  Pain Assessment: No/denies pain   Pain Score: 0-No pain   SpO2: SpO2: 94 % O2 Device:SpO2: 94 % O2 Flow Rate: .O2 Flow Rate (L/min): 4 L/min  IO: Intake/output summary:  Intake/Output Summary (Last 24 hours) at 10/05/16 1516 Last data filed at 10/05/16 1027  Gross per 24 hour  Intake          1554.05 ml  Output             1800 ml  Net          -245.95 ml    LBM: Last BM Date: 10/04/16 Baseline Weight: Weight: 61.2 kg (134  lb 14.7 oz) Most recent weight: Weight: 63.6 kg (140 lb 4.8 oz)     Palliative Assessment/Data:   Flowsheet Rows     Most Recent Value  Intake Tab  Referral Department   Hospitalist  Unit at Time of Referral  Med/Surg Unit  Palliative Care Primary Diagnosis  Neurology  Date Notified  10/04/16  Palliative Care Type  New Palliative care  Reason for referral  Clarify Goals of Care, Counsel Regarding Hospice  Date of Admission  09/29/16  Date first seen by Palliative Care  10/05/16  # of days Palliative referral response time  1 Day(s)  # of days IP prior to Palliative referral  5  Clinical Assessment  Palliative Performance Scale Score  30%  Pain Max last 24 hours  Not able to report  Pain Min Last 24 hours  Not able to report  Dyspnea Max Last 24 Hours  Not able to report  Dyspnea Min Last 24 hours  Not able to report  Nausea Max Last 24 Hours  Not able to report  Nausea Min Last 24 Hours  Not able to report  Anxiety Max Last 24 Hours  Not able to report  Anxiety Min Last 24 Hours  Not able to report  Other Max Last 24 Hours  Not able to report  Psychosocial & Spiritual Assessment  Palliative Care Outcomes  Patient/Family meeting held?  Yes  Who was at the meeting?  dtr [place] via phone. Trying to arrange mtg with both daughters and their mother who is also in the hospital  [place] regarding hospice, Provided psychosocial or spiritual support  Patient/Family wishes: Interventions discontinued/not started   Mechanical Ventilation, Vasopressors, Trach  Palliative Care follow-up planned  Yes, Facility      Time In: 1315 Time Out: 1425 Time Total: 70 min Greater than 50%  of this time was spent counseling and coordinating care related to the above assessment and plan. Staffed with Dr. Eliseo Squires  Signed by: Dory Horn, NP   Please contact Palliative Medicine Team phone at [phone] for questions and concerns.  For individual provider: See Shea Evans

## 2016-10-05 NOTE — Progress Notes (Signed)
  Speech Language Pathology Treatment: Dysphagia  Patient Details Name: Stephen Bates MRN: [id number] DOB: 05/06/1927 Today's Date: 10/05/2016 Time: 8938-1017 SLP Time Calculation (min) (ACUTE ONLY): 15 min  Assessment / Plan / Recommendation Clinical Impression  Ongoing f/u - pt with aphasia, tends to perseverate on single words, however improved spontaneity of verbalizations today. Improved mobility of RUE with pt able to wipe mouth, blow nose, etc. Not following commands.  Swallowing function remains inconsistent - primary barrier is ongoing refusals.  Pt consumed water from straw initially during session with no overt s/s of aspiration.  Was amenable to several spoonsful of pudding, but after progressive boluses, began coughing.  Oral residue noted right buccal cavity; likely intermittent aspiration. Spoke with Dr. Eliseo Bates, who is speaking with pt's daughters today re: prognosis. Palliative medicine has been consulted.  Plan to begin dysphagia 1 diet with thin liquids; careful, cautious hand-feeding.  Defer to pt's desires and cease feeding if he refuses or begins coughing repetitively.  SLP will follow for [place] post-meeting with Palliative Medicine.     HPI HPI: 83 year old male with PMH significant for DM, bedbound, dementia, prostate Ca, and CVA. He was also recently admitted 3/14 >3/21 to [facility] for cholecystitis treated with IV abx and cystostomy placement. Hospital course complicated by PAF, which resolved spontaneously. He was discharged to home with home health nursing. 3/26 he presented to ED after being found unresponsive at home. He remained unresponsive in ED and was intubated for airway protection. He was transferred to [facility] care.  ETT 3/26-3/29.  Dx acute hypoxic resp failure in setting of HCAP, septic shock - resolved.  Changes in behavior with weakness, gaze preference; CT 3/30 Acute versus subacute nonhemorrhagic left parietal lobe (left MCA      SLP Plan  Continue with current plan of care       Recommendations  Diet recommendations: Dysphagia 1 (puree);Thin liquid Liquids provided via: Cup;Straw Medication Administration: Crushed with puree Supervision: Full supervision/cueing for compensatory strategies;Staff to assist with self feeding Compensations: Minimize environmental distractions;Slow rate;Small sips/bites Postural Changes and/or Swallow Maneuvers: Seated upright 90 degrees                Oral Care Recommendations: Oral care BID Plan: Continue with current plan of care       GO               Jaymi Tinner L. Tivis Ringer, [place] CCC/SLP Pager [phone]  [name] 10/05/2016, 9:54 AM

## 2016-10-06 ENCOUNTER — Inpatient Hospital Stay (HOSPITAL_COMMUNITY): Payer: Medicare Other

## 2016-10-06 DIAGNOSIS — Z7189 Other specified counseling: Secondary | ICD-10-CM

## 2016-10-06 LAB — CBC
HCT: 30.5 % — ABNORMAL LOW (ref 39.0–52.0)
HEMOGLOBIN: 9.8 g/dL — AB (ref 13.0–17.0)
MCH: 32 pg (ref 26.0–34.0)
MCHC: 32.1 g/dL (ref 30.0–36.0)
MCV: 99.7 fL (ref 78.0–100.0)
Platelets: 345 10*3/uL (ref 150–400)
RBC: 3.06 MIL/uL — AB (ref 4.22–5.81)
RDW: 13.3 % (ref 11.5–15.5)
WBC: 9.2 10*3/uL (ref 4.0–10.5)

## 2016-10-06 LAB — GLUCOSE, CAPILLARY
GLUCOSE-CAPILLARY: 108 mg/dL — AB (ref 65–99)
GLUCOSE-CAPILLARY: 128 mg/dL — AB (ref 65–99)
GLUCOSE-CAPILLARY: 137 mg/dL — AB (ref 65–99)
GLUCOSE-CAPILLARY: 156 mg/dL — AB (ref 65–99)
Glucose-Capillary: 128 mg/dL — ABNORMAL HIGH (ref 65–99)
Glucose-Capillary: 129 mg/dL — ABNORMAL HIGH (ref 65–99)
Glucose-Capillary: 96 mg/dL (ref 65–99)

## 2016-10-06 LAB — BASIC METABOLIC PANEL
Anion gap: 5 (ref 5–15)
BUN: <5 mg/dL — ABNORMAL LOW (ref 6–20)
CHLORIDE: 98 mmol/L — AB (ref 101–111)
CO2: 32 mmol/L (ref 22–32)
Calcium: 8.1 mg/dL — ABNORMAL LOW (ref 8.9–10.3)
Creatinine, Ser: 0.4 mg/dL — ABNORMAL LOW (ref 0.61–1.24)
GFR calc non Af Amer: >60 mL/min (ref >60)
Glucose, Bld: 122 mg/dL — ABNORMAL HIGH (ref 65–99)
POTASSIUM: 3.3 mmol/L — AB (ref 3.5–5.1)
SODIUM: 135 mmol/L (ref 135–145)

## 2016-10-06 LAB — PROTIME-INR
INR: 1.34
PROTHROMBIN TIME: 16.7 s — AB (ref 11.4–15.2)

## 2016-10-06 LAB — HEPARIN LEVEL (UNFRACTIONATED)
HEPARIN UNFRACTIONATED: 0.18 [IU]/mL — AB (ref 0.30–0.70)
Heparin Unfractionated: 0.24 IU/mL — ABNORMAL LOW (ref 0.30–0.70)

## 2016-10-06 LAB — BODY FLUID CULTURE: CULTURE: NO GROWTH

## 2016-10-06 MED ORDER — POTASSIUM CHLORIDE CRYS ER 20 MEQ PO TBCR
20.0000 meq | EXTENDED_RELEASE_TABLET | Freq: Once | ORAL | Status: AC
  Administered 2016-10-06: 20 meq via ORAL
  Filled 2016-10-06: qty 1

## 2016-10-06 MED ORDER — ENSURE ENLIVE PO LIQD
237.0000 mL | Freq: Two times a day (BID) | ORAL | Status: DC
  Administered 2016-10-06 – 2016-10-14 (×12): 237 mL via ORAL

## 2016-10-06 MED ORDER — POTASSIUM CHLORIDE CRYS ER 20 MEQ PO TBCR
40.0000 meq | EXTENDED_RELEASE_TABLET | Freq: Once | ORAL | Status: AC
  Administered 2016-10-06: 40 meq via ORAL
  Filled 2016-10-06: qty 2

## 2016-10-06 MED ORDER — WARFARIN SODIUM 2.5 MG PO TABS
2.5000 mg | ORAL_TABLET | Freq: Once | ORAL | Status: AC
  Administered 2016-10-06: 2.5 mg via ORAL
  Filled 2016-10-06: qty 1

## 2016-10-06 NOTE — Progress Notes (Signed)
Palliative:  Dr. Maylene Roes has spoken with family who agree to meet tomorrow 10/07/16 at 1300. We will discuss options further at that time. Thank you for this consult.   No charge  Vinie Sill, NP Palliative Medicine Team Pager # [phone] (M-F 8a-5p) Team Phone # [phone] (Nights/Weekends)

## 2016-10-06 NOTE — Progress Notes (Signed)
ANTICOAGULATION CONSULT NOTE   Pharmacy Consult for Heparin Indication: atrial fibrillation  No Known Allergies  Patient Measurements: Height: 5\' 5"  (165.1 cm) Weight: 142 lb 3.2 oz (64.5 kg) IBW/kg (Calculated) : 61.5   Vital Signs: Temp: 98.5 F (36.9 C) (04/02 0536) BP: 133/44 (04/02 0536) Pulse Rate: 78 (04/02 0536)  Labs:  Recent Labs  10/04/16 0514  10/05/16 0458 10/05/16 0934 10/05/16 1815 10/06/16 0513  HGB 10.0*  --  9.9*  --   --   --   HCT 31.6*  --  30.9*  --   --   --   PLT 405*  --  383  --   --   --   LABPROT  --   --  15.2  --   --  16.7*  INR  --   --  1.19  --   --  1.34  HEPARINUNFRC  --   < >  --  0.28* 0.24* 0.18*  CREATININE 0.37*  --  0.35*  --   --   --   < > = values in this interval not displayed.  Estimated Creatinine Clearance: 56.6 mL/min (A) (by C-G formula based on SCr of 0.35 mg/dL (L)).  Assessment: 83 yo male admitted with atrial fibrillation and acute CVA for heparin.   Goal of Therapy:  Heparin level goal 0.3-0.5 Monitor platelets by anticoagulation protocol: Yes    Plan:  Increase Heparin  1100 units/hr Check heparin level in 8 hours.  Phillis Knack, PharmD, BCPS

## 2016-10-06 NOTE — Progress Notes (Signed)
Daily Progress Note   Patient Name: Stephen Bates       Date: 10/06/2016 DOB: January 10, 1927  Age: 83 y.o. MRN#: [id number] Attending Physician: Shon Millet* Primary Care Physician: Asencion Noble, MD Admit Date: 09/29/2016  Reason for Consultation/Follow-up: Establishing goals of care and Hospice Evaluation d/t new stroke and declining functional status.   Subjective: Stephen Bates is in bed. No family at bedside. Will only answer "yes" even to questions such as asking his birthday or where he is currently.   Length of Stay: 7  Current Medications: Scheduled Meds:  . cefTAZidime (FORTAZ)  IV  2 g Intravenous Q8H  . Chlorhexidine Gluconate Cloth  6 each Topical Daily  . docusate sodium  100 mg Oral BID  . insulin aspart  0-9 Units Subcutaneous Q4H  . mouth rinse  15 mL Mouth Rinse BID  . mirtazapine  30 mg Oral QHS  . potassium chloride  20 mEq Oral Once  . sodium chloride flush  10-40 mL Intracatheter Q12H  . vancomycin  1,000 mg Intravenous Q12H  . Warfarin - Pharmacist Dosing Inpatient   Does not apply q1800    Continuous Infusions: . dextrose 20 mL/hr at 10/05/16 1043  . heparin 1,100 Units/hr (10/06/16 0605)    PRN Meds: acetaminophen, polyethylene glycol, sodium chloride flush  Physical Exam  Constitutional: He is oriented to person, place, and time.  Thin, frail  HENT:  Dressing on head CDI  Cardiovascular: Normal rate.  An irregular rhythm present.  Pulmonary/Chest: He has decreased breath sounds. He has rhonchi.  Abdominal:  Multiple skin tears, perc chole draining to gravity drainage is bilious  Neurological: He is oriented to person, place, and time.  Only responded "yes" to any questions  Nursing note and vitals reviewed.           Vital Signs: BP (!)  133/44 (BP Location: Right Arm)   Pulse 78   Temp 98.5 F (36.9 C)   Resp 20   Ht 5' 5"  (1.651 m)   Wt 64.5 kg (142 lb 3.2 oz)   SpO2 92%   BMI 23.66 kg/m  SpO2: SpO2: 92 % O2 Device: O2 Device: Not Delivered O2 Flow Rate: O2 Flow Rate (L/min): 4 L/min  Intake/output summary:  Intake/Output Summary (Last 24 hours) at 10/06/16 1052 Last data filed at  10/06/16 1007  Gross per 24 hour  Intake          1517.36 ml  Output             2175 ml  Net          -657.64 ml   LBM: Last BM Date: 10/04/16 Baseline Weight: Weight: 61.2 kg (134 lb 14.7 oz) Most recent weight: Weight: 64.5 kg (142 lb 3.2 oz)       Palliative Assessment/Data:    Flowsheet Rows     Most Recent Value  Intake Tab  Referral Department  Hospitalist  Unit at Time of Referral  Med/Surg Unit  Palliative Care Primary Diagnosis  Neurology  Date Notified  10/04/16  Palliative Care Type  New Palliative care  Reason for referral  Clarify Goals of Care, Counsel Regarding Hospice  Date of Admission  09/29/16  Date first seen by Palliative Care  10/05/16  # of days Palliative referral response time  1 Day(s)  # of days IP prior to Palliative referral  5  Clinical Assessment  Palliative Performance Scale Score  30%  Pain Max last 24 hours  Not able to report  Pain Min Last 24 hours  Not able to report  Dyspnea Max Last 24 Hours  Not able to report  Dyspnea Min Last 24 hours  Not able to report  Nausea Max Last 24 Hours  Not able to report  Nausea Min Last 24 Hours  Not able to report  Anxiety Max Last 24 Hours  Not able to report  Anxiety Min Last 24 Hours  Not able to report  Other Max Last 24 Hours  Not able to report  Psychosocial & Spiritual Assessment  Palliative Care Outcomes  Patient/Family meeting held?  Yes  Who was at the meeting?  dtr [place] via phone. Trying to arrange mtg with both daughters and their mother who is also in the hospital  [place] regarding hospice,  Provided psychosocial or spiritual support  Patient/Family wishes: Interventions discontinued/not started   Mechanical Ventilation, Vasopressors, Trach  Palliative Care follow-up planned  Yes, Facility      Patient Active Problem List   Diagnosis Date Noted  . Acute CVA (cerebrovascular accident) ([place])   . Palliative care encounter   . Encephalopathy   . Pleural effusion   . Acute respiratory failure with hypoxia ([place]) 09/30/2016  . Severe sepsis with septic shock ([place]) 09/29/2016  . HCAP (healthcare-associated pneumonia) 09/29/2016  . Septic shock ([place]) 09/29/2016  . Pressure injury of skin 09/18/2016  . History of right bundle branch block   . Diabetes ([place])   . Cholecystitis 08/24/2015  . Acute cholecystitis 08/24/2015  . Malnutrition of moderate degree 05/24/2015  . Pressure ulcer 05/23/2015  . RUQ abdominal pain 05/22/2015  . 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 CAP (community acquired pneumonia) 01/05/2014  . Hyponatremia 01/03/2014  . Ileus ([place]) 01/03/2014  . Dehydration, mild 01/03/2014  . Mild dementia 11/29/2013  . Parkinsonism ([place]) 11/29/2013  . Gait difficulty 11/29/2013  . Neuropathy ([place]) 11/29/2013  . Memory loss 11/29/2013  . Stroke [facility]) history 07/07/1980    Palliative Care Assessment & Plan   HPI: 83 y.o. male  with past medical history of Diabetes, dementia , prostate cancer, CVA, admitted on 09/29/2016 after being found unresponsive at home. He remained unresponsive in the emergency room and was intubated for airway protection. He required norepinephrine for shock. Chest x-ray concerning for multifocal pneumonia. He was  transferred to the ICU then subsequently  stabilized and transferred to the hospitalist service. Imaging reflects the patient has had a new acute CVA. Patient was recently admitted to [facility] from 09/17/2016 to 09/24/2016 with acute cholecystitis. He now has a choleo cystostomy drain. Remains with poor appetite and intake.    Assessment: I met with Stephen Bates who is pleasant but very confused. Alert but unable to engage in conversation. Per NT at bedside he did not eat any of his breakfast.   I spoke with daughter, Stephen Bates, to schedule meeting time with herself and her mother (sister Stephen Bates as well if able - she spoke with my colleague yesterday). Stephen Bates has spoken with her sister and says she cannot meet until tomorrow. I gave her my number to let me know what time they wish to meet tomorrow.   Addendum: I received a message from daughter, Stephen Bates, who was questioning if we needed to meet. Message relays that her mother "wants anything possible done for him including the feeding tube." Unfortunately I have not had a conversation with family myself and I am not sure that they have been able to see him in person. I have left a message for Renee requesting that we still meet for support and to help them with expectations and a plan if they are willing. I will await return call. Updated Dr. Maylene Roes.   Recommendations/Plan: Likely aspiration: Consider Robinul IV 0.2 every 4 hours prn if needed. Does not appear to need this at this time.   Goals of Care and Additional Recommendations:  Limitations on Scope of Treatment: Undetermined  Code Status:  DNR I have not discussed  Prognosis:   < 6 months  Discharge Planning:  To Be Determined   Thank you for allowing the Palliative Medicine Team to assist in the care of this patient.   Total Time 8mn Prolonged Time Billed  no       Greater than 50%  of this time was spent counseling and coordinating care related to the above assessment and plan.  AVinie Sill NP Palliative Medicine Team Pager # [phone](M-F 8a-5p) Team Phone # [phone](Nights/Weekends)

## 2016-10-06 NOTE — Care Management Important Message (Signed)
Important Message  Patient Details  Name: Stephen Bates MRN: [id number] Date of Birth: [birth year]   Medicare Important Message Given:  Yes    Joyous Gleghorn [address]ine Circle 10/06/2016, 4:50 PM

## 2016-10-06 NOTE — Progress Notes (Signed)
Nutrition Follow-up  DOCUMENTATION CODES:   Non-severe (moderate) malnutrition in context of chronic illness  INTERVENTION:   -Ensure Enlive po BID, each supplement provides 350 kcal and 20 grams of protein -Magic Cup TID with meals  NUTRITION DIAGNOSIS:   Malnutrition (Moderate) related to  (dementia, bed bound, poor dention/difficulty chewing) as evidenced by energy intake < or equal to 75% for > or equal to 1 month, mild depletion of body fat, moderate depletions of muscle mass.  Ongoing  GOAL:   Patient will meet greater than or equal to 90% of their needs  Unmet  MONITOR:   Vent status, TF tolerance, Labs, Skin, I & O's  REASON FOR ASSESSMENT:   Consult Enteral/tube feeding initiation and management  ASSESSMENT:   83 year old male with PMH of DM, bedbound, dementia, prostate Ca, and CVA. He presented to [facility] ED after being found unresponsive at home. He remained unresponsive in ED and was intubated for airway protection. CXR concerning for multifocal PNA. He was transferred to [facility] for ICU care.   3/29- extubated  SLP has been following; pt has very little interest in eating and has been refusing most PO intake, even during BSE assessments.   Pt sleeping soundly at time of visit. No family present.   Pt has upgraded to a dysphagia 1 diet with thin liquids by MD. Meal completion 0%. RD will add variety of supplements in attempt to support intake.   Neurology completed consult related to acutet CVA.  Palliative care team following; plan for family meeting later today to discuss goals of care.   Labs reviewed: K: 3.3 (on PO supplementation), CBGS: 96-128.   Diet Order:  DIET - DYS 1 Room service appropriate? Yes; Fluid consistency: Thin  Skin:  Wound (see comment) (stage I and II to sacrum)  Last BM:  10/03/16  Height:   Ht Readings from Last 1 Encounters:  10/03/16 5\' 5"  (1.651 m)    Weight:   Wt Readings from Last 1 Encounters:  10/06/16 142 lb  3.2 oz (64.5 kg)    Ideal Body Weight:  59.1 kg  BMI:  Body mass index is 23.66 kg/m.  Estimated Nutritional Needs:   Kcal:  1400-1600  Protein:  70-85 grams  Fluid:  1.4-1.6 L  EDUCATION NEEDS:   No education needs identified at this time  Howard Bunte A. Jimmye Norman, RD, LDN, CDE Pager: [phone] After hours Pager: [phone]

## 2016-10-06 NOTE — Plan of Care (Signed)
Problem: Pain Managment: Goal: General experience of comfort will improve Outcome: Progressing Pt has dysphasia

## 2016-10-06 NOTE — Progress Notes (Signed)
[place] for Heparin and warfarin  Indication: atrial fibrillation and acute CVA  No Known Allergies  Patient Measurements: Height: 5\' 5"  (165.1 cm) Weight: 142 lb 3.2 oz (64.5 kg) IBW/kg (Calculated) : 61.5   Vital Signs: Temp: 98.5 F (36.9 C) (04/02 0536) BP: 133/44 (04/02 0536) Pulse Rate: 78 (04/02 0536)  Labs:  Recent Labs  10/04/16 0514  10/05/16 0458  10/05/16 1815 10/06/16 0513 10/06/16 0815 10/06/16 1422  HGB 10.0*  --  9.9*  --   --   --  9.8*  --   HCT 31.6*  --  30.9*  --   --   --  30.5*  --   PLT 405*  --  383  --   --   --  345  --   LABPROT  --   --  15.2  --   --  16.7*  --   --   INR  --   --  1.19  --   --  1.34  --   --   HEPARINUNFRC  --   < >  --   < > 0.24* 0.18*  --  0.24*  CREATININE 0.37*  --  0.35*  --   --   --  0.40*  --   < > = values in this interval not displayed.  Estimated Creatinine Clearance: 56.6 mL/min (A) (by C-G formula based on SCr of 0.4 mg/dL (L)).   Medical History: Past Medical History:  Diagnosis Date  . Arthritis   . Benign neoplasm of colon   . Chronic back pain   . Diabetes ([place])   . Frequent falls 2012  . History of right bundle branch block   . Hypertension   . Lumbar radiculopathy   . Mild dementia   . Neuropathy ([place])   . Prostate cancer ([place])   . Stroke ([place]) 1982  . Weakness of both legs   . Wears glasses     Assessment: 83 yo male admitted and found to have atrial fibrillation and acute CVA. Heparin level remains slightly subtherapeutic at 0.24, desired range of 0.3 - 0.5. INR trending up but also remains subtherapeutic at 1.34. RN endorses no problems or interruptions in the heparin infusion. No bleeding noted.   Goal of Therapy:  Heparin level goal 0.3-0.5 Monitor platelets by anticoagulation protocol: Yes   Plan:  1. Increase heparin infusion to 1200 units/hr 2. Recheck heparin level in 8 hours 3. Repeat warfarin 2.5 mg x 1 this evening  4. Monitor  daily INR, CBC, clinical course, s/sx of bleed, PO intake, DDI   Thank you for allowing [place] to participate in this patients care.  Jens Som, PharmD Clinical phone for 10/06/2016 from 7a-3:30p: [phone] If after 3:30p, please call main pharmacy at: [phone] 10/06/2016 3:12 PM

## 2016-10-06 NOTE — Progress Notes (Signed)
PROGRESS NOTE    Stephen Bates  YIR:[id number] DOB: 03-18-1927 DOA: 09/29/2016 PCP: Asencion Noble, MD     Brief Narrative:  Stephen Bates is a 83 year old male with hx of DM, dementia, prostate CA, and CVA who is bedbound. He was admitted 3/14-3/21 to [facility] for cholecystitis treated with IV antibiotics and cystostomy placement. Hospital course complicated by PAF, which resolved spontaneously. He was discharged to home with home health nursing. On 3/26 he returned to the ED after being found unresponsive at home. He remained unresponsive in ED and was intubated for airway protection. He required norepinephrine for shock. CXR was concerning for multifocal PNA. He was transferred to [facility] for ICU care. He was transferred to hospitalist service on 10/03/2016. Unfortunately on imaging he was found to have acute CVA on 10/03/2016.  Significant Events: 3/27 admitted w/ septic shock due to HCAP and on-going cholecystitis. Got additional volume + 3.3 liters. Weaned pressors to off. Considered right thoracentesis but due to agitation and body habitus, held off 3/28 weaned sedating gtts to off. 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[place])   Malnutrition of moderate degree   History of right bundle branch block   Diabetes ([place])   Stroke ([place]) history   HCAP (healthcare-associated pneumonia)   Septic shock ([place])   Acute respiratory failure with hypoxia ([place])   Encephalopathy   Pleural effusion   Acute CVA (cerebrovascular accident) [facility])   Palliative care encounter   Goals of care, counseling/discussion  Septic shock with Acute Hypoxic respiratory failure due to HCAP w/ R>L effusions -Improving clinically -Wean O2 as able -Blood cultures negative  -Continue vancomycin and Fortaz -Repeat CXR today   Recent acute  cholecystitis s/p perc drain placed 3/19 -Perc drain to remain  -Culture with coag neg staph  -Continue vancomycin and Fortaz for now  -Outpatient general surgery follow-up  Newly PAFib -In and out of NSR -Telemetry   Acute CVA -Has expressive aphasia along with mild right-sided weakness -Coumadin probably will be the best option as easily reversible. Heparin gtt to bridge -Stroke team followed and now signed off  Dysphagia -Due to stroke. Speech following, will start DYS 1 with aspiration precautions  Chronic anemia w/out s/sx bleeding -Hgb stable  DM  -SSI -Continue D10 due to poor oral intake   Acute encephalopathy  -Mild chronic dementia, Acute CVA -Continue remeron   Goals of care -Discussed with one of the daughters over the phone regarding poor prognosis and quality of life. Discussed also with Palliative care. Family meeting scheduled for 4/3 at 1pm.    DVT prophylaxis: Heparin gtt/Coumadin  Code Status: DNR Family Communication: daughter over the phone Disposition Plan: pending family meeting   Consultants:   PCCM  Neurology  Palliative Care  Antimicrobials:  Anti-infectives    Start     Dose/Rate Route Frequency Ordered Stop   10/04/16 1000  vancomycin (VANCOCIN) IVPB 1000 mg/200 mL premix     1,000 mg 200 mL/hr over 60 Minutes Intravenous Every 12 hours 10/04/16 0902     10/03/16 0800  vancomycin (VANCOCIN) IVPB 1000 mg/200 mL premix  Status:  Discontinued     1,000 mg 200 mL/hr over 60 Minutes Intravenous Every 12 hours 10/02/16 1834 10/03/16 0958   10/02/16 2000  vancomycin (VANCOCIN) 500 mg in sodium  chloride 0.9 % 100 mL IVPB     500 mg 100 mL/hr over 60 Minutes Intravenous  Once 10/02/16 1834 10/02/16 2133   10/02/16 1700  cefTAZidime (FORTAZ) 2 g in dextrose 5 % 50 mL IVPB     2 g 100 mL/hr over 30 Minutes Intravenous Every 8 hours 10/02/16 1207     09/30/16 0500  vancomycin (VANCOCIN) 500 mg in sodium chloride 0.9 % 100 mL IVPB   Status:  Discontinued     500 mg 100 mL/hr over 60 Minutes Intravenous Every 12 hours 09/30/16 0112 10/02/16 1834   09/30/16 0130  cefTAZidime (FORTAZ) 2 g in dextrose 5 % 50 mL IVPB  Status:  Discontinued     2 g 100 mL/hr over 30 Minutes Intravenous Every 12 hours 09/30/16 0112 10/02/16 1207   09/29/16 1530  piperacillin-tazobactam (ZOSYN) IVPB 3.375 g     3.375 g 100 mL/hr over 30 Minutes Intravenous  Once 09/29/16 1521 09/29/16 1652   09/29/16 1530  vancomycin (VANCOCIN) IVPB 1000 mg/200 mL premix     1,000 mg 200 mL/hr over 60 Minutes Intravenous  Once 09/29/16 1521 09/29/16 1735       Subjective: Patient is somnolent on my exam, does not arouse to verbal stimuli. No acute events reported.    Objective: Vitals:   10/05/16 1649 10/05/16 2226 10/06/16 0536 10/06/16 1609  BP: 137/63 91/74 (!) 133/44 (!) 160/79  Pulse: 84 86 78 79  Resp: (!) 21 20  16   Temp: 98.6 F (37 C) 97.7 F (36.5 C) 98.5 F (36.9 C) 99.2 F (37.3 C)  TempSrc: Oral   Oral  SpO2: 100% 93% 92% 95%  Weight:   64.5 kg (142 lb 3.2 oz)   Height:        Intake/Output Summary (Last 24 hours) at 10/06/16 1648 Last data filed at 10/06/16 1627  Gross per 24 hour  Intake          2442.69 ml  Output             3075 ml  Net          -632.31 ml   Filed Weights   10/04/16 0458 10/05/16 0459 10/06/16 0536  Weight: 63.6 kg (140 lb 4.8 oz) 63.6 kg (140 lb 4.8 oz) 64.5 kg (142 lb 3.2 oz)    Examination:  General exam: Appears calm and comfortable, sleeping, somnolent  Respiratory system: Clear to auscultation anteriorly. Respiratory effort normal. Cardiovascular system: S1 & S2 heard, RRR. No JVD, murmurs, rubs, gallops or clicks. No pedal edema. Gastrointestinal system: Abdomen is nondistended, soft and nontender. No organomegaly or masses felt. Normal bowel sounds heard. Central nervous system: Unable to test, patient somnolent  Extremities: Symmetric  Skin: No rashes, lesions or ulcers on exposed skin     Data Reviewed: I have personally reviewed following labs and imaging studies  CBC:  Recent Labs Lab 10/02/16 0300 10/03/16 0338 10/04/16 0514 10/05/16 0458 10/06/16 0815  WBC 13.7* 10.8* 9.5 9.7 9.2  HGB 10.3* 9.8* 10.0* 9.9* 9.8*  HCT 32.0* 31.2* 31.6* 30.9* 30.5*  MCV 101.6* 101.6* 102.3* 100.7* 99.7  PLT 461* 403* 405* 383 283   Basic Metabolic Panel:  Recent Labs Lab 09/30/16 1054 09/30/16 1713 10/01/16 0444 10/01/16 1709 10/02/16 0300 10/03/16 0338 10/04/16 0514 10/05/16 0458 10/05/16 1000 10/06/16 0815  NA 133*  --  134*  --  136 136 134* 132*  --  135  K 4.0  --  4.0  --  3.0* 3.0* 3.6 2.9*  --  3.3*  CL 103  --  102  --  97* 95* 96* 94*  --  98*  CO2 23  --  27  --  34* 35* 31 33*  --  32  GLUCOSE 152*  --  205*  --  51* 108* 130* 146*  --  122*  BUN 6  --  5*  --  5* 5* <5* <5*  --  <5*  CREATININE 0.52* 0.51* 0.48*  --  0.36* 0.36* 0.37* 0.35*  --  0.40*  CALCIUM 7.8*  --  7.9*  --  7.8* 7.9* 8.3* 8.4*  --  8.1*  MG 2.1 2.2 1.9 1.9  --  1.7  --   --  1.8  --   PHOS 2.8 2.2* 2.2* 1.8*  --  2.6  --   --   --   --    GFR: Estimated Creatinine Clearance: 56.6 mL/min (A) (by C-G formula based on SCr of 0.4 mg/dL (L)). Liver Function Tests:  Recent Labs Lab 10/01/16 0444 10/02/16 0300 10/04/16 0514  AST 19 21 20   ALT 14* 13* 15*  ALKPHOS 55 57 67  BILITOT 0.5 0.5 0.5  PROT 5.2* 5.2* 5.6*  ALBUMIN 1.7* 1.7* 1.7*   No results for input(s): LIPASE, AMYLASE in the last 168 hours. No results for input(s): AMMONIA in the last 168 hours. Coagulation Profile:  Recent Labs Lab 10/05/16 0458 10/06/16 0513  INR 1.19 1.34   Cardiac Enzymes:  Recent Labs Lab 09/29/16 1726  TROPONINI 0.07*   BNP (last 3 results) No results for input(s): PROBNP in the last 8760 hours. HbA1C: No results for input(s): HGBA1C in the last 72 hours. CBG:  Recent Labs Lab 10/06/16 0008 10/06/16 0402 10/06/16 0752 10/06/16 1236 10/06/16 1638  GLUCAP 128* 96  108* 128* 129*   Lipid Profile: No results for input(s): CHOL, HDL, LDLCALC, TRIG, CHOLHDL, LDLDIRECT in the last 72 hours. Thyroid Function Tests: No results for input(s): TSH, T4TOTAL, FREET4, T3FREE, THYROIDAB in the last 72 hours. Anemia Panel:  Recent Labs  10/04/16 0514  VITAMINB12 1,598*  FOLATE 17.3  FERRITIN 323  TIBC 150*  IRON 44*  RETICCTPCT 2.2   Sepsis Labs:  Recent Labs Lab 09/29/16 1741 09/30/16 0322 10/01/16 2335  LATICACIDVEN 2.8* 2.6* 2.3*    Recent Results (from the past 240 hour(s))  Blood culture (routine x 2)     Status: None   Collection Time: 09/29/16  3:20 PM  Result Value Ref Range Status   Specimen Description BLOOD RIGHT ARM  Final   Special Requests BOTTLES DRAWN AEROBIC AND ANAEROBIC 6CC  Final   Culture NO GROWTH 5 DAYS  Final   Report Status 10/04/2016 FINAL  Final  Blood culture (routine x 2)     Status: None   Collection Time: 09/29/16  3:20 PM  Result Value Ref Range Status   Specimen Description BLOOD RIGHT WRIST  Final   Special Requests BOTTLES DRAWN AEROBIC AND ANAEROBIC 6CC  Final   Culture NO GROWTH 5 DAYS  Final   Report Status 10/04/2016 FINAL  Final  Culture, respiratory (NON-Expectorated)     Status: None   Collection Time: 09/29/16  5:28 PM  Result Value Ref Range Status   Specimen Description TRACHEAL ASPIRATE  Final   Special Requests Normal  Final   Gram Stain   Final    MODERATE WBC PRESENT,BOTH PMN AND MONONUCLEAR NO ORGANISMS SEEN    Culture  Final    Consistent with normal respiratory flora. Performed at [facility], [place] [address], [place], [place] [address]    Report Status 10/02/2016 FINAL  Final  MRSA PCR Screening     Status: None   Collection Time: 09/30/16  1:11 AM  Result Value Ref Range Status   MRSA by PCR NEGATIVE NEGATIVE Final    Comment:        The GeneXpert MRSA Assay (FDA approved for NASAL specimens only), is one component of a comprehensive MRSA colonization surveillance  program. It is not intended to diagnose MRSA infection nor to guide or monitor treatment for MRSA infections.   Body fluid culture     Status: None   Collection Time: 09/30/16  2:40 AM  Result Value Ref Range Status   Specimen Description GALL BLADDER  Final   Special Requests NONE  Final   Gram Stain NO WBC SEEN RARE GRAM POSITIVE COCCI IN PAIRS   Final   Culture RARE STAPHYLOCOCCUS SPECIES (COAGULASE NEGATIVE)  Final   Report Status 10/03/2016 FINAL  Final   Organism ID, Bacteria STAPHYLOCOCCUS SPECIES (COAGULASE NEGATIVE)  Final      Susceptibility   Staphylococcus species (coagulase negative) - MIC*    CIPROFLOXACIN >=8 RESISTANT Resistant     ERYTHROMYCIN >=8 RESISTANT Resistant     GENTAMICIN <=0.5 SENSITIVE Sensitive     OXACILLIN >=4 RESISTANT Resistant     TETRACYCLINE 2 SENSITIVE Sensitive     VANCOMYCIN 1 SENSITIVE Sensitive     TRIMETH/SULFA 80 RESISTANT Resistant     CLINDAMYCIN >=8 RESISTANT Resistant     RIFAMPIN <=0.5 SENSITIVE Sensitive     Inducible Clindamycin NEGATIVE Sensitive     * RARE STAPHYLOCOCCUS SPECIES (COAGULASE NEGATIVE)  Body fluid culture     Status: None   Collection Time: 10/02/16  2:23 PM  Result Value Ref Range Status   Specimen Description FLUID RIGHT PLEURAL  Final   Special Requests NONE  Final   Gram Stain   Final    ABUNDANT WBC PRESENT,BOTH PMN AND MONONUCLEAR NO ORGANISMS SEEN    Culture NO GROWTH 3 DAYS  Final   Report Status 10/06/2016 FINAL  Final       Radiology Studies: No results found.    Scheduled Meds: . cefTAZidime (FORTAZ)  IV  2 g Intravenous Q8H  . Chlorhexidine Gluconate Cloth  6 each Topical Daily  . docusate sodium  100 mg Oral BID  . feeding supplement (ENSURE ENLIVE)  237 mL Oral BID BM  . insulin aspart  0-9 Units Subcutaneous Q4H  . mouth rinse  15 mL Mouth Rinse BID  . mirtazapine  30 mg Oral QHS  . sodium chloride flush  10-40 mL Intracatheter Q12H  . vancomycin  1,000 mg Intravenous Q12H  .  Warfarin - Pharmacist Dosing Inpatient   Does not apply q1800   Continuous Infusions: . dextrose 20 mL/hr at 10/06/16 1606  . heparin 1,200 Units/hr (10/06/16 1627)     LOS: 7 days    Time spent: 50 minutes   Dessa Phi, DO [facility] [email or web address] Password [facility] 10/06/2016, 4:48 PM

## 2016-10-07 LAB — CBC
HCT: 31.5 % — ABNORMAL LOW (ref 39.0–52.0)
Hemoglobin: 10 g/dL — ABNORMAL LOW (ref 13.0–17.0)
MCH: 31.8 pg (ref 26.0–34.0)
MCHC: 31.7 g/dL (ref 30.0–36.0)
MCV: 100.3 fL — ABNORMAL HIGH (ref 78.0–100.0)
Platelets: 313 10*3/uL (ref 150–400)
RBC: 3.14 MIL/uL — ABNORMAL LOW (ref 4.22–5.81)
RDW: 13.3 % (ref 11.5–15.5)
WBC: 8.9 10*3/uL (ref 4.0–10.5)

## 2016-10-07 LAB — HEPARIN LEVEL (UNFRACTIONATED)
HEPARIN UNFRACTIONATED: 0.56 [IU]/mL (ref 0.30–0.70)
HEPARIN UNFRACTIONATED: 0.5 [IU]/mL (ref 0.30–0.70)

## 2016-10-07 LAB — GLUCOSE, CAPILLARY
GLUCOSE-CAPILLARY: 119 mg/dL — AB (ref 65–99)
GLUCOSE-CAPILLARY: 121 mg/dL — AB (ref 65–99)
GLUCOSE-CAPILLARY: 144 mg/dL — AB (ref 65–99)
GLUCOSE-CAPILLARY: 146 mg/dL — AB (ref 65–99)
GLUCOSE-CAPILLARY: 150 mg/dL — AB (ref 65–99)
GLUCOSE-CAPILLARY: 156 mg/dL — AB (ref 65–99)
Glucose-Capillary: 131 mg/dL — ABNORMAL HIGH (ref 65–99)

## 2016-10-07 LAB — BASIC METABOLIC PANEL
Anion gap: 7 (ref 5–15)
CALCIUM: 8.6 mg/dL — AB (ref 8.9–10.3)
CO2: 31 mmol/L (ref 22–32)
CREATININE: 0.44 mg/dL — AB (ref 0.61–1.24)
Chloride: 98 mmol/L — ABNORMAL LOW (ref 101–111)
GFR calc non Af Amer: >60 mL/min (ref >60)
Glucose, Bld: 131 mg/dL — ABNORMAL HIGH (ref 65–99)
Potassium: 3 mmol/L — ABNORMAL LOW (ref 3.5–5.1)
SODIUM: 136 mmol/L (ref 135–145)

## 2016-10-07 LAB — MAGNESIUM: Magnesium: 2.1 mg/dL (ref 1.7–2.4)

## 2016-10-07 LAB — VANCOMYCIN, TROUGH: VANCOMYCIN TR: 22 ug/mL — AB (ref 15–20)

## 2016-10-07 LAB — PROTIME-INR
INR: 1.59
PROTHROMBIN TIME: 19.1 s — AB (ref 11.4–15.2)

## 2016-10-07 MED ORDER — POTASSIUM CHLORIDE CRYS ER 20 MEQ PO TBCR
40.0000 meq | EXTENDED_RELEASE_TABLET | ORAL | Status: AC
  Administered 2016-10-07 (×2): 40 meq via ORAL
  Filled 2016-10-07 (×2): qty 2

## 2016-10-07 MED ORDER — WARFARIN SODIUM 2.5 MG PO TABS
2.5000 mg | ORAL_TABLET | Freq: Once | ORAL | Status: AC
  Administered 2016-10-07: 2.5 mg via ORAL
  Filled 2016-10-07: qty 1

## 2016-10-07 MED ORDER — VANCOMYCIN HCL IN DEXTROSE 750-5 MG/150ML-% IV SOLN
750.0000 mg | Freq: Two times a day (BID) | INTRAVENOUS | Status: DC
  Administered 2016-10-07 – 2016-10-09 (×4): 750 mg via INTRAVENOUS
  Filled 2016-10-07 (×5): qty 150

## 2016-10-07 NOTE — Clinical Social Work Note (Signed)
Pt family had meeting with Palliative today. CSW continuing to follow for disposition needs.  [place], [place]

## 2016-10-07 NOTE — Progress Notes (Signed)
PROGRESS NOTE    Stephen Bates  QQI:[id number] DOB: [birth year] DOA: 09/29/2016 PCP: Asencion Noble, MD     Brief Narrative:  Stephen Bates is a 83 year old male with hx of DM, dementia, prostate CA, and CVA who is bedbound. He was admitted 3/14-3/21 to [facility] for cholecystitis treated with IV antibiotics and cystostomy placement. Hospital course complicated by PAF, which resolved spontaneously. He was discharged to home with home health nursing. On 3/26 he returned to the ED after being found unresponsive at home. He remained unresponsive in ED and was intubated for airway protection. He required norepinephrine for shock. CXR was concerning for multifocal PNA. He was transferred to [facility] for ICU care. He was transferred to hospitalist service on 10/03/2016. Unfortunately on imaging he was found to have acute CVA on 10/03/2016.  Significant Events: 3/27 admitted w/ septic shock due to HCAP and on-going cholecystitis. Got additional volume + 3.3 liters. Weaned pressors to off. Considered right thoracentesis but due to agitation and body habitus, held off 3/28 weaned sedating gtts to off. 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[place])   Malnutrition of moderate degree   History of right bundle branch block   Diabetes ([place])   Stroke ([place]) history   HCAP (healthcare-associated pneumonia)   Septic shock ([place])   Acute respiratory failure with hypoxia ([place])   Encephalopathy   Pleural effusion   Acute CVA (cerebrovascular accident) [facility])   Palliative care encounter   Goals of care, counseling/discussion  Septic shock with Acute Hypoxic respiratory failure due to HCAP w/ R>L effusions -Improving clinically -Wean O2 as able -Blood cultures negative  -Continue vancomycin and Fortaz -Repeat CXR worsening in appearance  Recent  acute cholecystitis s/p perc drain placed 3/19 -Perc drain to remain  -Culture with coag neg staph  -Continue vancomycin and Fortaz for now  -Outpatient general surgery follow-up  Newly PAFib -In and out of NSR -Telemetry   Acute CVA -Has expressive aphasia along with mild right-sided weakness -Coumadin probably will be the best option as easily reversible. Heparin gtt to bridge -Stroke team followed and now signed off  Dysphagia -Due to stroke. Speech following, will start DYS 1 with aspiration precautions  Chronic anemia w/out s/sx bleeding -Hgb stable  DM  -SSI -Continue D10 due to poor oral intake   Acute encephalopathy  -Mild chronic dementia, Acute CVA -Continue remeron   Goals of care -Appreciate palliative care. Continue discussion for hospice which is most appropriate given patient clinical status and worsening CXR findings.    DVT prophylaxis: Heparin gtt/Coumadin  Code Status: DNR Family Communication: daughter over the phone 4/2  Disposition Plan: pending continued discussion    Consultants:   PCCM  Neurology  Palliative Care  Antimicrobials:  Anti-infectives    Start     Dose/Rate Route Frequency Ordered Stop   10/07/16 1500  vancomycin (VANCOCIN) IVPB 750 mg/150 ml premix     750 mg 150 mL/hr over 60 Minutes Intravenous Every 12 hours 10/07/16 1103     10/04/16 1000  vancomycin (VANCOCIN) IVPB 1000 mg/200 mL premix  Status:  Discontinued     1,000 mg 200 mL/hr over 60 Minutes Intravenous Every 12 hours 10/04/16 0902 10/07/16 1103   10/03/16 0800  vancomycin (VANCOCIN) IVPB 1000 mg/200 mL premix  Status:  Discontinued  1,000 mg 200 mL/hr over 60 Minutes Intravenous Every 12 hours 10/02/16 1834 10/03/16 0958   10/02/16 2000  vancomycin (VANCOCIN) 500 mg in sodium chloride 0.9 % 100 mL IVPB     500 mg 100 mL/hr over 60 Minutes Intravenous  Once 10/02/16 1834 10/02/16 2133   10/02/16 1700  cefTAZidime (FORTAZ) 2 g in dextrose 5 % 50  mL IVPB     2 g 100 mL/hr over 30 Minutes Intravenous Every 8 hours 10/02/16 1207     09/30/16 0500  vancomycin (VANCOCIN) 500 mg in sodium chloride 0.9 % 100 mL IVPB  Status:  Discontinued     500 mg 100 mL/hr over 60 Minutes Intravenous Every 12 hours 09/30/16 0112 10/02/16 1834   09/30/16 0130  cefTAZidime (FORTAZ) 2 g in dextrose 5 % 50 mL IVPB  Status:  Discontinued     2 g 100 mL/hr over 30 Minutes Intravenous Every 12 hours 09/30/16 0112 10/02/16 1207   09/29/16 1530  piperacillin-tazobactam (ZOSYN) IVPB 3.375 g     3.375 g 100 mL/hr over 30 Minutes Intravenous  Once 09/29/16 1521 09/29/16 1652   09/29/16 1530  vancomycin (VANCOCIN) IVPB 1000 mg/200 mL premix     1,000 mg 200 mL/hr over 60 Minutes Intravenous  Once 09/29/16 1521 09/29/16 1735       Subjective: Patient a little more responsive today, answered few questions yes/no, but fell asleep quickly    Objective: Vitals:   10/07/16 0120 10/07/16 0501 10/07/16 1348 10/07/16 1400  BP:  (!) 124/50 (!) 139/56 130/72  Pulse:   77 76  Resp:  19 16 (!) 24  Temp:  99.2 F (37.3 C) 98.5 F (36.9 C) 98 F (36.7 C)  TempSrc:  Oral Oral Oral  SpO2:   100% 100%  Weight: 66.7 kg (147 lb)     Height:        Intake/Output Summary (Last 24 hours) at 10/07/16 1444 Last data filed at 10/07/16 0600  Gross per 24 hour  Intake           702.33 ml  Output             2400 ml  Net         -1697.67 ml   Filed Weights   10/05/16 0459 10/06/16 0536 10/07/16 0120  Weight: 63.6 kg (140 lb 4.8 oz) 64.5 kg (142 lb 3.2 oz) 66.7 kg (147 lb)    Examination:  General exam: Appears calm and comfortable, more alert today  Respiratory system: Clear to auscultation anteriorly. Coarse bases. Respiratory effort normal. Cardiovascular system: S1 & S2 heard, RRR. No JVD, murmurs, rubs, gallops or clicks. No pedal edema. Gastrointestinal system: Abdomen is nondistended, soft and nontender. No organomegaly or masses felt. Normal bowel sounds  heard. Central nervous system: Doesn't follow directions, answers some questions yes/no  Skin: No rashes, lesions or ulcers on exposed skin   Data Reviewed: I have personally reviewed following labs and imaging studies  CBC:  Recent Labs Lab 10/03/16 0338 10/04/16 0514 10/05/16 0458 10/06/16 0815 10/07/16 0421  WBC 10.8* 9.5 9.7 9.2 8.9  HGB 9.8* 10.0* 9.9* 9.8* 10.0*  HCT 31.2* 31.6* 30.9* 30.5* 31.5*  MCV 101.6* 102.3* 100.7* 99.7 100.3*  PLT 403* 405* 383 345 865   Basic Metabolic Panel:  Recent Labs Lab 09/30/16 1713 10/01/16 0444 10/01/16 1709  10/03/16 7846 10/04/16 0514 10/05/16 0458 10/05/16 1000 10/06/16 0815 10/07/16 0421 10/07/16 0742  NA  --  134*  --   < >  136 134* 132*  --  135 136  --   K  --  4.0  --   < > 3.0* 3.6 2.9*  --  3.3* 3.0*  --   CL  --  102  --   < > 95* 96* 94*  --  98* 98*  --   CO2  --  27  --   < > 35* 31 33*  --  32 31  --   GLUCOSE  --  205*  --   < > 108* 130* 146*  --  122* 131*  --   BUN  --  5*  --   < > 5* <5* <5*  --  <5* <5*  --   CREATININE 0.51* 0.48*  --   < > 0.36* 0.37* 0.35*  --  0.40* 0.44*  --   CALCIUM  --  7.9*  --   < > 7.9* 8.3* 8.4*  --  8.1* 8.6*  --   MG 2.2 1.9 1.9  --  1.7  --   --  1.8  --   --  2.1  PHOS 2.2* 2.2* 1.8*  --  2.6  --   --   --   --   --   --   < > = values in this interval not displayed. GFR: Estimated Creatinine Clearance: 56.6 mL/min (A) (by C-G formula based on SCr of 0.44 mg/dL (L)). Liver Function Tests:  Recent Labs Lab 10/01/16 0444 10/02/16 0300 10/04/16 0514  AST 19 21 20   ALT 14* 13* 15*  ALKPHOS 55 57 67  BILITOT 0.5 0.5 0.5  PROT 5.2* 5.2* 5.6*  ALBUMIN 1.7* 1.7* 1.7*   No results for input(s): LIPASE, AMYLASE in the last 168 hours. No results for input(s): AMMONIA in the last 168 hours. Coagulation Profile:  Recent Labs Lab 10/05/16 0458 10/06/16 0513 10/07/16 0421  INR 1.19 1.34 1.59   Cardiac Enzymes: No results for input(s): CKTOTAL, CKMB, CKMBINDEX,  TROPONINI in the last 168 hours. BNP (last 3 results) No results for input(s): PROBNP in the last 8760 hours. HbA1C: No results for input(s): HGBA1C in the last 72 hours. CBG:  Recent Labs Lab 10/07/16 0026 10/07/16 0415 10/07/16 0811 10/07/16 0931 10/07/16 1157  GLUCAP 156* 121* 131* 119* 146*   Lipid Profile: No results for input(s): CHOL, HDL, LDLCALC, TRIG, CHOLHDL, LDLDIRECT in the last 72 hours. Thyroid Function Tests: No results for input(s): TSH, T4TOTAL, FREET4, T3FREE, THYROIDAB in the last 72 hours. Anemia Panel: No results for input(s): VITAMINB12, FOLATE, FERRITIN, TIBC, IRON, RETICCTPCT in the last 72 hours. Sepsis Labs:  Recent Labs Lab 10/01/16 2335  LATICACIDVEN 2.3*    Recent Results (from the past 240 hour(s))  Blood culture (routine x 2)     Status: None   Collection Time: 09/29/16  3:20 PM  Result Value Ref Range Status   Specimen Description BLOOD RIGHT ARM  Final   Special Requests BOTTLES DRAWN AEROBIC AND ANAEROBIC 6CC  Final   Culture NO GROWTH 5 DAYS  Final   Report Status 10/04/2016 FINAL  Final  Blood culture (routine x 2)     Status: None   Collection Time: 09/29/16  3:20 PM  Result Value Ref Range Status   Specimen Description BLOOD RIGHT WRIST  Final   Special Requests BOTTLES DRAWN AEROBIC AND ANAEROBIC 6CC  Final   Culture NO GROWTH 5 DAYS  Final   Report Status 10/04/2016 FINAL  Final  Culture, respiratory (  NON-Expectorated)     Status: None   Collection Time: 09/29/16  5:28 PM  Result Value Ref Range Status   Specimen Description TRACHEAL ASPIRATE  Final   Special Requests Normal  Final   Gram Stain   Final    MODERATE WBC PRESENT,BOTH PMN AND MONONUCLEAR NO ORGANISMS SEEN    Culture   Final    Consistent with normal respiratory flora. Performed at [facility], [place] [address] [place], [place] [address]    Report Status 10/02/2016 FINAL  Final  MRSA PCR Screening     Status: None   Collection Time: 09/30/16  1:11  AM  Result Value Ref Range Status   MRSA by PCR NEGATIVE NEGATIVE Final    Comment:        The GeneXpert MRSA Assay (FDA approved for NASAL specimens only), is one component of a comprehensive MRSA colonization surveillance program. It is not intended to diagnose MRSA infection nor to guide or monitor treatment for MRSA infections.   Body fluid culture     Status: None   Collection Time: 09/30/16  2:40 AM  Result Value Ref Range Status   Specimen Description GALL BLADDER  Final   Special Requests NONE  Final   Gram Stain NO WBC SEEN RARE GRAM POSITIVE COCCI IN PAIRS   Final   Culture RARE STAPHYLOCOCCUS SPECIES (COAGULASE NEGATIVE)  Final   Report Status 10/03/2016 FINAL  Final   Organism ID, Bacteria STAPHYLOCOCCUS SPECIES (COAGULASE NEGATIVE)  Final      Susceptibility   Staphylococcus species (coagulase negative) - MIC*    CIPROFLOXACIN >=8 RESISTANT Resistant     ERYTHROMYCIN >=8 RESISTANT Resistant     GENTAMICIN <=0.5 SENSITIVE Sensitive     OXACILLIN >=4 RESISTANT Resistant     TETRACYCLINE 2 SENSITIVE Sensitive     VANCOMYCIN 1 SENSITIVE Sensitive     TRIMETH/SULFA 80 RESISTANT Resistant     CLINDAMYCIN >=8 RESISTANT Resistant     RIFAMPIN <=0.5 SENSITIVE Sensitive     Inducible Clindamycin NEGATIVE Sensitive     * RARE STAPHYLOCOCCUS SPECIES (COAGULASE NEGATIVE)  Body fluid culture     Status: None   Collection Time: 10/02/16  2:23 PM  Result Value Ref Range Status   Specimen Description FLUID RIGHT PLEURAL  Final   Special Requests NONE  Final   Gram Stain   Final    ABUNDANT WBC PRESENT,BOTH PMN AND MONONUCLEAR NO ORGANISMS SEEN    Culture NO GROWTH 3 DAYS  Final   Report Status 10/06/2016 FINAL  Final       Radiology Studies: Dg Chest Port 1 View  Result Date: 10/06/2016 CLINICAL DATA:  Persistent cough and congestion. EXAM: PORTABLE CHEST 1 VIEW COMPARISON:  10/03/2016 . FINDINGS: Left PICC line noted with tip at cavoatrial junction. Cardiomegaly.  Bilateral pulmonary infiltrates and pleural effusions noted progressed from prior exam of 10/03/2016. CHF and bilateral pneumonia could present this fashion. Low lung volumes. Drainage catheter noted over the right upper quadrant in stable position. IMPRESSION: 1. Left PICC line noted with tip at cavoatrial junction. Drainage catheter right upper quadrant stable position. 2. Cardiomegaly with bibasilar pulmonary infiltrates/ edema and bilateral pleural effusions. Findings have progressed from prior exam of 10/03/2016. Low lung volumes . Electronically Signed   By: Marcello Moores  Register   On: 10/06/2016 17:19      Scheduled Meds: . cefTAZidime (FORTAZ)  IV  2 g Intravenous Q8H  . Chlorhexidine Gluconate Cloth  6 each Topical Daily  .  docusate sodium  100 mg Oral BID  . feeding supplement (ENSURE ENLIVE)  237 mL Oral BID BM  . insulin aspart  0-9 Units Subcutaneous Q4H  . mouth rinse  15 mL Mouth Rinse BID  . mirtazapine  30 mg Oral QHS  . sodium chloride flush  10-40 mL Intracatheter Q12H  . vancomycin  750 mg Intravenous Q12H  . warfarin  2.5 mg Oral ONCE-1800  . Warfarin - Pharmacist Dosing Inpatient   Does not apply q1800   Continuous Infusions: . dextrose 20 mL/hr at 10/06/16 1606  . heparin 1,200 Units/hr (10/06/16 1627)     LOS: 8 days    Time spent: 30 minutes   Dessa Phi, DO [facility] [email or web address] Password [facility] 10/07/2016, 2:44 PM

## 2016-10-07 NOTE — Progress Notes (Signed)
ANTICOAGULATION CONSULT NOTE   Pharmacy Consult for Heparin  Indication: atrial fibrillation   No Known Allergies  Patient Measurements: Height: 5\' 5"  (165.1 cm) Weight: 142 lb 3.2 oz (64.5 kg) IBW/kg (Calculated) : 61.5   Vital Signs: Temp: 98.6 F (37 C) (04/02 2018) Temp Source: Oral (04/02 2018) BP: 131/67 (04/02 2018) Pulse Rate: 80 (04/02 2018)  Labs:  Recent Labs  10/04/16 0514  10/05/16 0458  10/06/16 0513 10/06/16 0815 10/06/16 1422 10/06/16 2339  HGB 10.0*  --  9.9*  --   --  9.8*  --   --   HCT 31.6*  --  30.9*  --   --  30.5*  --   --   PLT 405*  --  383  --   --  345  --   --   LABPROT  --   --  15.2  --  16.7*  --   --   --   INR  --   --  1.19  --  1.34  --   --   --   HEPARINUNFRC  --   < >  --   < > 0.18*  --  0.24* 0.56  CREATININE 0.37*  --  0.35*  --   --  0.40*  --   --   < > = values in this interval not displayed.  Estimated Creatinine Clearance: 56.6 mL/min (A) (by C-G formula based on SCr of 0.4 mg/dL (L)).   Assessment: 83 yo male admitted and found to have atrial fibrillation and acute CVA for heparin.  Heparin level slightly above goal tonight  Goal of Therapy:  Heparin level goal 0.3-0.5 Monitor platelets by anticoagulation protocol: Yes   Plan:  Will continue heparin at current rate for now, recheck level with am labs.  If remains above goal at that time, will adjust downward.   Phillis Knack, PharmD, BCPS

## 2016-10-07 NOTE — Progress Notes (Addendum)
Daily Progress Note   Patient Name: Stephen Bates       Date: 10/07/2016 DOB: 11-07-1926  Age: 83 y.o. MRN#: [id number] Attending Physician: Shon Millet* Primary Care Physician: Asencion Noble, MD Admit Date: 09/29/2016  Reason for Consultation/Follow-up: Establishing goals of care and Hospice Evaluation d/t new stroke and declining functional status.   Subjective: Stephen Bates has no complaints. He is more alert and communicative.   Length of Stay: 8  Current Medications: Scheduled Meds:  . cefTAZidime (FORTAZ)  IV  2 g Intravenous Q8H  . Chlorhexidine Gluconate Cloth  6 each Topical Daily  . docusate sodium  100 mg Oral BID  . feeding supplement (ENSURE ENLIVE)  237 mL Oral BID BM  . insulin aspart  0-9 Units Subcutaneous Q4H  . mouth rinse  15 mL Mouth Rinse BID  . mirtazapine  30 mg Oral QHS  . sodium chloride flush  10-40 mL Intracatheter Q12H  . vancomycin  750 mg Intravenous Q12H  . warfarin  2.5 mg Oral ONCE-1800  . Warfarin - Pharmacist Dosing Inpatient   Does not apply q1800    Continuous Infusions: . dextrose 20 mL/hr at 10/06/16 1606  . heparin 1,200 Units/hr (10/06/16 1627)    PRN Meds: acetaminophen, polyethylene glycol, sodium chloride flush  Physical Exam  Constitutional:  Thin, frail  HENT:  Dressing on head CDI  Cardiovascular: Normal rate.  An irregular rhythm present.  Pulmonary/Chest: He has decreased breath sounds. He has rhonchi.  Abdominal:  Multiple skin tears, perc chole draining to gravity drainage is bilious  Neurological: He is alert.  Able to speak more and appropriate responses at times. Repetitive and difficult to understand at times.   Nursing note and vitals reviewed.           Vital Signs: BP (!) 139/56 (BP Location: Right  Arm)   Pulse 77   Temp 98.5 F (36.9 C) (Oral)   Resp 16   Ht 5' 5"  (1.651 m)   Wt 66.7 kg (147 lb)   SpO2 100%   BMI 24.46 kg/m  SpO2: SpO2: 100 % O2 Device: O2 Device: Nasal Cannula O2 Flow Rate: O2 Flow Rate (L/min): 2 L/min  Intake/output summary:   Intake/Output Summary (Last 24 hours) at 10/07/16 1356 Last data filed at 10/07/16 0600  Gross per 24  hour  Intake           752.33 ml  Output             2400 ml  Net         -1647.67 ml   LBM: Last BM Date: 10/04/16 Baseline Weight: Weight: 61.2 kg (134 lb 14.7 oz) Most recent weight: Weight: 66.7 kg (147 lb)       Palliative Assessment/Data:    Flowsheet Rows     Most Recent Value  Intake Tab  Referral Department  Hospitalist  Unit at Time of Referral  Med/Surg Unit  Palliative Care Primary Diagnosis  Neurology  Date Notified  10/04/16  Palliative Care Type  New Palliative care  Reason for referral  Clarify Goals of Care, Counsel Regarding Hospice  Date of Admission  09/29/16  Date first seen by Palliative Care  10/05/16  # of days Palliative referral response time  1 Day(s)  # of days IP prior to Palliative referral  5  Clinical Assessment  Palliative Performance Scale Score  30%  Pain Max last 24 hours  Not able to report  Pain Min Last 24 hours  Not able to report  Dyspnea Max Last 24 Hours  Not able to report  Dyspnea Min Last 24 hours  Not able to report  Nausea Max Last 24 Hours  Not able to report  Nausea Min Last 24 Hours  Not able to report  Anxiety Max Last 24 Hours  Not able to report  Anxiety Min Last 24 Hours  Not able to report  Other Max Last 24 Hours  Not able to report  Psychosocial & Spiritual Assessment  Palliative Care Outcomes  Patient/Family meeting held?  Yes  Who was at the meeting?  dtr [place] via phone. Trying to arrange mtg with both daughters and their mother who is also in the hospital  [place] regarding hospice, Provided psychosocial or spiritual  support  Patient/Family wishes: Interventions discontinued/not started   Mechanical Ventilation, Vasopressors, Trach  Palliative Care follow-up planned  Yes, Facility      Patient Active Problem List   Diagnosis Date Noted  . Goals of care, counseling/discussion   . Acute CVA (cerebrovascular accident) ([place])   . Palliative care encounter   . Encephalopathy   . Pleural effusion   . Acute respiratory failure with hypoxia ([place]) 09/30/2016  . Severe sepsis with septic shock ([place]) 09/29/2016  . HCAP (healthcare-associated pneumonia) 09/29/2016  . Septic shock ([place]) 09/29/2016  . Pressure injury of skin 09/18/2016  . History of right bundle branch block   . Diabetes ([place])   . Cholecystitis 08/24/2015  . Acute cholecystitis 08/24/2015  . Malnutrition of moderate degree 05/24/2015  . Pressure ulcer 05/23/2015  . RUQ abdominal pain 05/22/2015  . 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 CAP (community acquired pneumonia) 01/05/2014  . Hyponatremia 01/03/2014  . Ileus ([place]) 01/03/2014  . Dehydration, mild 01/03/2014  . Mild dementia 11/29/2013  . Parkinsonism ([place]) 11/29/2013  . Gait difficulty 11/29/2013  . Neuropathy ([place]) 11/29/2013  . Memory loss 11/29/2013  . Stroke [facility]) history 07/07/1980    Palliative Care Assessment & Plan   HPI: 83 y.o. male  with past medical history of Diabetes, dementia , prostate cancer, CVA, admitted on 09/29/2016 after being found unresponsive at home. He remained unresponsive in the emergency room and was intubated for airway protection. He required norepinephrine for shock. Chest x-ray concerning for multifocal pneumonia. He was transferred  to the ICU then subsequently  stabilized and transferred to the hospitalist service. Imaging reflects the patient has had a new acute CVA. Patient was recently [facility] to [facility] from 09/17/2016 to 09/24/2016 with acute cholecystitis. He now has a cholecystostomy drain. Remains with poor appetite and  intake.   Assessment: I met today with Stephen Bates, Stephen Bates and Stephen Bates (ED physician), and Stephen Stephen Bates was on the phone. We had a long discussion regarding Stephen Bates history and current condition. He was nonambulatory but very functional otherwise (feed self, manage properties, help turn self, basically everything but walk). He was very fearful or falling. He has greatly declined since his cholecystitis per family.   He is improved cognitively today but barriers are still aspiration and poor intake. They understand. We discussed feeding tube, hospice vs palliative care, and GOC. Main goal is that he will stay at home and not go to SNF. He also wants to die at home. Encouraged them to continue considering their thoughts on feeding tube (we discussed risks vs limited benefit) as well as hospice. They seem inclined towards hospice but may desire feeding tube. I will continue to follow and discuss with family. I thanked them for taking time to meet with me and I hope to keep them updated on his status - barriers or improvement.   Recommendations/Plan: Likely aspiration: Consider Robinul IV 0.2 every 4 hours prn if needed. Does not appear to need this at this time - is comfortable. CXR and lungs sounds are worse today.   Goals of Care and Additional Recommendations:  Limitations on Scope of Treatment: Undetermined  Code Status:  DNR confirmed  Prognosis:   < 6 months very likely, likely less with continued poor appetite and no feeding tube  Discharge Planning:  To Be Determined   Thank you for allowing the Palliative Medicine Team to assist in the care of this patient.   Total Time 57mn 1250-1400 Prolonged Time Billed  yes       Greater than 50%  of this time was spent counseling and coordinating care related to the above assessment and plan.  AVinie Sill NP Palliative Medicine Team Pager # [phone](M-F 8a-5p) Team Phone # [phone] (Nights/Weekends)

## 2016-10-07 NOTE — Progress Notes (Signed)
[facility] for Heparin and warfarin  Indication: atrial fibrillation and acute CVA  No Known Allergies  Patient Measurements: Height: 5\' 5"  (165.1 cm) Weight: 147 lb (66.7 kg) IBW/kg (Calculated) : 61.5   Vital Signs: Temp: 99.2 F (37.3 C) (04/03 0501) Temp Source: Oral (04/03 0501) BP: 124/50 (04/03 0501)  Labs:  Recent Labs  10/05/16 0458  10/06/16 0513 10/06/16 0815 10/06/16 1422 10/06/16 2339 10/07/16 0421  HGB 9.9*  --   --  9.8*  --   --  10.0*  HCT 30.9*  --   --  30.5*  --   --  31.5*  PLT 383  --   --  345  --   --  313  LABPROT 15.2  --  16.7*  --   --   --  19.1*  INR 1.19  --  1.34  --   --   --  1.59  HEPARINUNFRC  --   < > 0.18*  --  0.24* 0.56 0.50  CREATININE 0.35*  --   --  0.40*  --   --  0.44*  < > = values in this interval not displayed.  Estimated Creatinine Clearance: 56.6 mL/min (A) (by C-G formula based on SCr of 0.44 mg/dL (L)).   Medical History: Past Medical History:  Diagnosis Date  . Arthritis   . Benign neoplasm of colon   . Chronic back pain   . Diabetes ([place])   . Frequent falls 2012  . History of right bundle branch block   . Hypertension   . Lumbar radiculopathy   . Mild dementia   . Neuropathy ([place])   . Prostate cancer ([place])   . Stroke ([place]) 1982  . Weakness of both legs   . Wears glasses     Assessment: 83 yo male admitted and found to have atrial fibrillation and acute CVA. Heparin level is therapeutic this morning at 0.5, desired range of 0.3 - 0.5. INR trending up but remains subtherapeutic at 1.59. No bleeding noted. Patient not eating much and was NPO several days this past week. Palliative to meet with family today.  Goal of Therapy:  Heparin level goal 0.3-0.5 INR 2-3 Monitor platelets by anticoagulation protocol: Yes   Plan:  1. Continue heparin infusion at 1200 units/hr 2. Daily heparin level 3. Repeat warfarin 2.5 mg x 1 this evening  4. Monitor daily INR, CBC,  clinical course, s/sx of bleed, PO intake, DDI   Thank you for allowing [place] to participate in this patients care.  Jens Som, PharmD Clinical phone for 10/07/2016 from 7a-3:30p: [phone] If after 3:30p, please call main pharmacy at: [phone] 10/07/2016 8:46 AM

## 2016-10-07 NOTE — Progress Notes (Signed)
Pharmacy Antibiotic Note  Stephen Bates is a 83 y.o. male admitted on 09/29/2016 from [facility] ED with HCAP. (Noted pt on Zosyn from 3/14-3/20 at [facility] for acute cholecystitis). Pharmacy consulted for vanc/ceftaz dosing.  Today is day 9 of therapy. Afebrile, wbc wnl. Culture from gallbladder is growing rare MRSE.  4/2 Chest xray is noted to have bibasilar pulmonary infiltrates/ edema and bilateral pleural effusions with progression from 3/30 films.  Vancomycin trough is supra-therapeutic today at 22 (goal 15-20). SCr stable  Plan: Continue Ceftazidime 2gm IV q8h Reduce vancomycin to 750 mg IV q12h Will f/u micro data, renal function, pt's clinical condition, and vancomycin levels as appropriate   Height: 5\' 5"  (165.1 cm) Weight: 147 lb (66.7 kg) IBW/kg (Calculated) : 61.5  Temp (24hrs), Avg:99 F (37.2 C), Min:98.6 F (37 C), Max:99.2 F (37.3 C)   Recent Labs Lab 10/01/16 2335  10/02/16 1630 10/03/16 0338 10/04/16 0514 10/05/16 0458 10/06/16 0815 10/07/16 0421  WBC  --   < >  --  10.8* 9.5 9.7 9.2 8.9  CREATININE  --   < >  --  0.36* 0.37* 0.35* 0.40* 0.44*  LATICACIDVEN 2.3*  --   --   --   --   --   --   --   VANCOTROUGH  --   --  8*  --   --   --   --   --   < > = values in this interval not displayed.  Estimated Creatinine Clearance: 56.6 mL/min (A) (by C-G formula based on SCr of 0.44 mg/dL (L)).    No Known Allergies   3/29 vT 8>>inc to 1g q12  4/3   vT 22>> decrease to 750mg  q 12h  3/26 Zosyn x 1 3/26 Vanc >> 3/30 3/27 Fortaz >>  3/26 BCx x2: ngf 3/26 Trach asp: ngtd 3/27 gallbladder fluid: rare CoNS 3/27 MRSA PCR negative   Thank you for allowing [place] to participate in this patients care.  Jens Som, PharmD Clinical phone for 10/07/2016 from 7a-3:30p: [phone] If after 3:30p, please call main pharmacy at: [phone] 10/07/2016 9:05 AM

## 2016-10-07 NOTE — Progress Notes (Signed)
  Speech Language Pathology Treatment: Dysphagia  Patient Details Name: Stephen Bates MRN: [id number] DOB: 03-10-1927 Today's Date: 10/07/2016 Time: 5956-3875 SLP Time Calculation (min) (ACUTE ONLY): 15 min  Assessment / Plan / Recommendation Clinical Impression  No family present for dysphagia treatment during breakfast. SLP repositioned upright in bed. Pt awake and intermittently responsive with head gestures however keeps eyes closed. Pt required feeding assist via SLP was accepting of thin liquids and yogurt. He coughed x 1 throughout 15 minute session although feel he is likely intermittently aspirating. Noted Palliative meeting planned for this afternoon. Current plan is Dys 1 (puree), thin and cease po's if pt refuses or frequent and consistent coughing that is uncomfortable. Will follow.    HPI HPI: 83 year old male with PMH significant for DM, bedbound, dementia, prostate Ca, and CVA. He was also recently admitted 3/14 >3/21 to [facility] for cholecystitis treated with IV abx and cystostomy placement. Hospital course complicated by PAF, which resolved spontaneously. He was discharged to home with home health nursing. 3/26 he presented to ED after being found unresponsive at home. He remained unresponsive in ED and was intubated for airway protection. [facility] was transferred to [facility] care.  ETT 3/26-3/29.  Dx acute hypoxic resp failure in setting of HCAP, septic shock - resolved.  Changes in behavior with weakness, gaze preference; CT 3/30 Acute versus subacute nonhemorrhagic left parietal lobe (left MCA      SLP Plan  Continue with current plan of care       Recommendations  Diet recommendations: Dysphagia 1 (puree);Thin liquid Liquids provided via: Cup;Straw Medication Administration: Crushed with puree Supervision: Staff to assist with self feeding;Full supervision/cueing for compensatory strategies Compensations: Minimize environmental distractions;Slow rate;Small  sips/bites Postural Changes and/or Swallow Maneuvers: Seated upright 90 degrees                Oral Care Recommendations: Oral care BID SLP Visit Diagnosis: Dysphagia, unspecified (R13.10) Plan: Continue with current plan of care       GO                Houston Siren 10/07/2016, 9:40 AM  Orbie Pyo Colvin Caroli.Ed [employer] [phone]

## 2016-10-08 DIAGNOSIS — Z4659 Encounter for fitting and adjustment of other gastrointestinal appliance and device: Secondary | ICD-10-CM

## 2016-10-08 DIAGNOSIS — E440 Moderate protein-calorie malnutrition: Secondary | ICD-10-CM

## 2016-10-08 DIAGNOSIS — F0390 Unspecified dementia without behavioral disturbance: Secondary | ICD-10-CM

## 2016-10-08 DIAGNOSIS — R630 Anorexia: Secondary | ICD-10-CM

## 2016-10-08 DIAGNOSIS — Z9889 Other specified postprocedural states: Secondary | ICD-10-CM

## 2016-10-08 DIAGNOSIS — G214 Vascular parkinsonism: Secondary | ICD-10-CM

## 2016-10-08 LAB — BASIC METABOLIC PANEL
Anion gap: 8 (ref 5–15)
CALCIUM: 8.8 mg/dL — AB (ref 8.9–10.3)
CO2: 30 mmol/L (ref 22–32)
Chloride: 100 mmol/L — ABNORMAL LOW (ref 101–111)
Creatinine, Ser: 0.51 mg/dL — ABNORMAL LOW (ref 0.61–1.24)
GFR calc Af Amer: >60 mL/min (ref >60)
GLUCOSE: 176 mg/dL — AB (ref 65–99)
Potassium: 2.9 mmol/L — ABNORMAL LOW (ref 3.5–5.1)
Sodium: 138 mmol/L (ref 135–145)

## 2016-10-08 LAB — CBC
HCT: 33.4 % — ABNORMAL LOW (ref 39.0–52.0)
Hemoglobin: 10.6 g/dL — ABNORMAL LOW (ref 13.0–17.0)
MCH: 31.9 pg (ref 26.0–34.0)
MCHC: 31.7 g/dL (ref 30.0–36.0)
MCV: 100.6 fL — ABNORMAL HIGH (ref 78.0–100.0)
Platelets: 343 10*3/uL (ref 150–400)
RBC: 3.32 MIL/uL — ABNORMAL LOW (ref 4.22–5.81)
RDW: 13.4 % (ref 11.5–15.5)
WBC: 10.7 10*3/uL — ABNORMAL HIGH (ref 4.0–10.5)

## 2016-10-08 LAB — PROTIME-INR
INR: 1.79
PROTHROMBIN TIME: 21 s — AB (ref 11.4–15.2)

## 2016-10-08 LAB — GLUCOSE, CAPILLARY
Glucose-Capillary: 108 mg/dL — ABNORMAL HIGH (ref 65–99)
Glucose-Capillary: 126 mg/dL — ABNORMAL HIGH (ref 65–99)
Glucose-Capillary: 127 mg/dL — ABNORMAL HIGH (ref 65–99)
Glucose-Capillary: 135 mg/dL — ABNORMAL HIGH (ref 65–99)
Glucose-Capillary: 168 mg/dL — ABNORMAL HIGH (ref 65–99)
Glucose-Capillary: 168 mg/dL — ABNORMAL HIGH (ref 65–99)

## 2016-10-08 LAB — HEPARIN LEVEL (UNFRACTIONATED): Heparin Unfractionated: 0.39 IU/mL (ref 0.30–0.70)

## 2016-10-08 MED ORDER — POTASSIUM CHLORIDE CRYS ER 20 MEQ PO TBCR
40.0000 meq | EXTENDED_RELEASE_TABLET | ORAL | Status: AC
  Administered 2016-10-08 (×2): 40 meq via ORAL
  Filled 2016-10-08 (×2): qty 2

## 2016-10-08 MED ORDER — WARFARIN SODIUM 2.5 MG PO TABS
2.5000 mg | ORAL_TABLET | Freq: Once | ORAL | Status: AC
  Administered 2016-10-08: 2.5 mg via ORAL
  Filled 2016-10-08: qty 1

## 2016-10-08 MED ORDER — SENNA 8.6 MG PO TABS
1.0000 | ORAL_TABLET | Freq: Every day | ORAL | Status: DC
  Administered 2016-10-09: 8.6 mg via ORAL
  Filled 2016-10-08: qty 1

## 2016-10-08 MED ORDER — DEXTROSE 10 % IV SOLN
INTRAVENOUS | Status: DC
  Administered 2016-10-08 – 2016-10-12 (×3): via INTRAVENOUS

## 2016-10-08 NOTE — Progress Notes (Signed)
  Speech Language Pathology Treatment: Dysphagia  Patient Details Name: Stephen Bates MRN: [id number] DOB: 22-Jun-1927 Today's Date: 10/08/2016 Time: 1510-1530 SLP Time Calculation (min) (ACUTE ONLY): 20 min  Assessment / Plan / Recommendation Clinical Impression  Pt with marginal improvements.  His brother, sister-in-law, and niece were present today; appeared to be attempting to communicate more readily with his brother, but speech is not intelligible secondary to aphasia.  Educated family re: nature of aphasia and how it is impacting pt's communication.  Mr. Semel was willing to drink limited sips of Ensure, which he tolerated well without overt s/s of aspiration, but then adamantly refused further POs. Motivation to eat is a greater barrier than his ability to safely eat at this time.  SLP will continue to follow along; [place] with palliative medicine are pending.    HPI HPI: 83 year old male with PMH significant for DM, bedbound, dementia, prostate Ca, and CVA. He was also recently admitted 3/14 >3/21 to [facility] for cholecystitis treated with IV abx and cystostomy placement. Hospital course complicated by PAF, which resolved spontaneously. He was discharged to home with home health nursing. 3/26 he presented to ED after being found unresponsive at home. He remained unresponsive in ED and was intubated for airway protection. He was transferred [facility] care.  ETT 3/26-3/29.  Dx acute hypoxic resp failure in setting of HCAP, septic shock - resolved.  Changes in behavior with weakness, gaze preference; CT 3/30 Acute versus subacute nonhemorrhagic left parietal lobe (left MCA      SLP Plan  Continue with current plan of care       Recommendations  Diet recommendations: Dysphagia 1 (puree);Thin liquid Liquids provided via: Cup;Straw Medication Administration: Crushed with puree Supervision: Staff to assist with self feeding;Full supervision/cueing for compensatory  strategies Compensations: Minimize environmental distractions;Slow rate;Small sips/bites Postural Changes and/or Swallow Maneuvers: Seated upright 90 degrees                Oral Care Recommendations: Oral care BID SLP Visit Diagnosis: Dysphagia, unspecified (R13.10) Plan: Continue with current plan of care       GO               Nazariah Cadet L. Tivis Ringer, [place] CCC/SLP Pager [phone]  [name] 10/08/2016, 3:41 PM

## 2016-10-08 NOTE — Progress Notes (Signed)
[place] for Heparin and warfarin  Indication: atrial fibrillation and acute CVA  No Known Allergies  Patient Measurements: Height: 5\' 5"  (165.1 cm) Weight: 135 lb 2.3 oz (61.3 kg) IBW/kg (Calculated) : 61.5   Vital Signs: Temp: 97.3 F (36.3 C) (04/04 1316) BP: 163/72 (04/04 1316) Pulse Rate: 75 (04/04 1316)  Labs:  Recent Labs  10/06/16 0513  10/06/16 0815  10/07/16 0421 10/08/16 0603 10/08/16 1228  HGB  --   < > 9.8*  --  10.0* 10.6*  --   HCT  --   --  30.5*  --  31.5* 33.4*  --   PLT  --   --  345  --  313 343  --   LABPROT 16.7*  --   --   --  19.1* 21.0*  --   INR 1.34  --   --   --  1.59 1.79  --   HEPARINUNFRC 0.18*  --   --   < > 0.50 <0.10* 0.39  CREATININE  --   --  0.40*  --  0.44* 0.51*  --   < > = values in this interval not displayed.  Estimated Creatinine Clearance: 56.4 mL/min (A) (by C-G formula based on SCr of 0.51 mg/dL (L)).   Medical History: Past Medical History:  Diagnosis Date  . Arthritis   . Benign neoplasm of colon   . Chronic back pain   . Diabetes ([place])   . Frequent falls 2012  . History of right bundle branch block   . Hypertension   . Lumbar radiculopathy   . Mild dementia   . Neuropathy ([place])   . Prostate cancer ([place])   . Stroke ([place]) 1982  . Weakness of both legs   . Wears glasses     Assessment: 83 yo male admitted and found to have atrial fibrillation and acute CVA. Heparin level this AM was undetectable but IV was out overnight near time of lab draw.  Repeat heparin level now therapeutic on 1200 units/hr. INR trending up but remains subtherapeutic at 1.79. No bleeding noted.   Goal of Therapy:  Heparin level goal 0.3-0.5 INR 2-3 Monitor platelets by anticoagulation protocol: Yes   Plan:  1. Continue heparin infusion at 1200 units/hr 2. Daily heparin level 3. Repeat warfarin 2.5 mg x 1 this evening  4. Monitor daily INR, CBC, clinical course, s/sx of bleed, PO intake,  DDI   Thank you for allowing [place] to participate in this patients care.  [employer], Pharm.D., BCPS Clinical Pharmacist Pager [phone] 10/08/2016 1:41 PM

## 2016-10-08 NOTE — Progress Notes (Signed)
PROGRESS NOTE    Stephen Bates  PZW:[id number] DOB: October 26, 1926 DOA: 09/29/2016 PCP: Asencion Noble, MD   Brief Narrative: Stephen Bates is a 83 y.o. male with hx of DM, dementia, prostate CA, and CVA who is bedbound. He presented after being found unresponsive requiring intubation and found to have another acute CVA. Goals of care discussions.   Assessment & Plan:   Principal Problem:   Severe sepsis with septic shock (HCC) Active Problems:   Mild dementia   Vascular parkinsonism (HCC)   Malnutrition of moderate degree   History of right bundle branch block   Diabetes ([place])   Stroke ([place]) history   HCAP (healthcare-associated pneumonia)   Septic shock (HCC)   Acute respiratory failure with hypoxia (HCC)   Encephalopathy   Pleural effusion   Acute CVA (cerebrovascular accident) [facility])   Palliative care encounter   Goals of care, counseling/discussion   Septic shock with acute hypoxic respiratory failure secondary to HCAP Weaned off of oxygen -continue Vancomycin and Fortaz -repeat CXR in AM  History of percutaneous drain s/p acute cholecystitis -outpatient general surgery follow-up  Paroxysmal atrial fibrillation New. -Coumadin bridge with heparin  Acute CVA Expressive aphasia. Stroke team signed off. -coumadin as above  Dysphagia Secondary to stroke. -dysphagia 1 diet  Chronic anemia Hemoglobin stable.  Diabetes mellitus -continue SSI  Acute encephalopathy Dementia Acute CVA likely contributing. -continue Remeron  Goals of care Palliative care in discussions with family. Considering feeding tube vs comfort care  DVT prophylaxis: Heparin drip/coumadin Code Status: DNR Family Communication: None at bedside Disposition Plan: Discharge pending continued discussions with family for goals of care   Consultants:   Palliative care medicine  Neurology  PCCM  Procedures:   None  Antimicrobials:  Vancomycin    Subjective: Patient not able  to communicate with me. When asked about pain or discomfort, he gives me stares.  Objective: Vitals:   10/07/16 2107 10/08/16 0454 10/08/16 0700 10/08/16 1316  BP: (!) 148/70 (!) 149/64  (!) 163/72  Pulse: 86 86  75  Resp: 18 18  18   Temp: 98.9 F (37.2 C) 97.6 F (36.4 C)  97.3 F (36.3 C)  TempSrc:      SpO2: 93% 93%  91%  Weight:   61.3 kg (135 lb 2.3 oz)   Height:        Intake/Output Summary (Last 24 hours) at 10/08/16 1445 Last data filed at 10/08/16 1242  Gross per 24 hour  Intake           737.27 ml  Output             2380 ml  Net         -1642.73 ml   Filed Weights   10/06/16 0536 10/07/16 0120 10/08/16 0700  Weight: 64.5 kg (142 lb 3.2 oz) 66.7 kg (147 lb) 61.3 kg (135 lb 2.3 oz)    Examination:  General exam: Appears calm and comfortable Respiratory system: Clear to auscultation. Respiratory effort normal. Cardiovascular system: S1 & S2 heard, RRR. No murmurs, rubs, gallops or clicks. Gastrointestinal system: Abdomen is nondistended, soft and nontender. No organomegaly or masses felt. Normal bowel sounds heard. Central nervous system: Alert and oriented. No focal neurological deficits. Extremities: No edema. No calf tenderness Skin: No cyanosis. No rashes Psychiatry: Judgement and insight appear normal. Mood & affect appropriate.     Data Reviewed: I have personally reviewed following labs and imaging studies  CBC:  Recent Labs Lab 10/04/16 0514 10/05/16 0458  10/06/16 0815 10/07/16 0421 10/08/16 0603  WBC 9.5 9.7 9.2 8.9 10.7*  HGB 10.0* 9.9* 9.8* 10.0* 10.6*  HCT 31.6* 30.9* 30.5* 31.5* 33.4*  MCV 102.3* 100.7* 99.7 100.3* 100.6*  PLT 405* 383 345 313 378   Basic Metabolic Panel:  Recent Labs Lab 10/01/16 1709  10/03/16 0338 10/04/16 0514 10/05/16 0458 10/05/16 1000 10/06/16 0815 10/07/16 0421 10/07/16 0742 10/08/16 0603  NA  --   < > 136 134* 132*  --  135 136  --  138  K  --   < > 3.0* 3.6 2.9*  --  3.3* 3.0*  --  2.9*  CL  --    < > 95* 96* 94*  --  98* 98*  --  100*  CO2  --   < > 35* 31 33*  --  32 31  --  30  GLUCOSE  --   < > 108* 130* 146*  --  122* 131*  --  176*  BUN  --   < > 5* <5* <5*  --  <5* <5*  --  <5*  CREATININE  --   < > 0.36* 0.37* 0.35*  --  0.40* 0.44*  --  0.51*  CALCIUM  --   < > 7.9* 8.3* 8.4*  --  8.1* 8.6*  --  8.8*  MG 1.9  --  1.7  --   --  1.8  --   --  2.1  --   PHOS 1.8*  --  2.6  --   --   --   --   --   --   --   < > = values in this interval not displayed. GFR: Estimated Creatinine Clearance: 56.4 mL/min (A) (by C-G formula based on SCr of 0.51 mg/dL (L)). Liver Function Tests:  Recent Labs Lab 10/02/16 0300 10/04/16 0514  AST 21 20  ALT 13* 15*  ALKPHOS 57 67  BILITOT 0.5 0.5  PROT 5.2* 5.6*  ALBUMIN 1.7* 1.7*   No results for input(s): LIPASE, AMYLASE in the last 168 hours. No results for input(s): AMMONIA in the last 168 hours. Coagulation Profile:  Recent Labs Lab 10/05/16 0458 10/06/16 0513 10/07/16 0421 10/08/16 0603  INR 1.19 1.34 1.59 1.79   Cardiac Enzymes: No results for input(s): CKTOTAL, CKMB, CKMBINDEX, TROPONINI in the last 168 hours. BNP (last 3 results) No results for input(s): PROBNP in the last 8760 hours. HbA1C: No results for input(s): HGBA1C in the last 72 hours. CBG:  Recent Labs Lab 10/07/16 2010 10/08/16 0036 10/08/16 0451 10/08/16 0754 10/08/16 1157  GLUCAP 150* 126* 168* 127* 108*   Lipid Profile: No results for input(s): CHOL, HDL, LDLCALC, TRIG, CHOLHDL, LDLDIRECT in the last 72 hours. Thyroid Function Tests: No results for input(s): TSH, T4TOTAL, FREET4, T3FREE, THYROIDAB in the last 72 hours. Anemia Panel: No results for input(s): VITAMINB12, FOLATE, FERRITIN, TIBC, IRON, RETICCTPCT in the last 72 hours. Sepsis Labs:  Recent Labs Lab 10/01/16 2335  LATICACIDVEN 2.3*    Recent Results (from the past 240 hour(s))  Blood culture (routine x 2)     Status: None   Collection Time: 09/29/16  3:20 PM  Result Value  Ref Range Status   Specimen Description BLOOD RIGHT ARM  Final   Special Requests BOTTLES DRAWN AEROBIC AND ANAEROBIC 6CC  Final   Culture NO GROWTH 5 DAYS  Final   Report Status 10/04/2016 FINAL  Final  Blood culture (routine x 2)  Status: None   Collection Time: 09/29/16  3:20 PM  Result Value Ref Range Status   Specimen Description BLOOD RIGHT WRIST  Final   Special Requests BOTTLES DRAWN AEROBIC AND ANAEROBIC 6CC  Final   Culture NO GROWTH 5 DAYS  Final   Report Status 10/04/2016 FINAL  Final  Culture, respiratory (NON-Expectorated)     Status: None   Collection Time: 09/29/16  5:28 PM  Result Value Ref Range Status   Specimen Description TRACHEAL ASPIRATE  Final   Special Requests Normal  Final   Gram Stain   Final    MODERATE WBC PRESENT,BOTH PMN AND MONONUCLEAR NO ORGANISMS SEEN    Culture   Final    Consistent with normal respiratory flora. Performed at [facility], [place] [address], [place], [place] [address]    Report Status 10/02/2016 FINAL  Final  MRSA PCR Screening     Status: None   Collection Time: 09/30/16  1:11 AM  Result Value Ref Range Status   MRSA by PCR NEGATIVE NEGATIVE Final    Comment:        The GeneXpert MRSA Assay (FDA approved for NASAL specimens only), is one component of a comprehensive MRSA colonization surveillance program. It is not intended to diagnose MRSA infection nor to guide or monitor treatment for MRSA infections.   Body fluid culture     Status: None   Collection Time: 09/30/16  2:40 AM  Result Value Ref Range Status   Specimen Description GALL BLADDER  Final   Special Requests NONE  Final   Gram Stain NO WBC SEEN RARE GRAM POSITIVE COCCI IN PAIRS   Final   Culture RARE STAPHYLOCOCCUS SPECIES (COAGULASE NEGATIVE)  Final   Report Status 10/03/2016 FINAL  Final   Organism ID, Bacteria STAPHYLOCOCCUS SPECIES (COAGULASE NEGATIVE)  Final      Susceptibility   Staphylococcus species (coagulase negative) - MIC*     CIPROFLOXACIN >=8 RESISTANT Resistant     ERYTHROMYCIN >=8 RESISTANT Resistant     GENTAMICIN <=0.5 SENSITIVE Sensitive     OXACILLIN >=4 RESISTANT Resistant     TETRACYCLINE 2 SENSITIVE Sensitive     VANCOMYCIN 1 SENSITIVE Sensitive     TRIMETH/SULFA 80 RESISTANT Resistant     CLINDAMYCIN >=8 RESISTANT Resistant     RIFAMPIN <=0.5 SENSITIVE Sensitive     Inducible Clindamycin NEGATIVE Sensitive     * RARE STAPHYLOCOCCUS SPECIES (COAGULASE NEGATIVE)  Body fluid culture     Status: None   Collection Time: 10/02/16  2:23 PM  Result Value Ref Range Status   Specimen Description FLUID RIGHT PLEURAL  Final   Special Requests NONE  Final   Gram Stain   Final    ABUNDANT WBC PRESENT,BOTH PMN AND MONONUCLEAR NO ORGANISMS SEEN    Culture NO GROWTH 3 DAYS  Final   Report Status 10/06/2016 FINAL  Final         Radiology Studies: Dg Chest Port 1 View  Result Date: 10/06/2016 CLINICAL DATA:  Persistent cough and congestion. EXAM: PORTABLE CHEST 1 VIEW COMPARISON:  10/03/2016 . FINDINGS: Left PICC line noted with tip at cavoatrial junction. Cardiomegaly. Bilateral pulmonary infiltrates and pleural effusions noted progressed from prior exam of 10/03/2016. CHF and bilateral pneumonia could present this fashion. Low lung volumes. Drainage catheter noted over the right upper quadrant in stable position. IMPRESSION: 1. Left PICC line noted with tip at cavoatrial junction. Drainage catheter right upper quadrant stable position. 2. Cardiomegaly with bibasilar pulmonary infiltrates/  edema and bilateral pleural effusions. Findings have progressed from prior exam of 10/03/2016. Low lung volumes . Electronically Signed   By: Marcello Moores  Register   On: 10/06/2016 17:19        Scheduled Meds: . cefTAZidime (FORTAZ)  IV  2 g Intravenous Q8H  . Chlorhexidine Gluconate Cloth  6 each Topical Daily  . docusate sodium  100 mg Oral BID  . feeding supplement (ENSURE ENLIVE)  237 mL Oral BID BM  . insulin aspart   0-9 Units Subcutaneous Q4H  . mouth rinse  15 mL Mouth Rinse BID  . mirtazapine  30 mg Oral QHS  . potassium chloride  40 mEq Oral Q4H  . sodium chloride flush  10-40 mL Intracatheter Q12H  . vancomycin  750 mg Intravenous Q12H  . warfarin  2.5 mg Oral ONCE-1800  . Warfarin - Pharmacist Dosing Inpatient   Does not apply q1800   Continuous Infusions: . dextrose    . heparin 1,200 Units/hr (10/07/16 1655)     LOS: 9 days     Cordelia Poche, MD [facility] 10/08/2016, 2:45 PM Pager: [phone]  If 7PM-7AM, please contact night-coverage [email or web address] Password [email or web address] 10/08/2016, 2:45 PM

## 2016-10-08 NOTE — Progress Notes (Signed)
Daily Progress Note   Patient Name: Stephen Bates       Date: 10/08/2016 DOB: 21-Feb-1927  Age: 83 y.o. MRN#: [id number] Attending Physician: Mariel Aloe, MD Primary Care Physician: Asencion Noble, MD Admit Date: 09/29/2016  Reason for Consultation/Follow-up: Establishing goals of care and Hospice Evaluation d/t new stroke and declining functional status.   Subjective: Stephen Bates is less interactive today.   Length of Stay: 9  Current Medications: Scheduled Meds:  . cefTAZidime (FORTAZ)  IV  2 g Intravenous Q8H  . Chlorhexidine Gluconate Cloth  6 each Topical Daily  . docusate sodium  100 mg Oral BID  . feeding supplement (ENSURE ENLIVE)  237 mL Oral BID BM  . insulin aspart  0-9 Units Subcutaneous Q4H  . mouth rinse  15 mL Mouth Rinse BID  . mirtazapine  30 mg Oral QHS  . potassium chloride  40 mEq Oral Q4H  . sodium chloride flush  10-40 mL Intracatheter Q12H  . vancomycin  750 mg Intravenous Q12H  . warfarin  2.5 mg Oral ONCE-1800  . Warfarin - Pharmacist Dosing Inpatient   Does not apply q1800    Continuous Infusions: . dextrose    . heparin 1,200 Units/hr (10/07/16 1655)    PRN Meds: acetaminophen, polyethylene glycol, sodium chloride flush  Physical Exam  Constitutional:  Thin, frail  HENT:  Dressing on head CDI  Cardiovascular: Normal rate.  An irregular rhythm present.  Pulmonary/Chest: He has decreased breath sounds.  Rhonchi has improved. Unable to cough when asked.   Abdominal: He exhibits distension. There is no tenderness.  Multiple skin tears, perc chole draining to gravity drainage is bilious  Neurological: He is alert.  Able to speak more and appropriate responses at times. Repetitive and difficult to understand at times.   Nursing note and vitals  reviewed.           Vital Signs: BP (!) 148/60 (BP Location: Right Arm)   Pulse 80   Temp 97.5 F (36.4 C)   Resp (!) 24   Ht 5\' 5"  (1.651 m)   Wt 61.3 kg (135 lb 2.3 oz)   SpO2 96%   BMI 22.49 kg/m  SpO2: SpO2: 96 % O2 Device: O2 Device: Not Delivered O2 Flow Rate: O2 Flow Rate (L/min): 2 L/min  Intake/output summary:  Intake/Output Summary (Last 24 hours) at 10/08/16 1601 Last data filed at 10/08/16 1242  Gross per 24 hour  Intake            353.6 ml  Output             1680 ml  Net          -1326.4 ml   LBM: Last BM Date: 10/04/16 Baseline Weight: Weight: 61.2 kg (134 lb 14.7 oz) Most recent weight: Weight: 61.3 kg (135 lb 2.3 oz)       Palliative Assessment/Data:    Flowsheet Rows     Most Recent Value  Intake Tab  Referral Department  Hospitalist  Unit at Time of Referral  Med/Surg Unit  Palliative Care Primary Diagnosis  Neurology  Date Notified  10/04/16  Palliative Care Type  New Palliative care  Reason for referral  Clarify Goals of Care, Counsel Regarding Hospice  Date of Admission  09/29/16  Date first seen by Palliative Care  10/05/16  # of days Palliative referral response time  1 Day(s)  # of days IP prior to Palliative referral  5  Clinical Assessment  Palliative Performance Scale Score  30%  Pain Max last 24 hours  Not able to report  Pain Min Last 24 hours  Not able to report  Dyspnea Max Last 24 Hours  Not able to report  Dyspnea Min Last 24 hours  Not able to report  Nausea Max Last 24 Hours  Not able to report  Nausea Min Last 24 Hours  Not able to report  Anxiety Max Last 24 Hours  Not able to report  Anxiety Min Last 24 Hours  Not able to report  Other Max Last 24 Hours  Not able to report  Psychosocial & Spiritual Assessment  Palliative Care Outcomes  Patient/Family meeting held?  Yes  Who was at the meeting?  dtr [place] via phone. Trying to arrange mtg with both daughters and their mother who is also in the hospital  [place] regarding hospice, Provided psychosocial or spiritual support  Patient/Family wishes: Interventions discontinued/not started   Mechanical Ventilation, Vasopressors, Trach  Palliative Care follow-up planned  Yes, Facility      Patient Active Problem List   Diagnosis Date Noted  . Goals of care, counseling/discussion   . Acute CVA (cerebrovascular accident) ([place])   . Palliative care encounter   . Encephalopathy   . Pleural effusion   . Acute respiratory failure with hypoxia ([place]) 09/30/2016  . Severe sepsis with septic shock ([place]) 09/29/2016  . HCAP (healthcare-associated pneumonia) 09/29/2016  . Septic shock ([place]) 09/29/2016  . Pressure injury of skin 09/18/2016  . History of right bundle branch block   . Diabetes ([place])   . Cholecystitis 08/24/2015  . Acute cholecystitis 08/24/2015  . Malnutrition of moderate degree 05/24/2015  . Pressure ulcer 05/23/2015  . RUQ abdominal pain 05/22/2015  . 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 CAP (community acquired pneumonia) 01/05/2014  . Hyponatremia 01/03/2014  . Ileus ([place]) 01/03/2014  . Dehydration, mild 01/03/2014  . Mild dementia 11/29/2013  . Parkinsonism ([place]) 11/29/2013  . Gait difficulty 11/29/2013  . Neuropathy ([place]) 11/29/2013  . Memory loss 11/29/2013  . Stroke [facility]) history 07/07/1980    Palliative Care Assessment & Plan   HPI: 83 y.o. male  with past medical history of Diabetes, dementia , prostate cancer, CVA, admitted on 09/29/2016 after being found unresponsive at home. He remained unresponsive in the  emergency room and was intubated for airway protection. He required norepinephrine for shock. Chest x-ray concerning for multifocal pneumonia. He was transferred to the ICU then subsequently  stabilized and transferred to the hospitalist service. Imaging reflects the patient has had a new acute CVA. Patient was recently admitted to [facility] from 09/17/2016 to 09/24/2016 with acute  cholecystitis. He now has a cholecystostomy drain. Remains with poor appetite and intake.   Assessment: I spoke with his family at bedside (brother, sister-in-law, niece). They say he has been lethargic during their visit and not very interactive. He did greet me and when I asked how he was he responds "I'm okay." I could not get him to name his brother or family but he did say "I know who."   I relayed these findings to daughter, [name]. We further discussed concern for intermittent aspiration. Lungs sound better but WBC slightly increased. Will discuss results of CXR tomorrow. They inquired about a lumbar puncture and when this is done - I educated that there are no plans for this and there is no indication. They says this was done with his last stroke but I informed this is not included in a typical work up for stroke. Encouraged them to continue discussing feeding tube - I do not recommend. Also educated that feeding tube will likely only make things worse if he is aspirating. BUT if feeding tube desired I think he could still likely have hospice at home as well.   I will follow up and discuss more tomorrow.   Recommendations/Plan: Likely aspiration: Consider Robinul IV 0.2 every 4 hours prn if needed. Does not appear to need this at this time - is comfortable. Lungs sound better and CXR for tomorrow.  Poor intake: Appetite is not improved.   Goals of Care and Additional Recommendations:  Limitations on Scope of Treatment: Undetermined  Code Status:  DNR confirmed  Prognosis:   < 6 months very likely, likely less with continued poor appetite and no feeding tube. Possible poor even with feeding tube as likely high aspiration risk.   Discharge Planning:  To Be Determined   Thank you for allowing the Palliative Medicine Team to assist in the care of this patient.   Total Time 44min Prolonged Time Billed  no      Greater than 50%  of this time was spent counseling and coordinating  care related to the above assessment and plan.  Vinie Sill, NP Palliative Medicine Team Pager # [phone] (M-F 8a-5p) Team Phone # [phone] (Nights/Weekends)

## 2016-10-09 ENCOUNTER — Inpatient Hospital Stay (HOSPITAL_COMMUNITY): Payer: Medicare Other

## 2016-10-09 LAB — CBC
HEMATOCRIT: 33.1 % — AB (ref 39.0–52.0)
Hemoglobin: 10.5 g/dL — ABNORMAL LOW (ref 13.0–17.0)
MCH: 31.8 pg (ref 26.0–34.0)
MCHC: 31.7 g/dL (ref 30.0–36.0)
MCV: 100.3 fL — ABNORMAL HIGH (ref 78.0–100.0)
Platelets: 323 10*3/uL (ref 150–400)
RBC: 3.3 MIL/uL — ABNORMAL LOW (ref 4.22–5.81)
RDW: 13.6 % (ref 11.5–15.5)
WBC: 9.6 10*3/uL (ref 4.0–10.5)

## 2016-10-09 LAB — GLUCOSE, CAPILLARY
GLUCOSE-CAPILLARY: 104 mg/dL — AB (ref 65–99)
GLUCOSE-CAPILLARY: 126 mg/dL — AB (ref 65–99)
GLUCOSE-CAPILLARY: 86 mg/dL (ref 65–99)
Glucose-Capillary: 101 mg/dL — ABNORMAL HIGH (ref 65–99)
Glucose-Capillary: 159 mg/dL — ABNORMAL HIGH (ref 65–99)
Glucose-Capillary: 140 mg/dL — ABNORMAL HIGH (ref 65–99)

## 2016-10-09 LAB — PROTIME-INR
INR: 2
Prothrombin Time: 23 seconds — ABNORMAL HIGH (ref 11.4–15.2)

## 2016-10-09 LAB — HEPARIN LEVEL (UNFRACTIONATED): HEPARIN UNFRACTIONATED: 0.46 [IU]/mL (ref 0.30–0.70)

## 2016-10-09 MED ORDER — SENNA 8.6 MG PO TABS
2.0000 | ORAL_TABLET | Freq: Every day | ORAL | Status: DC
  Administered 2016-10-10 – 2016-10-14 (×4): 17.2 mg via ORAL
  Filled 2016-10-09 (×4): qty 2

## 2016-10-09 MED ORDER — DOCUSATE SODIUM 50 MG/5ML PO LIQD
100.0000 mg | Freq: Two times a day (BID) | ORAL | Status: DC
  Administered 2016-10-10 – 2016-10-14 (×8): 100 mg via ORAL
  Filled 2016-10-09 (×8): qty 10

## 2016-10-09 MED ORDER — FUROSEMIDE 10 MG/ML IJ SOLN
40.0000 mg | Freq: Once | INTRAMUSCULAR | Status: AC
  Administered 2016-10-09: 40 mg via INTRAVENOUS
  Filled 2016-10-09: qty 4

## 2016-10-09 MED ORDER — BISACODYL 10 MG RE SUPP: 10.0000 mg | Freq: Every day | RECTAL | Status: DC | PRN

## 2016-10-09 MED ORDER — WARFARIN SODIUM 2.5 MG PO TABS
2.5000 mg | ORAL_TABLET | Freq: Once | ORAL | Status: AC
  Administered 2016-10-09: 2.5 mg via ORAL
  Filled 2016-10-09: qty 1

## 2016-10-09 NOTE — Progress Notes (Signed)
Patient sleeping.  I cannot get him alert enough to feel comfortable giving his medication. Attempted X 2. Will monitor patient. Bed Alarm on Safety maintained.

## 2016-10-09 NOTE — Progress Notes (Signed)
Daily Progress Note   Patient Name: Stephen Bates       Date: 10/09/2016 DOB: 1926/08/10  Age: 83 y.o. MRN#: [id number] Attending Physician: Mariel Aloe, MD Primary Care Physician: Asencion Noble, MD Admit Date: 09/29/2016  Reason for Consultation/Follow-up: Establishing goals of care and Hospice Evaluation d/t new stroke and declining functional status.   Subjective: Stephen Bates is less interactive today again.   Length of Stay: 10  Current Medications: Scheduled Meds:  . Chlorhexidine Gluconate Cloth  6 each Topical Daily  . docusate sodium  100 mg Oral BID  . feeding supplement (ENSURE ENLIVE)  237 mL Oral BID BM  . insulin aspart  0-9 Units Subcutaneous Q4H  . mouth rinse  15 mL Mouth Rinse BID  . mirtazapine  30 mg Oral QHS  . [START ON 10/10/2016] senna  2 tablet Oral Daily  . sodium chloride flush  10-40 mL Intracatheter Q12H  . warfarin  2.5 mg Oral ONCE-1800  . Warfarin - Pharmacist Dosing Inpatient   Does not apply q1800    Continuous Infusions: . dextrose 20 mL/hr at 10/08/16 1715    PRN Meds: acetaminophen, bisacodyl, polyethylene glycol, sodium chloride flush  Physical Exam  Constitutional: He appears lethargic.  Thin, frail  HENT:  Dressing on head CDI  Cardiovascular: Normal rate.  An irregular rhythm present.  Pulmonary/Chest: Effort normal. No accessory muscle usage. No tachypnea. No respiratory distress. He has decreased breath sounds. He has rhonchi.  Rhonchi has improved. Unable to cough when asked.   Abdominal: He exhibits distension. There is no tenderness.  Multiple skin tears, perc chole draining to gravity drainage is bilious  Neurological: He appears lethargic.  Nursing note and vitals reviewed.           Vital Signs: BP (!) 150/69 (BP  Location: Right Arm)   Pulse 86   Temp 98.5 F (36.9 C) (Oral)   Resp 16   Ht 5\' 5"  (1.651 m)   Wt 60.3 kg (133 lb)   SpO2 94%   BMI 22.13 kg/m  SpO2: SpO2: 94 % O2 Device: O2 Device: Nasal Cannula O2 Flow Rate: O2 Flow Rate (L/min): 3 L/min  Intake/output summary:   Intake/Output Summary (Last 24 hours) at 10/09/16 1806 Last data filed at 10/09/16 1455  Gross per 24 hour  Intake  471 ml  Output             2150 ml  Net            -1679 ml   LBM: Last BM Date: 10/04/16 Baseline Weight: Weight: 61.2 kg (134 lb 14.7 oz) Most recent weight: Weight: 60.3 kg (133 lb)       Palliative Assessment/Data:    Flowsheet Rows     Most Recent Value  Intake Tab  Referral Department  Hospitalist  Unit at Time of Referral  Med/Surg Unit  Palliative Care Primary Diagnosis  Neurology  Date Notified  10/04/16  Palliative Care Type  New Palliative care  Reason for referral  Clarify Goals of Care, Counsel Regarding Hospice  Date of Admission  09/29/16  Date first seen by Palliative Care  10/05/16  # of days Palliative referral response time  1 Day(s)  # of days IP prior to Palliative referral  5  Clinical Assessment  Palliative Performance Scale Score  30%  Pain Max last 24 hours  Not able to report  Pain Min Last 24 hours  Not able to report  Dyspnea Max Last 24 Hours  Not able to report  Dyspnea Min Last 24 hours  Not able to report  Nausea Max Last 24 Hours  Not able to report  Nausea Min Last 24 Hours  Not able to report  Anxiety Max Last 24 Hours  Not able to report  Anxiety Min Last 24 Hours  Not able to report  Other Max Last 24 Hours  Not able to report  Psychosocial & Spiritual Assessment  Palliative Care Outcomes  Patient/Family meeting held?  Yes  Who was at the meeting?  dtr [place] via phone. Trying to arrange mtg with both daughters and their mother who is also in the hospital  [place] regarding hospice, Provided psychosocial  or spiritual support  Patient/Family wishes: Interventions discontinued/not started   Mechanical Ventilation, Vasopressors, Trach  Palliative Care follow-up planned  Yes, Facility      Patient Active Problem List   Diagnosis Date Noted  . Poor appetite   . Goals of care, counseling/discussion   . Acute CVA (cerebrovascular accident) ([place])   . Palliative care encounter   . Encephalopathy   . Pleural effusion   . Acute respiratory failure with hypoxia ([place]) 09/30/2016  . Severe sepsis with septic shock ([place]) 09/29/2016  . HCAP (healthcare-associated pneumonia) 09/29/2016  . Septic shock ([place]) 09/29/2016  . Pressure injury of skin 09/18/2016  . History of right bundle branch block   . Diabetes ([place])   . Cholecystitis 08/24/2015  . Acute cholecystitis 08/24/2015  . Malnutrition of moderate degree 05/24/2015  . Pressure ulcer 05/23/2015  . RUQ abdominal pain 05/22/2015  . 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 CAP (community acquired pneumonia) 01/05/2014  . Hyponatremia 01/03/2014  . Ileus ([place]) 01/03/2014  . Dehydration, mild 01/03/2014  . Mild dementia 11/29/2013  . Parkinsonism ([place]) 11/29/2013  . Gait difficulty 11/29/2013  . Neuropathy ([place]) 11/29/2013  . Memory loss 11/29/2013  . Stroke [facility]) history 07/07/1980    Palliative Care Assessment & Plan   HPI: 83 y.o. male  with past medical history of Diabetes, dementia , prostate cancer, CVA, admitted on 09/29/2016 after being found unresponsive at home. He remained unresponsive in the emergency room and was intubated for airway protection. He required norepinephrine for shock. Chest x-ray concerning for multifocal pneumonia. He was transferred to the ICU then subsequently  stabilized and transferred to the hospitalist service. Imaging reflects the patient has had a new acute CVA. Patient was recently admitted to [facility] from 09/17/2016 to 09/24/2016 with acute cholecystitis. He now has a cholecystostomy drain.  Remains with poor appetite and intake.   Assessment: Stephen Bates is sleepy today. He awakes briefly to tactile stimulation (not to voice). He did accept 2 bites of pudding and a few spoonfuls of Ensure - he told me "no" that he did not want but when I put spoon in front of him he opened his mouth and accepted. However I do not believe he will be even close to having adequate intake.   I relayed these findings to daughter, [name]. I further explained that they need to make a decision regarding feeding tube. I feel like they are likely to want feeding tube since he is does awaken and interact with them at times. I told them that I would recommend hospice even with feeding tube. They will discuss as a family and let us know what they decide tomorrow.   Main goal is to return home.    Recommendations/Plan: Likely aspiration: Consider Robinul IV 0.2 every 4 hours prn if needed. Does not appear to need this at this time - is comfortable. No resp distress.  Poor intake: Appetite is not improved.   Goals of Care and Additional Recommendations:  Limitations on Scope of Treatment: Undetermined  Code Status:  DNR confirmed  Prognosis:   < 6 months very likely, likely less with continued poor appetite and no feeding tube. Possible poor even with feeding tube as likely high aspiration risk.   Discharge Planning:  To Be Determined   Thank you for allowing the Palliative Medicine Team to assist in the care of this patient.   Total Time 59min Prolonged Time Billed  no      Greater than 50%  of this time was spent counseling and coordinating care related to the above assessment and plan.  Vinie Sill, NP Palliative Medicine Team Pager # [phone] (M-F 8a-5p) Team Phone # [phone] (Nights/Weekends)

## 2016-10-09 NOTE — Progress Notes (Signed)
Calorie Count Note  48 hour calorie count ordered.  Diet: Dysphagia 1 diet with thin liquids Supplements: Ensure Enlive po BID, each supplement provides 350 kcal and 20 grams of protein, Magic Cup TID with meals  Palliative care team following; [place] discussions with family ongoing. Pt family may desire a feeding tube.   Pt very lethargic at time of visit. Pt continues to refuse food and intake remains very minimal. Pt consuming 0-20% of meals. Observed cup of applesauce and Ensure- noted pt consumed about one bites of applesauce and Ensure, while opened, was not consumed.   Used meal completion records to estimate needs. Pt will likely be unable to meet nutritional needs via PO intake.   Day 1 Breakfast: 54 kcals, 2 grams protein Lunch: none documented Dinner: none documented Supplements: n/a  Total intake: 54 kcal (4% of minimum estimated needs)  2 protein (3% of minimum estimated needs)  Day 2 Breakfast: 0% Lunch: n/a Dinner: n/a Supplements: n/a  Nutrition Dx: Malnutrition (Moderate) related to  (dementia, bed bound, poor dention/difficulty chewing) as evidenced by energy intake < or equal to 75% for > or equal to 1 month, mild depletion of body fat, moderate depletions of muscle mass; ongoing  Goal: Patient will meet greater than or equal to 90% of their needs; unmet  Intervention:  -Continue Ensure Enlive po BID, each supplement provides 350 kcal and 20 grams of protein -Continue Magic Cup TID -If pt family desires feeding tube, recommend: Initiate Jevity 1.2 @ 25 ml/hr via NGT and increase by 10 ml every 12 hours to goal rate of 55 ml/hr.   If no IVF, recommend 110 ml free water flush 4 times daily (440 ml free water).   Tube feeding regimen provides 1584 kcal (100% of needs), 73 grams of protein, and 1065 ml of H2O.   Austynn Pridmore A. Jimmye Norman, RD, LDN, CDE Pager: [phone] After hours Pager: [phone]

## 2016-10-09 NOTE — Progress Notes (Signed)
PROGRESS NOTE    [name]  GUR:[id number] DOB: 1926/10/12 DOA: 09/29/2016 PCP: Asencion Noble, MD   Brief Narrative: Stephen Bates is a 83 y.o. male with hx of DM, dementia, prostate CA, and CVA who is bedbound. He presented after being found unresponsive requiring intubation and found to have another acute CVA. Goals of care discussions.   Assessment & Plan:   Principal Problem:   Severe sepsis with septic shock (HCC) Active Problems:   Mild dementia   Vascular parkinsonism (HCC)   Malnutrition of moderate degree   History of right bundle branch block   Diabetes ([place])   Stroke ([place]) history   HCAP (healthcare-associated pneumonia)   Septic shock (HCC)   Acute respiratory failure with hypoxia (HCC)   Encephalopathy   Pleural effusion   Acute CVA (cerebrovascular accident) [facility])   Palliative care encounter   Goals of care, counseling/discussion   Poor appetite   Septic shock with acute hypoxic respiratory failure secondary to HCAP Weaned off of oxygen. -discontinue Vancomycin and Fortaz -repeat CXR in AM  History of percutaneous drain s/p acute cholecystitis Culture significant for coagulase negative staph. Has had 10 days of vancomycin. -outpatient general surgery follow-up -discontinue vancomycin  Paroxysmal atrial fibrillation New. INR therapeutic. -Coumadin -discontinue heparin  Acute CVA Expressive aphasia. Stroke team signed off. -coumadin as above  Dysphagia Secondary to stroke. -dysphagia 1 diet  Chronic anemia Hemoglobin stable.  Diabetes mellitus -continue SSI  Acute encephalopathy Dementia Acute CVA likely contributing. No oral intake. -continue Remeron  Pleural effusions - lasix 20mg  x1  Goals of care Palliative care in discussions with family. Considering feeding tube vs comfort care  DVT prophylaxis: Coumadin Code Status: DNR/DNI Family Communication: None at bedside Disposition Plan: Discharge pending continued  discussions with family for goals of care   Consultants:   Palliative care medicine  Neurology  PCCM  Procedures:   None  Antimicrobials:  Vancomycin  Ceftazidime   Subjective: Not able to communicate secondary to somnolence.  Objective: Vitals:   10/08/16 1521 10/08/16 2242 10/09/16 0437 10/09/16 0443  BP: (!) 148/60 (!) 155/75 (!) 150/64   Pulse: 80 88 78   Resp: (!) 24 18 18    Temp: 97.5 F (36.4 C) 97.7 F (36.5 C) 97.9 F (36.6 C)   TempSrc:  Oral Oral   SpO2: 96% 97% 96%   Weight:    60.3 kg (133 lb)  Height:        Intake/Output Summary (Last 24 hours) at 10/09/16 1225 Last data filed at 10/09/16 1031  Gross per 24 hour  Intake              471 ml  Output             1250 ml  Net             -779 ml   Filed Weights   10/07/16 0120 10/08/16 0700 10/09/16 0443  Weight: 66.7 kg (147 lb) 61.3 kg (135 lb 2.3 oz) 60.3 kg (133 lb)    Examination:  General exam: Appears calm and comfortable Respiratory system: Diffuse rhonchi and slightly diminished bilaterally. Respiratory effort normal. Cardiovascular system: S1 & S2 heard, RRR. No murmurs Gastrointestinal system: Abdomen is nondistended, soft and nontender. Normal bowel sounds heard. Central nervous system: somnolent. Extremities: No edema. No calf tenderness Skin: No cyanosis. No rashes    Data Reviewed: I have personally reviewed following labs and imaging studies  CBC:  Recent Labs Lab 10/05/16 0458 10/06/16 0815  10/07/16 0421 10/08/16 0603 10/09/16 0500  WBC 9.7 9.2 8.9 10.7* 9.6  HGB 9.9* 9.8* 10.0* 10.6* 10.5*  HCT 30.9* 30.5* 31.5* 33.4* 33.1*  MCV 100.7* 99.7 100.3* 100.6* 100.3*  PLT 383 345 313 343 761   Basic Metabolic Panel:  Recent Labs Lab 10/03/16 0338 10/04/16 0514 10/05/16 0458 10/05/16 1000 10/06/16 0815 10/07/16 0421 10/07/16 0742 10/08/16 0603  NA 136 134* 132*  --  135 136  --  138  K 3.0* 3.6 2.9*  --  3.3* 3.0*  --  2.9*  CL 95* 96* 94*  --  98*  98*  --  100*  CO2 35* 31 33*  --  32 31  --  30  GLUCOSE 108* 130* 146*  --  122* 131*  --  176*  BUN 5* <5* <5*  --  <5* <5*  --  <5*  CREATININE 0.36* 0.37* 0.35*  --  0.40* 0.44*  --  0.51*  CALCIUM 7.9* 8.3* 8.4*  --  8.1* 8.6*  --  8.8*  MG 1.7  --   --  1.8  --   --  2.1  --   PHOS 2.6  --   --   --   --   --   --   --    GFR: Estimated Creatinine Clearance: 55.5 mL/min (A) (by C-G formula based on SCr of 0.51 mg/dL (L)). Liver Function Tests:  Recent Labs Lab 10/04/16 0514  AST 20  ALT 15*  ALKPHOS 67  BILITOT 0.5  PROT 5.6*  ALBUMIN 1.7*   No results for input(s): LIPASE, AMYLASE in the last 168 hours. No results for input(s): AMMONIA in the last 168 hours. Coagulation Profile:  Recent Labs Lab 10/05/16 0458 10/06/16 0513 10/07/16 0421 10/08/16 0603 10/09/16 0500  INR 1.19 1.34 1.59 1.79 2.00   Cardiac Enzymes: No results for input(s): CKTOTAL, CKMB, CKMBINDEX, TROPONINI in the last 168 hours. BNP (last 3 results) No results for input(s): PROBNP in the last 8760 hours. HbA1C: No results for input(s): HGBA1C in the last 72 hours. CBG:  Recent Labs Lab 10/08/16 2116 10/09/16 0014 10/09/16 0433 10/09/16 0804 10/09/16 1159  GLUCAP 135* 126* 104* 86 101*   Lipid Profile: No results for input(s): CHOL, HDL, LDLCALC, TRIG, CHOLHDL, LDLDIRECT in the last 72 hours. Thyroid Function Tests: No results for input(s): TSH, T4TOTAL, FREET4, T3FREE, THYROIDAB in the last 72 hours. Anemia Panel: No results for input(s): VITAMINB12, FOLATE, FERRITIN, TIBC, IRON, RETICCTPCT in the last 72 hours. Sepsis Labs: No results for input(s): PROCALCITON, LATICACIDVEN in the last 168 hours.  Recent Results (from the past 240 hour(s))  Blood culture (routine x 2)     Status: None   Collection Time: 09/29/16  3:20 PM  Result Value Ref Range Status   Specimen Description BLOOD RIGHT ARM  Final   Special Requests BOTTLES DRAWN AEROBIC AND ANAEROBIC 6CC  Final   Culture NO  GROWTH 5 DAYS  Final   Report Status 10/04/2016 FINAL  Final  Blood culture (routine x 2)     Status: None   Collection Time: 09/29/16  3:20 PM  Result Value Ref Range Status   Specimen Description BLOOD RIGHT WRIST  Final   Special Requests BOTTLES DRAWN AEROBIC AND ANAEROBIC 6CC  Final   Culture NO GROWTH 5 DAYS  Final   Report Status 10/04/2016 FINAL  Final  Culture, respiratory (NON-Expectorated)     Status: None   Collection Time: 09/29/16  5:28 PM  Result Value Ref Range Status   Specimen Description TRACHEAL ASPIRATE  Final   Special Requests Normal  Final   Gram Stain   Final    MODERATE WBC PRESENT,BOTH PMN AND MONONUCLEAR NO ORGANISMS SEEN    Culture   Final    Consistent with normal respiratory flora. Performed at [facility], [place] [address], [place],  [address]    Report Status 10/02/2016 FINAL  Final  MRSA PCR Screening     Status: None   Collection Time: 09/30/16  1:11 AM  Result Value Ref Range Status   MRSA by PCR NEGATIVE NEGATIVE Final    Comment:        The GeneXpert MRSA Assay (FDA approved for NASAL specimens only), is one component of a comprehensive MRSA colonization surveillance program. It is not intended to diagnose MRSA infection nor to guide or monitor treatment for MRSA infections.   Body fluid culture     Status: None   Collection Time: 09/30/16  2:40 AM  Result Value Ref Range Status   Specimen Description GALL BLADDER  Final   Special Requests NONE  Final   Gram Stain NO WBC SEEN RARE GRAM POSITIVE COCCI IN PAIRS   Final   Culture RARE STAPHYLOCOCCUS SPECIES (COAGULASE NEGATIVE)  Final   Report Status 10/03/2016 FINAL  Final   Organism ID, Bacteria STAPHYLOCOCCUS SPECIES (COAGULASE NEGATIVE)  Final      Susceptibility   Staphylococcus species (coagulase negative) - MIC*    CIPROFLOXACIN >=8 RESISTANT Resistant     ERYTHROMYCIN >=8 RESISTANT Resistant     GENTAMICIN <=0.5 SENSITIVE Sensitive     OXACILLIN >=4 RESISTANT  Resistant     TETRACYCLINE 2 SENSITIVE Sensitive     VANCOMYCIN 1 SENSITIVE Sensitive     TRIMETH/SULFA 80 RESISTANT Resistant     CLINDAMYCIN >=8 RESISTANT Resistant     RIFAMPIN <=0.5 SENSITIVE Sensitive     Inducible Clindamycin NEGATIVE Sensitive     * RARE STAPHYLOCOCCUS SPECIES (COAGULASE NEGATIVE)  Body fluid culture     Status: None   Collection Time: 10/02/16  2:23 PM  Result Value Ref Range Status   Specimen Description FLUID RIGHT PLEURAL  Final   Special Requests NONE  Final   Gram Stain   Final    ABUNDANT WBC PRESENT,BOTH PMN AND MONONUCLEAR NO ORGANISMS SEEN    Culture NO GROWTH 3 DAYS  Final   Report Status 10/06/2016 FINAL  Final         Radiology Studies: Dg Chest Port 1 View  Result Date: 10/09/2016 CLINICAL DATA:  Healthcare associated pneumonia, history of diabetes, previous CVA, former smoker. EXAM: PORTABLE CHEST 1 VIEW COMPARISON:  Portable chest x-ray of October 06, 2016 FINDINGS: The lungs remain mildly hypoinflated but on the left the aeration has improved slightly. There are bibasilar densities consistent with atelectasis or pneumonia. There are bilateral pleural effusions. The cardiac silhouette is mildly enlarged. The central pulmonary vascularity is prominent. The left-sided PICC line tip projects over the distal third of the SVC. IMPRESSION: Bibasilar atelectasis or pneumonia with pleural effusions, relatively stable. Stable enlargement of the cardiac silhouette with central pulmonary vascular prominence. Thoracic aortic atherosclerosis. Electronically Signed   By: David  Martinique M.D.   On: 10/09/2016 07:06        Scheduled Meds: . cefTAZidime (FORTAZ)  IV  2 g Intravenous Q8H  . Chlorhexidine Gluconate Cloth  6 each Topical Daily  . docusate sodium  100 mg Oral BID  .  feeding supplement (ENSURE ENLIVE)  237 mL Oral BID BM  . insulin aspart  0-9 Units Subcutaneous Q4H  . mouth rinse  15 mL Mouth Rinse BID  . mirtazapine  30 mg Oral QHS  . senna  1  tablet Oral Daily  . sodium chloride flush  10-40 mL Intracatheter Q12H  . vancomycin  750 mg Intravenous Q12H  . Warfarin - Pharmacist Dosing Inpatient   Does not apply q1800   Continuous Infusions: . dextrose 20 mL/hr at 10/08/16 1715  . heparin 1,200 Units/hr (10/08/16 1715)     LOS: 10 days     Cordelia Poche, MD [facility] 10/09/2016, 12:25 PM Pager: [phone]  If 7PM-7AM, please contact night-coverage [email or web address] Password [facility] 10/09/2016, 12:25 PM

## 2016-10-09 NOTE — Progress Notes (Signed)
[facility] for Warfarin  Indication: atrial fibrillation and acute CVA  No Known Allergies  Patient Measurements: Height: 5\' 5"  (165.1 cm) Weight: 133 lb (60.3 kg) IBW/kg (Calculated) : 61.5   Vital Signs: Temp: 97.9 F (36.6 C) (04/05 0437) Temp Source: Oral (04/05 0437) BP: 150/64 (04/05 0437) Pulse Rate: 78 (04/05 0437)  Labs:  Recent Labs  10/07/16 0421 10/08/16 0603 10/08/16 1228 10/09/16 0500  HGB 10.0* 10.6*  --  10.5*  HCT 31.5* 33.4*  --  33.1*  PLT 313 343  --  323  LABPROT 19.1* 21.0*  --  23.0*  INR 1.59 1.79  --  2.00  HEPARINUNFRC 0.50 <0.10* 0.39 0.46  CREATININE 0.44* 0.51*  --   --     Estimated Creatinine Clearance: 55.5 mL/min (A) (by C-G formula based on SCr of 0.51 mg/dL (L)).   Medical History: Past Medical History:  Diagnosis Date  . Arthritis   . Benign neoplasm of colon   . Chronic back pain   . Diabetes ([place])   . Frequent falls 2012  . History of right bundle branch block   . Hypertension   . Lumbar radiculopathy   . Mild dementia   . Neuropathy ([place])   . Prostate cancer ([place])   . Stroke ([place]) 1982  . Weakness of both legs   . Wears glasses     Assessment: 83 yo male admitted and found to have atrial fibrillation and acute CVA. INR is now therapeutic and heparin is discontinued.  INR has trended up appropriately on Coumadin 2.5mg  dose.  Will continue.    Goal of Therapy:  INR 2-3 Monitor platelets by anticoagulation protocol: Yes   Plan:  1. Coumadin 2.5 mg x 1 this evening  2. Monitor daily INR, CBC, clinical course, s/sx of bleed, PO intake, DDI   Thank you for allowing [place] to participate in this patients care.  [employer], Pharm.D., BCPS Clinical Pharmacist Pager [phone] 10/09/2016 1:44 PM

## 2016-10-10 LAB — GLUCOSE, CAPILLARY
GLUCOSE-CAPILLARY: 158 mg/dL — AB (ref 65–99)
Glucose-Capillary: 114 mg/dL — ABNORMAL HIGH (ref 65–99)
Glucose-Capillary: 140 mg/dL — ABNORMAL HIGH (ref 65–99)
Glucose-Capillary: 160 mg/dL — ABNORMAL HIGH (ref 65–99)
Glucose-Capillary: 145 mg/dL — ABNORMAL HIGH (ref 65–99)
Glucose-Capillary: 161 mg/dL — ABNORMAL HIGH (ref 65–99)

## 2016-10-10 LAB — CBC
HCT: 34.9 % — ABNORMAL LOW (ref 39.0–52.0)
Hemoglobin: 11.1 g/dL — ABNORMAL LOW (ref 13.0–17.0)
MCH: 31.5 pg (ref 26.0–34.0)
MCHC: 31.8 g/dL (ref 30.0–36.0)
MCV: 99.1 fL (ref 78.0–100.0)
PLATELETS: 308 10*3/uL (ref 150–400)
RBC: 3.52 MIL/uL — ABNORMAL LOW (ref 4.22–5.81)
RDW: 13.9 % (ref 11.5–15.5)
WBC: 9.3 10*3/uL (ref 4.0–10.5)

## 2016-10-10 LAB — PROTIME-INR
INR: 1.88
Prothrombin Time: 21.9 seconds — ABNORMAL HIGH (ref 11.4–15.2)

## 2016-10-10 MED ORDER — WARFARIN SODIUM 5 MG PO TABS
5.0000 mg | ORAL_TABLET | Freq: Once | ORAL | Status: AC
  Administered 2016-10-10: 5 mg via ORAL

## 2016-10-10 NOTE — Progress Notes (Signed)
Per MD and Palliative, family hoping to take patient home when ready for discharge.  CSW signing off. Please re-consult if needed.  Percell Locus Shawnee Gambone LCSWA [phone]

## 2016-10-10 NOTE — Progress Notes (Signed)
Calorie Count Note  48 hour calorie count ordered.  Diet: Dysphagia 1 diet with thin liquids Supplements: Ensure Enlive po BID, each supplement provides 350 kcal and 20 grams of protein, Magic Cup TID with meals  Palliative care team following; [place] discussions with family ongoing. Pt family may desire a feeding tube.   Pt remains very lethargic. Intake continues to be minimal and pt often refuses food.   Used meal completion records to estimate needs. Given meal completion and assessment, pt will be unablt to meet needs by PO route.   Day 1 Breakfast: 54 kcals, 2 grams protein Lunch: none documented Dinner: none documented Supplements: n/a  Total intake: 54 kcal (4% of minimum estimated needs)  2 protein (3% of minimum estimated needs)  Day 2 Breakfast: 0% Lunch: 136 kcals, 6 grams protein Dinner: n/a Supplements: n/a  Total intake: 136 kcal (10% of minimum estimated needs)  6 protein (9% of minimum estimated needs)  Average Total intake: 145 kcal (10% of minimum estimated needs)  4 protein (6% of minimum estimated needs)  Nutrition Dx: Malnutrition (Moderate)related to (dementia, bed bound, poor dention/difficulty chewing)as evidenced by energy intake < or equal to 75% for > or equal to 1 month, mild depletion of body fat, moderate depletions of muscle mass; ongoing  Goal: Patient will meet greater than or equal to 90% of their needs; unmet  Intervention:  -Continue Ensure Enlive po BID, each supplement provides 350 kcal and 20 grams of protein -Continue Magic Cup TID -If pt family desires feeding tube, recommend: Initiate Jevity 1.2 @ 25 ml/hr via NGT and increase by 10 ml every 12 hours to goal rate of 55 ml/hr.   If no IVF, recommend 110 ml free water flush 4 times daily (440 ml free water).   Tube feeding regimen provides 1584 kcal (100% of needs), 73 grams of protein, and 1065 ml of H2O.   Navina Wohlers A. Jimmye Norman, RD, LDN, CDE Pager:  [phone] After hours Pager: [phone]

## 2016-10-10 NOTE — Progress Notes (Signed)
Daily Progress Note   Patient Name: Stephen Bates       Date: 10/10/2016 DOB: 27-Feb-1927  Age: 83 y.o. MRN#: [id number] Attending Physician: Mariel Aloe, MD Primary Care Physician: Asencion Noble, MD Admit Date: 09/29/2016  Reason for Consultation/Follow-up: Establishing goals of care and Psychosocial/spiritual support  Subjective: Patient seen for follow-up regarding whether to move forward with a feeding tube. Wife and daughter, Stephen Bates, are at the bedside. Patient himself has aphasia, dysphagia and recurrent aspiration. He is taking bites and sips. Family has opted for a feeding tube then would like to go home with hospice support in the home  Length of Stay: 11  Current Medications: Scheduled Meds:  . Chlorhexidine Gluconate Cloth  6 each Topical Daily  . docusate  100 mg Oral BID  . feeding supplement (ENSURE ENLIVE)  237 mL Oral BID BM  . insulin aspart  0-9 Units Subcutaneous Q4H  . mouth rinse  15 mL Mouth Rinse BID  . mirtazapine  30 mg Oral QHS  . senna  2 tablet Oral Daily  . sodium chloride flush  10-40 mL Intracatheter Q12H  . warfarin  5 mg Oral ONCE-1800  . Warfarin - Pharmacist Dosing Inpatient   Does not apply q1800    Continuous Infusions: . dextrose 20 mL/hr at 10/08/16 1715    PRN Meds: acetaminophen, bisacodyl, polyethylene glycol, sodium chloride flush  Physical Exam          Vital Signs: BP (!) 156/73 (BP Location: Right Arm)   Pulse 86   Temp 97.9 F (36.6 C)   Resp 16   Ht 5\' 5"  (1.651 m)   Wt 60.1 kg (132 lb 7.9 oz)   SpO2 96%   BMI 22.05 kg/m  SpO2: SpO2: 96 % O2 Device: O2 Device: Not Delivered O2 Flow Rate: O2 Flow Rate (L/min): 3 L/min  Intake/output summary:  Intake/Output Summary (Last 24 hours) at 10/10/16 1712 Last data filed at  10/10/16 1132  Gross per 24 hour  Intake                0 ml  Output              725 ml  Net             -725 ml   LBM: Last BM  Date: 10/04/16 Baseline Weight: Weight: 61.2 kg (134 lb 14.7 oz) Most recent weight: Weight: 60.1 kg (132 lb 7.9 oz)       Palliative Assessment/Data:    Flowsheet Rows     Most Recent Value  Intake Tab  Referral Department  Hospitalist  Unit at Time of Referral  Med/Surg Unit  Palliative Care Primary Diagnosis  Neurology  Date Notified  10/04/16  Palliative Care Type  New Palliative care  Reason for referral  Clarify Goals of Care, Counsel Regarding Hospice  Date of Admission  09/29/16  Date first seen by Palliative Care  10/05/16  # of days Palliative referral response time  1 Day(s)  # of days IP prior to Palliative referral  5  Clinical Assessment  Palliative Performance Scale Score  30%  Pain Max last 24 hours  Not able to report  Pain Min Last 24 hours  Not able to report  Dyspnea Max Last 24 Hours  Not able to report  Dyspnea Min Last 24 hours  Not able to report  Nausea Max Last 24 Hours  Not able to report  Nausea Min Last 24 Hours  Not able to report  Anxiety Max Last 24 Hours  Not able to report  Anxiety Min Last 24 Hours  Not able to report  Other Max Last 24 Hours  Not able to report  Psychosocial & Spiritual Assessment  Palliative Care Outcomes  Patient/Family meeting held?  Yes  Who was at the meeting?  dtr Griffin via phone. Trying to arrange mtg with both daughters and their mother who is also in the hospital  [place] regarding hospice, Provided psychosocial or spiritual support  Patient/Family wishes: Interventions discontinued/not started   Mechanical Ventilation, Vasopressors, Trach  Palliative Care follow-up planned  Yes, Facility      Patient Active Problem List   Diagnosis Date Noted  . Decreased appetite   . Goals of care, counseling/discussion   . Acute CVA (cerebrovascular accident)  ([place])   . Palliative care encounter   . Encephalopathy   . Pleural effusion   . Acute respiratory failure with hypoxia ([place]) 09/30/2016  . Severe sepsis with septic shock ([place]) 09/29/2016  . HCAP (healthcare-associated pneumonia) 09/29/2016  . Septic shock ([place]) 09/29/2016  . Pressure injury of skin 09/18/2016  . History of right bundle branch block   . Diabetes ([place])   . Cholecystitis 08/24/2015  . Acute cholecystitis 08/24/2015  . Malnutrition of moderate degree 05/24/2015  . Pressure ulcer 05/23/2015  . RUQ abdominal pain 05/22/2015  . Vascular parkinsonism ([place]) 04/21/2014  . CAP (community acquired pneumonia) 01/05/2014  . Hyponatremia 01/03/2014  . Ileus ([place]) 01/03/2014  . Dehydration, mild 01/03/2014  . Mild dementia 11/29/2013  . Parkinsonism ([place]) 11/29/2013  . Gait difficulty 11/29/2013  . Neuropathy ([place]) 11/29/2013  . Memory loss 11/29/2013  . Stroke [facility]) history 07/07/1980    Palliative Care Assessment & Plan   Patient Profile: 83 y.o.malewith past medical history of Diabetes, dementia , prostate cancer, CVA,admitted on 3/26/2018after being found unresponsive at home. He remained unresponsive in the emergency room and was intubated for airway protection. He required norepinephrine for shock. Chest x-ray concerning for multifocal pneumonia. He was transferred to the [facility] subsequently stabilized andtransferred to the hospitalist service. Imaging reflects the patient has had a new acute CVA. Patient was recently admitted to [facility] from 09/17/2016 to 09/24/2016 with acute cholecystitis. He now has a cholecystostomy drain. Remains with poor appetite  and intake.    Recommendations/Plan:  Family would like for a PEG tube to be placed. They understand that feeding tubes do not eliminate the risk of aspiration and that his risk remains very high for recurrent aspiration pneumonia. Wife is struggling with wanting to feel as though she is done  "everything" that she can for him even at end-of-life  After placement, would like to go home with hospice support in the home  Goals of Care and Additional Recommendations:  Limitations on Scope of Treatment: Avoid Hospitalization, Initiate Comfort Feeding, No Chemotherapy, No Hemodialysis, No Radiation and No Tracheostomy  Code Status:    Code Status Orders        Start     Ordered   10/02/16 1514  Do not attempt resuscitation (DNR)  Continuous    Question Answer Comment  In the event of cardiac or respiratory ARREST Do not call a "code blue"   In the event of cardiac or respiratory ARREST Do not perform Intubation, CPR, defibrillation or ACLS   In the event of cardiac or respiratory ARREST Use medication by any route, position, wound care, and other measures to relive pain and suffering. May use oxygen, suction and manual treatment of airway obstruction as needed for comfort.      10/02/16 1514    Code Status History    Date Active Date Inactive Code Status Order ID Comments User Context   09/30/2016  4:06 PM 10/02/2016  3:14 PM Partial Code [id number]  Rigoberto Noel, MD Inpatient   09/30/2016  1:06 AM 09/30/2016  4:06 PM Full Code [id number]  Corey Harold, NP Inpatient   09/18/2016  1:01 AM 09/24/2016  1:48 PM Full Code [id number]  Phillips Grout, MD ED   08/24/2015  4:49 AM 08/26/2015  4:43 PM Full Code [id number]  Phillips Grout, MD ED   05/22/2015  1:18 PM 05/25/2015  7:22 PM DNR [id number]  Asencion Noble, MD Inpatient   03/28/2014 12:27 PM 03/29/2014  3:33 AM Full Code [id number]  Luanne Bras, MD HOV   02/03/2014 10:49 AM 02/04/2014  3:33 AM Full Code [id number]  Luanne Bras, MD HOV   01/03/2014 11:25 PM 01/07/2014  2:50 PM Full Code [id number]  Phillips Grout, MD Inpatient       Prognosis:   < 6 months in the setting of advanced dementia, bedbound status, CVA; aspiration  Discharge Planning:  Home with Hospice  Care plan was discussed with Dr. Teryl Lucy  Thank you for allowing  the Palliative Medicine Team to assist in the care of this patient.   Time In: 1600 Time Out: 1635 Total Time 35 min Prolonged Time Billed  no       Greater than 50%  of this time was spent counseling and coordinating care related to the above assessment and plan.  Dory Horn, NP  Please contact Palliative Medicine Team phone at [phone] for questions and concerns.

## 2016-10-10 NOTE — Consult Note (Signed)
   [facility]  CM Inpatient Consult   10/10/2016  Stephen Bates 09/27/1926 [id number]   Chart reviewed for re-admission as a beneficiary of Medicare ACO.  Patient's chart review reveals the patient is HPI: 83 year old male with PMH significant for DM, bedbound, dementia, prostate Ca, and CVA. He was also recently admitted 3/14 >3/21 to [facility] for cholecystitis treated with IV abx and cystostomy placement. Hospital course complicated by PAF, which resolved spontaneously. He was discharged to home with home health nursing. 3/26 he presented to ED after being found unresponsive at home. He remained unresponsive in ED and was intubated for airway protection. He was transferred to [facility] care.  ETT 3/26-3/29.  Dx acute hypoxic resp failure in setting of HCAP, septic shock - resolved.  Changes in behavior with weakness, gaze preference; CT 3/30 Acute versus subacute nonhemorrhagic left parietal lobe (left MCA.  Primary Care Provider is Dr. Asencion Noble. Patient has been seen by Palliative Care and notes reveals the patient's family is planning for home with palliative care and recommending hospice care. Final disposition is not noted at this time.  Patient was resting in the bed. For questions please contact:  Natividad Brood, RN BSN [facility] Liaison  [phone] business mobile phone Toll free office [phone]

## 2016-10-10 NOTE — Progress Notes (Signed)
[place] for Warfarin  Indication: atrial fibrillation and acute CVA  No Known Allergies  Patient Measurements: Height: 5\' 5"  (165.1 cm) Weight: 132 lb 7.9 oz (60.1 kg) IBW/kg (Calculated) : 61.5   Vital Signs: Temp: 97.2 F (36.2 C) (04/06 0447) Temp Source: Oral (04/06 0447) BP: 139/69 (04/06 0447) Pulse Rate: 85 (04/06 0447)  Labs:  Recent Labs  10/08/16 0603 10/08/16 1228 10/09/16 0500 10/10/16 0531  HGB 10.6*  --  10.5* 11.1*  HCT 33.4*  --  33.1* 34.9*  PLT 343  --  323 308  LABPROT 21.0*  --  23.0* 21.9*  INR 1.79  --  2.00 1.88  HEPARINUNFRC <0.10* 0.39 0.46  --   CREATININE 0.51*  --   --   --     Estimated Creatinine Clearance: 55.3 mL/min (A) (by C-G formula based on SCr of 0.51 mg/dL (L)).   Assessment: 83 yo male admitted and found to have atrial fibrillation and acute CVA.Heparin was discontinued with an INR of 2. This morning, INR dropped slightly to 1.88.    Noted patient with poor intake which could be affecting INR trend.  Hgb and platelets are stable, no bleeding noted.  Goal of Therapy:  INR 2-3 Monitor platelets by anticoagulation protocol: Yes   Plan:  -Warfarin 5mg  po x1 tonight  -Discussed with Dr. Lonny Prude- no periphera anticoagulation for now and will continue to watch INR trend -Daily INR and CBC   Sherrise Liberto D. Alverna Fawley, PharmD, BCPS Clinical Pharmacist Pager: [phone] 10/10/2016 8:59 AM

## 2016-10-10 NOTE — Progress Notes (Signed)
  Speech Language Pathology Treatment: Dysphagia  Patient Details Name: Stephen Bates MRN: [id number] DOB: 1926-11-23 Today's Date: 10/10/2016 Time: 9357-0177 SLP Time Calculation (min) (ACUTE ONLY): 13 min  Assessment / Plan / Recommendation Clinical Impression  SLP followed up for goals of care. Pt continues to exhibit reduced motivation for PO. Pt with verbalizations of "no" when offered thin liquids and puree snack though opened mouth for acceptance x3. Further offered trials resulted in shaking head and decline. Pt with delayed congested cough following PO completion. Note palliative encounters with plans for hospice. Continue dysphagia 1 (puree) and thin liquid diet however anticipate poor willingness and lack of motivation to eat. No further ST needs identified, will sign off.      HPI HPI: 83 year old male with PMH significant for DM, bedbound, dementia, prostate Ca, and CVA. He was also recently admitted 3/14 >3/21 to [facility] for cholecystitis treated with IV abx and cystostomy placement. Hospital course complicated by PAF, which resolved spontaneously. He was discharged to home with home health nursing. 3/26 he presented to ED after being found unresponsive at home. He remained unresponsive in ED and was intubated for airway protection. He was transferred to [facility] for ICU care.  ETT 3/26-3/29.  Dx acute hypoxic resp failure in setting of HCAP, septic shock - resolved.  Changes in behavior with weakness, gaze preference; CT 3/30 Acute versus subacute nonhemorrhagic left parietal lobe (left MCA      SLP Plan          Recommendations  Diet recommendations: Dysphagia 1 (puree);Thin liquid Liquids provided via: Cup;Straw Medication Administration: Crushed with puree Supervision: Staff to assist with self feeding;Full supervision/cueing for compensatory strategies Compensations: Minimize environmental distractions;Slow rate;Small sips/bites Postural Changes and/or Swallow  Maneuvers: Seated upright 90 degrees                Oral Care Recommendations: Oral care BID Follow up Recommendations: None SLP Visit Diagnosis: Dysphagia, unspecified (R13.10)       GO               Arvil Chaco MA, CCC-SLP Acute Care Speech Language Pathologist    Levi Aland 10/10/2016, 10:55 AM

## 2016-10-10 NOTE — Progress Notes (Signed)
PROGRESS NOTE    Stephen Bates  YQI:[id number] DOB: 02-11-1927 DOA: 09/29/2016 PCP: Asencion Noble, MD   Brief Narrative: Stephen Bates is a 83 y.o. male with hx of DM, dementia, prostate CA, and CVA who is bedbound. He presented after being found unresponsive requiring intubation and found to have another acute CVA. Goals of care discussions.   Assessment & Plan:   Principal Problem:   Severe sepsis with septic shock (HCC) Active Problems:   Mild dementia   Vascular parkinsonism (HCC)   Malnutrition of moderate degree   History of right bundle branch block   Diabetes ([place])   Stroke ([place]) history   HCAP (healthcare-associated pneumonia)   Septic shock (HCC)   Acute respiratory failure with hypoxia (HCC)   Encephalopathy   Pleural effusion   Acute CVA (cerebrovascular accident) ([place])   Palliative care encounter   Goals of care, counseling/discussion   Decreased appetite   Septic shock with acute hypoxic respiratory failure secondary to HCAP Weaned off of oxygen.  History of percutaneous drain s/p acute cholecystitis Culture significant for coagulase negative staph. Has had 10 days of vancomycin. -outpatient general surgery follow-up  Paroxysmal atrial fibrillation New. INR therapeutic. -Coumadin  Acute CVA Expressive aphasia. Stroke team signed off. -coumadin as above  Dysphagia Secondary to stroke. -dysphagia 1 diet  Chronic anemia Hemoglobin stable.  Diabetes mellitus -continue SSI  Acute encephalopathy Dementia Acute CVA likely contributing. No oral intake. -continue Remeron  Pleural effusions Given lasix with good output  Goals of care Palliative care in discussions with family. Considering feeding tube vs comfort care  DVT prophylaxis: Coumadin Code Status: DNR/DNI Family Communication: Attempted to call wife with no response Disposition Plan: Discharge pending continued discussions with family for goals of care   Consultants:    Palliative care medicine  Neurology  PCCM  Procedures:   None  Antimicrobials:  Vancomycin  Ceftazidime   Subjective: No pain. No dyspnea.  Objective: Vitals:   10/09/16 2259 10/10/16 0239 10/10/16 0447 10/10/16 1507  BP: (!) 144/77  139/69 (!) 156/73  Pulse: 94  85 86  Resp: 18  18 16   Temp: 98.2 F (36.8 C)  97.2 F (36.2 C) 97.9 F (36.6 C)  TempSrc: Oral  Oral   SpO2: 96%  95% 96%  Weight:  60.1 kg (132 lb 7.9 oz)    Height:        Intake/Output Summary (Last 24 hours) at 10/10/16 1519 Last data filed at 10/10/16 1132  Gross per 24 hour  Intake                0 ml  Output              725 ml  Net             -725 ml   Filed Weights   10/08/16 0700 10/09/16 0443 10/10/16 0239  Weight: 61.3 kg (135 lb 2.3 oz) 60.3 kg (133 lb) 60.1 kg (132 lb 7.9 oz)    Examination:  General exam: Appears calm and comfortable Respiratory system: Diffuse rhonchi and slightly diminished bilaterally. Respiratory effort normal. Cardiovascular system: S1 & S2 heard, RRR. No murmurs Gastrointestinal system: Abdomen is nondistended, soft and nontender. Normal bowel sounds heard. Central nervous system: alert. aphasia Extremities: No edema. No calf tenderness Skin: No cyanosis. No rashes    Data Reviewed: I have personally reviewed following labs and imaging studies  CBC:  Recent Labs Lab 10/06/16 0815 10/07/16 0421 10/08/16 0603 10/09/16 0500  10/10/16 0531  WBC 9.2 8.9 10.7* 9.6 9.3  HGB 9.8* 10.0* 10.6* 10.5* 11.1*  HCT 30.5* 31.5* 33.4* 33.1* 34.9*  MCV 99.7 100.3* 100.6* 100.3* 99.1  PLT 345 313 343 323 960   Basic Metabolic Panel:  Recent Labs Lab 10/04/16 0514 10/05/16 0458 10/05/16 1000 10/06/16 0815 10/07/16 0421 10/07/16 0742 10/08/16 0603  NA 134* 132*  --  135 136  --  138  K 3.6 2.9*  --  3.3* 3.0*  --  2.9*  CL 96* 94*  --  98* 98*  --  100*  CO2 31 33*  --  32 31  --  30  GLUCOSE 130* 146*  --  122* 131*  --  176*  BUN <5* <5*  --   <5* <5*  --  <5*  CREATININE 0.37* 0.35*  --  0.40* 0.44*  --  0.51*  CALCIUM 8.3* 8.4*  --  8.1* 8.6*  --  8.8*  MG  --   --  1.8  --   --  2.1  --    GFR: Estimated Creatinine Clearance: 55.3 mL/min (A) (by C-G formula based on SCr of 0.51 mg/dL (L)). Liver Function Tests:  Recent Labs Lab 10/04/16 0514  AST 20  ALT 15*  ALKPHOS 67  BILITOT 0.5  PROT 5.6*  ALBUMIN 1.7*   No results for input(s): LIPASE, AMYLASE in the last 168 hours. No results for input(s): AMMONIA in the last 168 hours. Coagulation Profile:  Recent Labs Lab 10/06/16 0513 10/07/16 0421 10/08/16 0603 10/09/16 0500 10/10/16 0531  INR 1.34 1.59 1.79 2.00 1.88   Cardiac Enzymes: No results for input(s): CKTOTAL, CKMB, CKMBINDEX, TROPONINI in the last 168 hours. BNP (last 3 results) No results for input(s): PROBNP in the last 8760 hours. HbA1C: No results for input(s): HGBA1C in the last 72 hours. CBG:  Recent Labs Lab 10/09/16 2105 10/10/16 0008 10/10/16 0419 10/10/16 0756 10/10/16 1235  GLUCAP 140* 158* 114* 140* 160*   Lipid Profile: No results for input(s): CHOL, HDL, LDLCALC, TRIG, CHOLHDL, LDLDIRECT in the last 72 hours. Thyroid Function Tests: No results for input(s): TSH, T4TOTAL, FREET4, T3FREE, THYROIDAB in the last 72 hours. Anemia Panel: No results for input(s): VITAMINB12, FOLATE, FERRITIN, TIBC, IRON, RETICCTPCT in the last 72 hours. Sepsis Labs: No results for input(s): PROCALCITON, LATICACIDVEN in the last 168 hours.  Recent Results (from the past 240 hour(s))  Body fluid culture     Status: None   Collection Time: 10/02/16  2:23 PM  Result Value Ref Range Status   Specimen Description FLUID RIGHT PLEURAL  Final   Special Requests NONE  Final   Gram Stain   Final    ABUNDANT WBC PRESENT,BOTH PMN AND MONONUCLEAR NO ORGANISMS SEEN    Culture NO GROWTH 3 DAYS  Final   Report Status 10/06/2016 FINAL  Final         Radiology Studies: Dg Chest Port 1 View  Result  Date: 10/09/2016 CLINICAL DATA:  Healthcare associated pneumonia, history of diabetes, previous CVA, former smoker. EXAM: PORTABLE CHEST 1 VIEW COMPARISON:  Portable chest x-ray of October 06, 2016 FINDINGS: The lungs remain mildly hypoinflated but on the left the aeration has improved slightly. There are bibasilar densities consistent with atelectasis or pneumonia. There are bilateral pleural effusions. The cardiac silhouette is mildly enlarged. The central pulmonary vascularity is prominent. The left-sided PICC line tip projects over the distal third of the SVC. IMPRESSION: Bibasilar atelectasis or pneumonia with pleural effusions,  relatively stable. Stable enlargement of the cardiac silhouette with central pulmonary vascular prominence. Thoracic aortic atherosclerosis. Electronically Signed   By: David  Martinique M.D.   On: 10/09/2016 07:06        Scheduled Meds: . Chlorhexidine Gluconate Cloth  6 each Topical Daily  . docusate  100 mg Oral BID  . feeding supplement (ENSURE ENLIVE)  237 mL Oral BID BM  . insulin aspart  0-9 Units Subcutaneous Q4H  . mouth rinse  15 mL Mouth Rinse BID  . mirtazapine  30 mg Oral QHS  . senna  2 tablet Oral Daily  . sodium chloride flush  10-40 mL Intracatheter Q12H  . warfarin  5 mg Oral ONCE-1800  . Warfarin - Pharmacist Dosing Inpatient   Does not apply q1800   Continuous Infusions: . dextrose 20 mL/hr at 10/08/16 1715     LOS: 11 days     Cordelia Poche, MD [facility] 10/10/2016, 3:19 PM Pager: [phone]  If 7PM-7AM, please contact night-coverage [email or web address] Password [email or web address] 10/10/2016, 3:19 PM

## 2016-10-11 DIAGNOSIS — G9340 Encephalopathy, unspecified: Secondary | ICD-10-CM

## 2016-10-11 LAB — GLUCOSE, CAPILLARY
GLUCOSE-CAPILLARY: 126 mg/dL — AB (ref 65–99)
GLUCOSE-CAPILLARY: 202 mg/dL — AB (ref 65–99)
Glucose-Capillary: 119 mg/dL — ABNORMAL HIGH (ref 65–99)
Glucose-Capillary: 141 mg/dL — ABNORMAL HIGH (ref 65–99)
Glucose-Capillary: 142 mg/dL — ABNORMAL HIGH (ref 65–99)
Glucose-Capillary: 210 mg/dL — ABNORMAL HIGH (ref 65–99)

## 2016-10-11 LAB — PROTIME-INR
INR: 1.69
Prothrombin Time: 20.1 seconds — ABNORMAL HIGH (ref 11.4–15.2)

## 2016-10-11 LAB — CBC
HCT: 34.6 % — ABNORMAL LOW (ref 39.0–52.0)
Hemoglobin: 11 g/dL — ABNORMAL LOW (ref 13.0–17.0)
MCH: 31.8 pg (ref 26.0–34.0)
MCHC: 31.8 g/dL (ref 30.0–36.0)
MCV: 100 fL (ref 78.0–100.0)
PLATELETS: 280 10*3/uL (ref 150–400)
RBC: 3.46 MIL/uL — AB (ref 4.22–5.81)
RDW: 14.1 % (ref 11.5–15.5)
WBC: 8.9 10*3/uL (ref 4.0–10.5)

## 2016-10-11 LAB — BASIC METABOLIC PANEL
Anion gap: 10 (ref 5–15)
BUN: 5 mg/dL — AB (ref 6–20)
CO2: 31 mmol/L (ref 22–32)
CREATININE: 0.43 mg/dL — AB (ref 0.61–1.24)
Calcium: 9.2 mg/dL (ref 8.9–10.3)
Chloride: 95 mmol/L — ABNORMAL LOW (ref 101–111)
GFR calc Af Amer: >60 mL/min (ref >60)
Glucose, Bld: 139 mg/dL — ABNORMAL HIGH (ref 65–99)
POTASSIUM: 2.6 mmol/L — AB (ref 3.5–5.1)
Sodium: 136 mmol/L (ref 135–145)

## 2016-10-11 LAB — HEPARIN LEVEL (UNFRACTIONATED): Heparin Unfractionated: 0.66 IU/mL (ref 0.30–0.70)

## 2016-10-11 MED ORDER — POTASSIUM CHLORIDE 20 MEQ/15ML (10%) PO SOLN
40.0000 meq | ORAL | Status: AC
  Administered 2016-10-11 (×2): 40 meq via ORAL
  Filled 2016-10-11 (×2): qty 30

## 2016-10-11 MED ORDER — HEPARIN (PORCINE) IN NACL 100-0.45 UNIT/ML-% IJ SOLN
900.0000 [IU]/h | INTRAMUSCULAR | Status: DC
  Administered 2016-10-11: 1200 [IU]/h via INTRAVENOUS
  Administered 2016-10-12: 1050 [IU]/h via INTRAVENOUS
  Filled 2016-10-11 (×2): qty 250

## 2016-10-11 NOTE — Progress Notes (Signed)
ANTICOAGULATION CONSULT NOTE   Pharmacy Consult for Heparin Indication: atrial fibrillation and acute CVA  No Known Allergies  Patient Measurements: Height: 5\' 5"  (165.1 cm) Weight: 125 lb 7.1 oz (56.9 kg) IBW/kg (Calculated) : 61.5   Vital Signs: Temp: 98.4 F (36.9 C) (04/07 1504) BP: 156/74 (04/07 1504) Pulse Rate: 91 (04/07 1504)  Labs:  Recent Labs  10/09/16 0500 10/10/16 0531 10/11/16 0543 10/11/16 1723  HGB 10.5* 11.1* 11.0*  --   HCT 33.1* 34.9* 34.6*  --   PLT 323 308 280  --   LABPROT 23.0* 21.9* 20.1*  --   INR 2.00 1.88 1.69  --   HEPARINUNFRC 0.46  --   --  0.66  CREATININE  --   --  0.43*  --     Estimated Creatinine Clearance: 52.4 mL/min (A) (by C-G formula based on SCr of 0.43 mg/dL (L)).   Assessment: 83 yo male admitted and found to have atrial fibrillation and acute CVA. Warfarin was started with a heparin bridge 10/04/16. Heparin with d/c'd with an INR of 2 on 10/09/16. INR dropped subtherapeutic on 4/6 AM, remains subtherapeutic today at 1.69.   Transitioned to IV heparin earlier today due to need to hold warfarin in anticipation of GTube placement. Heparin started at 1200 units/hr as patient was previously therapeutic on this rate- level resulted above goal at 0.66 units/mL.  CBC stable, no bleeding noted.  Goal of Therapy:  Heparin goal 0.3-0.5 Monitor platelets by anticoagulation protocol: Yes   Plan:  -decrease heparin to 1050units/hr  -daily heparin levels, CBC -follow up plan for [facility]   Shanequia Kendrick D. Andruw Battie, PharmD, BCPS Clinical Pharmacist Pager: [phone] 10/11/2016 5:46 PM

## 2016-10-11 NOTE — Progress Notes (Signed)
CRITICAL VALUE ALERT  Critical value received:  Potassium 2.6  Date of notification:  10/11/16  Time of notification:  0710  Critical value read back:Yes.    Nurse who received alert:  Lorenso Courier  MD notified (1st page):  Lonny Prude, MD  Time of first page:  0711  MD notified (2nd page): [place]  Time of second [facility]:3074  Responding MD:  Lonny Prude  Time MD responded:  [phone]  New order for potassium placed

## 2016-10-11 NOTE — Progress Notes (Signed)
PROGRESS NOTE    Stephen Bates  NWG:[id number] DOB: [birth year] DOA: 09/29/2016 PCP: Stephen Noble, MD   Brief Narrative: Stephen Bates is a 83 y.o. male with hx of DM, dementia, prostate CA, and CVA who is bedbound. He presented after being found unresponsive requiring intubation and found to have another acute CVA. Goals of care discussions.   Assessment & Plan:   Principal Problem:   Severe sepsis with septic shock (HCC) Active Problems:   Mild dementia   Vascular parkinsonism (HCC)   Malnutrition of moderate degree   History of right bundle branch block   Diabetes ([place])   Stroke ([place]) history   HCAP (healthcare-associated pneumonia)   Septic shock (HCC)   Acute respiratory failure with hypoxia (HCC)   Encephalopathy   Pleural effusion   Acute CVA (cerebrovascular accident) [facility])   Palliative care encounter   Goals of care, counseling/discussion   Decreased appetite   Septic shock with acute hypoxic respiratory failure secondary to HCAP Resolved. Weaned off of oxygen.  History of percutaneous drain s/p acute cholecystitis Culture significant for coagulase negative staph. Has had 10 days of vancomycin. -outpatient general surgery follow-up  Paroxysmal atrial fibrillation New -restart heparin -hold coumadin for g-tube placement  Acute CVA Expressive aphasia. Stroke team signed off. -hold coumadin in setting of g-tube placement  Dysphagia Secondary to stroke. -dysphagia 1 diet  Chronic anemia Hemoglobin stable.   Diabetes mellitus -continue SSI  Acute encephalopathy Dementia Acute CVA likely contributing. No oral intake. -continue Remeron  Pleural effusions Given lasix with good output -continue lasix PO  Hypokalemia -Replete  Goals of care Palliative care in discussions with family. Considering feeding tube vs comfort care  DVT prophylaxis: Heparin drip Code Status: DNR/DNI Family Communication: None at bedside Disposition Plan:  Discharge pending g tube placement   Consultants:   Palliative care medicine  Neurology  PCCM  Procedures:   None  Antimicrobials:  Vancomycin  Ceftazidime   Subjective: No pain. No dyspnea. Speaking to me a little more today.  Objective: Vitals:   10/10/16 1507 10/10/16 2013 10/11/16 0431 10/11/16 0500  BP: (!) 156/73 (!) 141/80 136/74   Pulse: 86 96 68   Resp: 16 18 18    Temp: 97.9 F (36.6 C) 97.5 F (36.4 C) 98.1 F (36.7 C)   TempSrc:  Oral Oral   SpO2: 96% 94%    Weight:    56.9 kg (125 lb 7.1 oz)  Height:        Intake/Output Summary (Last 24 hours) at 10/11/16 1314 Last data filed at 10/11/16 1023  Gross per 24 hour  Intake           936.94 ml  Output              675 ml  Net           261.94 ml   Filed Weights   10/09/16 0443 10/10/16 0239 10/11/16 0500  Weight: 60.3 kg (133 lb) 60.1 kg (132 lb 7.9 oz) 56.9 kg (125 lb 7.1 oz)    Examination:  General exam: Appears calm and comfortable Respiratory system: Diffuse rhonchi and slightly diminished bilaterally. Respiratory effort normal. Cardiovascular system: S1 & S2 heard, RRR. No murmurs Gastrointestinal system: Abdomen is nondistended, soft and nontender. Normal bowel sounds heard. Central nervous system: alert. Cannot assess orientation secondary to aphasia Extremities: No edema. No calf tenderness Skin: No cyanosis. No rashes   Data Reviewed: I have personally reviewed following labs and imaging studies  CBC:  Recent Labs Lab 10/07/16 0421 10/08/16 0603 10/09/16 0500 10/10/16 0531 10/11/16 0543  WBC 8.9 10.7* 9.6 9.3 8.9  HGB 10.0* 10.6* 10.5* 11.1* 11.0*  HCT 31.5* 33.4* 33.1* 34.9* 34.6*  MCV 100.3* 100.6* 100.3* 99.1 100.0  PLT 313 343 323 308 767   Basic Metabolic Panel:  Recent Labs Lab 10/05/16 0458 10/05/16 1000 10/06/16 0815 10/07/16 0421 10/07/16 0742 10/08/16 0603 10/11/16 0543  NA 132*  --  135 136  --  138 136  K 2.9*  --  3.3* 3.0*  --  2.9* 2.6*  CL  94*  --  98* 98*  --  100* 95*  CO2 33*  --  32 31  --  30 31  GLUCOSE 146*  --  122* 131*  --  176* 139*  BUN <5*  --  <5* <5*  --  <5* 5*  CREATININE 0.35*  --  0.40* 0.44*  --  0.51* 0.43*  CALCIUM 8.4*  --  8.1* 8.6*  --  8.8* 9.2  MG  --  1.8  --   --  2.1  --   --    GFR: Estimated Creatinine Clearance: 52.4 mL/min (A) (by C-G formula based on SCr of 0.43 mg/dL (L)). Liver Function Tests: No results for input(s): AST, ALT, ALKPHOS, BILITOT, PROT, ALBUMIN in the last 168 hours. No results for input(s): LIPASE, AMYLASE in the last 168 hours. No results for input(s): AMMONIA in the last 168 hours. Coagulation Profile:  Recent Labs Lab 10/07/16 0421 10/08/16 0603 10/09/16 0500 10/10/16 0531 10/11/16 0543  INR 1.59 1.79 2.00 1.88 1.69   Cardiac Enzymes: No results for input(s): CKTOTAL, CKMB, CKMBINDEX, TROPONINI in the last 168 hours. BNP (last 3 results) No results for input(s): PROBNP in the last 8760 hours. HbA1C: No results for input(s): HGBA1C in the last 72 hours. CBG:  Recent Labs Lab 10/10/16 2010 10/11/16 0016 10/11/16 0429 10/11/16 0759 10/11/16 1152  GLUCAP 161* 142* 119* 126* 141*   Lipid Profile: No results for input(s): CHOL, HDL, LDLCALC, TRIG, CHOLHDL, LDLDIRECT in the last 72 hours. Thyroid Function Tests: No results for input(s): TSH, T4TOTAL, FREET4, T3FREE, THYROIDAB in the last 72 hours. Anemia Panel: No results for input(s): VITAMINB12, FOLATE, FERRITIN, TIBC, IRON, RETICCTPCT in the last 72 hours. Sepsis Labs: No results for input(s): PROCALCITON, LATICACIDVEN in the last 168 hours.  Recent Results (from the past 240 hour(s))  Body fluid culture     Status: None   Collection Time: 10/02/16  2:23 PM  Result Value Ref Range Status   Specimen Description FLUID RIGHT PLEURAL  Final   Special Requests NONE  Final   Gram Stain   Final    ABUNDANT WBC PRESENT,BOTH PMN AND MONONUCLEAR NO ORGANISMS SEEN    Culture NO GROWTH 3 DAYS  Final    Report Status 10/06/2016 FINAL  Final         Radiology Studies: No results found.      Scheduled Meds: . Chlorhexidine Gluconate Cloth  6 each Topical Daily  . docusate  100 mg Oral BID  . feeding supplement (ENSURE ENLIVE)  237 mL Oral BID BM  . insulin aspart  0-9 Units Subcutaneous Q4H  . mouth rinse  15 mL Mouth Rinse BID  . mirtazapine  30 mg Oral QHS  . potassium chloride  40 mEq Oral Q4H  . senna  2 tablet Oral Daily  . sodium chloride flush  10-40 mL Intracatheter Q12H   Continuous Infusions: .  dextrose 20 mL/hr at 10/10/16 1814  . heparin 1,200 Units/hr (10/11/16 0830)     LOS: 12 days     Cordelia Poche, MD [facility] 10/11/2016, 1:14 PM Pager: [phone]  If 7PM-7AM, please contact night-coverage [email or web address] Password [email or web address] 10/11/2016, 1:14 PM

## 2016-10-11 NOTE — Progress Notes (Signed)
[place] for Warfarin > Heparin for subtherapeutic INR Indication: atrial fibrillation and acute CVA  No Known Allergies  Patient Measurements: Height: 5\' 5"  (165.1 cm) Weight: 125 lb 7.1 oz (56.9 kg) IBW/kg (Calculated) : 61.5   Vital Signs: Temp: 98.1 F (36.7 C) (04/07 0431) Temp Source: Oral (04/07 0431) BP: 136/74 (04/07 0431) Pulse Rate: 68 (04/07 0431)  Labs:  Recent Labs  10/08/16 1228  10/09/16 0500 10/10/16 0531 10/11/16 0543  HGB  --   < > 10.5* 11.1* 11.0*  HCT  --   --  33.1* 34.9* 34.6*  PLT  --   --  323 308 280  LABPROT  --   --  23.0* 21.9* 20.1*  INR  --   --  2.00 1.88 1.69  HEPARINUNFRC 0.39  --  0.46  --   --   CREATININE  --   --   --   --  0.43*  < > = values in this interval not displayed.  Estimated Creatinine Clearance: 52.4 mL/min (A) (by C-G formula based on SCr of 0.43 mg/dL (L)).   Assessment: 83 yo male admitted and found to have atrial fibrillation and acute CVA. Warfarin was started with a heparin bridge 10/04/16. Heparin with d/c'd with an INR of 2 on 10/09/16. INR dropped subtherapeutic on 4/6 AM, remains subtherapeutic today. Noted patient with poor intake which could be affecting INR trend. Pharmacy now consulted to start heparin and d/c warfarin.   Hgb and platelets are stable, no bleeding noted. Heparin levels previously therapeutic on 1200 units/hr. Will dose to reduced heparin level goal given acute stroke.   Goal of Therapy:  Heparin goal 0.3-0.5 Monitor platelets by anticoagulation protocol: Yes   Plan:  -D/c warfarin -Heparin 1200 units/hr without bolus -8 hr heparin level -daily heparin levels, CBC -follow up plan for [place], Pharm.D. PGY1 Pharmacy Resident 4/7/20187:47 AM Pager [phone]

## 2016-10-12 ENCOUNTER — Encounter (HOSPITAL_COMMUNITY): Payer: Self-pay | Admitting: Physician Assistant

## 2016-10-12 LAB — BASIC METABOLIC PANEL
Anion gap: 8 (ref 5–15)
BUN: 5 mg/dL — ABNORMAL LOW (ref 6–20)
CHLORIDE: 96 mmol/L — AB (ref 101–111)
CO2: 31 mmol/L (ref 22–32)
Calcium: 9 mg/dL (ref 8.9–10.3)
Creatinine, Ser: 0.47 mg/dL — ABNORMAL LOW (ref 0.61–1.24)
GFR calc Af Amer: >60 mL/min (ref >60)
GFR calc non Af Amer: >60 mL/min (ref >60)
Glucose, Bld: 142 mg/dL — ABNORMAL HIGH (ref 65–99)
POTASSIUM: 3.1 mmol/L — AB (ref 3.5–5.1)
SODIUM: 135 mmol/L (ref 135–145)

## 2016-10-12 LAB — CBC
HEMATOCRIT: 35.3 % — AB (ref 39.0–52.0)
Hemoglobin: 11.3 g/dL — ABNORMAL LOW (ref 13.0–17.0)
MCH: 31.9 pg (ref 26.0–34.0)
MCHC: 32 g/dL (ref 30.0–36.0)
MCV: 99.7 fL (ref 78.0–100.0)
Platelets: 251 10*3/uL (ref 150–400)
RBC: 3.54 MIL/uL — ABNORMAL LOW (ref 4.22–5.81)
RDW: 13.9 % (ref 11.5–15.5)
WBC: 11.3 10*3/uL — AB (ref 4.0–10.5)

## 2016-10-12 LAB — GLUCOSE, CAPILLARY
GLUCOSE-CAPILLARY: 132 mg/dL — AB (ref 65–99)
GLUCOSE-CAPILLARY: 162 mg/dL — AB (ref 65–99)
GLUCOSE-CAPILLARY: 179 mg/dL — AB (ref 65–99)
Glucose-Capillary: 139 mg/dL — ABNORMAL HIGH (ref 65–99)
Glucose-Capillary: 141 mg/dL — ABNORMAL HIGH (ref 65–99)
Glucose-Capillary: 175 mg/dL — ABNORMAL HIGH (ref 65–99)
Glucose-Capillary: 166 mg/dL — ABNORMAL HIGH (ref 65–99)

## 2016-10-12 LAB — MAGNESIUM: MAGNESIUM: 1.8 mg/dL (ref 1.7–2.4)

## 2016-10-12 LAB — HEPARIN LEVEL (UNFRACTIONATED)
Heparin Unfractionated: 0.54 IU/mL (ref 0.30–0.70)
Heparin Unfractionated: 0.59 IU/mL (ref 0.30–0.70)

## 2016-10-12 MED ORDER — BENAZEPRIL HCL 40 MG PO TABS
40.0000 mg | ORAL_TABLET | Freq: Every morning | ORAL | Status: DC
  Administered 2016-10-12 – 2016-10-14 (×2): 40 mg via ORAL
  Filled 2016-10-12 (×3): qty 1

## 2016-10-12 MED ORDER — POTASSIUM CHLORIDE CRYS ER 20 MEQ PO TBCR
40.0000 meq | EXTENDED_RELEASE_TABLET | ORAL | Status: AC
  Administered 2016-10-12 (×2): 40 meq via ORAL
  Filled 2016-10-12 (×2): qty 2

## 2016-10-12 MED ORDER — BENAZEPRIL HCL 40 MG PO TABS: 40.0000 mg | ORAL_TABLET | Freq: Every morning | ORAL | Status: DC

## 2016-10-12 NOTE — Progress Notes (Signed)
PROGRESS NOTE    Stephen Bates  HWE:[id number] DOB: [birth year] DOA: 09/29/2016 PCP: Asencion Noble, MD   Brief Narrative: Stephen Bates is a 83 y.o. male with hx of DM, dementia, prostate CA, and CVA who is bedbound. He presented after being found unresponsive requiring intubation and found to have another acute CVA. Goals of care discussions.   Assessment & Plan:   Principal Problem:   Severe sepsis with septic shock (HCC) Active Problems:   Mild dementia   Vascular parkinsonism (HCC)   Malnutrition of moderate degree   History of right bundle branch block   Diabetes ([place])   Stroke ([place]) history   HCAP (healthcare-associated pneumonia)   Septic shock (HCC)   Acute respiratory failure with hypoxia (HCC)   Encephalopathy   Pleural effusion   Acute CVA (cerebrovascular accident) [facility])   Palliative care encounter   Goals of care, counseling/discussion   Decreased appetite   Septic shock with acute hypoxic respiratory failure secondary to HCAP Resolved. Weaned off of oxygen.  History of percutaneous drain s/p acute cholecystitis Culture significant for coagulase negative staph. Has had 10 days of vancomycin. -outpatient general surgery follow-up  Paroxysmal atrial fibrillation New -continue heparin; radiology to hold for tube placement -hold coumadin for g-tube placement  Acute CVA Expressive aphasia. Stroke team signed off. -hold coumadin in setting of g-tube placement -G-tube per IR on 4/9  Dysphagia Secondary to stroke. -dysphagia 1 diet -G-tube as above  Chronic anemia Hemoglobin stable.  Diabetes mellitus -continue SSI  Acute encephalopathy Dementia Acute CVA likely contributing. No oral intake. -continue Remeron  Pleural effusions Given lasix with good output -continue lasix PO  Hypokalemia -Replete  Goals of care Palliative care in discussions with family. G-tube and home hospice  DVT prophylaxis: Heparin drip Code Status:  DNR/DNI Family Communication: None at bedside Disposition Plan: Discharge pending g tube placement with plan for home with hospice   Consultants:   Palliative care medicine  Neurology  PCCM  Procedures:   None  Antimicrobials:  Vancomycin  Ceftazidime   Subjective: Sleepy. No complaints of pain.  Objective: Vitals:   10/11/16 1504 10/11/16 2233 10/12/16 0607 10/12/16 0748  BP: (!) 156/74 (!) 160/68 (!) 159/78   Pulse: 91 86 80   Resp: 20 18 18    Temp: 98.4 F (36.9 C) 98.6 F (37 C) 97.6 F (36.4 C)   TempSrc:      SpO2: 95% 99% 100%   Weight:    57.3 kg (126 lb 6.4 oz)  Height:        Intake/Output Summary (Last 24 hours) at 10/12/16 1151 Last data filed at 10/12/16 1053  Gross per 24 hour  Intake           759.85 ml  Output              175 ml  Net           584.85 ml   Filed Weights   10/10/16 0239 10/11/16 0500 10/12/16 0748  Weight: 60.1 kg (132 lb 7.9 oz) 56.9 kg (125 lb 7.1 oz) 57.3 kg (126 lb 6.4 oz)    Examination:  General exam: Appears calm and comfortable Respiratory system: Diffuse rhonchi and slightly diminished bilaterally. Respiratory effort normal. Cardiovascular system: S1 & S2 heard, RRR. No murmurs Gastrointestinal system: Abdomen is nondistended, soft and nontender. Normal bowel sounds heard. Central nervous system: Somnolent Extremities: No edema. No calf tenderness Skin: No cyanosis. No rashes   Data Reviewed: I have personally  reviewed following labs and imaging studies  CBC:  Recent Labs Lab 10/08/16 0603 10/09/16 0500 10/10/16 0531 10/11/16 0543 10/12/16 0504  WBC 10.7* 9.6 9.3 8.9 11.3*  HGB 10.6* 10.5* 11.1* 11.0* 11.3*  HCT 33.4* 33.1* 34.9* 34.6* 35.3*  MCV 100.6* 100.3* 99.1 100.0 99.7  PLT 343 323 308 280 616   Basic Metabolic Panel:  Recent Labs Lab 10/06/16 0815 10/07/16 0421 10/07/16 0742 10/08/16 0603 10/11/16 0543 10/12/16 0504  NA 135 136  --  138 136 135  K 3.3* 3.0*  --  2.9* 2.6*  3.1*  CL 98* 98*  --  100* 95* 96*  CO2 32 31  --  30 31 31   GLUCOSE 122* 131*  --  176* 139* 142*  BUN <5* <5*  --  <5* 5* 5*  CREATININE 0.40* 0.44*  --  0.51* 0.43* 0.47*  CALCIUM 8.1* 8.6*  --  8.8* 9.2 9.0  MG  --   --  2.1  --   --  1.8   GFR: Estimated Creatinine Clearance: 52.7 mL/min (A) (by C-G formula based on SCr of 0.47 mg/dL (L)). Liver Function Tests: No results for input(s): AST, ALT, ALKPHOS, BILITOT, PROT, ALBUMIN in the last 168 hours. No results for input(s): LIPASE, AMYLASE in the last 168 hours. No results for input(s): AMMONIA in the last 168 hours. Coagulation Profile:  Recent Labs Lab 10/07/16 0421 10/08/16 0603 10/09/16 0500 10/10/16 0531 10/11/16 0543  INR 1.59 1.79 2.00 1.88 1.69   Cardiac Enzymes: No results for input(s): CKTOTAL, CKMB, CKMBINDEX, TROPONINI in the last 168 hours. BNP (last 3 results) No results for input(s): PROBNP in the last 8760 hours. HbA1C: No results for input(s): HGBA1C in the last 72 hours. CBG:  Recent Labs Lab 10/11/16 1723 10/11/16 2022 10/12/16 0034 10/12/16 0404 10/12/16 0758  GLUCAP 210* 202* 132* 139* 141*   Lipid Profile: No results for input(s): CHOL, HDL, LDLCALC, TRIG, CHOLHDL, LDLDIRECT in the last 72 hours. Thyroid Function Tests: No results for input(s): TSH, T4TOTAL, FREET4, T3FREE, THYROIDAB in the last 72 hours. Anemia Panel: No results for input(s): VITAMINB12, FOLATE, FERRITIN, TIBC, IRON, RETICCTPCT in the last 72 hours. Sepsis Labs: No results for input(s): PROCALCITON, LATICACIDVEN in the last 168 hours.  Recent Results (from the past 240 hour(s))  Body fluid culture     Status: None   Collection Time: 10/02/16  2:23 PM  Result Value Ref Range Status   Specimen Description FLUID RIGHT PLEURAL  Final   Special Requests NONE  Final   Gram Stain   Final    ABUNDANT WBC PRESENT,BOTH PMN AND MONONUCLEAR NO ORGANISMS SEEN    Culture NO GROWTH 3 DAYS  Final   Report Status 10/06/2016  FINAL  Final         Radiology Studies: No results found.      Scheduled Meds: . Chlorhexidine Gluconate Cloth  6 each Topical Daily  . docusate  100 mg Oral BID  . feeding supplement (ENSURE ENLIVE)  237 mL Oral BID BM  . insulin aspart  0-9 Units Subcutaneous Q4H  . mouth rinse  15 mL Mouth Rinse BID  . mirtazapine  30 mg Oral QHS  . senna  2 tablet Oral Daily  . sodium chloride flush  10-40 mL Intracatheter Q12H   Continuous Infusions: . dextrose 20 mL/hr at 10/10/16 1814  . heparin 1,050 Units/hr (10/12/16 0552)     LOS: 13 days     Cordelia Poche, MD [facility]  10/12/2016, 11:51 AM Pager: [phone]  If 7PM-7AM, please contact night-coverage [email or web address] Password [facility] 10/12/2016, 11:51 AM

## 2016-10-12 NOTE — Progress Notes (Signed)
ANTICOAGULATION CONSULT NOTE - Follow Up Consult  Pharmacy Consult for heparin Indication: atrial fibrillation in setting of acute CVA  Labs:  Recent Labs  10/10/16 0531 10/11/16 0543 10/11/16 1723 10/12/16 0504  HGB 11.1* 11.0*  --  11.3*  HCT 34.9* 34.6*  --  35.3*  PLT 308 280  --  251  LABPROT 21.9* 20.1*  --   --   INR 1.88 1.69  --   --   HEPARINUNFRC  --   --  0.66 0.54  CREATININE  --  0.43*  --  0.47*    Assessment: 83yo male remains slightly above goal on heparin after rate change.  Goal of Therapy:  Heparin level 0.3-0.5 units/ml   Plan:  Will decrease heparin gtt slightly to 1000 units/hr and check level in [place], PharmD, BCPS  10/12/2016,6:12 AM

## 2016-10-12 NOTE — H&P (Signed)
Chief Complaint: Inanition  Referring Physician(s): Cordelia Poche  Supervising Physician: Daryll Brod  Patient Status: [facility] - In-pt  History of Present Illness: Stephen Bates is a 83 y.o. male was also recently admitted 3/14 - 3/21 to [facility] for cholecystitis.   He was treated with IV antibiotics (Zosyn) and and he underwent a cholecystostomy tube placement by Dr. Kathlene Cote.   Hospital course complicated by paroxysmal a-fib, which resolved spontaneously.   He was discharged to home with home health nursing.   On 3/26 he presented to ED after being found unresponsive at home.   He remained unresponsive in ED and was intubated for airway protection.  He was also started on norepinephrine for shock.   CXR concerning for multifocal PNA.   He was transferred to [facility] for ICU care.  It appears he had another acute CVA.  Other medical issues include DM and prostate cancer.   We are asked to place a gastrostomy tube.  He is on coumadin for A-fib/Stroke and this is being held and he is bridged with Heparin drip.  Past Medical History:  Diagnosis Date  . Arthritis   . Benign neoplasm of colon   . Chronic back pain   . Diabetes ([place])   . Frequent falls 2012  . History of right bundle branch block   . Hypertension   . Lumbar radiculopathy   . Mild dementia   . Neuropathy ([place])   . Prostate cancer ([place])   . Stroke ([place]) 1982  . Weakness of both legs   . Wears glasses     Past Surgical History:  Procedure Laterality Date  . COLONOSCOPY    . ERCP N/A 05/24/2015   Procedure: ENDOSCOPIC RETROGRADE CHOLANGIOPANCREATOGRAPHY;  Surgeon: Rogene Houston, MD;  Location: [facility] ORS;  Service: Endoscopy;  Laterality: N/A;  . EYE SURGERY  2010   both cataracts  . INSERTION PROSTATE RADIATION SEED  2001  . IR GENERIC HISTORICAL  09/22/2016   IR PERC CHOLECYSTOSTOMY 09/22/2016 Aletta Edouard, MD [facility]-INTERV RAD  . kyphoplasty    . MOHS SURGERY  12/12/2008  .  SPHINCTEROTOMY N/A 05/24/2015   Procedure: BILIARY SPHINCTEROTOMY;  Surgeon: Rogene Houston, MD;  Location: [facility] ORS;  Service: Endoscopy;  Laterality: N/A;    Allergies: Patient has no known allergies.  Medications: Prior to Admission medications   Medication Sig Start Date End Date Taking? Authorizing Provider  acetaminophen (TYLENOL) 325 MG tablet Take 650 mg by mouth once as needed for mild pain or moderate pain.   Yes Historical Provider, MD  amoxicillin-clavulanate (AUGMENTIN) 875-125 MG tablet Take 1 tablet by mouth 2 (two) times daily. 09/24/16  Yes Asencion Noble, MD  aspirin EC 81 MG tablet Take 81 mg by mouth daily.   Yes Historical Provider, MD  benazepril (LOTENSIN) 40 MG tablet Take 1 tablet by mouth every morning.  10/04/13  Yes Historical Provider, MD  Cholecalciferol (VITAMIN D PO) Take 1 capsule by mouth daily.   Yes Historical Provider, MD  docusate sodium (COLACE) 100 MG capsule Take 100 mg by mouth daily as needed for mild constipation.    Yes Historical Provider, MD  metFORMIN (GLUCOPHAGE-XR) 500 MG 24 hr tablet Take 500 mg by mouth 2 (two) times daily.  08/22/15  Yes Historical Provider, MD  mirtazapine (REMERON) 30 MG tablet Take 30 mg by mouth at bedtime.   Yes Historical Provider, MD  Multiple Vitamins-Minerals (CENTRUM SILVER ADULT 50+ PO) Take 1 tablet by mouth every morning.  Yes Historical Provider, MD  oseltamivir (TAMIFLU) 75 MG capsule Take 75 mg by mouth 2 (two) times daily. 5 day course 09/27/16  Yes Historical Provider, MD  polyethylene glycol (MIRALAX / GLYCOLAX) packet Take 17 g by mouth daily as needed for mild constipation.    Yes Historical Provider, MD  potassium chloride (K-DUR) 10 MEQ tablet Take 1 tablet (10 mEq total) by mouth 2 (two) times daily. 09/24/16  Yes Asencion Noble, MD  prochlorperazine (COMPAZINE) 10 MG tablet Take by mouth every 8 (eight) hours as needed for nausea or vomiting.  09/26/16  Yes Historical Provider, MD  triamcinolone cream (KENALOG) 0.1  % Apply 1 application topically 2 (two) times daily. To head 09/09/16  Yes Historical Provider, MD     Family History  Problem Relation Age of Onset  . Bladder Cancer Father   . Bronchopulmonary dysplasia Mother   . Colon cancer Sister     Social History   Social History  . Marital status: Married    Spouse name: Mary  . Number of children: 2  . Years of education: HS   Occupational History  . Retired Physiological scientist   Social History Main Topics  . Smoking status: Former Smoker    Quit date: 06/08/1969  . Smokeless tobacco: Never Used  . Alcohol use 1.8 oz/week    3 Glasses of wine per week     Comment: "3 glasses of wine a day"  . Drug use: No  . Sexual activity: Not Asked   Other Topics Concern  . None   Social History Narrative   Lives with wife in one story home.  2 daughters.  Education: 11th grade.  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          ECOG Status: 4 - Bedbound   Review of Systems  Unable to perform ROS: Dementia    Vital Signs: BP (!) 159/78 (BP Location: Left Leg)   Pulse 80   Temp 97.6 F (36.4 C)   Resp 18   Ht 5\' 5"  (1.651 m)   Wt 126 lb 6.4 oz (57.3 kg)   SpO2 100%   BMI 21.03 kg/m   Physical Exam  Constitutional: He appears well-developed.  HENT:  Head: Normocephalic.  Patient will not open mouth. Attempted to check airway by coaxing with a spoon, patient bit the spoon. Dressing in place on scalp  Cardiovascular: Normal rate, regular rhythm and normal heart sounds.   Pulmonary/Chest: Effort normal.  + rhonchi bilaterally  Abdominal: Soft. He exhibits no distension. There is no tenderness.  Neurological:  + Dementia,  Somnolent, difficult to arouse  Skin: Skin is warm and dry.  Vitals reviewed.   Mallampati Score:  MD Evaluation Airway: WNL Heart: WNL Abdomen: Other (comments) Abdomen comments: mepilex dressing on abdomen Chest/ Lungs: WNL ASA  Classification: 4 Mallampati/Airway  Score:  (Dementia, would not open mouth)  Imaging: Ct Head Wo Contrast  Result Date: 10/03/2016 CLINICAL DATA:  Acute stroke EXAM: CT HEAD WITHOUT CONTRAST TECHNIQUE: Contiguous axial images were obtained from the base of the skull through the vertex without intravenous contrast. COMPARISON:  None. FINDINGS: Brain: Large area of low attenuation with effacement of the normal gray-white differentiation in the left parietal lobe most concerning for an acute or subacute cerebral infarct. No evidence of acute hemorrhage, extra-axial collection, ventriculomegaly, or mass effect. Generalized cerebral atrophy. Periventricular white matter low attenuation likely secondary to microangiopathy. Vascular: Cerebrovascular atherosclerotic calcifications  are noted. Skull: Negative for fracture or focal lesion. Sinuses/Orbits: Visualized portions of the orbits are unremarkable. Mild right maxillary sinus mucosal thickening. Small right mastoid effusion. Other: None. IMPRESSION: 1. Acute versus subacute nonhemorrhagic left parietal lobe (left MCA territory) infarct. Electronically Signed   By: Kathreen Devoid   On: 10/03/2016 15:12   Ct Chest Wo Contrast  Result Date: 10/01/2016 CLINICAL DATA:  Follow-up pleural effusions. Prior thoracentesis. Initial encounter. EXAM: CT CHEST WITHOUT CONTRAST TECHNIQUE: Multidetector CT imaging of the chest was performed following the standard protocol without IV contrast. COMPARISON:  Chest radiograph performed earlier today at 7:04 p.m. FINDINGS: Cardiovascular: Diffuse coronary artery calcifications are seen. The heart remains normal in size. The left PICC is noted ending about the proximal right atrium. Scattered calcification is seen along the thoracic aorta and proximal great vessels. Mediastinum/Nodes: Visualized mediastinal nodes are borderline normal in size. Trace pericardial fluid remains within normal limits. The thyroid gland are grossly unremarkable. No axillary lymphadenopathy  is seen. Lungs/Pleura: Moderate to large right and small left pleural effusions are noted, with partial consolidation of both lower lung lobes. Mild peripheral airspace opacities are noted bilaterally, possibly reflecting a mild infectious process. No pneumothorax is seen. No masses are identified. Upper Abdomen: A cholecystostomy tube is noted. The gallbladder is decompressed and not well characterized. A 2.9 cm cystic focus is noted along the anterior aspect of the liver. A calcified granuloma is seen within the liver. The spleen is grossly unremarkable. Trace ascites is seen tracking about the liver and spleen. The visualized portions of the pancreas, adrenal glands and kidneys are within normal limits. Nonspecific perinephric stranding is noted bilaterally. Scattered calcification is seen along the proximal abdominal aorta and its branches. Musculoskeletal: No acute osseous abnormalities are identified. Anterior bridging osteophytes are seen along the thoracic spine. The patient is status post vertebroplasty along the lower thoracic spine, at T11-L1. The visualized musculature is unremarkable in appearance. IMPRESSION: 1. Moderate to large right and small left pleural effusions, with partial consolidation of both lower lung lobes. Mild peripheral airspace opacities seen bilaterally, possibly reflecting a mild infectious process. 2. Trace ascites noted tracking about the liver and spleen. 3. Diffuse coronary artery calcifications seen. 4. Scattered aortic atherosclerosis. 5. 2.9 cm cystic focus along the anterior aspect of the liver. This is nonspecific but may reflect a cyst. Electronically Signed   By: Garald Balding M.D.   On: 10/01/2016 22:24   Ct Abdomen Pelvis W Contrast  Result Date: 09/17/2016 CLINICAL DATA:  Right-sided abdominal pain with history of prostate cancer EXAM: CT ABDOMEN AND PELVIS WITH CONTRAST TECHNIQUE: Multidetector CT imaging of the abdomen and pelvis was performed using the standard  protocol following bolus administration of intravenous contrast. CONTRAST:  4mL ISOVUE-300 IOPAMIDOL (ISOVUE-300) INJECTION 61%, 164mL ISOVUE-300 IOPAMIDOL (ISOVUE-300) INJECTION 61% COMPARISON:  08/24/2015, 05/22/2015 FINDINGS: Lower chest: Small right-sided pleural effusion. Atelectasis at the left lung base. Partial consolidation in the right lower lobe, atelectasis versus infiltrate. Coronary artery calcifications. No large pericardial effusion. Hepatobiliary: Mild intra hepatic biliary dilatation. There are multiple calcified stones within the gallbladder lumen. There appears to be gallbladder wall thickening. There is edema and soft tissue stranding in the right upper quadrant. Extrahepatic bile duct does not appear dilated. Pancreas: No peripancreatic inflammation. A cystic density at the pancreatic head measures 16 x 13 mm and appears grossly unchanged. Spleen: Normal in size without focal abnormality. Adrenals/Urinary Tract: Subcentimeter hypodense lesions in the kidneys, too small to further characterize. No hydronephrosis. The  bladder is enlarged. Stomach/Bowel: The stomach is nondilated. No dilated small bowel. Appendix normal. Focal wall thickening and inflammation of the hepatic flexure. Moderate stool throughout the colon. Mild wall thickening of the rectum Vascular/Lymphatic: Non aneurysmal aorta. Atherosclerotic calcifications. No grossly enlarged lymph nodes. Reproductive: Multiple seed implants in the prostate. Other: No free air or free fluid. Musculoskeletal: Vertebral augmentation at T11, T12, and L1 as before. Heterogenous sclerosis and lucency within the bilateral femoral heads does not appear significantly changed. IMPRESSION: 1. Multiple calcified gallstones. There is inflammatory process in the right upper quadrant of the abdomen as evidenced by soft tissue stranding, edema, and focal wall thickening of the hepatic flexure. Suspect that the changes are secondary to cholecystitis with  reactive wall thickening of adjacent hepatic flexure. 2. Stable 16 mm cystic lesion at the head of the pancreas 3. Mild rectal wall thickening suggestive of proctitis 4. Small right-sided pleural effusion with atelectasis or infiltrate at the right lower lobe. Electronically Signed   By: Donavan Foil M.D.   On: 09/17/2016 21:44   Ir Perc Cholecystostomy  Result Date: 09/22/2016 INDICATION: Acute cholecystitis and need for percutaneous cholecystostomy tube has the patient is not a candidate for operative cholecystectomy at this time. EXAM: CHOLECYSTOSTOMY CONTRAST:  10 mL Isovue-300 MEDICATIONS: No additional medications. ANESTHESIA/SEDATION: Moderate (conscious) sedation was employed during this procedure. A total of Versed 2.0 mg and Fentanyl 100 mcg was administered intravenously. Moderate Sedation Time: 30 minutes. The patient's level of consciousness and vital signs were monitored continuously by radiology nursing throughout the procedure under my direct supervision. FLUOROSCOPY TIME:  Fluoroscopy Time: 1 minutes.  3.6 mGy. COMPLICATIONS: None immediate. PROCEDURE: Informed written consent was obtained from the patient after a thorough discussion of the procedural risks, benefits and alternatives. All questions were addressed. Maximal Sterile Barrier Technique was utilized including caps, mask, sterile gowns, sterile gloves, sterile drape, hand hygiene and skin antiseptic. A timeout was performed prior to the initiation of the procedure. Ultrasound was used to localize the gallbladder. Under direct ultrasound guidance, a 21 gauge needle was advanced into the gallbladder lumen. After aspiration of bile, contrast injection was performed under fluoroscopy. A guidewire was then advanced into the gallbladder lumen and a transitional dilator place. The percutaneous tract was dilated over a guidewire and a 10 French drainage catheter advanced into the gallbladder lumen. A bile sample was aspirated and sent for  culture analysis. Catheter positioning was confirmed by fluoroscopy after injection with contrast material. The catheter was secured at the skin with a Prolene retention suture and StatLock device. The catheter was flushed and attached to a gravity drainage bag. FINDINGS: After access of the gallbladder, aspiration yielded purulent appearing bile. A sample was sent for culture. After placement of the cholecystostomy tube, there is return of thick, purulent appearing fluid. IMPRESSION: Percutaneous cholecystostomy tube placement for drainage of the gallbladder. There was return of purulent bile from the gallbladder lumen. A sample of this fluid was sent for culture analysis. The cholecystostomy tube was attached to gravity bag drainage. Electronically Signed   By: Aletta Edouard M.D.   On: 09/22/2016 16:33   Dg Chest Port 1 View  Result Date: 10/09/2016 CLINICAL DATA:  Healthcare associated pneumonia, history of diabetes, previous CVA, former smoker. EXAM: PORTABLE CHEST 1 VIEW COMPARISON:  Portable chest x-ray of October 06, 2016 FINDINGS: The lungs remain mildly hypoinflated but on the left the aeration has improved slightly. There are bibasilar densities consistent with atelectasis or pneumonia. There are bilateral pleural  effusions. The cardiac silhouette is mildly enlarged. The central pulmonary vascularity is prominent. The left-sided PICC line tip projects over the distal third of the SVC. IMPRESSION: Bibasilar atelectasis or pneumonia with pleural effusions, relatively stable. Stable enlargement of the cardiac silhouette with central pulmonary vascular prominence. Thoracic aortic atherosclerosis. Electronically Signed   By: David  Martinique M.D.   On: 10/09/2016 07:06   Dg Chest Port 1 View  Result Date: 10/06/2016 CLINICAL DATA:  Persistent cough and congestion. EXAM: PORTABLE CHEST 1 VIEW COMPARISON:  10/03/2016 . FINDINGS: Left PICC line noted with tip at cavoatrial junction. Cardiomegaly. Bilateral  pulmonary infiltrates and pleural effusions noted progressed from prior exam of 10/03/2016. CHF and bilateral pneumonia could present this fashion. Low lung volumes. Drainage catheter noted over the right upper quadrant in stable position. IMPRESSION: 1. Left PICC line noted with tip at cavoatrial junction. Drainage catheter right upper quadrant stable position. 2. Cardiomegaly with bibasilar pulmonary infiltrates/ edema and bilateral pleural effusions. Findings have progressed from prior exam of 10/03/2016. Low lung volumes . Electronically Signed   By: Marcello Moores  Register   On: 10/06/2016 17:19   Dg Chest Port 1 View  Result Date: 10/03/2016 CLINICAL DATA:  Acute respiratory failure EXAM: PORTABLE CHEST 1 VIEW COMPARISON:  10/02/2016 FINDINGS: Cardiac shadow is within normal limits. Left-sided PICC line is again seen and stable. Slight increase in the degree of left basilar atelectasis is noted with small effusion. The overall inspiratory effort is poor with crowding of the vascular markings. No acute bony abnormality is seen. IMPRESSION: Slight increase in the degree of left basilar atelectasis and effusion. The overall inspiratory effort is poor which may account for some these changes. Electronically Signed   By: Inez Catalina M.D.   On: 10/03/2016 07:21   Dg Chest Port 1 View  Result Date: 10/02/2016 CLINICAL DATA:  Post right thoracentesis EXAM: PORTABLE CHEST 1 VIEW COMPARISON:  10/02/2016 FINDINGS: Cardiomediastinal silhouette is stable. Stable endotracheal tube position. Left arm PICC line is unchanged in position. Improvement in aeration in right base with resolved right pleural effusion. Minimal residual right basilar atelectasis. Persistent small left pleural effusion with left basilar atelectasis or infiltrate. No pulmonary edema. There is no pneumothorax. Cholecystostomy catheter in right upper quadrant is stable in position. IMPRESSION: Stable endotracheal tube position. Left arm PICC line is  unchanged in position. Improvement in aeration in right base with resolved right pleural effusion. Minimal residual right basilar atelectasis. Persistent small left pleural effusion with left basilar atelectasis or infiltrate. No pulmonary edema. There is no pneumothorax. Electronically Signed   By: Lahoma Crocker M.D.   On: 10/02/2016 15:06   Dg Chest Port 1 View  Result Date: 10/02/2016 CLINICAL DATA:  Pneumonia EXAM: PORTABLE CHEST 1 VIEW COMPARISON:  10/01/2016 FINDINGS: Cardiac shadow is stable. Endotracheal tube and left-sided PICC line are noted and stable. Bilateral pleural effusions are noted right greater than left with associated consolidation. The overall appearance is stable. Cholecystostomy tube is noted in the right upper quadrant. No bony abnormality is noted. IMPRESSION: Stable appearance of the chest with bilateral effusions and bibasilar consolidation. Electronically Signed   By: Inez Catalina M.D.   On: 10/02/2016 07:32   Dg Chest Port 1 View  Result Date: 10/01/2016 CLINICAL DATA:  Confirm line placement. EXAM: PORTABLE CHEST 1 VIEW COMPARISON:  Chest radiograph October 01, 2016 FINDINGS: LEFT PICC distal tip projects in proximal RIGHT atrium. Endotracheal tube tip projects 3.4 cm above the carina. Nasogastric tube past proximal stomach, distal  tip not imaged. RIGHT upper quadrant pigtail drainage catheter. Cardiac silhouette is likely mildly enlarged, predominately obscured by bilateral pleural effusions and patchy airspace opacities. Mediastinal silhouette is nonsuspicious, calcified aortic knob. No pneumothorax. Old RIGHT rib fractures. IMPRESSION: LEFT PICC distal tip projects in RIGHT atrium, recommend 1-2 cm of retraction. No apparent change in remaining life support lines. Similar bilateral pleural effusions and patchy airspace opacities. Electronically Signed   By: Elon Alas M.D.   On: 10/01/2016 19:25   Dg Chest Port 1 View  Result Date: 10/01/2016 CLINICAL DATA:   Respiratory failure. EXAM: PORTABLE CHEST 1 VIEW COMPARISON:  09/29/2016. FINDINGS: Interim advancement of NG tube, its tip is below left hemidiaphragm. Endotracheal tube in stable position Heart size stable. Persistent bilateral pulmonary infiltrates and bilateral pleural effusions. Low lung volumes. No interim change . No pneumothorax. Prior vertebroplasties. IMPRESSION: 1. Interim advancement of NG tube, its tip is below left hemidiaphragm. Endotracheal tube in stable position. 2. Persistent bilateral pulmonary infiltrates and bilateral pleural effusions, right side greater than left. Low lung volumes. No interim change. Electronically Signed   By: Marcello Moores  Register   On: 10/01/2016 06:55   Dg Chest Portable 1 View  Result Date: 09/29/2016 CLINICAL DATA:  NG tube verification EXAM: PORTABLE CHEST 1 VIEW COMPARISON:  09/29/2016 FINDINGS: Endotracheal tube 2.7 cm above the carina. Nasogastric tube with the tip projecting over the stomach, but the proximal port is above the esophagogastric junction. Recommend advancing the tube 10 cm. Bilateral small pleural effusions. Bilateral interstitial thickening. Right mid and lower lung airspace disease. Mild left lower lobe airspace disease. No pneumothorax. No acute osseous abnormality. IMPRESSION: 1. Endotracheal tube 2.7 cm above the carina. 2. Nasogastric tube with the tip projecting over the stomach, but the proximal port is above the esophagogastric junction. Recommend advancing the tube 10 cm. 3. Findings concerning for pulmonary edema versus multi lobar pneumonia. Electronically Signed   By: Kathreen Devoid   On: 09/29/2016 17:42   Dg Chest Port 1 View  Result Date: 09/29/2016 CLINICAL DATA:  Intubation. EXAM: PORTABLE CHEST 1 VIEW COMPARISON:  05/23/2015 . FINDINGS: Endotracheal tube tip noted at the carina. Proximal repositioning of 3 cm should be considered. NG tube noted with tip at the level of the lower esophagus/ upper stomach. Distal positioning of  approximately 10 cm should be considered. Diffuse bilateral pulmonary infiltrates, right side greater than left. Bilateral pleural effusions. Cardiomegaly. Prior vertebroplasties. IMPRESSION: 1. Endotracheal tube tip noted at the carina. Proximal repositioning of approximately 3 cm should be considered. NG tube noted with tip at the level of the lower esophagus/upper stomach, distal position approximately 10 cm should be considered. 2. Diffuse bilateral pulmonary infiltrates right side greater than left. Bilateral pleural effusions. 3. Cardiomegaly. Critical Value/emergent results were called by telephone at the time of interpretation on 09/29/2016 at 5:01 pm to Dr. Merrily Pew , who verbally acknowledged these results. Electronically Signed   By: Marcello Moores  Register   On: 09/29/2016 17:05   Dg Abd Portable 1v  Result Date: 10/01/2016 CLINICAL DATA:  Orogastric tube placement EXAM: PORTABLE ABDOMEN - 1 VIEW COMPARISON:  Portable exam 1948 hours compared to 09/30/2016 FINDINGS: Tip of nasogastric tube projects over mid stomach. Bowel gas pattern normal. Bones demineralized with prior vertebroplasties at T11, T12 and L1. IMPRESSION: Tip of orogastric tube projects over mid stomach. Electronically Signed   By: Lavonia Dana M.D.   On: 10/01/2016 20:02   Dg Abd Portable 1v  Result Date: 09/30/2016 CLINICAL DATA:  83 year old male with orogastric tube placement. Subsequent encounter. EXAM: PORTABLE ABDOMEN - 1 VIEW COMPARISON:  09/30/2016. FINDINGS: Nasogastric tube has been advanced. Tip and side hole in the region of the left upper quadrant probably within the gastric fundus. Pigtail catheter right upper quadrant. Right femoral line tip just the right of the lower L5 vertebra. Cement augmentation T11, T12 and L1. Consolidation right lung. Nonspecific bowel gas pattern. IMPRESSION: Nasogastric tube tip and side hole left upper quadrant probably within the region of the gastric fundus. Electronically Signed   By:  Genia Del M.D.   On: 09/30/2016 06:52   Dg Abd Portable 1v  Result Date: 09/30/2016 CLINICAL DATA:  Orogastric tube repositioning.  Initial encounter. EXAM: PORTABLE ABDOMEN - 1 VIEW COMPARISON:  Chest radiograph performed 09/29/2016, and CT of the abdomen and pelvis from 09/17/2016 FINDINGS: The patient's enteric tube is noted ending overlying the distal esophagus, on correlation with prior CT. The visualized bowel gas pattern is grossly unremarkable. A cholecystostomy tube is noted. No free intra-abdominal air is identified, though evaluation for free air is limited on a single supine view. No acute osseous abnormalities are seen. The patient is status post vertebroplasty at multiple levels along the lower thoracic spine. IMPRESSION: Enteric tube noted ending overlying the distal esophagus, on correlation with prior CT. This could be advanced at least 8 cm, as deemed clinically appropriate. Electronically Signed   By: Garald Balding M.D.   On: 09/30/2016 01:14    Labs:  CBC:  Recent Labs  10/09/16 0500 10/10/16 0531 10/11/16 0543 10/12/16 0504  WBC 9.6 9.3 8.9 11.3*  HGB 10.5* 11.1* 11.0* 11.3*  HCT 33.1* 34.9* 34.6* 35.3*  PLT 323 308 280 251    COAGS:  Recent Labs  10/08/16 0603 10/09/16 0500 10/10/16 0531 10/11/16 0543  INR 1.79 2.00 1.88 1.69    BMP:  Recent Labs  10/07/16 0421 10/08/16 0603 10/11/16 0543 10/12/16 0504  NA 136 138 136 135  K 3.0* 2.9* 2.6* 3.1*  CL 98* 100* 95* 96*  CO2 31 30 31 31   GLUCOSE 131* 176* 139* 142*  BUN <5* <5* 5* 5*  CALCIUM 8.6* 8.8* 9.2 9.0  CREATININE 0.44* 0.51* 0.43* 0.47*  GFRNONAA >60 >60 >60 >60  GFRAA >60 >60 >60 >60    LIVER FUNCTION TESTS:  Recent Labs  09/29/16 1500 10/01/16 0444 10/02/16 0300 10/04/16 0514  BILITOT 1.9* 0.5 0.5 0.5  AST 16 19 21 20   ALT 18 14* 13* 15*  ALKPHOS 61 55 57 67  PROT 6.4* 5.2* 5.2* 5.6*  ALBUMIN 2.4* 1.7* 1.7* 1.7*    TUMOR MARKERS: No results for input(s): AFPTM,  CEA, CA199, CHROMGRNA in the last 8760 hours.  Assessment and Plan:  Dementia, dysphagia, no oral intake  Will proceed with placement of gastrostomy tube tomorrow.  No family currently available for consent  Will obtain consent tomorrow am.  Check INR.  Will call nurse in am and instruct time to stop heparin drip  Electronically Signed: Murrell Redden PA-C 10/12/2016, 8:34 AM   I spent a total of  25 Minutes in face to face in clinical consultation, greater than 50% of which was counseling/coordinating care for gastrostomy tube

## 2016-10-12 NOTE — Progress Notes (Signed)
ANTICOAGULATION CONSULT NOTE   Pharmacy Consult for Heparin Indication: atrial fibrillation and acute CVA  No Known Allergies  Patient Measurements: Height: 5\' 5"  (165.1 cm) Weight: 126 lb 6.4 oz (57.3 kg) IBW/kg (Calculated) : 61.5   Vital Signs: Temp: 97.6 F (36.4 C) (04/08 0607) BP: 159/78 (04/08 0607) Pulse Rate: 80 (04/08 0607)  Labs:  Recent Labs  10/10/16 0531 10/11/16 0543 10/11/16 1723 10/12/16 0504 10/12/16 1407  HGB 11.1* 11.0*  --  11.3*  --   HCT 34.9* 34.6*  --  35.3*  --   PLT 308 280  --  251  --   LABPROT 21.9* 20.1*  --   --   --   INR 1.88 1.69  --   --   --   HEPARINUNFRC  --   --  0.66 0.54 0.59  CREATININE  --  0.43*  --  0.47*  --     Estimated Creatinine Clearance: 52.7 mL/min (A) (by C-G formula based on SCr of 0.47 mg/dL (L)).   Assessment: 83 yo male admitted and found to have atrial fibrillation and acute CVA. Warfarin was started with a heparin bridge 10/04/16. Heparin with d/c'd with an INR of 2 on 10/09/16. INR dropped subtherapeutic on 4/6 AM, remains subtherapeutic today at 1.69.   Transitioned to IV heparin due to need to hold warfarin in anticipation of GTube placement. Heparin level has been elevated- most recently 0.59 units/mL - was supposed to be on 1000 units/hr (order entered this morning), but was never charted- confirmed with RN that infusion was running at 10 mL/hr (1000 units/hr).  CBC stable, no bleeding noted.  Goal of Therapy:  Heparin goal 0.3-0.5 Monitor platelets by anticoagulation protocol: Yes   Plan:  -decrease heparin to 900units/hr  -daily heparin levels, CBC -follow up plan for oral anticoagulation after GTube placement   Davarious Tumbleson D. Anuel Sitter, PharmD, BCPS Clinical Pharmacist Pager: [phone] 10/12/2016 3:39 PM

## 2016-10-13 ENCOUNTER — Inpatient Hospital Stay (HOSPITAL_COMMUNITY): Payer: Medicare Other

## 2016-10-13 ENCOUNTER — Encounter (HOSPITAL_COMMUNITY): Payer: Self-pay | Admitting: Interventional Radiology

## 2016-10-13 DIAGNOSIS — R64 Cachexia: Secondary | ICD-10-CM

## 2016-10-13 HISTORY — PX: IR GASTROSTOMY TUBE MOD SED: IMG625

## 2016-10-13 LAB — CBC
HEMATOCRIT: 34.9 % — AB (ref 39.0–52.0)
Hemoglobin: 11.1 g/dL — ABNORMAL LOW (ref 13.0–17.0)
MCH: 31.8 pg (ref 26.0–34.0)
MCHC: 31.8 g/dL (ref 30.0–36.0)
MCV: 100 fL (ref 78.0–100.0)
Platelets: 260 10*3/uL (ref 150–400)
RBC: 3.49 MIL/uL — ABNORMAL LOW (ref 4.22–5.81)
RDW: 14 % (ref 11.5–15.5)
WBC: 10.2 10*3/uL (ref 4.0–10.5)

## 2016-10-13 LAB — GLUCOSE, CAPILLARY
GLUCOSE-CAPILLARY: 135 mg/dL — AB (ref 65–99)
Glucose-Capillary: 121 mg/dL — ABNORMAL HIGH (ref 65–99)
Glucose-Capillary: 125 mg/dL — ABNORMAL HIGH (ref 65–99)
Glucose-Capillary: 136 mg/dL — ABNORMAL HIGH (ref 65–99)
Glucose-Capillary: 141 mg/dL — ABNORMAL HIGH (ref 65–99)
Glucose-Capillary: 190 mg/dL — ABNORMAL HIGH (ref 65–99)

## 2016-10-13 LAB — BASIC METABOLIC PANEL
Anion gap: 9 (ref 5–15)
CHLORIDE: 97 mmol/L — AB (ref 101–111)
CO2: 27 mmol/L (ref 22–32)
CREATININE: 0.51 mg/dL — AB (ref 0.61–1.24)
Calcium: 9 mg/dL (ref 8.9–10.3)
GFR calc Af Amer: >60 mL/min (ref >60)
GFR calc non Af Amer: >60 mL/min (ref >60)
GLUCOSE: 142 mg/dL — AB (ref 65–99)
POTASSIUM: 3.2 mmol/L — AB (ref 3.5–5.1)
Sodium: 133 mmol/L — ABNORMAL LOW (ref 135–145)

## 2016-10-13 LAB — HEPARIN LEVEL (UNFRACTIONATED): Heparin Unfractionated: 0.37 IU/mL (ref 0.30–0.70)

## 2016-10-13 LAB — PROTIME-INR
INR: 1.22
Prothrombin Time: 15.5 seconds — ABNORMAL HIGH (ref 11.4–15.2)

## 2016-10-13 MED ORDER — IOPAMIDOL (ISOVUE-300) INJECTION 61%
INTRAVENOUS | Status: AC
  Administered 2016-10-13: 20 mL
  Filled 2016-10-13: qty 50

## 2016-10-13 MED ORDER — MIDAZOLAM HCL 2 MG/2ML IJ SOLN
INTRAMUSCULAR | Status: AC | PRN
  Administered 2016-10-13 (×4): 0.5 mg via INTRAVENOUS

## 2016-10-13 MED ORDER — GLUCAGON HCL RDNA (DIAGNOSTIC) 1 MG IJ SOLR
INTRAMUSCULAR | Status: AC
  Filled 2016-10-13: qty 1

## 2016-10-13 MED ORDER — HEPARIN (PORCINE) IN NACL 100-0.45 UNIT/ML-% IJ SOLN
850.0000 [IU]/h | INTRAMUSCULAR | Status: DC
  Administered 2016-10-13: 900 [IU]/h via INTRAVENOUS
  Filled 2016-10-13: qty 250

## 2016-10-13 MED ORDER — FENTANYL CITRATE (PF) 100 MCG/2ML IJ SOLN
INTRAMUSCULAR | Status: AC
  Filled 2016-10-13: qty 2

## 2016-10-13 MED ORDER — MIDAZOLAM HCL 2 MG/2ML IJ SOLN
INTRAMUSCULAR | Status: AC
  Filled 2016-10-13: qty 2

## 2016-10-13 MED ORDER — GLUCAGON HCL RDNA (DIAGNOSTIC) 1 MG IJ SOLR
INTRAMUSCULAR | Status: AC | PRN
  Administered 2016-10-13: 1 mg via INTRAVENOUS

## 2016-10-13 MED ORDER — JEVITY 1.2 CAL PO LIQD
1000.0000 mL | ORAL | Status: DC
  Administered 2016-10-14: 1000 mL
  Filled 2016-10-13 (×2): qty 1000

## 2016-10-13 MED ORDER — POTASSIUM CHLORIDE CRYS ER 20 MEQ PO TBCR
40.0000 meq | EXTENDED_RELEASE_TABLET | Freq: Once | ORAL | Status: AC
  Administered 2016-10-13: 40 meq via ORAL
  Filled 2016-10-13: qty 2

## 2016-10-13 MED ORDER — FENTANYL CITRATE (PF) 100 MCG/2ML IJ SOLN
INTRAMUSCULAR | Status: AC | PRN
  Administered 2016-10-13: 12.5 ug via INTRAVENOUS
  Administered 2016-10-13: 25 ug via INTRAVENOUS
  Administered 2016-10-13 (×3): 12.5 ug via INTRAVENOUS

## 2016-10-13 MED ORDER — CEFAZOLIN SODIUM-DEXTROSE 2-4 GM/100ML-% IV SOLN
INTRAVENOUS | Status: AC
  Administered 2016-10-13: 10:00:00
  Filled 2016-10-13: qty 100

## 2016-10-13 MED ORDER — LIDOCAINE HCL 1 % IJ SOLN
INTRAMUSCULAR | Status: DC | PRN
  Administered 2016-10-13: 6 mL

## 2016-10-13 MED ORDER — LIDOCAINE HCL (PF) 1 % IJ SOLN
INTRAMUSCULAR | Status: AC
  Filled 2016-10-13: qty 30

## 2016-10-13 NOTE — Sedation Documentation (Signed)
Patient is resting comfortably. 

## 2016-10-13 NOTE — Care Management Important Message (Signed)
Important Message  Patient Details  Name: Stephen Bates MRN: [id number] Date of Birth: Nov 08, 1926   Medicare Important Message Given:  Yes    Nathen May 10/13/2016, 4:48 PM

## 2016-10-13 NOTE — Progress Notes (Signed)
ANTICOAGULATION CONSULT NOTE - Follow Up Consult  Pharmacy Consult for heparin Indication: atrial fibrillation in setting of acute CVA  Labs:  Recent Labs  10/11/16 0543  10/12/16 0504 10/12/16 1407 10/13/16 0535  HGB 11.0*  --  11.3*  --  11.1*  HCT 34.6*  --  35.3*  --  34.9*  PLT 280  --  251  --  260  LABPROT 20.1*  --   --   --  15.5*  INR 1.69  --   --   --  1.22  HEPARINUNFRC  --   < > 0.54 0.59 0.37  CREATININE 0.43*  --  0.47*  --  0.51*  < > = values in this interval not displayed.   Assessment/Plan:  83yo male therapeutic on heparin after rate changes. Drip now off in anticipation of G tube placement; will f/u when to resume.   Wynona Neat, PharmD, BCPS  10/13/2016,6:55 AM

## 2016-10-13 NOTE — Sedation Documentation (Signed)
GTube successfully placed

## 2016-10-13 NOTE — Progress Notes (Signed)
Radiology called instructing to stop the heparin drip for preparation for the placement of the G tube

## 2016-10-13 NOTE — Progress Notes (Signed)
ANTICOAGULATION CONSULT NOTE   Pharmacy Consult for Heparin Indication: atrial fibrillation and acute CVA  No Known Allergies  Patient Measurements: Height: 5\' 5"  (165.1 cm) Weight: 126 lb 6.4 oz (57.3 kg) IBW/kg (Calculated) : 61.5   Vital Signs: Temp: 97.8 F (36.6 C) (04/09 1107) Temp Source: Oral (04/09 1107) BP: 152/66 (04/09 1107) Pulse Rate: 87 (04/09 1107)  Labs:  Recent Labs  10/11/16 0543  10/12/16 0504 10/12/16 1407 10/13/16 0535  HGB 11.0*  --  11.3*  --  11.1*  HCT 34.6*  --  35.3*  --  34.9*  PLT 280  --  251  --  260  LABPROT 20.1*  --   --   --  15.5*  INR 1.69  --   --   --  1.22  HEPARINUNFRC  --   < > 0.54 0.59 0.37  CREATININE 0.43*  --  0.47*  --  0.51*  < > = values in this interval not displayed.  Estimated Creatinine Clearance: 52.7 mL/min (A) (by C-G formula based on SCr of 0.51 mg/dL (L)).   Assessment: 83 yo male admitted and found to have atrial fibrillation and acute CVA. Warfarin was started with a heparin bridge 10/04/16. Heparin with d/c'd with an INR of 2 on 10/09/16. INR dropped subtherapeutic on 4/6 AM, remains subtherapeutic today at 1.69.   Transitioned to IV heparin due to need to hold warfarin in anticipation of GTube placement. Heparin level has been elevated- most recently therapeutic at 0.37.  *Heparin held this morning for G-tube placement. Plan for family discussion for goals of care with palliative.  CBC stable, no bleeding noted.  Goal of Therapy:  Heparin goal 0.3-0.5 Monitor platelets by anticoagulation protocol: Yes   Plan:  - Restart heparin infusion at 1700 tonight (6 hours post procedure) @ rate of 900units/hr - Daily heparin levels, CBC - Follow up plan for oral anticoagulation after GTube placement/GOC discussion - Considering DOAC vs warfarin vs NO anticoagulation??   Thank you for allowing [place] to participate in this patients care.  Jens Som, PharmD Clinical phone for 10/13/2016 from 7a-3:30p: [phone]  [phone] If after 3:30p, please call main pharmacy at: [phone] 10/13/2016 12:57 PM

## 2016-10-13 NOTE — Progress Notes (Signed)
Heparin stopped at this time, per pharmacy

## 2016-10-13 NOTE — Progress Notes (Signed)
PROGRESS NOTE    Stephen Bates  FGH:[id number] DOB: 31-Aug-1926 DOA: 09/29/2016 PCP: Asencion Noble, MD   Brief Narrative: Stephen Bates is a 83 y.o. male with hx of DM, dementia, prostate CA, and CVA who is bedbound. He presented after being found unresponsive requiring intubation and found to have another acute CVA. Goals of care discussions. G tube placed.   Assessment & Plan:   Principal Problem:   Severe sepsis with septic shock (HCC) Active Problems:   Mild dementia   Vascular parkinsonism (HCC)   Malnutrition of moderate degree   History of right bundle branch block   Diabetes ([place])   Stroke ([place]) history   HCAP (healthcare-associated pneumonia)   Septic shock (HCC)   Acute respiratory failure with hypoxia (HCC)   Encephalopathy   Pleural effusion   Acute CVA (cerebrovascular accident) [facility])   Palliative care encounter   Goals of care, counseling/discussion   Decreased appetite   Septic shock with acute hypoxic respiratory failure secondary to HCAP Resolved. Weaned off of oxygen.  History of percutaneous drain s/p acute cholecystitis Culture significant for coagulase negative staph. Has had 10 days of vancomycin. -outpatient general surgery follow-up  Paroxysmal atrial fibrillation New -continue heparin -hold coumadin for g-tube placement  Acute CVA Expressive aphasia. Stroke team signed off. -hold coumadin in setting of g-tube placement -G-tube placed 4/9  Dysphagia Secondary to stroke. -dysphagia 1 diet -G-tube as above, to start using on 4/10 -dietitian consult for tube feeds  Chronic anemia Hemoglobin stable.  Diabetes mellitus -continue SSI  Acute encephalopathy Dementia Acute CVA likely contributing. No oral intake. -continue Remeron  Pleural effusions Given lasix with good output -continue lasix PO  Hypokalemia -Replete  Goals of care Palliative care in discussions with family. G-tube and home hospice  DVT prophylaxis: Heparin  drip Code Status: DNR/DNI Family Communication: None at bedside Disposition Plan: Discharge pending g tube placement with plan for home with hospice   Consultants:   Palliative care medicine  Neurology  PCCM  Procedures:   Percutaneous gastrostomy tube placement (4/9)  Antimicrobials:  Vancomycin  Ceftazidime   Subjective: No concerns overnight  Objective: Vitals:   10/13/16 1021 10/13/16 1026 10/13/16 1030 10/13/16 1107  BP: (!) 159/95 (!) 171/77 (!) 175/90 (!) 152/66  Pulse: (!) 116 97 (!) 109 87  Resp: (!) 24 (!) 27 (!) 22 20  Temp:    97.8 F (36.6 C)  TempSrc:    Oral  SpO2: 95% 96% 93% 95%  Weight:      Height:        Intake/Output Summary (Last 24 hours) at 10/13/16 1113 Last data filed at 10/13/16 0700  Gross per 24 hour  Intake           225.82 ml  Output              150 ml  Net            75.82 ml   Filed Weights   10/10/16 0239 10/11/16 0500 10/12/16 0748  Weight: 60.1 kg (132 lb 7.9 oz) 56.9 kg (125 lb 7.1 oz) 57.3 kg (126 lb 6.4 oz)    Examination:  General exam: Appears calm and comfortable   Data Reviewed: I have personally reviewed following labs and imaging studies  CBC:  Recent Labs Lab 10/09/16 0500 10/10/16 0531 10/11/16 0543 10/12/16 0504 10/13/16 0535  WBC 9.6 9.3 8.9 11.3* 10.2  HGB 10.5* 11.1* 11.0* 11.3* 11.1*  HCT 33.1* 34.9* 34.6* 35.3* 34.9*  MCV 100.3* 99.1 100.0 99.7 100.0  PLT 323 308 280 251 810   Basic Metabolic Panel:  Recent Labs Lab 10/07/16 0421 10/07/16 0742 10/08/16 0603 10/11/16 0543 10/12/16 0504 10/13/16 0535  NA 136  --  138 136 135 133*  K 3.0*  --  2.9* 2.6* 3.1* 3.2*  CL 98*  --  100* 95* 96* 97*  CO2 31  --  30 31 31 27   GLUCOSE 131*  --  176* 139* 142* 142*  BUN <5*  --  <5* 5* 5* <5*  CREATININE 0.44*  --  0.51* 0.43* 0.47* 0.51*  CALCIUM 8.6*  --  8.8* 9.2 9.0 9.0  MG  --  2.1  --   --  1.8  --    GFR: Estimated Creatinine Clearance: 52.7 mL/min (A) (by C-G formula based  on SCr of 0.51 mg/dL (L)). Liver Function Tests: No results for input(s): AST, ALT, ALKPHOS, BILITOT, PROT, ALBUMIN in the last 168 hours. No results for input(s): LIPASE, AMYLASE in the last 168 hours. No results for input(s): AMMONIA in the last 168 hours. Coagulation Profile:  Recent Labs Lab 10/08/16 0603 10/09/16 0500 10/10/16 0531 10/11/16 0543 10/13/16 0535  INR 1.79 2.00 1.88 1.69 1.22   Cardiac Enzymes: No results for input(s): CKTOTAL, CKMB, CKMBINDEX, TROPONINI in the last 168 hours. BNP (last 3 results) No results for input(s): PROBNP in the last 8760 hours. HbA1C: No results for input(s): HGBA1C in the last 72 hours. CBG:  Recent Labs Lab 10/12/16 2109 10/12/16 2133 10/13/16 0044 10/13/16 0437 10/13/16 0805  GLUCAP 175* 166* 136* 121* 125*   Lipid Profile: No results for input(s): CHOL, HDL, LDLCALC, TRIG, CHOLHDL, LDLDIRECT in the last 72 hours. Thyroid Function Tests: No results for input(s): TSH, T4TOTAL, FREET4, T3FREE, THYROIDAB in the last 72 hours. Anemia Panel: No results for input(s): VITAMINB12, FOLATE, FERRITIN, TIBC, IRON, RETICCTPCT in the last 72 hours. Sepsis Labs: No results for input(s): PROCALCITON, LATICACIDVEN in the last 168 hours.  No results found for this or any previous visit (from the past 240 hour(s)).       Radiology Studies: Ir Gastrostomy Tube Mod Sed  Result Date: 10/13/2016 INDICATION: 83 year old male with advanced dementia and protein calorie malnutrition. Percutaneous gastrostomy tube is warranted for enteral nutrition. EXAM: Fluoroscopically guided placement of percutaneous pull-through gastrostomy tube Interventional Radiologist:  Stephen Peaches, MD MEDICATIONS: 2 g Ancef; Antibiotics were administered within 1 hour of the procedure. ANESTHESIA/SEDATION: Versed 2mg  IV; Fentanyl 75 mcg IV Moderate Sedation Time:  50 minutes The patient was continuously monitored during the procedure by the interventional radiology  nurse under my direct supervision. CONTRAST:  29mL ISOVUE-300 IOPAMIDOL (ISOVUE-300) INJECTION 61% FLUOROSCOPY TIME:  Fluoroscopy Time: 11 minutes 54 seconds (43 mGy). COMPLICATIONS: None immediate. PROCEDURE: Informed written consent was obtained from the patient after a thorough discussion of the procedural risks, benefits and alternatives. All questions were addressed. Maximal Sterile Barrier Technique was utilized including caps, mask, sterile gowns, sterile gloves, sterile drape, hand hygiene and skin antiseptic. A timeout was performed prior to the initiation of the procedure. Maximal barrier sterile technique utilized including caps, mask, sterile gowns, sterile gloves, large sterile drape, hand hygiene, and chlorhexadine skin prep. An angled catheter was advanced over a wire under fluoroscopic guidance through the nose, down the esophagus and into the body of the stomach. The stomach was then insufflated with several 100 ml of air. Fluoroscopy confirmed location of the gastric bubble, as well as inferior displacement of the  barium stained colon. Under direct fluoroscopic guidance, a single T-tack was placed, and the anterior gastric wall drawn up against the anterior abdominal wall. Percutaneous access was then obtained into the mid gastric body with an 18 gauge sheath needle. Aspiration of air, and injection of contrast material under fluoroscopy confirmed needle placement. An Amplatz wire was advanced in the gastric body and the access needle exchanged for a 9-French vascular sheath. A snare device was advanced through the vascular sheath and an Amplatz wire advanced through the angled catheter. The Amplatz wire was successfully snared and this was pulled up through the esophagus and out the mouth. A 20-French Alinda Dooms MIC-PEG tube was then connected to the snare and pulled through the mouth, down the esophagus, into the stomach and out to the anterior abdominal wall. Hand injection of contrast  material confirmed intragastric location. The T-tack retention suture was then cut. The pull through peg tube was then secured with the external bumper and capped. The patient will be observed for several hours with the newly placed tube on low wall suction to evaluate for any post procedure complication. The patient tolerated the procedure well, there is no immediate complication. IMPRESSION: Successful placement of a 20 French pull through gastrostomy tube. Electronically Signed   By: Jacqulynn Cadet M.D.   On: 10/13/2016 10:37        Scheduled Meds: . benazepril  40 mg Oral q morning - 10a  . Chlorhexidine Gluconate Cloth  6 each Topical Daily  . docusate  100 mg Oral BID  . feeding supplement (ENSURE ENLIVE)  237 mL Oral BID BM  . fentaNYL      . glucagon (human recombinant)      . insulin aspart  0-9 Units Subcutaneous Q4H  . lidocaine (PF)      . mouth rinse  15 mL Mouth Rinse BID  . midazolam      . mirtazapine  30 mg Oral QHS  . senna  2 tablet Oral Daily  . sodium chloride flush  10-40 mL Intracatheter Q12H   Continuous Infusions: . dextrose 20 mL/hr at 10/12/16 1552  . heparin 900 Units/hr (10/12/16 1547)     LOS: 14 days     Cordelia Poche, MD [facility] 10/13/2016, 11:13 AM Pager: [phone]  If 7PM-7AM, please contact night-coverage [email or web address] Password [email or web address] 10/13/2016, 11:13 AM

## 2016-10-13 NOTE — Progress Notes (Signed)
Nutrition Follow-up  DOCUMENTATION CODES:   Non-severe (moderate) malnutrition in context of chronic illness  INTERVENTION:   -Initiate Jevity 1.2@ 22ml/hr via PEGand increase by 10 ml every 12hours to goal rate of 45ml/hr. Will schedule feeding to begin on 10/14/16 at 1000.   If no IVF, recommend 110 ml free water flush 4 times daily (440 ml free water).   Tube feeding regimen provides 1584kcal (100% of needs), 73grams of protein, and 1041ml of H2O.   -Pt is at high refeeding risk. Recommend check Mg, K, and Phos daily x 3 days and monitor for signs of refeeding syndrome.   NUTRITION DIAGNOSIS:   Malnutrition (Moderate) related to  (dementia, bed bound, poor dention/difficulty chewing) as evidenced by energy intake < or equal to 75% for > or equal to 1 month, mild depletion of body fat, moderate depletions of muscle mass.  Ongoing  GOAL:   Patient will meet greater than or equal to 90% of their needs  Progressing  MONITOR:   PO intake, Supplement acceptance, Labs, Weight trends, TF tolerance, Skin, I & O's  REASON FOR ASSESSMENT:   Consult Enteral/tube feeding initiation and management  ASSESSMENT:   83 year old male with PMH of DM, bedbound, dementia, prostate Ca, and CVA. He presented to [facility] ED after being found unresponsive at home. He remained unresponsive in ED and was intubated for airway protection. CXR concerning for multifocal PNA. He was transferred to [facility] for ICU care.   Pt underwent g-tube placement this morning by IR.  RD received consult for TF initiation and management. Case discussed with RN and Dr. Lonny Prude. Pt continues to have minimal PO intake, however, RN reports pt is a little more alert and taking more PO's today. Plan for medications by mouth today. Dr. Lonny Prude confirmed that feedings will start tomorrow.   Given that pt is at refeeding risk (abnormal electrolytes, minimal PO intake x 1 week, and malnutrition), plan to advance  feedings slowly and monitor electrolytes.   Palliative care team following; plan to d/c home with hospice.   Labs reviewed: Na: 132, K: 3.2, CBGS: 121-190.   Diet Order:  Diet NPO time specified Except for: Ice Chips  Skin:  Wound (see comment) (stage I and II to sacrum)  Last BM:  10/10/16  Height:   Ht Readings from Last 1 Encounters:  10/03/16 5\' 5"  (1.651 m)    Weight:   Wt Readings from Last 1 Encounters:  10/12/16 126 lb 6.4 oz (57.3 kg)    Ideal Body Weight:  59.1 kg  BMI:  Body mass index is 21.03 kg/m.  Estimated Nutritional Needs:   Kcal:  1400-1600  Protein:  70-85 grams  Fluid:  1.4-1.6 L  EDUCATION NEEDS:   No education needs identified at this time  Arynn Armand A. Jimmye Norman, RD, LDN, CDE Pager: [phone] After hours Pager: [phone]

## 2016-10-13 NOTE — Progress Notes (Signed)
Regarding home hospice, choice selected by wife/ daughter  for referral : [facility], [phone]. CM faxed referral to [facility] @ [phone] for home hospice.  [place] [place], [place] [phone]

## 2016-10-13 NOTE — Procedures (Signed)
Interventional Radiology Procedure Note  Procedure: Placement of percutaneous 65F pull-through gastrostomy tube. Complications: None Recommendations: - NPO except for sips and chips remainder of today and overnight - Maintain G-tube to LWS x 4 hrs  - May advance diet as tolerated and begin using tube tomorrow morning  Signed,  Criselda Peaches, MD

## 2016-10-13 NOTE — Progress Notes (Signed)
Unable to obtain patient weight this morning due to complications with his bed.  Patient is non-ambulatory.  Notified the on coming nurse.

## 2016-10-14 LAB — GLUCOSE, CAPILLARY
GLUCOSE-CAPILLARY: 137 mg/dL — AB (ref 65–99)
GLUCOSE-CAPILLARY: 155 mg/dL — AB (ref 65–99)
Glucose-Capillary: 122 mg/dL — ABNORMAL HIGH (ref 65–99)
Glucose-Capillary: 134 mg/dL — ABNORMAL HIGH (ref 65–99)
Glucose-Capillary: 155 mg/dL — ABNORMAL HIGH (ref 65–99)
Glucose-Capillary: 157 mg/dL — ABNORMAL HIGH (ref 65–99)
Glucose-Capillary: 196 mg/dL — ABNORMAL HIGH (ref 65–99)

## 2016-10-14 LAB — BASIC METABOLIC PANEL
ANION GAP: 8 (ref 5–15)
BUN: <5 mg/dL — ABNORMAL LOW (ref 6–20)
CALCIUM: 9.1 mg/dL (ref 8.9–10.3)
CO2: 28 mmol/L (ref 22–32)
Chloride: 98 mmol/L — ABNORMAL LOW (ref 101–111)
Creatinine, Ser: 0.54 mg/dL — ABNORMAL LOW (ref 0.61–1.24)
Glucose, Bld: 148 mg/dL — ABNORMAL HIGH (ref 65–99)
Potassium: 3.5 mmol/L (ref 3.5–5.1)
SODIUM: 134 mmol/L — AB (ref 135–145)

## 2016-10-14 LAB — CBC
HEMATOCRIT: 35 % — AB (ref 39.0–52.0)
HEMOGLOBIN: 11 g/dL — AB (ref 13.0–17.0)
MCH: 31.6 pg (ref 26.0–34.0)
MCHC: 31.4 g/dL (ref 30.0–36.0)
MCV: 100.6 fL — AB (ref 78.0–100.0)
Platelets: 248 10*3/uL (ref 150–400)
RBC: 3.48 MIL/uL — AB (ref 4.22–5.81)
RDW: 14.2 % (ref 11.5–15.5)
WBC: 9.9 10*3/uL (ref 4.0–10.5)

## 2016-10-14 LAB — PHOSPHORUS: PHOSPHORUS: 2.6 mg/dL (ref 2.5–4.6)

## 2016-10-14 LAB — HEPARIN LEVEL (UNFRACTIONATED): Heparin Unfractionated: 0.52 IU/mL (ref 0.30–0.70)

## 2016-10-14 LAB — MAGNESIUM: MAGNESIUM: 1.7 mg/dL (ref 1.7–2.4)

## 2016-10-14 MED ORDER — WARFARIN SODIUM 5 MG PO TABS: 5.0000 mg | ORAL_TABLET | Freq: Once | ORAL | Status: DC

## 2016-10-14 MED ORDER — ACETAMINOPHEN 160 MG/5ML PO SOLN: 650.0000 mg | Freq: Four times a day (QID) | ORAL | Status: DC | PRN

## 2016-10-14 MED ORDER — LORAZEPAM 2 MG/ML PO CONC: 1.0000 mg | ORAL | Status: DC | PRN

## 2016-10-14 MED ORDER — WARFARIN - PHARMACIST DOSING INPATIENT: Freq: Every day | Status: DC

## 2016-10-14 MED ORDER — DOCUSATE SODIUM 50 MG/5ML PO LIQD
100.0000 mg | Freq: Two times a day (BID) | ORAL | Status: DC
  Administered 2016-10-14: 100 mg
  Filled 2016-10-14: qty 10

## 2016-10-14 MED ORDER — HALOPERIDOL LACTATE 2 MG/ML PO CONC
2.0000 mg | Freq: Three times a day (TID) | ORAL | Status: DC | PRN
  Filled 2016-10-14: qty 1

## 2016-10-14 MED ORDER — ONDANSETRON HCL 4 MG/5ML PO SOLN
4.0000 mg | Freq: Three times a day (TID) | ORAL | Status: DC | PRN
  Filled 2016-10-14: qty 5

## 2016-10-14 MED ORDER — MIRTAZAPINE 30 MG PO TABS
30.0000 mg | ORAL_TABLET | Freq: Every day | ORAL | Status: DC
  Administered 2016-10-14: 30 mg
  Filled 2016-10-14: qty 1

## 2016-10-14 MED ORDER — BISACODYL 10 MG RE SUPP: 10.0000 mg | Freq: Every day | RECTAL | Status: DC | PRN

## 2016-10-14 MED ORDER — MORPHINE SULFATE (CONCENTRATE) 10 MG/0.5ML PO SOLN: 5.0000 mg | ORAL | Status: DC | PRN

## 2016-10-14 NOTE — Progress Notes (Signed)
PROGRESS NOTE    JR MILLIRON  DEY:[id number] DOB: Jun 02, 1927 DOA: 09/29/2016 PCP: Asencion Noble, MD   Brief Narrative: Stephen Bates is a 83 y.o. male with hx of DM, dementia, prostate CA, and CVA who is bedbound. He presented after being found unresponsive requiring intubation and found to have another acute CVA. Goals of care discussions. G tube placed.   Assessment & Plan:   Principal Problem:   Severe sepsis with septic shock (HCC) Active Problems:   Mild dementia   Vascular parkinsonism (HCC)   Malnutrition of moderate degree   History of right bundle branch block   Diabetes ([place])   Stroke ([place]) history   HCAP (healthcare-associated pneumonia)   Septic shock (HCC)   Acute respiratory failure with hypoxia (HCC)   Encephalopathy   Pleural effusion   Acute CVA (cerebrovascular accident) [facility])   Palliative care encounter   Goals of care, counseling/discussion   Decreased appetite   Septic shock with acute hypoxic respiratory failure secondary to HCAP Resolved. Weaned off of oxygen.  History of percutaneous drain s/p acute cholecystitis Culture significant for coagulase negative staph. Has had 10 days of vancomycin.  Paroxysmal atrial fibrillation New. [facility] care. No anticoagulation  Acute CVA Expressive aphasia. Stroke team signed off.  Dysphagia Secondary to stroke. G-tube placed on 10/13/2016, however, today, patient's family made him comfort care with plans for discharge to residential hospice.  Chronic anemia Hemoglobin stable.  Diabetes mellitus Residential hospice.  Acute encephalopathy Dementia Acute CVA likely contributing. No oral intake. -continue Remeron  Pleural effusions Given lasix with good output -continue lasix PO  Hypokalemia -Replete  Goals of care Palliative care in discussions with family. Residential hospice. Do not use G-tube  DVT prophylaxis: Comfort care Code Status: DNR/DNI Family Communication: None at  bedside Disposition Plan: Discharge to residential hospice when bed available   Consultants:   Palliative care medicine  Neurology  PCCM  Procedures:   Percutaneous gastrostomy tube placement (4/9)  Antimicrobials:  Vancomycin  Ceftazidime   Subjective: No pain  Objective: Vitals:   10/13/16 1026 10/13/16 1030 10/13/16 1107 10/14/16 0528  BP: (!) 171/77 (!) 175/90 (!) 152/66 (!) 140/59  Pulse: 97 (!) 109 87 81  Resp: (!) 27 (!) 22 20 (!) 22  Temp:   97.8 F (36.6 C) 97.8 F (36.6 C)  TempSrc:   Oral   SpO2: 96% 93% 95% 98%  Weight:    56.6 kg (124 lb 12.5 oz)  Height:        Intake/Output Summary (Last 24 hours) at 10/14/16 0928 Last data filed at 10/14/16 0800  Gross per 24 hour  Intake           515.67 ml  Output              700 ml  Net          -184.33 ml   Filed Weights   10/11/16 0500 10/12/16 0748 10/14/16 0528  Weight: 56.9 kg (125 lb 7.1 oz) 57.3 kg (126 lb 6.4 oz) 56.6 kg (124 lb 12.5 oz)    Examination:  General exam: Appears calm and comfortable Respiratory system: Diffuse rhonchi and slightly diminished bilaterally. Respiratory effort normal. Cardiovascular system: S1 & S2 heard, RRR. No murmurs Gastrointestinal system: Abdomen is nondistended, soft and nontender. Normal bowel sounds heard. G tube dressing intact and clean. No tenderness around the area. Central nervous system: Alert, not oriented Extremities: No edema. No calf tenderness   Data Reviewed: I have personally reviewed  following labs and imaging studies  CBC:  Recent Labs Lab 10/09/16 0500 10/10/16 0531 10/11/16 0543 10/12/16 0504 10/13/16 0535  WBC 9.6 9.3 8.9 11.3* 10.2  HGB 10.5* 11.1* 11.0* 11.3* 11.1*  HCT 33.1* 34.9* 34.6* 35.3* 34.9*  MCV 100.3* 99.1 100.0 99.7 100.0  PLT 323 308 280 251 903   Basic Metabolic Panel:  Recent Labs Lab 10/08/16 0603 10/11/16 0543 10/12/16 0504 10/13/16 0535  NA 138 136 135 133*  K 2.9* 2.6* 3.1* 3.2*  CL 100* 95*  96* 97*  CO2 30 31 31 27   GLUCOSE 176* 139* 142* 142*  BUN <5* 5* 5* <5*  CREATININE 0.51* 0.43* 0.47* 0.51*  CALCIUM 8.8* 9.2 9.0 9.0  MG  --   --  1.8  --    GFR: Estimated Creatinine Clearance: 52.1 mL/min (A) (by C-G formula based on SCr of 0.51 mg/dL (L)). Liver Function Tests: No results for input(s): AST, ALT, ALKPHOS, BILITOT, PROT, ALBUMIN in the last 168 hours. No results for input(s): LIPASE, AMYLASE in the last 168 hours. No results for input(s): AMMONIA in the last 168 hours. Coagulation Profile:  Recent Labs Lab 10/08/16 0603 10/09/16 0500 10/10/16 0531 10/11/16 0543 10/13/16 0535  INR 1.79 2.00 1.88 1.69 1.22   Cardiac Enzymes: No results for input(s): CKTOTAL, CKMB, CKMBINDEX, TROPONINI in the last 168 hours. BNP (last 3 results) No results for input(s): PROBNP in the last 8760 hours. HbA1C: No results for input(s): HGBA1C in the last 72 hours. CBG:  Recent Labs Lab 10/13/16 1659 10/13/16 2009 10/14/16 0017 10/14/16 0406 10/14/16 0801  GLUCAP 135* 141* 134* 122* 137*   Lipid Profile: No results for input(s): CHOL, HDL, LDLCALC, TRIG, CHOLHDL, LDLDIRECT in the last 72 hours. Thyroid Function Tests: No results for input(s): TSH, T4TOTAL, FREET4, T3FREE, THYROIDAB in the last 72 hours. Anemia Panel: No results for input(s): VITAMINB12, FOLATE, FERRITIN, TIBC, IRON, RETICCTPCT in the last 72 hours. Sepsis Labs: No results for input(s): PROCALCITON, LATICACIDVEN in the last 168 hours.  No results found for this or any previous visit (from the past 240 hour(s)).       Radiology Studies: Ir Gastrostomy Tube Mod Sed  Result Date: 10/13/2016 INDICATION: 83 year old male with advanced dementia and protein calorie malnutrition. Percutaneous gastrostomy tube is warranted for enteral nutrition. EXAM: Fluoroscopically guided placement of percutaneous pull-through gastrostomy tube Interventional Radiologist:  Criselda Peaches, MD MEDICATIONS: 2 g Ancef;  Antibiotics were administered within 1 hour of the procedure. ANESTHESIA/SEDATION: Versed 2mg  IV; Fentanyl 75 mcg IV Moderate Sedation Time:  50 minutes The patient was continuously monitored during the procedure by the interventional radiology nurse under my direct supervision. CONTRAST:  42mL ISOVUE-300 IOPAMIDOL (ISOVUE-300) INJECTION 61% FLUOROSCOPY TIME:  Fluoroscopy Time: 11 minutes 54 seconds (43 mGy). COMPLICATIONS: None immediate. PROCEDURE: Informed written consent was obtained from the patient after a thorough discussion of the procedural risks, benefits and alternatives. All questions were addressed. Maximal Sterile Barrier Technique was utilized including caps, mask, sterile gowns, sterile gloves, sterile drape, hand hygiene and skin antiseptic. A timeout was performed prior to the initiation of the procedure. Maximal barrier sterile technique utilized including caps, mask, sterile gowns, sterile gloves, large sterile drape, hand hygiene, and chlorhexadine skin prep. An angled catheter was advanced over a wire under fluoroscopic guidance through the nose, down the esophagus and into the body of the stomach. The stomach was then insufflated with several 100 ml of air. Fluoroscopy confirmed location of the gastric bubble, as well as inferior  displacement of the barium stained colon. Under direct fluoroscopic guidance, a single T-tack was placed, and the anterior gastric wall drawn up against the anterior abdominal wall. Percutaneous access was then obtained into the mid gastric body with an 18 gauge sheath needle. Aspiration of air, and injection of contrast material under fluoroscopy confirmed needle placement. An Amplatz wire was advanced in the gastric body and the access needle exchanged for a 9-French vascular sheath. A snare device was advanced through the vascular sheath and an Amplatz wire advanced through the angled catheter. The Amplatz wire was successfully snared and this was pulled up through  the esophagus and out the mouth. A 20-French Alinda Dooms MIC-PEG tube was then connected to the snare and pulled through the mouth, down the esophagus, into the stomach and out to the anterior abdominal wall. Hand injection of contrast material confirmed intragastric location. The T-tack retention suture was then cut. The pull through peg tube was then secured with the external bumper and capped. The patient will be observed for several hours with the newly placed tube on low wall suction to evaluate for any post procedure complication. The patient tolerated the procedure well, there is no immediate complication. IMPRESSION: Successful placement of a 20 French pull through gastrostomy tube. Electronically Signed   By: Jacqulynn Cadet M.D.   On: 10/13/2016 10:37        Scheduled Meds: . benazepril  40 mg Oral q morning - 10a  . Chlorhexidine Gluconate Cloth  6 each Topical Daily  . docusate  100 mg Oral BID  . feeding supplement (ENSURE ENLIVE)  237 mL Oral BID BM  . insulin aspart  0-9 Units Subcutaneous Q4H  . mouth rinse  15 mL Mouth Rinse BID  . mirtazapine  30 mg Oral QHS  . senna  2 tablet Oral Daily  . sodium chloride flush  10-40 mL Intracatheter Q12H   Continuous Infusions: . dextrose 20 mL/hr at 10/12/16 1552  . feeding supplement (JEVITY 1.2 CAL)    . heparin 900 Units/hr (10/13/16 2102)     LOS: 15 days     Cordelia Poche, MD [facility] 10/14/2016, 9:28 AM Pager: [phone]  If 7PM-7AM, please contact night-coverage [email or web address] Password [facility] 10/14/2016, 9:28 AM

## 2016-10-14 NOTE — Plan of Care (Signed)
Problem: Education: Goal: Knowledge of [facility] Education information/materials will improve Outcome: Progressing POC reviewed with pt.   

## 2016-10-14 NOTE — Progress Notes (Signed)
Nutrition Brief Note  Chart reviewed. Pt now transitioning to comfort care.  No further nutrition interventions warranted at this time.  Please re-consult as needed.   Hao Dion A. Karen Huhta, RD, LDN, CDE Pager: [phone] After hours Pager: [phone]  

## 2016-10-14 NOTE — Progress Notes (Signed)
Palliative Medicine RN Note: Rec'd call from daughter Stephen Bates [phone]) that family now wants patient to go to inpt hospice at [place] instead of going home. He got PEG yesterday.  Saw pt. He woke only with loud voice and touch. He can answer questions with one or two words before speech devolves to unintelligible mumbling. No feeding hooked up yet. He is on a heparin gtt and D10 continuous. Percutaneous drain for cholecystitis remains in place. He is still weeping in all extremities per floor staff.  Stephen Bates prognosis is very poor. If D10 is stopped and PEG is not used, prognosis would be hours to days. Even if PEG is used, prognosis is likely only 2 weeks due to high risk of aspiration and poor prognosis r/t problems other than feeding. Because TF haven't been initiated yet, we don't know if he will even tolerate the TF.   Placed order to allow hospice to eval for inpt admission. Spoke w [place] with hospice [phone]). She will come evaluate for appropriateness.  Long discussion w daughter Stephen Bates about using the PEG vs not using it and the potential that has to impact hospice location and the likelihood that it will not increase quality but it may prolong the dying process. Stephen Bates states that she feels that it is kindest to not use the tube. However, she also states that pt and his wife have been together 84 years and that she wants wife to make that decision. She will talk to family and call me back.   Marjie Skiff Deshonna Trnka, RN, BSN, [facility] 10/14/2016 9:34 AM Cell [phone] 8:00-4:00 Monday-Friday Office [phone]

## 2016-10-14 NOTE — Progress Notes (Signed)
Palliative Medicine RN Note: Rec'd call from [facility] with [facility] hospice. Family and hospice are requesting transfer at 0900 tomorrow; paged Dr Lonny Prude with update/request and left message for SW [facility].  Please fax d/c orders to [phone]. RN can call report to [phone]. No prescriptions are needed.   Marjie Skiff Faith Patricelli, RN, BSN, [facility] 10/14/2016 3:12 PM Cell [phone] 8:00-4:00 Monday-Friday Office [phone]

## 2016-10-14 NOTE — Progress Notes (Signed)
Palliative Medicine RN Note: Rec'd call from pt's daughter [name]. The family has decided that they do not want to use the PEG for tube feedings and would like the pt to go to inpt hospice at [place]. I updated Dr Lonny Prude, charge RN, [facility], and Dr Hilma Favors, who adjusted orders.  Plan to leave D10 running until transfer to hospice. Faxed updates to [place]. Family and hospice will be ready for transfer tomorrow.  Marjie Skiff Omer Monter, RN, BSN, [facility] 10/14/2016 1:21 PM Cell [phone] 8:00-4:00 Monday-Friday Office [phone]

## 2016-10-14 NOTE — Progress Notes (Signed)
ANTICOAGULATION CONSULT NOTE - INITIAL CONSULT   Pharmacy Consult for Heparin to warfarin Indication: atrial fibrillation and acute CVA  No Known Allergies  Patient Measurements: Height: 5\' 5"  (165.1 cm) Weight: 124 lb 12.5 oz (56.6 kg) IBW/kg (Calculated) : 61.5   Vital Signs: Temp: 97.8 F (36.6 C) (04/10 0528) BP: 140/59 (04/10 0528) Pulse Rate: 81 (04/10 0528)  Labs:  Recent Labs  10/12/16 0504 10/12/16 1407 10/13/16 0535 10/14/16 0811  HGB 11.3*  --  11.1* 11.0*  HCT 35.3*  --  34.9* 35.0*  PLT 251  --  260 248  LABPROT  --   --  15.5*  --   INR  --   --  1.22  --   HEPARINUNFRC 0.54 0.59 0.37 0.52  CREATININE 0.47*  --  0.51*  --     Estimated Creatinine Clearance: 52.1 mL/min (A) (by C-G formula based on SCr of 0.51 mg/dL (L)).   Assessment: 83 yo male admitted and found to have atrial fibrillation and acute CVA. Warfarin was started with a heparin bridge 10/04/16. Heparin was d/c'd with an INR of 2 on 10/09/16. INR dropped subtherapeutic on 4/6 AM.   Transitioned to IV heparin due to need to hold warfarin in anticipation of GTube placement. Heparin level just above goal range this morning at 0.52. Last INR 1.22 on 4/9. Last warfarin dose on 4/6 inpatient. Pharmacy now consulted to restart warfarin/heparin bridging.  CBC stable, no bleeding noted.  Goal of Therapy:  Heparin goal 0.3-0.5 INR 2 - 3 Monitor platelets by anticoagulation protocol: Yes   Plan:  Reduce heparin infusion to 850 units/hr Daily heparin levels Give warfarin 5 mg po x 1 Monitor daily INR, CBC, clinical course, s/sx of bleed, PO intake, DDI   Thank you for allowing [place] to participate in this patients care.  Jens Som, PharmD Clinical phone for 10/14/2016 from 7a-3:30p: [phone] If after 3:30p, please call main pharmacy at: [phone] 10/14/2016 10:42 AM

## 2016-10-14 NOTE — Progress Notes (Signed)
Referring Physician(s): Dr Georgena Spurling  Supervising Physician: Jacqulynn Cadet  Patient Status:  [facility] - In-pt  Chief Complaint:  G tube placed 4/9 Dysphagia Long term care  Subjective:  G tube intact; In use Site is clean and dry afeb   Allergies: Patient has no known allergies.  Medications: Prior to Admission medications   Medication Sig Start Date End Date Taking? Authorizing Provider  acetaminophen (TYLENOL) 325 MG tablet Take 650 mg by mouth once as needed for mild pain or moderate pain.   Yes Historical Provider, MD  amoxicillin-clavulanate (AUGMENTIN) 875-125 MG tablet Take 1 tablet by mouth 2 (two) times daily. 09/24/16  Yes Asencion Noble, MD  aspirin EC 81 MG tablet Take 81 mg by mouth daily.   Yes Historical Provider, MD  benazepril (LOTENSIN) 40 MG tablet Take 1 tablet by mouth every morning.  10/04/13  Yes Historical Provider, MD  Cholecalciferol (VITAMIN D PO) Take 1 capsule by mouth daily.   Yes Historical Provider, MD  docusate sodium (COLACE) 100 MG capsule Take 100 mg by mouth daily as needed for mild constipation.    Yes Historical Provider, MD  metFORMIN (GLUCOPHAGE-XR) 500 MG 24 hr tablet Take 500 mg by mouth 2 (two) times daily.  08/22/15  Yes Historical Provider, MD  mirtazapine (REMERON) 30 MG tablet Take 30 mg by mouth at bedtime.   Yes Historical Provider, MD  Multiple Vitamins-Minerals (CENTRUM SILVER ADULT 50+ PO) Take 1 tablet by mouth every morning.   Yes Historical Provider, MD  oseltamivir (TAMIFLU) 75 MG capsule Take 75 mg by mouth 2 (two) times daily. 5 day course 09/27/16  Yes Historical Provider, MD  polyethylene glycol (MIRALAX / GLYCOLAX) packet Take 17 g by mouth daily as needed for mild constipation.    Yes Historical Provider, MD  potassium chloride (K-DUR) 10 MEQ tablet Take 1 tablet (10 mEq total) by mouth 2 (two) times daily. 09/24/16  Yes Asencion Noble, MD  prochlorperazine (COMPAZINE) 10 MG tablet Take by mouth every 8 (eight) hours as needed  for nausea or vomiting.  09/26/16  Yes Historical Provider, MD  triamcinolone cream (KENALOG) 0.1 % Apply 1 application topically 2 (two) times daily. To head 09/09/16  Yes Historical Provider, MD     Vital Signs: BP (!) 140/59 (BP Location: Right Arm)   Pulse 81   Temp 97.8 F (36.6 C)   Resp (!) 22   Ht 5\' 5"  (1.651 m)   Wt 124 lb 12.5 oz (56.6 kg)   SpO2 98%   BMI 20.76 kg/m   Physical Exam  Abdominal: Soft. Bowel sounds are normal.  Skin: Skin is warm and dry.  Site of G tube clean and dry NT no bleeding G tube In use   Nursing note and vitals reviewed.   Imaging: Ir Gastrostomy Tube Mod Sed  Result Date: 10/13/2016 INDICATION: 83 year old male with advanced dementia and protein calorie malnutrition. Percutaneous gastrostomy tube is warranted for enteral nutrition. EXAM: Fluoroscopically guided placement of percutaneous pull-through gastrostomy tube Interventional Radiologist:  Criselda Peaches, MD MEDICATIONS: 2 g Ancef; Antibiotics were administered within 1 hour of the procedure. ANESTHESIA/SEDATION: Versed 2mg  IV; Fentanyl 75 mcg IV Moderate Sedation Time:  50 minutes The patient was continuously monitored during the procedure by the interventional radiology nurse under my direct supervision. CONTRAST:  48mL ISOVUE-300 IOPAMIDOL (ISOVUE-300) INJECTION 61% FLUOROSCOPY TIME:  Fluoroscopy Time: 11 minutes 54 seconds (43 mGy). COMPLICATIONS: None immediate. PROCEDURE: Informed written consent was obtained from the patient after  a thorough discussion of the procedural risks, benefits and alternatives. All questions were addressed. Maximal Sterile Barrier Technique was utilized including caps, mask, sterile gowns, sterile gloves, sterile drape, hand hygiene and skin antiseptic. A timeout was performed prior to the initiation of the procedure. Maximal barrier sterile technique utilized including caps, mask, sterile gowns, sterile gloves, large sterile drape, hand hygiene, and  chlorhexadine skin prep. An angled catheter was advanced over a wire under fluoroscopic guidance through the nose, down the esophagus and into the body of the stomach. The stomach was then insufflated with several 100 ml of air. Fluoroscopy confirmed location of the gastric bubble, as well as inferior displacement of the barium stained colon. Under direct fluoroscopic guidance, a single T-tack was placed, and the anterior gastric wall drawn up against the anterior abdominal wall. Percutaneous access was then obtained into the mid gastric body with an 18 gauge sheath needle. Aspiration of air, and injection of contrast material under fluoroscopy confirmed needle placement. An Amplatz wire was advanced in the gastric body and the access needle exchanged for a 9-French vascular sheath. A snare device was advanced through the vascular sheath and an Amplatz wire advanced through the angled catheter. The Amplatz wire was successfully snared and this was pulled up through the esophagus and out the mouth. A 20-French Alinda Dooms MIC-PEG tube was then connected to the snare and pulled through the mouth, down the esophagus, into the stomach and out to the anterior abdominal wall. Hand injection of contrast material confirmed intragastric location. The T-tack retention suture was then cut. The pull through peg tube was then secured with the external bumper and capped. The patient will be observed for several hours with the newly placed tube on low wall suction to evaluate for any post procedure complication. The patient tolerated the procedure well, there is no immediate complication. IMPRESSION: Successful placement of a 20 French pull through gastrostomy tube. Electronically Signed   By: Jacqulynn Cadet M.D.   On: 10/13/2016 10:37    Labs:  CBC:  Recent Labs  10/11/16 0543 10/12/16 0504 10/13/16 0535 10/14/16 0811  WBC 8.9 11.3* 10.2 9.9  HGB 11.0* 11.3* 11.1* 11.0*  HCT 34.6* 35.3* 34.9* 35.0*  PLT 280  251 260 248    COAGS:  Recent Labs  10/09/16 0500 10/10/16 0531 10/11/16 0543 10/13/16 0535  INR 2.00 1.88 1.69 1.22    BMP:  Recent Labs  10/08/16 0603 10/11/16 0543 10/12/16 0504 10/13/16 0535  NA 138 136 135 133*  K 2.9* 2.6* 3.1* 3.2*  CL 100* 95* 96* 97*  CO2 30 31 31 27   GLUCOSE 176* 139* 142* 142*  BUN <5* 5* 5* <5*  CALCIUM 8.8* 9.2 9.0 9.0  CREATININE 0.51* 0.43* 0.47* 0.51*  GFRNONAA >60 >60 >60 >60  GFRAA >60 >60 >60 >60    LIVER FUNCTION TESTS:  Recent Labs  09/29/16 1500 10/01/16 0444 10/02/16 0300 10/04/16 0514  BILITOT 1.9* 0.5 0.5 0.5  AST 16 19 21 20   ALT 18 14* 13* 15*  ALKPHOS 61 55 57 67  PROT 6.4* 5.2* 5.2* 5.6*  ALBUMIN 2.4* 1.7* 1.7* 1.7*    Assessment and Plan:  Percutaneous gastric tube placed 4/9 In use now Must remain  In place 5-6 weeks before removal  Electronically Signed: Dashan Chizmar A 10/14/2016, 10:48 AM   I spent a total of 15 Minutes at the the patient's bedside AND on the patient's hospital floor or unit, greater than 50% of which was counseling/coordinating care  for G tube

## 2016-10-15 LAB — GLUCOSE, CAPILLARY
Glucose-Capillary: 144 mg/dL — ABNORMAL HIGH (ref 65–99)
Glucose-Capillary: 133 mg/dL — ABNORMAL HIGH (ref 65–99)

## 2016-10-15 MED ORDER — ONDANSETRON HCL 4 MG/5ML PO SOLN: 4.0000 mg | Freq: Three times a day (TID) | ORAL | Status: AC | PRN

## 2016-10-15 MED ORDER — HEPARIN SOD (PORK) LOCK FLUSH 100 UNIT/ML IV SOLN
250.0000 [IU] | Freq: Once | INTRAVENOUS | Status: AC
  Administered 2016-10-15: 250 [IU] via INTRAVENOUS
  Filled 2016-10-15: qty 2.5

## 2016-10-15 MED ORDER — HEPARIN SOD (PORK) LOCK FLUSH 100 UNIT/ML IV SOLN
250.0000 [IU] | INTRAVENOUS | Status: AC | PRN
Start: 2016-10-15 — End: 2016-10-15
  Administered 2016-10-15: 250 [IU]

## 2016-10-15 MED ORDER — DOCUSATE SODIUM 50 MG/5ML PO LIQD: 100.0000 mg | Freq: Two times a day (BID) | ORAL | Status: AC

## 2016-10-15 MED ORDER — MORPHINE SULFATE (CONCENTRATE) 10 MG/0.5ML PO SOLN: 5.0000 mg | ORAL | Status: AC | PRN

## 2016-10-15 MED ORDER — LORAZEPAM 2 MG/ML PO CONC: 1.0000 mg | ORAL | Status: AC | PRN

## 2016-10-15 NOTE — Progress Notes (Signed)
Patient will discharge to [facility] Anticipated discharge date: 4/11 Family notified: dtr Transportation by [facility]- called at 9:15am  CSW signing off.  Jorge Ny, LCSW Clinical Social Worker [phone]

## 2016-10-15 NOTE — Consult Note (Signed)
   [facility] CM Inpatient Consult   10/15/2016  RISHITH SIDDOWAY [birth year] [id number]   Follow up:  Confirmed that the patient will discharge to [facility]. No [facility] Care Management needs.  Natividad Brood, RN BSN [facility] Liaison  [phone] business mobile phone Toll free office [phone]

## 2016-10-15 NOTE — Discharge Summary (Signed)
Physician Discharge Summary  Stephen Bates YKD:[id number] DOB: 01/20/1927 DOA: 09/29/2016  PCP: Asencion Noble, MD  Admit date: 09/29/2016 Discharge date: 10/15/2016  Admitted From: Home Disposition: Residential hospice  Recommendations for Outpatient Follow-up:  1. Patient being discharged to residential hospice for end-of-life care.   Discharge Condition: Guarded, expected to decline and going to residential hospice for end-of-life care. CODE STATUS: DNR/DNI Diet recommendation: Patient has a PEG tube. After discussing with family, may consider oral comfort feeds of choice.  Brief/Interim Summary:  Admission HPI written by Merrie Roof, MD   CHIEF COMPLAINT:  SOB  HISTORY OF PRESENT ILLNESS:   83 year old male with PMH significant for DM, bedbound, dementia, prostate Ca, and CVA. He was also recently admitted 3/14 > 3/21 to [facility] for cholecystitis treated with IV antibiotics (Zosyn) and cystostomy placement. Hospital course complicated by PAF, which resolved spontaneously. He was discharged to home with home health nursing. 3/26 he presented to ED after being found unresponsive at home. He remained unresponsive in ED and was intubated for airway protection. He was also started on norepinephrine for shock. CXR concerning for multifocal PNA. He was transferred to [facility] for ICU care.     Hospital course:  Patient was admitted to the ICU service and intubated on 3/26. He was treated for septic shock and acute respiratory failure secondary to HCAP. He was treated with Vancomycin and Fortaz for the pneumonia and pressors for shock. He was managed with ventilator support until he was successfully extubated on 3/29. He was transferred to hospitalist service on 10/03/2016. He was found to have an acute CVA with deficits including right sided weakness and expressive aphasia. He was also found to have new atrial fibrillation. He was started on coumadin with a heparin bridge.  Throughout admission, patient was altered, likely initially secondary to septic shock and later, his CVA complicating his mental status. Patient had very poor oral intake with concerns for aspiration and discussions were made for a percutaneous gastrostomy tube. This was placed on 4/9 to be used on 4/10, when family decided to make patient full comfort care and to not use the gastrostomy tube for feedings. Arrangements were made for patient to transfer to residential hospice at discharge.  Patient has a PICC line, PEG tube, Foley catheter, cholecystostomy tube.  Discharge Diagnoses:  Principal Problem:   Severe sepsis with septic shock (HCC) Active Problems:   Mild dementia   Vascular parkinsonism (HCC)   Malnutrition of moderate degree   History of right bundle branch block   Diabetes ([place])   Stroke ([place]) history   HCAP (healthcare-associated pneumonia)   Septic shock (HCC)   Acute respiratory failure with hypoxia (HCC)   Encephalopathy   Pleural effusion   Acute CVA (cerebrovascular accident) [facility])   Palliative care encounter   Goals of care, counseling/discussion   Decreased appetite    Discharge Instructions  Discharge Instructions    Call MD for:  difficulty breathing, headache or visual disturbances    Complete by:  As directed    Call MD for:  persistant nausea and vomiting    Complete by:  As directed    Call MD for:  redness, tenderness, or signs of infection (pain, swelling, redness, odor or green/yellow discharge around incision site)    Complete by:  As directed    Call MD for:  severe uncontrolled pain    Complete by:  As directed    Call MD for:  temperature >100.4  Complete by:  As directed    Discharge instructions    Complete by:  As directed    Patient currently has a PEG tube. May consider comfort feeds of choice by mouth, after discussion with and consent from family.   Increase activity slowly    Complete by:  As directed      Allergies as of  10/15/2016   No Known Allergies     Medication List    STOP taking these medications   acetaminophen 325 MG tablet Commonly known as:  TYLENOL   amoxicillin-clavulanate 875-125 MG tablet Commonly known as:  AUGMENTIN   aspirin EC 81 MG tablet   benazepril 40 MG tablet Commonly known as:  LOTENSIN   CENTRUM SILVER ADULT 50+ PO   docusate sodium 100 MG capsule Commonly known as:  COLACE Replaced by:  docusate 50 MG/5ML liquid   metFORMIN 500 MG 24 hr tablet Commonly known as:  GLUCOPHAGE-XR   oseltamivir 75 MG capsule Commonly known as:  TAMIFLU   polyethylene glycol packet Commonly known as:  MIRALAX / GLYCOLAX   potassium chloride 10 MEQ tablet Commonly known as:  K-DUR   prochlorperazine 10 MG tablet Commonly known as:  COMPAZINE   triamcinolone cream 0.1 % Commonly known as:  KENALOG   VITAMIN D PO     TAKE these medications   docusate 50 MG/5ML liquid Commonly known as:  COLACE Place 10 mLs (100 mg total) into feeding tube 2 (two) times daily. Replaces:  docusate sodium 100 MG capsule   LORazepam 2 MG/ML concentrated solution Commonly known as:  ATIVAN Place 0.5 mLs (1 mg total) into feeding tube every 4 (four) hours as needed for anxiety.   mirtazapine 30 MG tablet Commonly known as:  REMERON Take 30 mg by mouth at bedtime.   morphine CONCENTRATE 10 MG/0.5ML Soln concentrated solution Place 0.25 mLs (5 mg total) into feeding tube every 2 (two) hours as needed for moderate pain, severe pain, anxiety or shortness of breath.   ondansetron 4 MG/5ML solution Commonly known as:  ZOFRAN Place 5 mLs (4 mg total) into feeding tube every 8 (eight) hours as needed for nausea or vomiting.      Follow-up Information    Residential Hospice Follow up.   Why:  Patient being discharged to residential hospice for end-of-life care.         No Known Allergies  Consultations:  Critical care  Interventional radiology  Palliative  care   Procedures/Studies: Ct Head Wo Contrast  Result Date: 10/03/2016 CLINICAL DATA:  Acute stroke EXAM: CT HEAD WITHOUT CONTRAST TECHNIQUE: Contiguous axial images were obtained from the base of the skull through the vertex without intravenous contrast. COMPARISON:  None. FINDINGS: Brain: Large area of low attenuation with effacement of the normal gray-white differentiation in the left parietal lobe most concerning for an acute or subacute cerebral infarct. No evidence of acute hemorrhage, extra-axial collection, ventriculomegaly, or mass effect. Generalized cerebral atrophy. Periventricular white matter low attenuation likely secondary to microangiopathy. Vascular: Cerebrovascular atherosclerotic calcifications are noted. Skull: Negative for fracture or focal lesion. Sinuses/Orbits: Visualized portions of the orbits are unremarkable. Mild right maxillary sinus mucosal thickening. Small right mastoid effusion. Other: None. IMPRESSION: 1. Acute versus subacute nonhemorrhagic left parietal lobe (left MCA territory) infarct. Electronically Signed   By: Kathreen Devoid   On: 10/03/2016 15:12   Ct Chest Wo Contrast  Result Date: 10/01/2016 CLINICAL DATA:  Follow-up pleural effusions. Prior thoracentesis. Initial encounter. EXAM: CT CHEST WITHOUT CONTRAST  TECHNIQUE: Multidetector CT imaging of the chest was performed following the standard protocol without IV contrast. COMPARISON:  Chest radiograph performed earlier today at 7:04 p.m. FINDINGS: Cardiovascular: Diffuse coronary artery calcifications are seen. The heart remains normal in size. The left PICC is noted ending about the proximal right atrium. Scattered calcification is seen along the thoracic aorta and proximal great vessels. Mediastinum/Nodes: Visualized mediastinal nodes are borderline normal in size. Trace pericardial fluid remains within normal limits. The thyroid gland are grossly unremarkable. No axillary lymphadenopathy is seen. Lungs/Pleura:  Moderate to large right and small left pleural effusions are noted, with partial consolidation of both lower lung lobes. Mild peripheral airspace opacities are noted bilaterally, possibly reflecting a mild infectious process. No pneumothorax is seen. No masses are identified. Upper Abdomen: A cholecystostomy tube is noted. The gallbladder is decompressed and not well characterized. A 2.9 cm cystic focus is noted along the anterior aspect of the liver. A calcified granuloma is seen within the liver. The spleen is grossly unremarkable. Trace ascites is seen tracking about the liver and spleen. The visualized portions of the pancreas, adrenal glands and kidneys are within normal limits. Nonspecific perinephric stranding is noted bilaterally. Scattered calcification is seen along the proximal abdominal aorta and its branches. Musculoskeletal: No acute osseous abnormalities are identified. Anterior bridging osteophytes are seen along the thoracic spine. The patient is status post vertebroplasty along the lower thoracic spine, at T11-L1. The visualized musculature is unremarkable in appearance. IMPRESSION: 1. Moderate to large right and small left pleural effusions, with partial consolidation of both lower lung lobes. Mild peripheral airspace opacities seen bilaterally, possibly reflecting a mild infectious process. 2. Trace ascites noted tracking about the liver and spleen. 3. Diffuse coronary artery calcifications seen. 4. Scattered aortic atherosclerosis. 5. 2.9 cm cystic focus along the anterior aspect of the liver. This is nonspecific but may reflect a cyst. Electronically Signed   By: Garald Balding M.D.   On: 10/01/2016 22:24   Ct Abdomen Pelvis W Contrast  Result Date: 09/17/2016 CLINICAL DATA:  Right-sided abdominal pain with history of prostate cancer EXAM: CT ABDOMEN AND PELVIS WITH CONTRAST TECHNIQUE: Multidetector CT imaging of the abdomen and pelvis was performed using the standard protocol following  bolus administration of intravenous contrast. CONTRAST:  74mL ISOVUE-300 IOPAMIDOL (ISOVUE-300) INJECTION 61%, 181mL ISOVUE-300 IOPAMIDOL (ISOVUE-300) INJECTION 61% COMPARISON:  08/24/2015, 05/22/2015 FINDINGS: Lower chest: Small right-sided pleural effusion. Atelectasis at the left lung base. Partial consolidation in the right lower lobe, atelectasis versus infiltrate. Coronary artery calcifications. No large pericardial effusion. Hepatobiliary: Mild intra hepatic biliary dilatation. There are multiple calcified stones within the gallbladder lumen. There appears to be gallbladder wall thickening. There is edema and soft tissue stranding in the right upper quadrant. Extrahepatic bile duct does not appear dilated. Pancreas: No peripancreatic inflammation. A cystic density at the pancreatic head measures 16 x 13 mm and appears grossly unchanged. Spleen: Normal in size without focal abnormality. Adrenals/Urinary Tract: Subcentimeter hypodense lesions in the kidneys, too small to further characterize. No hydronephrosis. The bladder is enlarged. Stomach/Bowel: The stomach is nondilated. No dilated small bowel. Appendix normal. Focal wall thickening and inflammation of the hepatic flexure. Moderate stool throughout the colon. Mild wall thickening of the rectum Vascular/Lymphatic: Non aneurysmal aorta. Atherosclerotic calcifications. No grossly enlarged lymph nodes. Reproductive: Multiple seed implants in the prostate. Other: No free air or free fluid. Musculoskeletal: Vertebral augmentation at T11, T12, and L1 as before. Heterogenous sclerosis and lucency within the bilateral femoral heads does not  appear significantly changed. IMPRESSION: 1. Multiple calcified gallstones. There is inflammatory process in the right upper quadrant of the abdomen as evidenced by soft tissue stranding, edema, and focal wall thickening of the hepatic flexure. Suspect that the changes are secondary to cholecystitis with reactive wall  thickening of adjacent hepatic flexure. 2. Stable 16 mm cystic lesion at the head of the pancreas 3. Mild rectal wall thickening suggestive of proctitis 4. Small right-sided pleural effusion with atelectasis or infiltrate at the right lower lobe. Electronically Signed   By: Donavan Foil M.D.   On: 09/17/2016 21:44   Ir Gastrostomy Tube Mod Sed  Result Date: 10/13/2016 INDICATION: 83 year old male with advanced dementia and protein calorie malnutrition. Percutaneous gastrostomy tube is warranted for enteral nutrition. EXAM: Fluoroscopically guided placement of percutaneous pull-through gastrostomy tube Interventional Radiologist:  Criselda Peaches, MD MEDICATIONS: 2 g Ancef; Antibiotics were administered within 1 hour of the procedure. ANESTHESIA/SEDATION: Versed 2mg  IV; Fentanyl 75 mcg IV Moderate Sedation Time:  50 minutes The patient was continuously monitored during the procedure by the interventional radiology nurse under my direct supervision. CONTRAST:  50mL ISOVUE-300 IOPAMIDOL (ISOVUE-300) INJECTION 61% FLUOROSCOPY TIME:  Fluoroscopy Time: 11 minutes 54 seconds (43 mGy). COMPLICATIONS: None immediate. PROCEDURE: Informed written consent was obtained from the patient after a thorough discussion of the procedural risks, benefits and alternatives. All questions were addressed. Maximal Sterile Barrier Technique was utilized including caps, mask, sterile gowns, sterile gloves, sterile drape, hand hygiene and skin antiseptic. A timeout was performed prior to the initiation of the procedure. Maximal barrier sterile technique utilized including caps, mask, sterile gowns, sterile gloves, large sterile drape, hand hygiene, and chlorhexadine skin prep. An angled catheter was advanced over a wire under fluoroscopic guidance through the nose, down the esophagus and into the body of the stomach. The stomach was then insufflated with several 100 ml of air. Fluoroscopy confirmed location of the gastric bubble, as  well as inferior displacement of the barium stained colon. Under direct fluoroscopic guidance, a single T-tack was placed, and the anterior gastric wall drawn up against the anterior abdominal wall. Percutaneous access was then obtained into the mid gastric body with an 18 gauge sheath needle. Aspiration of air, and injection of contrast material under fluoroscopy confirmed needle placement. An Amplatz wire was advanced in the gastric body and the access needle exchanged for a 9-French vascular sheath. A snare device was advanced through the vascular sheath and an Amplatz wire advanced through the angled catheter. The Amplatz wire was successfully snared and this was pulled up through the esophagus and out the mouth. A 20-French Alinda Dooms MIC-PEG tube was then connected to the snare and pulled through the mouth, down the esophagus, into the stomach and out to the anterior abdominal wall. Hand injection of contrast material confirmed intragastric location. The T-tack retention suture was then cut. The pull through peg tube was then secured with the external bumper and capped. The patient will be observed for several hours with the newly placed tube on low wall suction to evaluate for any post procedure complication. The patient tolerated the procedure well, there is no immediate complication. IMPRESSION: Successful placement of a 20 French pull through gastrostomy tube. Electronically Signed   By: Jacqulynn Cadet M.D.   On: 10/13/2016 10:37   Ir Perc Cholecystostomy  Result Date: 09/22/2016 INDICATION: Acute cholecystitis and need for percutaneous cholecystostomy tube has the patient is not a candidate for operative cholecystectomy at this time. EXAM: CHOLECYSTOSTOMY CONTRAST:  10  mL Isovue-300 MEDICATIONS: No additional medications. ANESTHESIA/SEDATION: Moderate (conscious) sedation was employed during this procedure. A total of Versed 2.0 mg and Fentanyl 100 mcg was administered intravenously. Moderate  Sedation Time: 30 minutes. The patient's level of consciousness and vital signs were monitored continuously by radiology nursing throughout the procedure under my direct supervision. FLUOROSCOPY TIME:  Fluoroscopy Time: 1 minutes.  3.6 mGy. COMPLICATIONS: None immediate. PROCEDURE: Informed written consent was obtained from the patient after a thorough discussion of the procedural risks, benefits and alternatives. All questions were addressed. Maximal Sterile Barrier Technique was utilized including caps, mask, sterile gowns, sterile gloves, sterile drape, hand hygiene and skin antiseptic. A timeout was performed prior to the initiation of the procedure. Ultrasound was used to localize the gallbladder. Under direct ultrasound guidance, a 21 gauge needle was advanced into the gallbladder lumen. After aspiration of bile, contrast injection was performed under fluoroscopy. A guidewire was then advanced into the gallbladder lumen and a transitional dilator place. The percutaneous tract was dilated over a guidewire and a 10 French drainage catheter advanced into the gallbladder lumen. A bile sample was aspirated and sent for culture analysis. Catheter positioning was confirmed by fluoroscopy after injection with contrast material. The catheter was secured at the skin with a Prolene retention suture and StatLock device. The catheter was flushed and attached to a gravity drainage bag. FINDINGS: After access of the gallbladder, aspiration yielded purulent appearing bile. A sample was sent for culture. After placement of the cholecystostomy tube, there is return of thick, purulent appearing fluid. IMPRESSION: Percutaneous cholecystostomy tube placement for drainage of the gallbladder. There was return of purulent bile from the gallbladder lumen. A sample of this fluid was sent for culture analysis. The cholecystostomy tube was attached to gravity bag drainage. Electronically Signed   By: Aletta Edouard M.D.   On: 09/22/2016  16:33   Dg Chest Port 1 View  Result Date: 10/09/2016 CLINICAL DATA:  Healthcare associated pneumonia, history of diabetes, previous CVA, former smoker. EXAM: PORTABLE CHEST 1 VIEW COMPARISON:  Portable chest x-ray of October 06, 2016 FINDINGS: The lungs remain mildly hypoinflated but on the left the aeration has improved slightly. There are bibasilar densities consistent with atelectasis or pneumonia. There are bilateral pleural effusions. The cardiac silhouette is mildly enlarged. The central pulmonary vascularity is prominent. The left-sided PICC line tip projects over the distal third of the SVC. IMPRESSION: Bibasilar atelectasis or pneumonia with pleural effusions, relatively stable. Stable enlargement of the cardiac silhouette with central pulmonary vascular prominence. Thoracic aortic atherosclerosis. Electronically Signed   By: David  Martinique M.D.   On: 10/09/2016 07:06   Dg Chest Port 1 View  Result Date: 10/06/2016 CLINICAL DATA:  Persistent cough and congestion. EXAM: PORTABLE CHEST 1 VIEW COMPARISON:  10/03/2016 . FINDINGS: Left PICC line noted with tip at cavoatrial junction. Cardiomegaly. Bilateral pulmonary infiltrates and pleural effusions noted progressed from prior exam of 10/03/2016. CHF and bilateral pneumonia could present this fashion. Low lung volumes. Drainage catheter noted over the right upper quadrant in stable position. IMPRESSION: 1. Left PICC line noted with tip at cavoatrial junction. Drainage catheter right upper quadrant stable position. 2. Cardiomegaly with bibasilar pulmonary infiltrates/ edema and bilateral pleural effusions. Findings have progressed from prior exam of 10/03/2016. Low lung volumes . Electronically Signed   By: Marcello Moores  Register   On: 10/06/2016 17:19   Dg Chest Port 1 View  Result Date: 10/03/2016 CLINICAL DATA:  Acute respiratory failure EXAM: PORTABLE CHEST 1 VIEW COMPARISON:  10/02/2016 FINDINGS: Cardiac shadow is within normal limits. Left-sided PICC line  is again seen and stable. Slight increase in the degree of left basilar atelectasis is noted with small effusion. The overall inspiratory effort is poor with crowding of the vascular markings. No acute bony abnormality is seen. IMPRESSION: Slight increase in the degree of left basilar atelectasis and effusion. The overall inspiratory effort is poor which may account for some these changes. Electronically Signed   By: Inez Catalina M.D.   On: 10/03/2016 07:21   Dg Chest Port 1 View  Result Date: 10/02/2016 CLINICAL DATA:  Post right thoracentesis EXAM: PORTABLE CHEST 1 VIEW COMPARISON:  10/02/2016 FINDINGS: Cardiomediastinal silhouette is stable. Stable endotracheal tube position. Left arm PICC line is unchanged in position. Improvement in aeration in right base with resolved right pleural effusion. Minimal residual right basilar atelectasis. Persistent small left pleural effusion with left basilar atelectasis or infiltrate. No pulmonary edema. There is no pneumothorax. Cholecystostomy catheter in right upper quadrant is stable in position. IMPRESSION: Stable endotracheal tube position. Left arm PICC line is unchanged in position. Improvement in aeration in right base with resolved right pleural effusion. Minimal residual right basilar atelectasis. Persistent small left pleural effusion with left basilar atelectasis or infiltrate. No pulmonary edema. There is no pneumothorax. Electronically Signed   By: Lahoma Crocker M.D.   On: 10/02/2016 15:06   Dg Chest Port 1 View  Result Date: 10/02/2016 CLINICAL DATA:  Pneumonia EXAM: PORTABLE CHEST 1 VIEW COMPARISON:  10/01/2016 FINDINGS: Cardiac shadow is stable. Endotracheal tube and left-sided PICC line are noted and stable. Bilateral pleural effusions are noted right greater than left with associated consolidation. The overall appearance is stable. Cholecystostomy tube is noted in the right upper quadrant. No bony abnormality is noted. IMPRESSION: Stable appearance of  the chest with bilateral effusions and bibasilar consolidation. Electronically Signed   By: Inez Catalina M.D.   On: 10/02/2016 07:32   Dg Chest Port 1 View  Result Date: 10/01/2016 CLINICAL DATA:  Confirm line placement. EXAM: PORTABLE CHEST 1 VIEW COMPARISON:  Chest radiograph October 01, 2016 FINDINGS: LEFT PICC distal tip projects in proximal RIGHT atrium. Endotracheal tube tip projects 3.4 cm above the carina. Nasogastric tube past proximal stomach, distal tip not imaged. RIGHT upper quadrant pigtail drainage catheter. Cardiac silhouette is likely mildly enlarged, predominately obscured by bilateral pleural effusions and patchy airspace opacities. Mediastinal silhouette is nonsuspicious, calcified aortic knob. No pneumothorax. Old RIGHT rib fractures. IMPRESSION: LEFT PICC distal tip projects in RIGHT atrium, recommend 1-2 cm of retraction. No apparent change in remaining life support lines. Similar bilateral pleural effusions and patchy airspace opacities. Electronically Signed   By: Elon Alas M.D.   On: 10/01/2016 19:25   Dg Chest Port 1 View  Result Date: 10/01/2016 CLINICAL DATA:  Respiratory failure. EXAM: PORTABLE CHEST 1 VIEW COMPARISON:  09/29/2016. FINDINGS: Interim advancement of NG tube, its tip is below left hemidiaphragm. Endotracheal tube in stable position Heart size stable. Persistent bilateral pulmonary infiltrates and bilateral pleural effusions. Low lung volumes. No interim change . No pneumothorax. Prior vertebroplasties. IMPRESSION: 1. Interim advancement of NG tube, its tip is below left hemidiaphragm. Endotracheal tube in stable position. 2. Persistent bilateral pulmonary infiltrates and bilateral pleural effusions, right side greater than left. Low lung volumes. No interim change. Electronically Signed   By: Marcello Moores  Register   On: 10/01/2016 06:55   Dg Chest Portable 1 View  Result Date: 09/29/2016 CLINICAL DATA:  NG tube verification EXAM: PORTABLE  CHEST 1 VIEW  COMPARISON:  09/29/2016 FINDINGS: Endotracheal tube 2.7 cm above the carina. Nasogastric tube with the tip projecting over the stomach, but the proximal port is above the esophagogastric junction. Recommend advancing the tube 10 cm. Bilateral small pleural effusions. Bilateral interstitial thickening. Right mid and lower lung airspace disease. Mild left lower lobe airspace disease. No pneumothorax. No acute osseous abnormality. IMPRESSION: 1. Endotracheal tube 2.7 cm above the carina. 2. Nasogastric tube with the tip projecting over the stomach, but the proximal port is above the esophagogastric junction. Recommend advancing the tube 10 cm. 3. Findings concerning for pulmonary edema versus multi lobar pneumonia. Electronically Signed   By: Kathreen Devoid   On: 09/29/2016 17:42   Dg Chest Port 1 View  Result Date: 09/29/2016 CLINICAL DATA:  Intubation. EXAM: PORTABLE CHEST 1 VIEW COMPARISON:  05/23/2015 . FINDINGS: Endotracheal tube tip noted at the carina. Proximal repositioning of 3 cm should be considered. NG tube noted with tip at the level of the lower esophagus/ upper stomach. Distal positioning of approximately 10 cm should be considered. Diffuse bilateral pulmonary infiltrates, right side greater than left. Bilateral pleural effusions. Cardiomegaly. Prior vertebroplasties. IMPRESSION: 1. Endotracheal tube tip noted at the carina. Proximal repositioning of approximately 3 cm should be considered. NG tube noted with tip at the level of the lower esophagus/upper stomach, distal position approximately 10 cm should be considered. 2. Diffuse bilateral pulmonary infiltrates right side greater than left. Bilateral pleural effusions. 3. Cardiomegaly. Critical Value/emergent results were called by telephone at the time of interpretation on 09/29/2016 at 5:01 pm to Dr. Merrily Pew , who verbally acknowledged these results. Electronically Signed   By: Marcello Moores  Register   On: 09/29/2016 17:05   Dg Abd Portable  1v  Result Date: 10/01/2016 CLINICAL DATA:  Orogastric tube placement EXAM: PORTABLE ABDOMEN - 1 VIEW COMPARISON:  Portable exam 1948 hours compared to 09/30/2016 FINDINGS: Tip of nasogastric tube projects over mid stomach. Bowel gas pattern normal. Bones demineralized with prior vertebroplasties at T11, T12 and L1. IMPRESSION: Tip of orogastric tube projects over mid stomach. Electronically Signed   By: Lavonia Dana M.D.   On: 10/01/2016 20:02   Dg Abd Portable 1v  Result Date: 09/30/2016 CLINICAL DATA:  83 year old male with orogastric tube placement. Subsequent encounter. EXAM: PORTABLE ABDOMEN - 1 VIEW COMPARISON:  09/30/2016. FINDINGS: Nasogastric tube has been advanced. Tip and side hole in the region of the left upper quadrant probably within the gastric fundus. Pigtail catheter right upper quadrant. Right femoral line tip just the right of the lower L5 vertebra. Cement augmentation T11, T12 and L1. Consolidation right lung. Nonspecific bowel gas pattern. IMPRESSION: Nasogastric tube tip and side hole left upper quadrant probably within the region of the gastric fundus. Electronically Signed   By: Genia Del M.D.   On: 09/30/2016 06:52   Dg Abd Portable 1v  Result Date: 09/30/2016 CLINICAL DATA:  Orogastric tube repositioning.  Initial encounter. EXAM: PORTABLE ABDOMEN - 1 VIEW COMPARISON:  Chest radiograph performed 09/29/2016, and CT of the abdomen and pelvis from 09/17/2016 FINDINGS: The patient's enteric tube is noted ending overlying the distal esophagus, on correlation with prior CT. The visualized bowel gas pattern is grossly unremarkable. A cholecystostomy tube is noted. No free intra-abdominal air is identified, though evaluation for free air is limited on a single supine view. No acute osseous abnormalities are seen. The patient is status post vertebroplasty at multiple levels along the lower thoracic spine. IMPRESSION: Enteric tube noted ending  overlying the distal esophagus, on  correlation with prior CT. This could be advanced at least 8 cm, as deemed clinically appropriate. Electronically Signed   By: Garald Balding M.D.   On: 09/30/2016 01:14      Subjective: Unresponsive. Barely opens eyes to call or touch.  Discharge Exam:  Vitals:   10/14/16 0528 10/14/16 1409 10/14/16 2137 10/15/16 0545  BP: (!) 140/59 (!) 167/86 (!) 151/77 (!) 162/74  Pulse: 81 99 88 79  Resp: (!) 22 (!) 21 18 16   Temp: 97.8 F (36.6 C) 98.2 F (36.8 C) 98.5 F (36.9 C)   TempSrc:  Oral    SpO2: 98% 98% 96% 98%  Weight: 56.6 kg (124 lb 12.5 oz)     Height:        General: Elderly male, frail, chronically ill looking, lying comfortably propped up in bed and does not appear in any distress. Cardiovascular: RRR, S1/S2 +, no rubs, no gallops Respiratory: Poor inspiratory effort. Reduced breath sounds in the bases but otherwise clear to auscultation. No increased work of breathing. Abdominal: Soft, NT, ND, bowel sounds +. Cholecystostomy drain + draining biliary material. PEG tube in place. Foley catheter in place. Extremities: no edema, no cyanosis CNS: Barely opens eyes to call or touch. Purposeful movements of right upper extremity. Nonverbal. Does not follow instructions.    The results of significant diagnostics from this hospitalization (including imaging, microbiology, ancillary and laboratory) are listed below for reference.     Microbiology: No results found for this or any previous visit (from the past 240 hour(s)).   Labs: BNP (last 3 results)  Recent Labs  09/29/16 1726  BNP 8,280.0*   Basic Metabolic Panel:  Recent Labs Lab 10/11/16 0543 10/12/16 0504 10/13/16 0535 10/14/16 1005  NA 136 135 133* 134*  K 2.6* 3.1* 3.2* 3.5  CL 95* 96* 97* 98*  CO2 31 31 27 28   GLUCOSE 139* 142* 142* 148*  BUN 5* 5* <5* <5*  CREATININE 0.43* 0.47* 0.51* 0.54*  CALCIUM 9.2 9.0 9.0 9.1  MG  --  1.8  --  1.7  PHOS  --   --   --  2.6   CBC:  Recent Labs Lab  10/10/16 0531 10/11/16 0543 10/12/16 0504 10/13/16 0535 10/14/16 0811  WBC 9.3 8.9 11.3* 10.2 9.9  HGB 11.1* 11.0* 11.3* 11.1* 11.0*  HCT 34.9* 34.6* 35.3* 34.9* 35.0*  MCV 99.1 100.0 99.7 100.0 100.6*  PLT 308 280 251 260 248   CBG:  Recent Labs Lab 10/14/16 1730 10/14/16 2015 10/14/16 2356 10/15/16 0407 10/15/16 0744  GLUCAP 157* 196* 155* 144* 133*   Urinalysis    Component Value Date/Time   COLORURINE YELLOW 09/29/2016 1441   APPEARANCEUR CLEAR 09/29/2016 1441   LABSPEC 1.017 09/29/2016 1441   PHURINE 6.0 09/29/2016 1441   GLUCOSEU 50 (A) 09/29/2016 1441   HGBUR NEGATIVE 09/29/2016 1441   BILIRUBINUR NEGATIVE 09/29/2016 1441   KETONESUR 80 (A) 09/29/2016 1441   PROTEINUR 30 (A) 09/29/2016 1441   UROBILINOGEN 2.0 (H) 01/06/2014 0830   NITRITE NEGATIVE 09/29/2016 1441   LEUKOCYTESUR TRACE (A) 09/29/2016 1441   No family at bedside to discuss.   Time coordinating discharge: Over 30 minutes  SIGNED:   Vernell Leep, MD, FACP, [place]. [facility] Pager [phone]  If 7PM-7AM, please contact night-coverage [email or web address] Password [email or web address] 10/15/2016, 9:05 AM

## 2016-10-15 NOTE — Progress Notes (Signed)
Palliative Medicine RN Note: Discussed with Dr Algis Liming and updated hospice. Will transfer once orders are in. Please feel free to call me with questions.  Marjie Skiff Triva Hueber, RN, BSN, [facility] 10/15/2016 8:45 AM Cell [phone] 8:00-4:00 Monday-Friday Office [phone]

## 2016-10-15 NOTE — Progress Notes (Signed)
Patient discharged to [facility] via transport, report called to facility.

## 2016-10-18 DIAGNOSIS — Z743 Need for continuous supervision: Secondary | ICD-10-CM | POA: Diagnosis not present

## 2016-10-18 DIAGNOSIS — R279 Unspecified lack of coordination: Secondary | ICD-10-CM | POA: Diagnosis not present

## 2016-10-28 ENCOUNTER — Other Ambulatory Visit: Payer: BLUE CROSS/BLUE SHIELD

## 2016-10-28 ENCOUNTER — Other Ambulatory Visit (HOSPITAL_COMMUNITY): Payer: Self-pay | Admitting: Interventional Radiology

## 2016-10-28 DIAGNOSIS — K810 Acute cholecystitis: Secondary | ICD-10-CM

## 2016-11-03 ENCOUNTER — Telehealth (HOSPITAL_COMMUNITY): Payer: Self-pay

## 2016-11-03 NOTE — Telephone Encounter (Signed)
Called to schedule tube exchange, pt just had a stroke in the hospital per wife and is unable to communicate that well. She would like for me to try and call back close to date tube change is due to schedule to give him time to get better. AW

## 2016-12-05 DEATH — deceased

## 2016-12-23 ENCOUNTER — Ambulatory Visit (HOSPITAL_COMMUNITY): Admission: RE | Admit: 2016-12-23 | Payer: BLUE CROSS/BLUE SHIELD | Source: Ambulatory Visit
